# Patient Record
Sex: Female | Born: 1935 | Race: Black or African American | Hispanic: No | Smoking: Never smoker
Health system: Southern US, Community
[De-identification: ages and names within clinical notes are randomized; demographics above are authoritative.]

## PROBLEM LIST (undated history)

## (undated) DIAGNOSIS — J309 Allergic rhinitis, unspecified: Secondary | ICD-10-CM

## (undated) DIAGNOSIS — N390 Urinary tract infection, site not specified: Secondary | ICD-10-CM

## (undated) DIAGNOSIS — D649 Anemia, unspecified: Secondary | ICD-10-CM

## (undated) DIAGNOSIS — R4701 Aphasia: Secondary | ICD-10-CM

## (undated) DIAGNOSIS — I639 Cerebral infarction, unspecified: Secondary | ICD-10-CM

## (undated) DIAGNOSIS — R197 Diarrhea, unspecified: Secondary | ICD-10-CM

## (undated) DIAGNOSIS — M6281 Muscle weakness (generalized): Secondary | ICD-10-CM

## (undated) DIAGNOSIS — R5383 Other fatigue: Secondary | ICD-10-CM

## (undated) DIAGNOSIS — A414 Sepsis due to anaerobes: Secondary | ICD-10-CM

## (undated) DIAGNOSIS — F32A Depression, unspecified: Secondary | ICD-10-CM

## (undated) DIAGNOSIS — I10 Essential (primary) hypertension: Secondary | ICD-10-CM

## (undated) DIAGNOSIS — D493 Neoplasm of unspecified behavior of breast: Secondary | ICD-10-CM

## (undated) DIAGNOSIS — E559 Vitamin D deficiency, unspecified: Secondary | ICD-10-CM

## (undated) DIAGNOSIS — F0280 Dementia in other diseases classified elsewhere without behavioral disturbance: Secondary | ICD-10-CM

## (undated) DIAGNOSIS — R0602 Shortness of breath: Secondary | ICD-10-CM

## (undated) DIAGNOSIS — R7881 Bacteremia: Secondary | ICD-10-CM

## (undated) DIAGNOSIS — F0390 Unspecified dementia without behavioral disturbance: Secondary | ICD-10-CM

## (undated) DIAGNOSIS — Z8673 Personal history of transient ischemic attack (TIA), and cerebral infarction without residual deficits: Secondary | ICD-10-CM

## (undated) DIAGNOSIS — M4628 Osteomyelitis of vertebra, sacral and sacrococcygeal region: Secondary | ICD-10-CM

## (undated) DIAGNOSIS — L89154 Pressure ulcer of sacral region, stage 4: Secondary | ICD-10-CM

## (undated) DIAGNOSIS — Z9289 Personal history of other medical treatment: Secondary | ICD-10-CM

## (undated) DIAGNOSIS — E785 Hyperlipidemia, unspecified: Secondary | ICD-10-CM

## (undated) DIAGNOSIS — I82409 Acute embolism and thrombosis of unspecified deep veins of unspecified lower extremity: Secondary | ICD-10-CM

## (undated) DIAGNOSIS — H409 Unspecified glaucoma: Secondary | ICD-10-CM

## (undated) DIAGNOSIS — E119 Type 2 diabetes mellitus without complications: Secondary | ICD-10-CM

## (undated) DIAGNOSIS — K5900 Constipation, unspecified: Secondary | ICD-10-CM

## (undated) DIAGNOSIS — F329 Major depressive disorder, single episode, unspecified: Secondary | ICD-10-CM

## (undated) DIAGNOSIS — J45909 Unspecified asthma, uncomplicated: Secondary | ICD-10-CM

## (undated) DIAGNOSIS — R5381 Other malaise: Secondary | ICD-10-CM

## (undated) DIAGNOSIS — E669 Obesity, unspecified: Secondary | ICD-10-CM

## (undated) DIAGNOSIS — A0472 Enterocolitis due to Clostridium difficile, not specified as recurrent: Secondary | ICD-10-CM

## (undated) DIAGNOSIS — Z515 Encounter for palliative care: Secondary | ICD-10-CM

## (undated) DIAGNOSIS — D0510 Intraductal carcinoma in situ of unspecified breast: Secondary | ICD-10-CM

## (undated) DIAGNOSIS — C50919 Malignant neoplasm of unspecified site of unspecified female breast: Secondary | ICD-10-CM

## (undated) DIAGNOSIS — J189 Pneumonia, unspecified organism: Secondary | ICD-10-CM

## (undated) DIAGNOSIS — K449 Diaphragmatic hernia without obstruction or gangrene: Secondary | ICD-10-CM

## (undated) DIAGNOSIS — G309 Alzheimer's disease, unspecified: Secondary | ICD-10-CM

## (undated) HISTORY — DX: Personal history of transient ischemic attack (TIA), and cerebral infarction without residual deficits: Z86.73

## (undated) HISTORY — DX: Sepsis due to anaerobes: A41.4

## (undated) HISTORY — DX: Pressure ulcer of sacral region, stage 4: L89.154

## (undated) HISTORY — PX: JOINT REPLACEMENT: SHX530

## (undated) HISTORY — PX: BREAST LUMPECTOMY: SHX2

## (undated) HISTORY — DX: Intraductal carcinoma in situ of unspecified breast: D05.10

## (undated) HISTORY — DX: Enterocolitis due to Clostridium difficile, not specified as recurrent: A04.72

## (undated) HISTORY — DX: Bacteremia: R78.81

---

## 1988-11-14 HISTORY — PX: REPLACEMENT TOTAL KNEE BILATERAL: SUR1225

## 1999-03-17 DIAGNOSIS — D0510 Intraductal carcinoma in situ of unspecified breast: Secondary | ICD-10-CM

## 1999-03-17 HISTORY — DX: Intraductal carcinoma in situ of unspecified breast: D05.10

## 1999-03-25 ENCOUNTER — Encounter: Payer: Self-pay | Admitting: Emergency Medicine

## 1999-03-25 ENCOUNTER — Emergency Department (HOSPITAL_COMMUNITY): Admission: EM | Admit: 1999-03-25 | Discharge: 1999-03-25 | Payer: Self-pay | Admitting: Emergency Medicine

## 1999-03-27 ENCOUNTER — Encounter: Payer: Self-pay | Admitting: Internal Medicine

## 1999-03-27 ENCOUNTER — Encounter: Admission: RE | Admit: 1999-03-27 | Discharge: 1999-03-27 | Payer: Self-pay | Admitting: Internal Medicine

## 1999-03-28 ENCOUNTER — Ambulatory Visit (HOSPITAL_COMMUNITY): Admission: RE | Admit: 1999-03-28 | Discharge: 1999-03-28 | Payer: Self-pay | Admitting: Internal Medicine

## 1999-05-21 ENCOUNTER — Encounter: Payer: Self-pay | Admitting: Internal Medicine

## 1999-05-21 ENCOUNTER — Ambulatory Visit (HOSPITAL_COMMUNITY): Admission: RE | Admit: 1999-05-21 | Discharge: 1999-05-21 | Payer: Self-pay | Admitting: Internal Medicine

## 1999-05-26 ENCOUNTER — Encounter: Payer: Self-pay | Admitting: Internal Medicine

## 1999-05-26 ENCOUNTER — Ambulatory Visit (HOSPITAL_COMMUNITY): Admission: RE | Admit: 1999-05-26 | Discharge: 1999-05-26 | Payer: Self-pay | Admitting: Internal Medicine

## 1999-05-26 ENCOUNTER — Encounter (INDEPENDENT_AMBULATORY_CARE_PROVIDER_SITE_OTHER): Payer: Self-pay | Admitting: Specialist

## 1999-06-12 ENCOUNTER — Encounter: Payer: Self-pay | Admitting: Surgery

## 1999-06-13 ENCOUNTER — Ambulatory Visit (HOSPITAL_COMMUNITY): Admission: RE | Admit: 1999-06-13 | Discharge: 1999-06-14 | Payer: Self-pay | Admitting: Surgery

## 1999-06-13 ENCOUNTER — Encounter: Payer: Self-pay | Admitting: Surgery

## 1999-07-01 ENCOUNTER — Encounter: Admission: RE | Admit: 1999-07-01 | Discharge: 1999-09-29 | Payer: Self-pay | Admitting: Radiation Oncology

## 1999-12-31 ENCOUNTER — Encounter: Admission: RE | Admit: 1999-12-31 | Discharge: 1999-12-31 | Payer: Self-pay | Admitting: Plastic Surgery

## 1999-12-31 ENCOUNTER — Encounter: Payer: Self-pay | Admitting: Plastic Surgery

## 2000-02-19 ENCOUNTER — Encounter (INDEPENDENT_AMBULATORY_CARE_PROVIDER_SITE_OTHER): Payer: Self-pay | Admitting: *Deleted

## 2000-02-19 ENCOUNTER — Ambulatory Visit (HOSPITAL_BASED_OUTPATIENT_CLINIC_OR_DEPARTMENT_OTHER): Admission: RE | Admit: 2000-02-19 | Discharge: 2000-02-19 | Payer: Self-pay | Admitting: Plastic Surgery

## 2000-04-12 ENCOUNTER — Encounter: Payer: Self-pay | Admitting: Plastic Surgery

## 2000-04-12 ENCOUNTER — Encounter: Admission: RE | Admit: 2000-04-12 | Discharge: 2000-04-12 | Payer: Self-pay | Admitting: Plastic Surgery

## 2000-12-28 ENCOUNTER — Encounter: Payer: Self-pay | Admitting: Emergency Medicine

## 2000-12-28 ENCOUNTER — Emergency Department (HOSPITAL_COMMUNITY): Admission: EM | Admit: 2000-12-28 | Discharge: 2000-12-28 | Payer: Self-pay | Admitting: Emergency Medicine

## 2000-12-31 ENCOUNTER — Encounter: Admission: RE | Admit: 2000-12-31 | Discharge: 2000-12-31 | Payer: Self-pay | Admitting: Internal Medicine

## 2000-12-31 ENCOUNTER — Encounter: Payer: Self-pay | Admitting: Internal Medicine

## 2001-01-04 ENCOUNTER — Encounter: Admission: RE | Admit: 2001-01-04 | Discharge: 2001-01-04 | Payer: Self-pay | Admitting: Internal Medicine

## 2001-01-04 ENCOUNTER — Encounter: Payer: Self-pay | Admitting: Internal Medicine

## 2001-01-21 ENCOUNTER — Encounter: Admission: RE | Admit: 2001-01-21 | Discharge: 2001-04-21 | Payer: Self-pay | Admitting: Internal Medicine

## 2002-08-12 ENCOUNTER — Inpatient Hospital Stay (HOSPITAL_COMMUNITY): Admission: EM | Admit: 2002-08-12 | Discharge: 2002-08-21 | Payer: Self-pay | Admitting: Emergency Medicine

## 2002-08-12 ENCOUNTER — Encounter: Payer: Self-pay | Admitting: Emergency Medicine

## 2002-08-13 ENCOUNTER — Encounter: Payer: Self-pay | Admitting: Internal Medicine

## 2002-08-14 ENCOUNTER — Encounter (INDEPENDENT_AMBULATORY_CARE_PROVIDER_SITE_OTHER): Payer: Self-pay | Admitting: *Deleted

## 2003-06-05 ENCOUNTER — Encounter: Admission: RE | Admit: 2003-06-05 | Discharge: 2003-09-03 | Payer: Self-pay | Admitting: Internal Medicine

## 2003-07-24 ENCOUNTER — Encounter: Admission: RE | Admit: 2003-07-24 | Discharge: 2003-07-24 | Payer: Self-pay | Admitting: Internal Medicine

## 2003-08-21 ENCOUNTER — Encounter (INDEPENDENT_AMBULATORY_CARE_PROVIDER_SITE_OTHER): Payer: Self-pay | Admitting: Radiology

## 2003-08-21 ENCOUNTER — Encounter (INDEPENDENT_AMBULATORY_CARE_PROVIDER_SITE_OTHER): Payer: Self-pay | Admitting: *Deleted

## 2003-08-21 ENCOUNTER — Encounter: Admission: RE | Admit: 2003-08-21 | Discharge: 2003-08-21 | Payer: Self-pay | Admitting: Internal Medicine

## 2003-10-04 ENCOUNTER — Ambulatory Visit (HOSPITAL_COMMUNITY): Admission: RE | Admit: 2003-10-04 | Discharge: 2003-10-05 | Payer: Self-pay | Admitting: Surgery

## 2003-10-04 ENCOUNTER — Encounter (INDEPENDENT_AMBULATORY_CARE_PROVIDER_SITE_OTHER): Payer: Self-pay | Admitting: *Deleted

## 2003-10-04 ENCOUNTER — Encounter: Admission: RE | Admit: 2003-10-04 | Discharge: 2003-10-04 | Payer: Self-pay | Admitting: Surgery

## 2003-12-07 ENCOUNTER — Ambulatory Visit: Admission: RE | Admit: 2003-12-07 | Discharge: 2004-02-17 | Payer: Self-pay | Admitting: Radiation Oncology

## 2004-03-06 ENCOUNTER — Ambulatory Visit: Admission: RE | Admit: 2004-03-06 | Discharge: 2004-03-06 | Payer: Self-pay | Admitting: Radiation Oncology

## 2004-04-10 ENCOUNTER — Ambulatory Visit: Admission: RE | Admit: 2004-04-10 | Discharge: 2004-04-10 | Payer: Self-pay | Admitting: Radiation Oncology

## 2004-04-15 ENCOUNTER — Ambulatory Visit: Payer: Self-pay | Admitting: Oncology

## 2004-07-25 ENCOUNTER — Encounter: Admission: RE | Admit: 2004-07-25 | Discharge: 2004-07-25 | Payer: Self-pay | Admitting: Oncology

## 2004-08-06 ENCOUNTER — Encounter: Admission: RE | Admit: 2004-08-06 | Discharge: 2004-08-06 | Payer: Self-pay | Admitting: Internal Medicine

## 2004-11-03 ENCOUNTER — Ambulatory Visit: Payer: Self-pay | Admitting: Oncology

## 2004-11-26 ENCOUNTER — Emergency Department (HOSPITAL_COMMUNITY): Admission: EM | Admit: 2004-11-26 | Discharge: 2004-11-26 | Payer: Self-pay | Admitting: Emergency Medicine

## 2005-05-05 ENCOUNTER — Ambulatory Visit: Payer: Self-pay | Admitting: Oncology

## 2005-07-27 ENCOUNTER — Encounter: Admission: RE | Admit: 2005-07-27 | Discharge: 2005-07-27 | Payer: Self-pay | Admitting: Oncology

## 2005-10-29 ENCOUNTER — Ambulatory Visit: Payer: Self-pay | Admitting: Oncology

## 2005-11-04 LAB — CBC WITH DIFFERENTIAL/PLATELET
BASO%: 0.8 % (ref 0.0–2.0)
EOS%: 3.9 % (ref 0.0–7.0)
MCH: 27.2 pg (ref 26.0–34.0)
MCHC: 32.3 g/dL (ref 32.0–36.0)
MONO%: 8.8 % (ref 0.0–13.0)
RDW: 15.4 % — ABNORMAL HIGH (ref 11.3–14.5)
lymph#: 1.7 10*3/uL (ref 0.9–3.3)

## 2005-11-04 LAB — COMPREHENSIVE METABOLIC PANEL
ALT: 24 U/L (ref 0–40)
AST: 21 U/L (ref 0–37)
Alkaline Phosphatase: 79 U/L (ref 39–117)
Calcium: 9.1 mg/dL (ref 8.4–10.5)
Chloride: 107 mEq/L (ref 96–112)
Creatinine, Ser: 1.01 mg/dL (ref 0.40–1.20)
Potassium: 4.9 mEq/L (ref 3.5–5.3)

## 2005-11-13 ENCOUNTER — Encounter: Admission: RE | Admit: 2005-11-13 | Discharge: 2005-11-13 | Payer: Self-pay | Admitting: Internal Medicine

## 2006-02-08 ENCOUNTER — Encounter: Admission: RE | Admit: 2006-02-08 | Discharge: 2006-02-08 | Payer: Self-pay | Admitting: Orthopedic Surgery

## 2006-04-30 ENCOUNTER — Ambulatory Visit: Payer: Self-pay | Admitting: Oncology

## 2006-06-24 ENCOUNTER — Ambulatory Visit: Payer: Self-pay | Admitting: Oncology

## 2006-06-25 LAB — COMPREHENSIVE METABOLIC PANEL
ALT: 15 U/L (ref 0–35)
AST: 16 U/L (ref 0–37)
Alkaline Phosphatase: 79 U/L (ref 39–117)
BUN: 39 mg/dL — ABNORMAL HIGH (ref 6–23)
Chloride: 108 mEq/L (ref 96–112)
Creatinine, Ser: 1.23 mg/dL — ABNORMAL HIGH (ref 0.40–1.20)
Total Bilirubin: 0.4 mg/dL (ref 0.3–1.2)

## 2006-06-25 LAB — CBC WITH DIFFERENTIAL/PLATELET
BASO%: 0.7 % (ref 0.0–2.0)
Basophils Absolute: 0 10*3/uL (ref 0.0–0.1)
EOS%: 1.9 % (ref 0.0–7.0)
HCT: 35.9 % (ref 34.8–46.6)
LYMPH%: 28.8 % (ref 14.0–48.0)
MCH: 27.8 pg (ref 26.0–34.0)
MCHC: 33.1 g/dL (ref 32.0–36.0)
MCV: 83.8 fL (ref 81.0–101.0)
MONO%: 9.8 % (ref 0.0–13.0)
NEUT%: 58.8 % (ref 39.6–76.8)
lymph#: 1.3 10*3/uL (ref 0.9–3.3)

## 2006-07-02 ENCOUNTER — Inpatient Hospital Stay (HOSPITAL_COMMUNITY): Admission: EM | Admit: 2006-07-02 | Discharge: 2006-07-14 | Payer: Self-pay | Admitting: Emergency Medicine

## 2006-07-02 ENCOUNTER — Encounter (INDEPENDENT_AMBULATORY_CARE_PROVIDER_SITE_OTHER): Payer: Self-pay | Admitting: Cardiology

## 2006-07-05 ENCOUNTER — Ambulatory Visit: Payer: Self-pay | Admitting: Vascular Surgery

## 2006-07-05 ENCOUNTER — Encounter: Payer: Self-pay | Admitting: Vascular Surgery

## 2006-07-06 ENCOUNTER — Encounter (INDEPENDENT_AMBULATORY_CARE_PROVIDER_SITE_OTHER): Payer: Self-pay | Admitting: Interventional Cardiology

## 2006-07-08 ENCOUNTER — Ambulatory Visit: Payer: Self-pay | Admitting: Physical Medicine & Rehabilitation

## 2006-07-24 ENCOUNTER — Inpatient Hospital Stay (HOSPITAL_COMMUNITY): Admission: EM | Admit: 2006-07-24 | Discharge: 2006-07-27 | Payer: Self-pay | Admitting: Emergency Medicine

## 2006-10-20 ENCOUNTER — Encounter: Admission: RE | Admit: 2006-10-20 | Discharge: 2006-10-20 | Payer: Self-pay | Admitting: Internal Medicine

## 2006-12-08 ENCOUNTER — Encounter: Admission: RE | Admit: 2006-12-08 | Discharge: 2006-12-08 | Payer: Self-pay | Admitting: Internal Medicine

## 2006-12-11 ENCOUNTER — Emergency Department (HOSPITAL_COMMUNITY): Admission: EM | Admit: 2006-12-11 | Discharge: 2006-12-11 | Payer: Self-pay | Admitting: Emergency Medicine

## 2006-12-21 ENCOUNTER — Ambulatory Visit: Payer: Self-pay | Admitting: Oncology

## 2007-07-26 ENCOUNTER — Encounter: Admission: RE | Admit: 2007-07-26 | Discharge: 2007-07-26 | Payer: Self-pay | Admitting: Internal Medicine

## 2007-09-06 ENCOUNTER — Ambulatory Visit (HOSPITAL_COMMUNITY): Admission: RE | Admit: 2007-09-06 | Discharge: 2007-09-06 | Payer: Self-pay | Admitting: Internal Medicine

## 2007-12-13 ENCOUNTER — Encounter: Admission: RE | Admit: 2007-12-13 | Discharge: 2007-12-13 | Payer: Self-pay | Admitting: Internal Medicine

## 2009-03-02 ENCOUNTER — Emergency Department (HOSPITAL_COMMUNITY): Admission: EM | Admit: 2009-03-02 | Discharge: 2009-03-03 | Payer: Self-pay | Admitting: Emergency Medicine

## 2009-10-31 ENCOUNTER — Encounter: Admission: RE | Admit: 2009-10-31 | Discharge: 2009-10-31 | Payer: Self-pay | Admitting: Internal Medicine

## 2010-04-06 ENCOUNTER — Encounter: Payer: Self-pay | Admitting: Internal Medicine

## 2010-04-06 ENCOUNTER — Encounter: Payer: Self-pay | Admitting: Oncology

## 2010-04-06 ENCOUNTER — Encounter (HOSPITAL_BASED_OUTPATIENT_CLINIC_OR_DEPARTMENT_OTHER): Payer: Self-pay | Admitting: Internal Medicine

## 2010-04-07 ENCOUNTER — Encounter (HOSPITAL_BASED_OUTPATIENT_CLINIC_OR_DEPARTMENT_OTHER): Payer: Self-pay | Admitting: Internal Medicine

## 2010-07-29 NOTE — Discharge Summary (Signed)
[name], HODGKIN NO.:  [id number]   MEDICAL RECORD NO.:  [id number]            PATIENT TYPE:   LOCATION:                                 FACILITY:   PHYSICIAN:  Candyce Churn, M.D.  DATE OF BIRTH:   DATE OF ADMISSION:  07/02/2006  DATE OF DISCHARGE:  07/13/2006                               DISCHARGE SUMMARY   DISCHARGE DIAGNOSES:  1. Multifocal left hemispheric stroke.  2. Multi-infarct dementia secondary to #1.  3. History of a large left posterior temporal parietal stroke with      expressive aphasia and moderate weakness in the right extremities      which has worsened with the current stroke.  4. Hypertension.  5. Type 2 diabetes.  6. Obesity.  7. Remote history of breast cancer.  8. Asthma.  9. Gait disorder, even prior to strokes.  10.History of breast cancer.  11.Second degree arteriovenous heart block secondary to calcium      channel blockers which resolved with discontinuation of verapamil.  12.History of latent syphilis, treated in 2006 with 3 doses of 1.2      million units of benzathine penicillin with reduction in RPR from      1:8 to 1:2.  TPPA was reactive.  Felt to be adequately treated for      the possibility of neurosyphilis.   DISCHARGE MEDICATIONS:  1. Hydrochlorothiazide 25 mg daily.  2. Metformin 1 g b.i.d.  3. Simvastatin 40 mg daily.  4. Detrol LA 4 mg daily.  5. Lisinopril 40 mg daily.  6. Singulair 10 mg daily.  7. Catapres Patch 0.1 mg per 24 hours placed topically and changed      weekly.  8. Aricept 5 mg p.o. nightly.  9. Prozac 40 mg p.o. q.a.m.  10.Lantus insulin 15 units subcu nightly.  11.MiraLax 70 g powder dissolved in 8 ounces of water daily.  12.Senokot-S 1 p.o. b.i.d.  13.Aspirin 81 mg daily for 1 week, then discontinue.  14.Tylenol 650 mg p.o. q.4 hours p.r.n.  15.Aggrenox Capsule SA, do not crush, cut or chew, 1 capsule p.o.      daily through Jul 20, 2006, then increase to 1 capsule p.o.  b.i.d.,      and discontinue aspirin 81 mg a day on Jul 19, 2006.  16.Vitamin B12.  17.Foltx 1 tablet daily.  18.Vicodin 10/325 one p.o. q.4 hours p.r.n.   CONSULTATIONS WHILE ADMITTED:  1. Neurology, Dr. Delia Heady, recommended discontinuing Plavix and      changing to Aggrenox.  2. Cardiology, Dr. Armanda Magic, consulted for second degree AV block      which resolved with discontinuation of verapamil therapy.  3. Dr. Antonietta Breach, psychiatry, assessed Ms. Petkus for mental      competency, and felt that she likely had dementia due to      cerebrovascular disease, and that she demonstrated severe      impairment in judgment and memory, and did not have the capacity      for informed consent.   PROCEDURE:  1. MRI of the  brain without contrast July 02, 2006 revealed:      a.     Multifocal areas of acute left hemispheric infarction.      b.     Large remote left temporoparietal occipital cortical       infarct.      c.     Extensive atrophy and small vessel disease.  2. CT of the head without contrast on July 02, 2006:  remote left      parietal occipital and right frontal infarcts.  No acute      intracranial abnormality.  3. Carotid duplex studies performed on July 05, 2006 revealed no      significant right or left internal carotid artery stenosis.  Left      vertebral artery not visualized.  Right vertebral artery flow was      antegrade.  4. Transthoracic echocardiogram revealed mild to moderate aortic      stenosis with aortic valvular area 1.5 sq cm to 1.7 sq cm.      Ejection fraction was 60%.   HOSPITAL COURSE:  [name] is a pleasant 76 year old female whom  I have followed for many years.  She suffered a left temporoparietal  stroke in 2004 and was maintained on Plavix.  While on Plavix she  unfortunately suffered a multifocal left hemispheric infarct on July 01, 2006.  She was admitted to [facility] and seen in  consultation by the above  consultants, and has stabilized into the  current clinical condition of moderate dementia with significant  aphasia.  She has a rather severe right hemiparesis and is  nonambulatory.  She is requiring skilled nursing facility placement.   While hospitalized she experienced the following issues:  1. CVA:  Seen in consultation by Dr. Delia Heady who recommended a      combination of aspirin and Aggrenox therapy, transition over to      Aggrenox b.i.d. after a total of 2 weeks of combination therapy.      She had no further neurologic insults while hospitalized, but will      require total care in a skilled nursing facility.  2. Competency status:  The patient was deemed incompetent to make      decisions for herself by consultation from Dr. Antonietta Breach.      She has marked problems with memory and was only able to remember      my name at the last day of hospitalization, though I have know her      for years.  Speech is clear, but word-finding is difficult, and she      is unaware of the day, date and where she actually is.  She is      alert, and hopefully this will improve over time.  3. Hypertension, controlled.  4. Type 2 diabetes, controlled.  5. Asthma, controlled.  6. History of remote breast cancer aware.  7. History of latent syphilis, treated as above.  Should be treated      adequately for a possibility of neurosyphilis.  8. Second degree AV block:  The was noted to be in second degree AV      block on cardiac monitor, but noted also to be on calcium channel-      blockers and when these were stopped, her second degree AV block      resolved, and now she is in sinus rhythm with first degree AV      block.  She should not be placed on beta-blockers or calcium      channel-blockers if at all possible.   CONDITION ON DISCHARGE:  Improved, but only slightly neurologically, and  she will need extensive assistance with all ADLs.   Please bring a copy of this up to 6707 for  discharge later today.   DISCHARGE LABORATORY DATA:  Homocysteine level on July 07, 2006 of  17.8.  CBC on July 06, 2006 is white count 6400, hemoglobin 10.9,  platelet count 241,000.  Electrolytes from July 05, 2006:  sodium 136,  potassium 4.3, chloride 104, bicarb 23, glucose 160, BUN 32, creatinine  1.07.  Liver function tests from July 01, 2006 reveal total protein  6.4, albumin 3.1, AST 18, ALT 18, alk phos 68, total bili 0.7.  Hemoglobin A1c from July 02, 2006 was 5.9%, excellent.  Serial cardiac  enzymes on July 02, 2006 revealed a CK 194, 168, 146 with CK-MB of 4,  3.1 and 2, and troponin I of 0.14, 0.11 and 0.12.   Cholesterol profile on July 07, 2006 revealed cholesterol 111, HDL 33,  triglycerides 113, LDL 55.   TSH on July 02, 2006 was 0.939, and B12 level on July 02, 2006 was  446.  Urinalysis from July 05, 2006 revealed 7 to 10 white cells, 3 to  6 red cells, many bacteria, but she had no growth on culture.  RPR titer  on July 02, 2006 was 1:2, as mentioned above.   Again, please bring a copy up to 6707 for discharge.      Candyce Churn, M.D.  Electronically Signed     RNG/MEDQ  D:  07/13/2006  T:  07/13/2006  Job:  [id number]

## 2010-07-29 NOTE — Discharge Summary (Signed)
NAMETERRILL, WAUTERS NO.:  [id number]   MEDICAL RECORD NO.:  [id number]          PATIENT TYPE:  INP   LOCATION:  1522                         FACILITY:  [facility]   PHYSICIAN:  Candyce Churn, M.D.DATE OF BIRTH:  1934-08-21   DATE OF ADMISSION:  07/23/2006  DATE OF DISCHARGE:  07/27/2006                               DISCHARGE SUMMARY   DISCHARGE DIAGNOSES:  1. Klebsiella urosepsis, resolved.  2. History of multi-infarct dementia.  3. History of prior strokes.  4. Hypertension.  5. Type 2 diabetes mellitus.  6. Morbid obesity.  7. Remote history of breast cancer.  8. History of asthma.  9. Gait disorder; severe, secondary to strokes.  10.History of large left posterior temporoparietal stroke with      expressive aphasia with moderate weakness of right extremities      which worsened on stroke in April 2008.  11.History of second-degree AV heart block secondary to calcium      channel blockers, resolved with discontinuation of verapamil during      hospitalization in April 2008.  12.History of latent syphilis treated in 2006 with three doses of 1.2      million units of intravenous penicillin with a reduction in RPR      from 1.8 to 1.2.  TPPA was reactive.   DISCHARGE MEDICATIONS:  1. Aggrenox one p.o. b.i.d.  2. Vitamin C 500 mg p.o. b.i.d.  3. Zinc sulfate 220 mg daily.  4. Fluoxetine 40 mg daily.  5. Foltx one tab p.o. daily.  6. Ditropan XL 10 mg p.o. daily.  7. Singulair 10 mg daily.  8. Aricept 5 mg at night.  9. Protonix 40 mg daily or omeprazole 20 mg b.i.d.  10.Senokot S one p.o. nightly.  11.MiraLax 17 gm in 8 ounces of water daily - hold for diarrhea.  12.Metformin 1 gm b.i.d.  13.Simvastatin 40 mg daily.  14.Cipro b.i.d. for ten days.   HOSPITAL COURSE:  [name] is a very pleasant 76 year old female  who was recently admitted to [facility] on July 13, 2006 after  an eleven day hospitalization for large left  posterior temporoparietal  stroke.  She was admitted to [facility] on July 13, 2006 and  became lethargic with decreased responsiveness on Jul 19, 2006 and was  found to have pyuria and bacteruria on urinalysis in the [facility]  Emergency Room.  On Jul 19, 2006 was admitted for what was felt to be  worsening altered mental status secondary to urosepsis.   She was hypotensive on admission but responded to vigorous IV hydration.  She was started on IV Ciprofloxacin and had marked recovery.  She has  not re-developed elevated blood pressures though she is off her  lisinopril and Catapres patch currently.   She had been on lisinopril 40 mg and Catapres patch 1 mg per hour placed  topically weekly prior to admission.   She is now alert but still chronically confused.  Able to converse at  baseline though still cannot remember my name until reminded.   Her urine culture  grew out Klebsiella pneumoniae sensitive to Cipro at  less than 0.25 and she will continue ten more days of Ciprofloxacin 500  mg b.i.d.   LABORATORIES WHILE ADMITTED:  White count on admission was 24,000 and by  Jul 26, 2006 was 9100.  Hemoglobin on admission was 11.7, hemoglobin at  discharge was 9.7.  Discharge electrolytes were sodium 145, potassium 4,  chloride 116, bicarb 21, BUN 37, creatinine 0.96, glucose 119.  LFT's on  Jul 25, 2006 reveal AST of 27, ALT 76, alk phos 99, total bili 0.4,  albumin 2, calcium 9.7.  Blood cultures from Jul 23, 2006 revealed no  growth on Jul 27, 2006.  Wound culture from her small sacral decubitus  revealed no Staph aureus or group A Strep.  No organisms predominated.  Urine culture from Jul 23, 2006 revealed Klebsiella pneumoniae with  multiple antibiotic sensitivities including Ciprofloxacin.  BNP from Jul 23, 2006 was 186.  Chest x-ray from Jul 23, 2006 revealed stable enlarged  cardiac silhouette with mild peribronchial thickening; no acute  abnormality.   The patient  will be discharged back to [facility] in improved  condition.      Candyce Churn, M.D.  Electronically Signed     RNG/MEDQ  D:  07/27/2006  T:  07/27/2006  Job:  [id number]

## 2010-07-29 NOTE — H&P (Signed)
[name], HART NO.:  [id number]   MEDICAL RECORD NO.:  [id number]          PATIENT TYPE:  INP   LOCATION:  1522                         FACILITY:  [facility]   PHYSICIAN:  Della Goo, M.D. DATE OF BIRTH:  16-Nov-1934   DATE OF ADMISSION:  07/23/2006  DATE OF DISCHARGE:                              HISTORY & PHYSICAL   PRIMARY CARE PHYSICIAN:  Maxwell Caul, M.D., [facility]   CHIEF COMPLAINT:  Altered mental status.   HISTORY OF PRESENT ILLNESS:  This is a 76 year old female resident of  [name] who was brought to the emergency department after  having increased lethargy and decreased responsiveness over the past 24  hours.  She was found to have a fever when she was evaluated in the  emergency department. Further studies reveal that she also had a urinary  tract infection.  History is obtained at the bedside with the patient's  daughter. The patient is awake answering few questions, but the history  is per the patient's daughter..  Per the daughter, the patient has not  had any nausea or vomiting.  The patient herself denies having any  shortness of breath or any pain at this time.   PAST MEDICAL HISTORY:  1. The patient has had a total of three cerebrovascular accidents.  2. She is a history of hypertension.  3. Type 2 diabetes mellitus.   MEDICATIONS:  Her medication list will be appended   ALLERGIES:  No known drug allergies.   SOCIAL HISTORY:  Nursing home resident.  The patient recently became a  resident at [name].  She is a nonsmoker, nondrinker.   FAMILY HISTORY:  Noncontributory.   REVIEW OF SYSTEMS:  Pertinent for mentioned above.   PHYSICAL EXAMINATION:  GENERAL:  This is a 76 year old morbidly obese  female in no acute distress.  VITAL SIGNS:  Temperature of 102.9, blood pressure 113/53, heart rate  75, respiratory rate 20, O2 saturations to 95-96%.  HEENT:  Normocephalic, atraumatic.   Pupils equally round and react to  light.  Extraocular muscles are intact. Funduscopic benign. Oropharynx  is clear.  NECK:  Supple with full range of motion.  No thyromegaly, adenopathy,  jugular venous distension.  CARDIOVASCULAR:  Regular rate and rhythm.  LUNGS:  Clear to auscultation bilaterally.  ABDOMEN:  Positive bowel sounds, soft, nontender, nondistended.  EXTREMITIES: Without cyanosis, clubbing or edema.  NEUROLOGIC:  On examination, the patient is alert but pleasantly  confused.  The patient has mild left-sided hemiparesis.   ASSESSMENT:  A 76 year old female being admitted with:  1. Altered mental status.  2. Acute urinary tract infection, urosepsis.  3. Type 2 diabetes mellitus.   PLAN:  The patient has been placed on IV antibiotic therapy of  ciprofloxacin, and IV fluids have been ordered.  The patient will  continue on her regular medications, but hold parameters have been  written for her blood pressure medications.  GI and DVT prophylaxis have  also been ordered.      Della Goo, M.D.  Electronically Signed  HJ/MEDQ  D:  07/24/2006  T:  07/25/2006  Job:  [id number]   cc:   Maxwell Caul, M.D.

## 2010-08-01 NOTE — Op Note (Signed)
[name], [name]                            ACCOUNT NO.:  [id number]   MEDICAL RECORD NO.:  [id number]                   PATIENT TYPE:  INP   LOCATION:  3008                                 FACILITY:  [name]   PHYSICIAN:  Meade Maw, M.D.                 DATE OF BIRTH:  [birth year]   DATE OF PROCEDURE:  DATE OF DISCHARGE:                                 OPERATIVE REPORT   REFERRING PHYSICIAN:  Candyce Churn, M.D.   INDICATION FOR PROCEDURE:  Evaluation for cardiac source of embolus.   PROCEDURE:  After obtaining written informed consent from the patient's  power of attorney, she was brought to the endoscopy lab in a postabsorptive  state.  Preop sedation was achieved using Cetacaine spray and viscous  lidocaine.  IV sedation was achieved using 7 mg of Versed and 100 mcg IV  fentanyl.  Following appropriate sedation, the OmniPlane probe was  introduced using digital guidance without difficulty.  Multiple views were  obtained at the midesophageal, basal, and deep gastric view.  The deep  gastric view was somewhat suboptimal.  The procedure was somewhat hampered  by the patient's ongoing cough throughout the procedure.  Bubble study was  performed.  The left atrial appendage was visualized.  The ascending and  descending aortic arch was then visualized.   FINDINGS:  There was mild thickening of the anterior and posterior mitral  valve leaflet.  There was trivial mitral regurgitation noted.  There was  mild aortic sclerosis, which was most prominent over the left coronary cusp.  There was normal aortic valve velocity.  There was trivial aortic  insufficiency.  The tricuspid valve was normal.  There was trivial tricuspid  regurgitation.  The peak velocity recorded was 3 M/sec. with a calculated  right ventricular pressure of approximately 46 mmHg.  The left ventricle was  normal in dimension, size, and function.  The right ventricle was grossly  normal.  The pulmonic valve  was grossly normal.  The left atrial appendage  was normal.  There was 80 cm of velocity in the left atrial appendage.  There was negative bubble study.  The ascending and descending arch was  grossly within normal limits.  There was some difficulty with visualizing  the aorta.   FINAL IMPRESSION:  1. No cardiac source of embolus was identified.  2. Normal left ventricular dimension with preserved systolic function.                                               Meade Maw, M.D.    HP/MEDQ  D:  08/17/2002  T:  08/17/2002  Job:  [id number]   cc:   Candyce Churn, M.D.  [address] [place]  [place] [address]  Fax: [phone]

## 2010-08-01 NOTE — H&P (Signed)
[name], [name] NO.:  [id number]   MEDICAL RECORD NO.:  [id number]          PATIENT TYPE:  INP   LOCATION:  1826                         FACILITY:  [name]   PHYSICIAN:  Hollice Espy, M.D.DATE OF BIRTH:  [birth year]   DATE OF ADMISSION:  07/01/2006  DATE OF DISCHARGE:                              HISTORY & PHYSICAL   ATTENDING PHYSICIAN:  Candyce Churn, M.D.   CHIEF COMPLAINT:  Confusion and weakness.   HISTORY OF PRESENT ILLNESS:  Patient is a 76 year old African-American  female who currently resides at home and attends a senior citizen's  class.  Her history is extremely spotty, and she and her sister are both  poor historians, as far as the events of what has exactly happened.  Apparently, she was attending her senior citizen's class today when she  started having problems with severe weakness and unable to stand.  She  called her sister, who came to see her.  Apparently, the patient had  problems with urinary incontinence and confusion and was brought into  the emergency room.  The emergency room labs were checked.  The patient  herself was not, again, able to pass on much of a story.  She is noted  to be slightly bradycardic with first-degree AV block with a heart rate  in the 50s.  The rest of her labs noted no signs of infection with  normal white count and normal shift with a slightly elevated BUN and  creatinine, around a BUN of 30 and a creatinine of 1.3.  The rest of her  labs were essentially unremarkable except for an albumin of 3.1.   Given the patient's weakness, sudden confusion, there was a concern that  she may have had a possible CVA, although she has no other focal  neurological findings.  I have ordered a CT scan which is pending.  The  patient currently tells me she has been okay.  While she is not great at  giving me a review of systems, she tells me she has no headache, no  visual changes, no trouble swallowing, no  chest pain, no palpitations,  no shortness of breath, no abdominal pain, no focal extremity numbness,  weakness, or pain, no diarrhea, constipation, hematuria, or dysuria, but  she feels some generalized weakness.  Review of systems is otherwise  negative.   Patient's past medical history includes diabetes, hypertension, obesity,  chronic back pain, asthma, arthritis, history of stroke in the past.   MEDICATIONS:  We do not have the pill bottles or dosages, but we know  the patient is on hydrocodone, NSAIDs, Metformin, Advair, Celebrex,  Plavix, lisinopril, and HCTZ.   ALLERGIES:  She has no known drug allergies.   SOCIAL HISTORY:  She currently resides at home, although when asked for  her address, she tells me a completely different address, which her  sister says she has not lived there for years.   FAMILY HISTORY:  Noncontributory.   There is no tobacco, alcohol, or drug use.   PHYSICAL EXAMINATION:  VITALS ON ADMISSION:  Temperature 99, heart  rate  54, blood pressure 127/70, respirations 28, O2 sat 96% on 3 liters.  GENERAL:  Patient is alert.  She is oriented x2.  HEENT:  Normocephalic and atraumatic.  Her mucous membranes are slightly  dry.  She has no carotid bruits.  HEART:  An irregular rhythm but it is rate-controlled.  LUNGS:  Clear to auscultation bilaterally, limited somewhat to body  habitus.  ABDOMEN:  Soft, obese, nontender.  Positive bowel sounds.  EXTREMITIES:  No clubbing or cyanosis.  Trace pitting edema.  NEUROLOGIC:  She has no focal neurological findings.  Her grip strength,  flexion and extension are all about a 5 to a -5/5 but symmetric.  No  focal abnormalities.  Cranial nerves II-XII are all intact.  She has no  evidence of any pronator drift, and she has no initial cerebellar  dysfunction and no Babinski's signs.   LAB WORK:  White count 7, H&H 11.8 and 36.1, MCV 84, platelet count 217,  no shift.  Sodium 136, potassium 4.5, chloride 105, bicarb  22, BUN 30,  creatinine 1.2, glucose 118.  LFTs are noted for an albumin just at 3.1.  Her UA only shows 0-2 white and red cells with rare bacteria.   ASSESSMENT/PLAN:  1. Acute confusion, although appears to be less of a delirium state.      Obviously, with her previous history of cerebrovascular accident,      we certainly need to rule this out.  We will check a noncontrast      head CT.  2. Acute renal failure:  Likely, this is mild dehydration.  Will      gently hydrate and see if this decreases some of her symptoms.  3. Weakness:  We will get PT/OT consult.  4. Uterine prolapse:  I neglected to mention this.  She apparently has      had this before in the past.  This appears to have occurred once      again.  She has an OB/GYN doctor.  We will plan to discuss this      patient with the OB/GYN some time tomorrow for a consult for      assistance.  5. Diabetes mellitus:  We will put her on a sliding scale and get her      medications.  6. Hypertension:  Continue to follow.      Hollice Espy, M.D.  Electronically Signed     SKK/MEDQ  D:  07/02/2006  T:  07/02/2006  Job:  [id number]   cc:   Candyce Churn, M.D.

## 2010-08-01 NOTE — Consult Note (Signed)
NAME:  [name], [name]                             ACCOUNT NO.:  [id number]   MEDICAL RECORD NO.:  [id number]                  PATIENT TYPE:  INP   LOCATION:                                       FACILITY:  [name]   PHYSICIAN:  Meade Maw, M.D.                 DATE OF BIRTH:   DATE OF CONSULTATION:  DATE OF DISCHARGE:                                   CONSULTATION   REASON FOR CONSULTATION:  Evaluation for cardiac source of embolus.   HISTORY OF PRESENT ILLNESS:  [name] is a 76 year old [name] female with a past  medical history of hypertension, diabetes, and breast cancer who presented  to the emergency room on Aug 12, 2002, with confusion.  Her sister was  called at 8 p.m. and told that the patient was incoherent.  The patient was  unable to remember her name or respond appropriately.  She had been treated  previously for TIAs there was questionable noncompliance with medications.   PAST MEDICAL HISTORY:  This is also significant for right partial mastectomy  and right bilateral total knee replacement.   MEDICATIONS PRIOR TO ADMISSION:  1. Glucophage 1 g b.i.d.  2. Prinizide 25 mg q.i.d.  3. Glyburide 5 mg b.i.d.  4. Celebrex 200 mg b.i.d.  5. Calan 240 mg b.i.d.  6. Plavix 75 mg daily.  7. Aspirin one daily, unsure if the patient was taking prior to admission.   HISTORY OF PRESENT ILLNESS:  She was noted to be in a normal sinus rhythm  upon admission.  She has had no arrhythmias documented.  Neurology was  consulted.  She was noted to have an ischemic left brain infarction in the  posterior temporal parietal region.  She subsequently had a transthoracic  echo which was suboptimal.  She was felt to have normal LV size with  ejection fraction at lower limits of normal.  We were unable to evaluate for  regional wall motion abnormalities.  Aortic valve was probably trileaflet  with mild increase in aortic valve thickness.  There was moderate to marked  annular calcification.   There was no obvious cardiac source of embolus but  was unable to eliminate by transthoracic echo.   LABORATORY DATA:  The laboratory data obtained while in the hospital reveals  electrolytes on August 15, 2002, sodium 139, potassium 4.2, glucose elevated at  151 with creatinine of 0.9.  White count has been normal, hematocrit 40.2.  She has normal liver enzymes.  Her homocystine level was 11.  She currently  was on Lovenox, aspirin and Plavix.   Her carotid Dopplers were unremarkable.   She has remained hemodynamically stable with a blood pressure of 130/74,  heart rate in the 70s, O2 saturations are 97% on room air.  She has been  able to swallow without difficulty.  She was noted to have moderate to  severe aphagia.  FINAL IMPRESSION AND PLAN:  Left parietal cerebrovascular accident,  questionable cardiac source of embolus.  I agree with transesophageal  echocardiogram.  This will be scheduled for the a.m.  She will need to have  her consent obtained from her power of attorney.                                               Meade Maw, M.D.    HP/MEDQ  D:  08/16/2002  T:  08/16/2002  Job:  [id number]

## 2010-08-01 NOTE — Consult Note (Signed)
[name], [name]                            ACCOUNT NO.:  [id number]   MEDICAL RECORD NO.:  [id number]                   PATIENT TYPE:  INP   LOCATION:  1825                                 FACILITY:  [name]   PHYSICIAN:  Deanna Artis. Sharene Skeans, M.D.           DATE OF BIRTH:  03/06/1935   DATE OF CONSULTATION:  DATE OF DISCHARGE:                                   CONSULTATION   CHIEF COMPLAINT:  Confusion.   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   The patient complained of dizziness to her sister yesterday. She required  some assistance when she accompanied her to a medical appointment.  She went  to work last night, and apparently had no difficulty until 8 o'clock this  morning when it was noted, at change in shift, that she was incoherent.  Her  sister was called and she was brought to [name] arriving at  11:16.   The patient initially showed a confusional state with difficulty with  recall, but also some problems with her language.  It was thought that she  was suffering a TIA.  Dr. Kirby Funk was called and evaluated her.  He  asked me to see her because he was not certain as to the etiology for her  confusional state despite the fact that he noted that she had comprehending  things.   PAST MEDICAL HISTORY:  1. The patient had a prior confusional state about a year ago which cleared     quickly.  She was seen by her physician. She was thought to have had a     TIA, but no workout was carried out.  2. Obesity since teen years.  3. Hypertension for several decades.  4. Non-insulin-dependent diabetes mellitus of 8 years duration.  5. Intraductal carcinoma diagnosed in March 2001 treated with lumpectomy and     radiation, in complete remission.  6. Osteoarthritis.  7. Depression.   REVIEW OF SYSTEMS:  The patient  has not had any complaints of weakness,  numbness, change in vision or hearing disorder, loss of consciousness,  seizures, or loss of bowel and bladder control.  In general she has had good  appetite.  She is morbidly obese.  She has not had problems with head and  neck infection, chronic obstructive pulmonary disease, angina, or other  heart disease palpitations, nausea, vomiting, diarrhea, dysuria, or  hematuria.  She has had significant osteoarthritis, but no other  musculoskeletal condition.  She has not had thyroid disease.  No sickle  cell, no anemia or bruisability, lymphadenopathy.  No known environmental  allergies or immune or autoimmune disorders.  No sickle cell disease.  She  has had no known trauma.  The 12 systems of review is otherwise negative.  PAST SURGICAL HISTORY:  1. Bilateral total knee replacement.  2. Left breast biopsy; partial mastectomy in March 2001.   MEDICATIONS:  1. Glucophage 1000 mg twice daily.  2. Prinzide 20/25 1 per day.  3. Glyburide 5 mg twice a day.  4. Celebrex 200 mg twice a day.  5. Calan SR 240 twice a day.   ALLERGIES:  VICODIN and TETANUS.   FAMILY HISTORY:  Mother died in her 31s or 28s of heart disease.  Father  died in his 40s of cerebrovascular accident.  A half-sister died of  cerebrovascular disease.  One of her sisters has heart disease, another  sister has breast cancer.  Two sisters have diabetes.  Maternal grandmother  may have died of Alzheimer's disease . Maternal grandfather of heart  disease.   SOCIAL HISTORY:  No tobacco use.  Occasional alcohol use.  She is a high  school grad.  She is widowed.   PHYSICAL EXAMINATION:  VITAL SIGNS: On examination today, vital signs BP  156/76, resting pulse 88 and regular, respirations 22, pulse oximetry 98%.  EARS, NOSE, AND THROAT:  No bruits, no infections.  LUNGS:  Clear.  HEART:  No murmurs.  Pulses normal.  ABDOMEN:  Obese, protuberant.  Bowel sounds normal.  No  hepatosplenomegaly.  Nontender abdomen.  EXTREMITIES:  No edema, cyanosis.  SKIN:  No lesions.  NEUROLOGIC:  Mental status awake alert.  She speaks fluently, sometimes with  non sequiturs.  She has dysnomia.  She follows many 1-step commands, she can  repeat some phrases with paraphasias.  Cranial nerves round reactive pupils  2.5 mm to 2 mm.  Fundi normal.  Visual fields full to counting fingers.  Symmetrical facial strength, midline tongue and uvula, air conduction  greater than bone conduction.  Motor examination no drift.  Fine motor  movements normal.  Normal strength.  Sensation intact to primary modalities,  positive straight agnosia or ball which is the only object that she can  name.  Cerebellar examination no tremor, dystaxia, dysmetria.  Gait was not  tested.  Deep tendon reflexes are diminished to absent.  The patient had  bilateral flexor plantar responses.   IMPRESSION:  Ischemic left brain infarction probably in the posterotemporal  parietal region.  The mechanism is as yet unknown although with large vessel  problem it is likely that she either has intracranial or extracranial  disease with an artery-artery embolism.  She has normal sinus rhythm making  heart an unlikely source.   PLAN:  MRI brain, MRA intra-and-extracranial, 2-D echocardiogram, fasting  lipid and homocystine, and hemoglobin A1c.  Treat with aspirin and Plavix.  Treatment for blood pressure.  The patient will need a speech therapist.  She will receive heparin as she progresses.                                               Deanna Artis. Sharene Skeans, M.D.    [name]  D:  08/12/2002  T:  08/12/2002  Job:  [id number]   cc:   [name], M.D.  [address]  [place]  [place] [address]  Fax: [phone]

## 2010-08-01 NOTE — Consult Note (Signed)
[name], [name]                 ACCOUNT NO.:  [id number]   MEDICAL RECORD NO.:  [id number]          PATIENT TYPE:  INP   LOCATION:  6707                         FACILITY:  [name]   PHYSICIAN:  Pramod P. Pearlean Brownie, MD    DATE OF BIRTH:  Dec 21, 1934   DATE OF CONSULTATION:  07/06/2006  DATE OF DISCHARGE:                                 CONSULTATION   REFERRING PHYSICIAN:  Candyce Churn, M.D.   REASON FOR REFERRAL:  Stroke.   HISTORY OF PRESENT ILLNESS:  [name] is a 76 year old African American  lady who has had a previous left brain stroke in 2004 with residual mild  dementia.  She was admitted 5 days ago with sudden onset of expressive  language difficulties and right-sided weakness, which have persisted.  The patient lives at home and lives fairly independent, but does need  some help.  She has noticed increased right-sided weakness and  expressive language difficulties for the last 4 to 5 days.  She had an  MRI scan done, which showed a small area of infarction in the left  coronary artery as well as left occipital lobe.  Actually, she also had  interval silent infarct in the right frontal region, which was not  documented in the MI from 2004.  Stroke workup at that time had included  MRI angiogram of the brain which was unremarkable.  Transesophageal  echocardiogram  had not revealed any cardiac embolism.  She has multiple  vascular risk factors in the form of obesity, diabetes, hypertension,  hyperlipidemia.   PAST SURGICAL HISTORY:  1. Lumpectomy for breast cancer.  2. Uterine prolapse surgery.  3. Bilateral knee replacement.   HOME MEDICATIONS:  1. Aspirin.  2. Plavix.  3. Crestor.  4. Metformin.  5. Hydrochlorothiazide.  6. Lisinopril.  7. Catapres patch.  8. Advair.  9. Fluoxetine.  10.Verapamil.  11.Singulair.  12.Hydrocodone.  13.Aricept.  14.Detrol LA.  15.Zocor SSI.  16.Lovenox.   SOCIAL HISTORY:  She lives at home with family.  She drinks  alcohol  occasionally.  Does not smoke.   FAMILY HISTORY:  Noncontributory.   REVIEW OF SYSTEMS:  Not significant for any chest pain, fever, cough,  shortness of breath, diarrhea.   PHYSICAL EXAMINATION:  GENERAL:  Reveals an obese, elderly, African  American lady who is not in distress.  VITAL SIGNS:  Temperature 98.7, pulse 92 regular sinus, respiratory rate  20, blood pressure 148/89, sats 95% on room air.  HEENT:  Head is nontraumatic.  NECK:  Supple without bruits.  CARDIAC EXAM:  Regular heart sounds.  LUNGS:  Clear to auscultation.  NEUROLOGICAL EXAM:  The patient is awake, alert, and oriented to place  and person, but not to time.  She does follow one and occasional two-  step commands.  She has mild expressive aphagia with some word finding  difficulties and nonfluent speech.  Her comprehension is much better.  She is able to repeat quite well.  She has some difficulty with naming.  Her EOMs are full range without nystagmus.  She blinks to touch  bilaterally.  She is not cooperative for detailed visual field testing.  Has no facial weakness.  Tongue is midline.  MOTOR SYSTEMS EXAM:  Reveals mild right upper extremity drift.  She has  a grade 2-3/5 strength proximally in the shoulder.  She does have better  strength distally in the hand, although fine finger movements are  diminished on the right.  The right grip is weak.  She orbits the left  over right upper extremity.  She has very minimal weakness in the right  hip flexors and ankle dorsal flexors.  Deep tendon reflexes are 1+.  Ankle jerks are absent.  Left plantar is upgoing, right is downgoing.  She has no object or sensory loss, though her sensory system exam is not  reliable.  Her gait was not tested.   DATA REVIEWED:  An MRI scan of the brain shows a small area of punctate  infarct in the left coronary artery as well as left medial occipital  lobe, which is new.  There is an old infarct in the right frontal   periventricular white matter as well as large old left posterior  temporal infarct.  There is good flow voids of both middle cerebral  arteries.   Carotid ultrasound was a technically difficult study, but no  hemodynamically significant stenosis.  A 2D echo shows good left  ventricular systolic function of ejection fraction 60%.  There is mild  dilatation on left atrium.  Transesophageal echocardiogram was done in  2004 and did not reveal any cardiac embolism.  EKG shows variable heart  blocks, first degree, as well as 1 EKG shows AVD association.  The  patient was on Cardizem, which has since been discontinued and she is  now in sinus rhythm.   LABORATORY DATA:  Also reveal evidence of mild urinary tract infection  as well as positive RPR __________  troponin __________ vision test,  which is also reactive.  ESR is 77 mm.  Hemoglobin A1c is normal at 5.9,  B12 is normal at 446, TSH is normal at 0.84.   IMPRESSION:  A 76 year old lady with recent small left hemispheric  subcortical as well as left occipital infarct as well as previous right  frontal and left temporal infarcts likely of cardioembolic etiology  given the fact that she has had strokes of different ages and different  vascular distributions.  She does have underlying mild dementia.  Etiology could be vascular dementia, though the positive RPR could also  endure syphilis.   PLAN:  I would recommend obtaining continuing telemetry monitoring as  well as after the patient is discharged, obtaining CardioNet monitors to  further document either transient AFib or intermittent complete heart  block.  If this is found, she may need anticoagulation or pacemaker.  Also check EEG for seizures.  Discontinue the Plavix and change to  Aggrenox 1 capsule daily for 2 weeks and increase if tolerated to twice  a day for second stroke prevention.  Check fasting lipid profile, homocysteine, transcranial Doppler studies.  The patient has not  been  adequately treated for syphilis in the past.  Consider doing a spinal  tap and if evidence of increase cell count is found or CSF VDRL is  positive she may need 4-week treatment with penicillin for  neurosyphilis.   Thank you for the referral.  I had a long discussion with the patient's  sister regarding her condition.  I discussed plan, evaluation,  treatment, and answered questions.  ______________________________  Sunny Schlein. Pearlean Brownie, MD     PPS/MEDQ  D:  07/06/2006  T:  07/07/2006  Job:  [id number]

## 2010-08-01 NOTE — Op Note (Signed)
. [facility]  Patient:    [name], [name] [facility]                         MRN: [id number] Proc. Date: 06/13/99 Adm. Date:  [id number] Attending:  Bonnetta Barry CC:         Pearla Dubonnet, M.D.                           Operative Report  PREOPERATIVE DIAGNOSIS:  Right breast carcinoma.  POSTOPERATIVE DIAGNOSIS:  Right breast carcinoma.  PROCEDURE:  Right partial mastectomy with right axillary sentinel lymph node biopsy, blue dye injection with blue dye lymphatic mapping, and nuclear medicine lymphatic mapping.  SURGEON:  Velora Heckler, M.D.  FIRST ASSISTANT:  Angelia Mould. Derrell Lolling, M.D.  ANESTHESIA:  General.  ANESTHESIOLOGIST:  Maren Beach, M.D.  ESTIMATED BLOOD LOSS:  Minimal.  PREPARATION:  Betadine.  COMPLICATIONS:  None.  INDICATIONS:  The patient is a 76 year old, [name] who presents with newly diagnosed right breast carcinoma.  The patient had annual mammogram on May 21, 1999 at [facility].  This showed a nodular density in the right breast.  Stereotactic core needle biopsy was performed by Dr. ______ , and it demonstrated invasive ductal carcinoma measuring 7 mm at greatest dimension. The patient now comes to surgery for partial mastectomy with needle localization, followed by sentinel lymph node biopsy.  BODY OF REPORT:  The procedure was done in OR #15 at the [facility].  The patient was brought to the operating room and placed in the supine position on the operating room table.  Following administration of general anesthesia, the patient was prepped and draped in the usual strict aseptic fashion. Lymphazurin blue dye was injected at the site of guide wire placement, both intradermally and into the surrounding breast parenchyma.  This was massaged for five minutes.  Next, the Neo probe was used to map the axilla.  An area of increased radioactivity was identified.  This was marked  with a marking pen. An incision was made in the low axilla with a #10 blade, and dissection was carried down through the subcutaneous tissues with the electrocautery used for hemostasis. The left nodes were identified by following blue lymphatic vessels.  Two lymph nodes, which were both blue and radioactive were identified and excised.  These  were submitted labeled sentinel lymph node #1 and sentinel lymph node #2.  On further scanning of the axilla, further radioactive lymph nodes were identified, and a third and fourth lymph node, which were radioactive but not stained blue ith the lymphazurin blue dye, were excised.  These were also submitted to pathology for permanent sectioning only.  A small remnant of axillary content, which had been  dissected out, was also submitted for permanent sectioning.  Hemostasis was obtained throughout the wound with the electrocautery.  On touch prep of the lymph nodes by Dr. Nicky Pugh, the two sentinel lymph nodes were read as negative for  tumor.  Therefore, the wound was irrigated, and hemostasis was obtained with the electrocautery.  The skin was closed with stainless steel staples.  Next, we turned our attention to the breast.  A curvilinear incision was made along the superior edge of the areola.  Dissection was carried down into the subcutaneous tissue surrounding the guide wire.  A core of breast tissue was excised around the guide wire,  again, using the electrocautery for hemostasis.  The specimen was submitted to mammography, and a specimen mammogram was obtained.  This showed that the lesion in question was present within the specimen.  It was approximately 4 mm from the inferior margin.  The deep margin was approximately 1 cm.  This was also reviewed by Dr. Nicky Pugh, who concurred with approximately a 4 mm gross margin, but on  touch prep, sees atypical cells at the inferior margin.  Therefore, an approximately 1-1.5 cm  rim of tissue was excised from the inferior aspect of the cavity of the right breast.  This was labeled inferior margin with a single suture marking the medial aspect and a double suture marking the superficial aspect. Also, approximately a 1 cm rim of tissue was excised from the superior margin of the dissection cavity, and again, it was marked with a single suture medially and a double suture superficially, and submitted to pathology for review.  Hemoglobin was obtained throughout the wound with the electrocautery.  Margins of dissection were marked on the chest wall with Ligaclips.  The wound was copiously irrigated, and hemostasis was obtained with the electrocautery.  The skin was closed with staples. The wounds were washed and dried, and sterile, occlusive dressings were applied. The patient was awakened from anesthesia and brought to the recovery room in stable condition.  The patient tolerated the procedure well. DD:  06/13/99 TD:  06/14/99 Job: [id number]

## 2010-08-01 NOTE — Consult Note (Signed)
[name], [name] NO.:  [id number]   MEDICAL RECORD NO.:  [id number]          PATIENT TYPE:  INP   LOCATION:  6707                         FACILITY:  [name]   PHYSICIAN:  Antonietta Breach, M.D.  DATE OF BIRTH:  17-May-1934   DATE OF CONSULTATION:  07/05/2006  DATE OF DISCHARGE:                                 CONSULTATION   REQUESTING PHYSICIAN:  Candyce Churn, M.D.   REASON FOR CONSULTATION:  Mental status impairment assess capacity.   HISTORY OF PRESENT ILLNESS:  [name] is a 76 year old female  admitted to the [facility] on July 01, 2006, due to confusion  and weakness.  [name] has demonstrated difficulty with confusion, memory  impairment, and orientation.  She also has demonstrated impaired  judgment.  She has been residing at home.  Prior to admission, she was  having difficulty with severe weakness and not being able to stand.  She  also has had difficulty with urinary incontinence.  On the day of  examination, the patient continues with the same mental status  abnormalities.  Admission to the hospital and her general medical  support have not resolved them.   PAST PSYCHIATRIC HISTORY:  1. [name] does have a history of depression which is mentioned at      least as far back as May 2004, in the medical record, however,      there are no psychotropics mentioned in the past medical record or      in the patient's recent history.  2. The patient does have a history of prior episodic confusion      including a period where she could follow simple commands but would      answer questions inappropriately in May 2004.   FAMILY PSYCHIATRIC HISTORY:  None known.   SOCIAL HISTORY:  The patient has been residing at home.  She is widowed.  Her religion is [employer].  She does not use alcohol or illegal  drugs.  She is occupationally retired.   GENERAL MEDICAL PROBLEMS:  1. Diabetes.  2. Cerebrovascular disease with  history of CVA in the past.  3. Asthma.  4. Arthritis.  5. Chronic back pain.  6. Obesity.  7. Hypertension.   MEDICATIONS:  The MAR is reviewed.  The patient has no psychotropics.   SHE HAS NO KNOWN DRUG ALLERGIES.   LABORATORY DATA:  WBC 6.8, hemoglobin 11.1, platelet count 235.  BUN 32,  creatinine 1.07, calcium 8.6.   A head CT without contrast on July 02, 2006, showed remote left parieto-  occipital and right frontal infarcts, no intracranial abnormality  acutely.  An MR of the brain without contrast on July 02, 2006,  involved considerable motion degradation.  The impression included  multifocal areas of acute left hemisphere infarction, remote left  temporoparietal occipital cortical infarct, extensive atrophy and small  vessel disease.  No acute hemorrhage or shift.   REVIEW OF SYSTEMS:  CONSTITUTIONAL:  Afebrile.  HEAD:  No trauma.  EYES:  No visual changes.  EARS:  No hearing impairment.  NOSE:  No rhinorrhea.  MOUTH/THROAT:  No sore throat.  NEUROLOGIC:  As above.  PSYCHIATRIC:  As  above.  CARDIOVASCULAR:  No chest pain.  RESPIRATORY:  No cough.  GASTROINTESTINAL:  No vomiting or diarrhea.  GENITOURINARY:  No dysuria.  SKIN:  Unremarkable.  MUSCULOSKELETAL:  No deformities.  ENDOCRINE/METABOLIC:  As above.  HEMATOLOGY/LYMPHATIC:  Mild anemia.   PHYSICAL EXAMINATION:  VITAL SIGNS:  Temperature 98.6, pulse 70,  respiration 20, blood pressure 125/81, O2 saturation on room air 97%.   MENTAL STATUS EXAM:  [name] is an elderly female who is alert  with good eye contact, appearing her chronologic age, lying in a  partially reclined supine position in her hospital bed.  On orientation  testing she is oriented only to the self.  Thought process involves  confabulation.  Thought content:  No evidence of thoughts of harming  herself or others.  No hallucinations or delusions.  Her fund of  knowledge and intelligence are below that of her estimated premorbid   baseline.  Her concentration is mildly decreased due to impaired memory.  On memory testing, she names three out of three immediate, but zero out  of three on recall.  Her insight is poor.  Her judgment is impaired.  Her affect is mildly anxious.  Her mood is grossly within normal limits.  She is well-groomed.   ASSESSMENT:   AXIS I:  1. 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 Rule out dementia due to cerebrovascular disease.  3. History of depressive disorder not otherwise specified.   AXIS II:  None.   AXIS III:  See general medical problems.   AXIS IV:  General medical primary support group.   AXIS V:  Thirty.   [name] demonstrates severe impairment in judgment and memory.  She  does not have the capacity for informed consent.   RECOMMENDATIONS:  Would monitor the patient's mood for a possible re-  emergence of depression in the post-stroke setting and treat  appropriately.      Antonietta Breach, M.D.  Electronically Signed    JW/MEDQ  D:  07/10/2006  T:  07/10/2006  Job:  [id number]

## 2010-08-01 NOTE — H&P (Signed)
[name], [name] [facility]                            ACCOUNT NO.:  [id number]   MEDICAL RECORD NO.:  [id number]                   PATIENT TYPE:  INP   LOCATION:  1825                                 FACILITY:  [name]   PHYSICIAN:  [name], M.D.               DATE OF BIRTH:  January 04, 1935   DATE OF ADMISSION:  08/12/2002  DATE OF DISCHARGE:                                HISTORY & PHYSICAL   CHIEF COMPLAINT:  Confusion.   HISTORY OF PRESENT ILLNESS:  This is a 76 year old [name] female with a  history of hypertension, diabetes, breast cancer, and TIAs who presents  with confusion.  She went to work last p.m. feeling okay.  Her sister was  called at 8 p.m. and was told that the patient was incoherent.  The patient  was unable to remember her own name, her sister's name, or where she was.  She can follow simple commands but answers questions inappropriately.  For  example, if asked what her name was she responds with I take glyburide for  diabetes.  She has had similar episodes in the past but not in the last 2-3  years.  Those episodes were not as severe or long lasting.  She was told she  had TIAs and was supposed to be taking aspirin and Plavix, but her sister is  not sure whether she is.  She denies any alcohol or drug use.   PAST MEDICAL HISTORY:  1. Hypertension.  2. Diabetes mellitus type 2.  3. Depression.  4. Breast cancer, March 2001, T1, N0, M0 ductal/DCIS right breast.  Status     post right partial lumpectomy with negative sentinel lymph node biopsy     and then XRT.   PAST SURGICAL HISTORY:  1. Right partial mastectomy in March 2001, Dr. Lovell Sheehan.  2. Right bilateral total knee replacement.   ALLERGIES:  1. VICODIN.  2. TETANUS.   CURRENT MEDICATIONS:  1. Glucophage 1000 mg b.i.d.  2. Prinzide 25 mg q.i.d.  3. Glyburide 5 mg b.i.d.  4. Celebrex 200 mg b.i.d.  5. Calan 240 mg b.i.d.  6. Plavix 75 mg p.o. daily prescribed, unsure if taking.  7. Aspirin once  daily prescribed, unsure if taking.   FAMILY HISTORY:  Remarkable for breast cancer in three women and diabetes.   SOCIAL HISTORY:  Husband deceased.  No children.  She lives alone.  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  She does not smoke or drink alcohol.  She denies drug use.   REVIEW OF SYSTEMS:  Otherwise, negative.   PHYSICAL EXAMINATION:  GENERAL:  This is an obese Ackert female.  She is  alert but oriented x0.  She is able to follow simple commands.  VITAL SIGNS:  Blood pressure 156/76, heart rate 88, respirations 22,  temperature 99.1, oxygen saturation 98% on room air.  HEENT:  Pupils equal,  round, and respond to light.  Extraocular movements  are normal.  Ears and tympanic membranes are clear.  Oropharynx is clear.  NECK:  Supple.  No carotid bruits.  LUNGS:  Clear.  HEART:  Regular rate and rhythm without murmurs, rubs, or gallops.  ABDOMEN:  Morbidly obese.  Soft, nontender, and no obvious mass.  RECTAL:  Deferred.  EXTREMITIES:  Showed no edema.  Normal pedal pulses.  There are no foot  ulcers.  NEUROLOGICAL:  She is alert and oriented x0.  She answers questions  inappropriately, when asked what her name was she responded that she took  glyburide for diabetes.  Cranial nerves II-XII are intact.  Strength is  5/5  in all extremities.  Reflexes are 2/4 throughout.  Gait is not tested.   LABORATORY DATA:  Sodium 138, potassium 4.8, chloride 105, bicarbonate 25,  BUN 20, creatinine 0.8, glucose 150.  CBC: hemoglobin 13.3, WBC 5.2,  platelet count 248.  Calcium 5.3.  Alcohol less than 5.  An urine drug  screen and urinalysis are pending.   A chest x-ray is pending.  A CT scan shows a question of subtle early  ischemia in the right parietal occipital cortex with chronic ischemic  changes and no hemorrhage.   EKG normal sinus rhythm with a first degree AV block.   ASSESSMENT:  1. Acute confusion, she is not delirious.  The main differential is early     acute  cerebrovascular accident versus transient global amnesia.  2. Diabetes mellitus type 2.  3. Hypertension.  4. History of breast cancer early stage.   PLAN:  Admit.  Restart aspirin and Plavix.  Neurology consultation.  MRI and  MRA of the brain.  Outpatient medications, hold glyburide and Celebrex.  Hemoglobin A1c.                                               [name], M.D.    Delorse Limber  D:  08/12/2002  T:  08/12/2002  Job:  [id number]   cc:   Candyce Churn, M.D.  [address] [place]  [place] [address]  Fax: [phone]

## 2010-08-01 NOTE — Op Note (Signed)
[name], [name] [facility]                            ACCOUNT NO.:  [id number]   MEDICAL RECORD NO.:  [id number]                   PATIENT TYPE:  OIB   LOCATION:  2899                                 FACILITY:  [name]   PHYSICIAN:  Velora Heckler, M.D.                DATE OF BIRTH:  Jul 30, 1934   DATE OF PROCEDURE:  10/04/2003  DATE OF DISCHARGE:                                 OPERATIVE REPORT   PREOPERATIVE DIAGNOSIS:  Left breast cancer.   POSTOPERATIVE DIAGNOSIS:  Left breast cancer.   PROCEDURES:  1. Left breast partial mastectomy with needle localization.  2. Left axillary sentinel lymph node biopsy.  3. Blue dye injection.   SURGEON:  Velora Heckler, M.D.   ASSISTANT:  Ollen Gross. Carolynne Edouard, M.D.   ANESTHESIA:  General anesthesia per Bedelia Person, M.D.   ESTIMATED BLOOD LOSS:  Minimal.   PREPARATION:  Betadine.   COMPLICATIONS:  None.   INDICATIONS FOR PROCEDURE:  The patient is a 76 year old Vessels female who  presents to my practice after abnormal mammogram and core needle biopsy  demonstrating mammary carcinoma in the left breast.  The patient had a  previous history of right breast carcinoma treated in March of 2001.  The  patient now comes to surgery for definitive excision and sentinel lymph node  biopsy.   DESCRIPTION OF PROCEDURE:  Procedure is done in OR #16 at the [facility].  The patient is brought to the operating room and placed  in supine position on the operating room table.  Following administration of  general anesthesia, the patient is prepped and draped in the usual strict  aseptic fashion.  Lymphazurin blue dye is injected in a retroareolar and  subdermal fashion.  This was massaged for five minutes.  Breast is scanned  with the Neoprobe.  An area of increased radioactivity is noted in the low  left axilla.  This is marked accordingly on the skin.   After ascertaining that an adequate level of anesthesia had been maintained  and the skin was  prepped and draped in the usual strict aseptic fashion, an  incision was made in the left axilla.  Dissection is carried down through  subcutaneous tissues.  Using the Neoprobe for guidance, three separate lymph  nodes containing increased radioactivity levels were excised using the  electrocautery for hemostasis.  These were submitted as sentinel lymph nodes  #1, #2 and #3.  All three were reviewed by Dr. Berneta Levins who sees no sign  of metastatic carcinoma.  Additional axillary content was also submitted to  pathology for review.  Good hemostasis was obtained with the electrocautery.  A dry pack was placed in the axillary wound.   Next, we turned our attention to the left breast.  Guidewire has previously  been placed in the left upper breast.  An incision is made with a #  10 blade  along beside the guidewire. Dissection is carried through skin and  subcutaneous tissues with the electrocautery and hemostasis obtained.  A  core of breast tissue around the guidewire is excised using the  electrocautery.  Dissection is carried around the guidewire widely down to  the chest wall.  The lesion is palpable in the breast.  A relatively wide  margin of tissue is taken with the electrocautery and good hemostasis is  achieved.  Entire core is excised with the guidewire in position.  This is  marked with margin map labels.  It is then submitted to radiology for  specimen mammogram.  Radiologist confirms that the lesion in question is  present within the specimen.  It is then sent for touch preps for margins by  Dr. Berneta Levins, M.D., and pathology.  Margins of dissection in the breast  cavity are marked with medium Ligaclips.  Good hemostasis is obtained with  the electrocautery.  Subcutaneous tissues are closed with interrupted 3-0  Vicryl sutures.  Skin is closed with running 4-0 Vicryl subcuticular suture.  Skin is anesthetized with local anesthetic.  Axillary packs are removed and  the  subcutaneous tissues closed with interrupted 3-0 Vicryl sutures.  Skin  is closed with a running 4-0 Vicryl subcuticular suture.  Both wounds are  washed and dried and Steri-Strips are applied.  Sterile dressings are  applied.  The patient is awakened from anesthesia and taken to the recovery  room in stable condition.  The patient tolerated the procedure well.                                               Velora Heckler, M.D.    TMG/MEDQ  D:  10/04/2003  T:  10/04/2003  Job:  [id number]   cc:   Candyce Churn, M.D.  [address] [place]  [place] [address]  Fax: [phone]   [facility], M.D.

## 2010-08-01 NOTE — Consult Note (Signed)
NAMEAIDAN, CALOCA NO.:  [id number]   MEDICAL RECORD NO.:  [id number]          PATIENT TYPE:  INP   LOCATION:  6707                         FACILITY:  [name]   PHYSICIAN:  Armanda Magic, M.D.     DATE OF BIRTH:  1934-04-25   DATE OF CONSULTATION:  07/02/2006  DATE OF DISCHARGE:                                 CONSULTATION   REFERRING PHYSICIAN:  Dr. Johnella Moloney.   CHIEF COMPLAINT:  Confusion and weakness.   HISTORY OF PRESENT ILLNESS:  This 76 year old, African American, obese  female with a history of CVA, diabetes mellitus - type 2, and  hypertension who was admitted with acute confusion and weakness.  She  was in a senior citizens class and developed weakness and could not  stand.  She was confused and had urinary incontinence.  She was noted to  have sinus bradycardia with heart rates in the 50s with first-degree AV  block in the ER.  She then was noted to have a second degree AV block on  telemetry and we were asked to consult.   PAST MEDICAL HISTORY:  1. Hypertension.  2. Type 2 diabetes mellitus.  3. Obesity.  4. Asthma.  5. Arthritis.  6. Remote CVA.  7. Breast CA.   PAST SURGICAL HISTORY:  1. Status post bilateral total knee replacements.  2. Status post breast lumpectomy.   ALLERGIES:  QUESTION OF:  1. VICODIN.  2. TETANUS.   SOCIAL HISTORY:  She resides at home.  She denies any tobacco use.  Occasionally drinks alcohol.   MEDICATIONS ON ADMISSION:  Include hydrocodone, NSAIDs, metformin,  Advair, Celebrex, Plavix, lisinopril, HCTZ, and questionably Catapres.   PHYSICAL EXAMINATION:  VITAL SIGNS:  Blood pressure is 116/74, heart  rate 56.  GENERAL:  This a well-developed, obese, Hirschhorn female in no acute  distress.  HEENT:  Benign.  NECK:  Supple without lymphadenopathy.  LUNGS:  Clear to auscultation throughout.  HEART:  Regular rate and rhythm but slow with a 2/6 systolic murmur at  the right upper sternal border radiating to  the left lower sternal  border.  ABDOMEN:  Obese, nontender, nondistended with active bowel sounds.  EXTREMITIES:  No cyanosis, erythema, or edema.   LABORATORY:  Sodium 136, potassium 4.5, chloride 105, bicarb 22, BUN 30,  creatinine 1.3, glucose I do not have.  Platelet count 217, white cell  count 7, hematocrit 36.1, hemoglobin 11.8.  CPK 194, MB 4, troponin  0.14.  Head CT showed an old left parieto-occipital and right frontal  infarct.  EKG on admission showed sinus bradycardia versus normal sinus  rhythm with a second degree block.  Telemetry shows sinus rhythm with  long first-degree block with intermittent second-degree AV block.   ASSESSMENT:  1. Acute confusional state of questionable etiology.  2. Questionable intermittent second-degree AV block.  I am not sure      that this is the cause of the patient's confusion since her heart      rates only drop in the low 50s with AV block.  3. Increased troponin with normal  CPK-MB of questionable etiology.  4. History of cerebrovascular accident, remotely.  5. Hypertension.  6. Diabetes mellitus.  7. Systolic heart murmur.   PLAN:  1. Check cardiac enzymes q.8 h x3.  2. Check a 2-D echocardiogram to evaluate left ventricular function      and heart murmur.  3. Will follow with you.      Armanda Magic, M.D.  Electronically Signed     TT/MEDQ  D:  07/02/2006  T:  07/02/2006  Job:  [id number]   cc:   Candyce Churn, M.D.

## 2010-08-01 NOTE — Op Note (Signed)
. [facility]  Patient:    [name], [name] [facility]                         MRN: [id number] Proc. Date: 02/19/00 Adm. Date:  [id number] Attending:  Chapman Moss                           Operative Report  PREOPERATIVE DIAGNOSES: 1. A very large retained seroma in the right breast with a seroma capsule. 2. Previously-treated breast cancer.  POSTOPERATIVE DIAGNOSES: 1. A very large retained seroma in the right breast with a seroma capsule. 2. Previously-treated breast cancer.  PROCEDURE: 1. Drainage of a very large, complicated seroma, right breast. 2. Excision of an extensive seroma capsule.  Total area treated approximately    400 sq cm in area for seroma excision.  SURGEON:  Teena Irani. Odis Luster, M.D.  ANESTHESIA:  General.  ESTIMATED BLOOD LOSS:  Minimal.  DRAINS:  One Blake drain left.  CLINICAL NOTE:  This 76 year old woman has had a right breast cancer and had been treated by a general surgeon with lumpectomy, sentinel node, and had also had radiation.  She developed a very large seroma in the right breast.  This has been aspirated and treated multiple times by her general surgeon, to no avail.  An attempt was made to try to treat this through the radiology services with a pigtail catheter, but this was declined by the radiology services due to the extreme difficulty of the case.  She was then referred to plastic surgery for final definitive management.  The nature of the procedure and the risks were well-understood by her, including the possibility that the seroma would recur.  She understood that there would be certainly some change in the breast from the drainage of this very large seroma cavity, and she wished to proceed.  DESCRIPTION OF PROCEDURE:  The patient was taken to the operating room and placed supine.  After successful induction of general anesthesia, she was prepped with Betadine and draped with sterile drapes.  The  circumareolar incision along the superior border of the areola was utilized, which was the same approach as the general surgeon.  Dissection was carried down through the subcutaneous tissue.  The underlying seroma cavity was identified, entered, and the fluid cultured.  The fluid was drained out.  It was then that an extensive thick-walled capsule was identified, and this was carefully excised in its entirety, using the electrocautery for hemostasis and bleeding control. This had been excised.  It was removed as a specimen.  There were no areas suspicious for carcinoma, but nevertheless it was felt that this should be certainly examined by the pathologist.  Thorough irrigation with saline, and again meticulous hemostasis with electrocautery.  A Blake drain was positioned, brought out through a separate stab wound laterally, and secured with a 3-0 Prolene suture.  The closure of 2-0 Vicryl interrupted inverted deep sutures and 4-0 Monocryl running subcuticular sutures.  Steri-Strips, dry sterile dressing, superficial Ace wrap were applied, and she tolerated the procedure well.  DISPOSITION:  She will be on Keflex 500 mg p.o. q.i.d., Percocet one to two p.o. q.4-6h. for pain, and other medicines as before.  No exercising.  No lifting.  No shower.  Keep the dressing dry and in place.  Empty the drain three times a day and record the amount.  We will see her back early next  week for recheck. DD:  02/19/00 TD:  02/19/00 Job: [id number]

## 2010-08-01 NOTE — Procedures (Signed)
EEG NUMBER:  10/[id number].   HISTORY:  This is a 76 year old with a history of severe weakness and  confusion, is evaluated for possible stroke.  This is, again, a routine  EEG.  Medications include Lovenox, insulin, Advair, Zocor, Detrol,  Zestril, Singulair, Catapres, Aricept, Prozac, Lantus insulin, aspirin,  Tylenol, and Aggrenox.   EEG CLASSIFICATION:  Dysrhythmia grade 2, left temporal.   DESCRIPTION OF RECORDING:  The background rhythm of this recording  consists of a moderately well-modulated medium-amplitude background  activity of 6-7 Hz that is minimally reactive.  As the record  progresses, the patient at times appears to be in the drowsy state with  more prominent generalized slowing seen.  No evidence of stage II sleep  is seen.  Photic stimulation is performed resulting in a minimal photic  driving response.  At times during the recording, there is mild  asymmetric slowing involving the left temporal area.  At no time does  there appear to be evidence of spike or spike wave discharges.  EKG  monitor is of poor quality on the study, appears to show a heart rate of  102.   IMPRESSION:  This is an abnormal EEG recording due to generalized  background slowing.  This is consistent with a toxic metabolic  encephalopathy or any process results in underlying dementia.  There is  at times some mild slowing emanating from the left hemisphere that could  be secondary to focal brain abnormality in this area such as may be seen  with a stroke event.  Clinical correlation is required.  No epileptiform  discharges were seen.      Marlan Palau, M.D.  Electronically Signed     BJY:NWGN  D:  07/07/2006 18:37:18  T:  07/08/2006 06:18:37  Job #:  [id number]

## 2010-08-01 NOTE — Discharge Summary (Signed)
NAMEGLENA, [name] [facility]                            ACCOUNT NO.:  [id number]   MEDICAL RECORD NO.:  [id number]                   PATIENT TYPE:  INP   LOCATION:  3008                                 FACILITY:  [name]   PHYSICIAN:  Georgann Housekeeper, M.D.                 DATE OF BIRTH:  [birth year]   DATE OF ADMISSION:  08/12/2002  DATE OF DISCHARGE:  08/21/2002                                 DISCHARGE SUMMARY   DISPOSITION:  The patient will be going to [facility] for  rehabilitation.   DISCHARGE MEDICATIONS:  1. Plavix 75 mg q.d.  2. Aspirin 325 mg q.d.  3. Verapamil 240 mg b.i.d.  4. Lisinopril 20 mg q.d.  5. Hydrochlorothiazide 25 mg q.d.  6. Zocor 20 mg q.d.  7. Glucophage 1000 mg b.i.d.  8. Senokot one at night.  9. Glyburide 2.5 mg q.d.  10.      Tylenol p.r.n.   CONDITION ON DISCHARGE:  Stable.   DISCHARGE DIAGNOSES:  1. Large, left posterior temporoparietal stroke with expressive aphasia and     some weakness in the right extremities.  2. Hypertension.  3. Type 2 diabetes.   CONSULTATIONS:  1. Neurology, Dr. Sharene Skeans.  2. Cardiology for TEE, Dr. Fraser Din.  3. Physical therapy.  4. Speech therapy.   PROCEDURES:  1. Magnetic resonance angiogram.  2. Two-dimensional echocardiogram.  3. Transesophageal echocardiogram.  This was negative for an embolic source.  4. Magnetic resonance imaging was negative for any occlusion intracranial.     This showed an acute left posterior temporoparietal lobe stroke, no     evidence of any bleed.   LABORATORY DATA AND X-RAY FINDINGS:  Significant finding for homocystine  level of 11 which was within normal limits.  A1C was 9.0.  Lipid profile  with cholesterol 197, LDL 122, triglycerides 167.  Other lab work remained  normal including blood chemistries, blood counts and urine.   HOSPITAL COURSE:  The patient is a 76 year old, African-American female with  history of type 2 diabetes, hypertension, obesity, TIA,  arthritis,  intraductal carcinoma of the breast diagnosed in 2001, with lumpectomy and  radiation and depression who was admitted with confusion and weakness.   Problem 1.  NEUROLOGY:  The patient had CT scan initially and subsequently  showed questionable ischemic infarct in the posterior parietal lobe.  Admitted with blood thinners and started Lovenox and neurology was  consulted.  The patient had an MRI/MRA.  She also had a TEE to get the  source of embolic event.  The TEE was negative.  The patient was continued  on blood thinners and then was started on Plavix and aspirin.  She was  continued with rehabilitation.  She had expressive aphasia.  Swallowing  study was done which was okay.  The patient continues to do well with  eating.  The patient had no arrhythmias on telemetry.  Speech, physical and  occupational therapy were consulted.  The carotid ultrasound was done which  showed no evidence of any stenosis.  The patient continues to improve with  the therapy.   Problem 2.  HYPERTENSION:  This remained stable during the hospital course.   Problem 3.  DIABETES:  She had an episode of low sugar in the 60s.  Her  Glyburide was decreased to 2.5 mg from 5 mg and she did well.  Her ANC was  9.0 that reflects she may have been in poor control prior.   FOLLOW UP:  Follow up with Dr. Johnella Moloney upon release from  rehabilitation.                                               Georgann Housekeeper, M.D.    Arliss Journey  D:  08/21/2002  T:  08/21/2002  Job:  [id number]   cc:   Deanna Artis. Sharene Skeans, M.D.  [address]  [address]  [place]  [place] [address]  Fax: [phone]

## 2010-09-08 ENCOUNTER — Other Ambulatory Visit: Payer: Self-pay | Admitting: Internal Medicine

## 2010-09-08 DIAGNOSIS — N63 Unspecified lump in unspecified breast: Secondary | ICD-10-CM

## 2010-09-16 ENCOUNTER — Other Ambulatory Visit: Payer: Self-pay

## 2010-12-09 ENCOUNTER — Other Ambulatory Visit: Payer: Self-pay | Admitting: Internal Medicine

## 2010-12-09 DIAGNOSIS — Z853 Personal history of malignant neoplasm of breast: Secondary | ICD-10-CM

## 2010-12-17 ENCOUNTER — Ambulatory Visit
Admission: RE | Admit: 2010-12-17 | Discharge: 2010-12-17 | Disposition: A | Discharge status: Home / Self Care | Payer: Medicare Other | Source: Ambulatory Visit | Attending: Internal Medicine | Admitting: Internal Medicine

## 2010-12-17 DIAGNOSIS — Z853 Personal history of malignant neoplasm of breast: Secondary | ICD-10-CM

## 2011-12-05 ENCOUNTER — Inpatient Hospital Stay (HOSPITAL_COMMUNITY)
Admission: EM | Admit: 2011-12-05 | Discharge: 2011-12-08 | DRG: 689 | Disposition: A | Discharge status: Skilled Nursing Facility | Payer: Medicare Other | Attending: Internal Medicine | Admitting: Internal Medicine

## 2011-12-05 ENCOUNTER — Emergency Department (HOSPITAL_COMMUNITY): Payer: Medicare Other

## 2011-12-05 ENCOUNTER — Encounter (HOSPITAL_COMMUNITY): Payer: Self-pay | Admitting: Emergency Medicine

## 2011-12-05 DIAGNOSIS — F329 Major depressive disorder, single episode, unspecified: Secondary | ICD-10-CM

## 2011-12-05 DIAGNOSIS — R4701 Aphasia: Secondary | ICD-10-CM | POA: Diagnosis present

## 2011-12-05 DIAGNOSIS — Z7902 Long term (current) use of antithrombotics/antiplatelets: Secondary | ICD-10-CM

## 2011-12-05 DIAGNOSIS — J45909 Unspecified asthma, uncomplicated: Secondary | ICD-10-CM | POA: Diagnosis present

## 2011-12-05 DIAGNOSIS — E669 Obesity, unspecified: Secondary | ICD-10-CM | POA: Diagnosis present

## 2011-12-05 DIAGNOSIS — I6930 Unspecified sequelae of cerebral infarction: Secondary | ICD-10-CM | POA: Clinically undetermined

## 2011-12-05 DIAGNOSIS — F0150 Vascular dementia without behavioral disturbance: Secondary | ICD-10-CM | POA: Insufficient documentation

## 2011-12-05 DIAGNOSIS — I6990 Unspecified sequelae of unspecified cerebrovascular disease: Secondary | ICD-10-CM | POA: Clinically undetermined

## 2011-12-05 DIAGNOSIS — F0390 Unspecified dementia without behavioral disturbance: Secondary | ICD-10-CM | POA: Insufficient documentation

## 2011-12-05 DIAGNOSIS — N19 Unspecified kidney failure: Secondary | ICD-10-CM

## 2011-12-05 DIAGNOSIS — I10 Essential (primary) hypertension: Secondary | ICD-10-CM

## 2011-12-05 DIAGNOSIS — K449 Diaphragmatic hernia without obstruction or gangrene: Secondary | ICD-10-CM | POA: Diagnosis present

## 2011-12-05 DIAGNOSIS — A498 Other bacterial infections of unspecified site: Secondary | ICD-10-CM | POA: Diagnosis present

## 2011-12-05 DIAGNOSIS — G8194 Hemiplegia, unspecified affecting left nondominant side: Secondary | ICD-10-CM | POA: Diagnosis present

## 2011-12-05 DIAGNOSIS — G309 Alzheimer's disease, unspecified: Secondary | ICD-10-CM | POA: Diagnosis present

## 2011-12-05 DIAGNOSIS — J309 Allergic rhinitis, unspecified: Secondary | ICD-10-CM | POA: Insufficient documentation

## 2011-12-05 DIAGNOSIS — G8190 Hemiplegia, unspecified affecting unspecified side: Secondary | ICD-10-CM | POA: Diagnosis present

## 2011-12-05 DIAGNOSIS — E559 Vitamin D deficiency, unspecified: Secondary | ICD-10-CM | POA: Insufficient documentation

## 2011-12-05 DIAGNOSIS — E119 Type 2 diabetes mellitus without complications: Secondary | ICD-10-CM | POA: Diagnosis present

## 2011-12-05 DIAGNOSIS — E860 Dehydration: Secondary | ICD-10-CM | POA: Diagnosis present

## 2011-12-05 DIAGNOSIS — H409 Unspecified glaucoma: Secondary | ICD-10-CM | POA: Diagnosis present

## 2011-12-05 DIAGNOSIS — R4182 Altered mental status, unspecified: Secondary | ICD-10-CM

## 2011-12-05 DIAGNOSIS — M6281 Muscle weakness (generalized): Secondary | ICD-10-CM | POA: Diagnosis present

## 2011-12-05 DIAGNOSIS — K219 Gastro-esophageal reflux disease without esophagitis: Secondary | ICD-10-CM | POA: Diagnosis present

## 2011-12-05 DIAGNOSIS — E785 Hyperlipidemia, unspecified: Secondary | ICD-10-CM | POA: Diagnosis present

## 2011-12-05 DIAGNOSIS — Z79899 Other long term (current) drug therapy: Secondary | ICD-10-CM

## 2011-12-05 DIAGNOSIS — R5381 Other malaise: Secondary | ICD-10-CM | POA: Insufficient documentation

## 2011-12-05 DIAGNOSIS — I639 Cerebral infarction, unspecified: Secondary | ICD-10-CM

## 2011-12-05 DIAGNOSIS — F32A Depression, unspecified: Secondary | ICD-10-CM | POA: Insufficient documentation

## 2011-12-05 DIAGNOSIS — F0280 Dementia in other diseases classified elsewhere without behavioral disturbance: Secondary | ICD-10-CM | POA: Diagnosis present

## 2011-12-05 DIAGNOSIS — F3289 Other specified depressive episodes: Secondary | ICD-10-CM | POA: Diagnosis present

## 2011-12-05 DIAGNOSIS — N390 Urinary tract infection, site not specified: Principal | ICD-10-CM | POA: Diagnosis present

## 2011-12-05 DIAGNOSIS — N179 Acute kidney failure, unspecified: Secondary | ICD-10-CM | POA: Diagnosis present

## 2011-12-05 DIAGNOSIS — G9341 Metabolic encephalopathy: Secondary | ICD-10-CM | POA: Diagnosis present

## 2011-12-05 DIAGNOSIS — N289 Disorder of kidney and ureter, unspecified: Secondary | ICD-10-CM | POA: Diagnosis present

## 2011-12-05 DIAGNOSIS — K5900 Constipation, unspecified: Secondary | ICD-10-CM | POA: Diagnosis present

## 2011-12-05 DIAGNOSIS — R5383 Other fatigue: Secondary | ICD-10-CM | POA: Insufficient documentation

## 2011-12-05 DIAGNOSIS — R197 Diarrhea, unspecified: Secondary | ICD-10-CM | POA: Insufficient documentation

## 2011-12-05 HISTORY — DX: Neoplasm of unspecified behavior of breast: D49.3

## 2011-12-05 HISTORY — DX: Hyperlipidemia, unspecified: E78.5

## 2011-12-05 HISTORY — DX: Obesity, unspecified: E66.9

## 2011-12-05 HISTORY — DX: Cerebral infarction, unspecified: I63.9

## 2011-12-05 HISTORY — DX: Unspecified asthma, uncomplicated: J45.909

## 2011-12-05 HISTORY — DX: Depression, unspecified: F32.A

## 2011-12-05 HISTORY — DX: Other malaise: R53.81

## 2011-12-05 HISTORY — DX: Unspecified glaucoma: H40.9

## 2011-12-05 HISTORY — DX: Vitamin D deficiency, unspecified: E55.9

## 2011-12-05 HISTORY — DX: Diarrhea, unspecified: R19.7

## 2011-12-05 HISTORY — DX: Other fatigue: R53.83

## 2011-12-05 HISTORY — DX: Allergic rhinitis, unspecified: J30.9

## 2011-12-05 HISTORY — DX: Muscle weakness (generalized): M62.81

## 2011-12-05 HISTORY — DX: Constipation, unspecified: K59.00

## 2011-12-05 HISTORY — DX: Dementia in other diseases classified elsewhere, unspecified severity, without behavioral disturbance, psychotic disturbance, mood disturbance, and anxiety: F02.80

## 2011-12-05 HISTORY — DX: Aphasia: R47.01

## 2011-12-05 HISTORY — DX: Alzheimer's disease, unspecified: G30.9

## 2011-12-05 HISTORY — DX: Essential (primary) hypertension: I10

## 2011-12-05 HISTORY — DX: Unspecified dementia, unspecified severity, without behavioral disturbance, psychotic disturbance, mood disturbance, and anxiety: F03.90

## 2011-12-05 HISTORY — DX: Urinary tract infection, site not specified: N39.0

## 2011-12-05 HISTORY — DX: Diaphragmatic hernia without obstruction or gangrene: K44.9

## 2011-12-05 HISTORY — DX: Major depressive disorder, single episode, unspecified: F32.9

## 2011-12-05 LAB — COMPREHENSIVE METABOLIC PANEL
ALT: 25 U/L (ref 0–35)
AST: 19 U/L (ref 0–37)
Alkaline Phosphatase: 77 U/L (ref 39–117)
CO2: 21 mEq/L (ref 19–32)
Chloride: 108 mEq/L (ref 96–112)
GFR calc Af Amer: 45 mL/min — ABNORMAL LOW (ref >90)
GFR calc non Af Amer: 39 mL/min — ABNORMAL LOW (ref >90)
Glucose, Bld: 225 mg/dL — ABNORMAL HIGH (ref 70–99)
Potassium: 4 mEq/L (ref 3.5–5.1)
Sodium: 144 mEq/L (ref 135–145)

## 2011-12-05 LAB — URINALYSIS, ROUTINE W REFLEX MICROSCOPIC
Bilirubin Urine: NEGATIVE
Hgb urine dipstick: NEGATIVE
Ketones, ur: NEGATIVE mg/dL
Nitrite: NEGATIVE
Urobilinogen, UA: 1 mg/dL (ref 0.0–1.0)

## 2011-12-05 LAB — URINE MICROSCOPIC-ADD ON

## 2011-12-05 LAB — CBC
Hemoglobin: 13.1 g/dL (ref 12.0–15.0)
MCH: 27.9 pg (ref 26.0–34.0)
Platelets: 173 10*3/uL (ref 150–400)
RBC: 4.7 MIL/uL (ref 3.87–5.11)
WBC: 6.8 10*3/uL (ref 4.0–10.5)

## 2011-12-05 NOTE — ED Notes (Signed)
X-ray at bedside to do port - chest x-ray.

## 2011-12-05 NOTE — ED Notes (Signed)
Pt asked multiple questions wouldn't respond. No family at bedside to answer questions. Pt will follow some commands, sometimes shakes head to answer some questions.

## 2011-12-05 NOTE — ED Notes (Signed)
Pt's sister at bedside. Per pt's sister - pt has been acting differently than normal by holding her right arm closer against her body and not talking. Pt will repeat words that her sister asks her to repeat but the pt will not engage in conversation.

## 2011-12-05 NOTE — ED Notes (Signed)
Patient from [facility].  Per EMS, patient was last seen normal yesterday morning by family -- family notified staff.  Per family, patient has "not been acting herself" (altered mental status).  Patient is not speaking; mute.  Family feels that the right side of her face in drawn; not noted by EMS.  Patient will not straighten out either arm; does not follow basic commands.

## 2011-12-05 NOTE — ED Provider Notes (Signed)
History     CSN: [id number]  Arrival date & time 12/05/11  2133   First MD Initiated Contact with Patient 12/05/11 2259      Chief Complaint  Patient presents with  . Altered Mental Status    (Consider location/radiation/quality/duration/timing/severity/associated sxs/prior treatment) Patient is a 77 y.o. female presenting with altered mental status. The history is provided by the nursing home and a relative. The history is limited by the condition of the patient (Altered mental status).  Altered Mental Status  Family states that her she is not her normal self. Since some time last week, she has not been talking the way she normally talks, she is in the right arm the way she normally does, and she is not following commands. This is a fairly dramatic change. They state that symptoms have been well since they changed. She does have a history of prior strokes. They're not aware of any fever or cough or vomiting or diarrhea. They're not aware of any medication changes.  Past Medical History  Diagnosis Date  . Left hemiplegia   . Aphasia   . Muscle weakness   . Diabetes mellitus   . Malaise   . Fatigue   . Breast neoplasm   . Hypertension   . Glaucoma   . UTI (lower urinary tract infection)   . Vitamin d deficiency   . Hiatal hernia   . Constipation   . Diarrhea   . Asthma   . Obesity   . Hyperlipidemia   . Alzheimer's dementia   . Depression   . Allergic rhinitis   . Dementia     History reviewed. No pertinent past surgical history.  History reviewed. No pertinent family history.  History  Substance Use Topics  . Smoking status: Never Smoker   . Smokeless tobacco: Not on file  . Alcohol Use: No    OB History    Grav Para Term Preterm Abortions TAB SAB Ect Mult Living                  Review of Systems  Unable to perform ROS: Mental status change  Psychiatric/Behavioral: Positive for altered mental status.    Allergies  Penicillins; Tetanus toxoids; and  Vicodin  Home Medications   Current Outpatient Rx  Name Route Sig Dispense Refill  . AMLODIPINE BESYLATE 5 MG PO TABS Oral Take 5 mg by mouth daily.    . ATORVASTATIN CALCIUM 40 MG PO TABS Oral Take 40 mg by mouth daily.    Marland Kitchen CLONIDINE HCL 0.1 MG/24HR TD PTWK Transdermal Place 1 patch onto the skin once a week. APPLY NEW PATCH ON SATURDAY    . UTI-STAT PO LIQD Oral Take 30 mLs by mouth daily.    . ASPIRIN-DIPYRIDAMOLE ER 25-200 MG PO CP12 Oral Take 1 capsule by mouth 2 (two) times daily.    . DONEPEZIL HCL 10 MG PO TABS Oral Take 10 mg by mouth every evening.    Marland Kitchen FLUTICASONE PROPIONATE 50 MCG/ACT NA SUSP Nasal Place 2 sprays into the nose daily.    Marland Kitchen FOLIC ACID-VIT B6-VIT B12 2.5-25-1 MG PO TABS Oral Take 1 tablet by mouth daily.    Marland Kitchen LISINOPRIL 2.5 MG PO TABS Oral Take 2.5 mg by mouth daily.    Marland Kitchen LOPERAMIDE HCL 2 MG PO CAPS Oral Take 2 mg by mouth See admin instructions. TAKE 2 CAPS BY MOUTH AFTER FIRST LOOSE STOOL THEN TAKE 1 CAPSULE AFTER EACH LOOSE STOOL. DO NOT EXCEED 10MG /24HRS.    Marland Kitchen  MEMANTINE HCL 10 MG PO TABS Oral Take 10 mg by mouth 2 (two) times daily.    Marland Kitchen METOPROLOL SUCCINATE ER 25 MG PO TB24 Oral Take 25 mg by mouth daily.    . ADULT MULTIVITAMIN W/MINERALS CH Oral Take 1 tablet by mouth daily.    Marland Kitchen POLYETHYLENE GLYCOL 3350 PO POWD Oral Take 17 g by mouth daily as needed. FOR CONSTIPATION    . RANITIDINE HCL 150 MG PO TABS Oral Take 150 mg by mouth every evening.    . TRAVOPROST (BAK FREE) 0.004 % OP SOLN Both Eyes Place 1 drop into both eyes at bedtime.    . VENLAFAXINE HCL ER 75 MG PO CP24 Oral Take 75 mg by mouth daily.    Marland Kitchen VITAMIN D (ERGOCALCIFEROL) 50000 UNITS PO CAPS Oral Take 50,000 Units by mouth every 7 (seven) days. TAKES ON THURSDAY      BP 94/62  Pulse 94  Temp 97.8 F (36.6 C) (Oral)  Resp 25  SpO2 97%  Physical Exam  Nursing note and vitals reviewed. 77year old female, resting comfortably and in no acute distress. Vital signs are significant for  tachypnea with respiratory rate of 25, and borderline low blood pressure of 94/62. Oxygen saturation is 97%, which is normal. Head is normocephalic and atraumatic. Pupils are 3 mm, EOMI-she does not cooperate for testing pupils beyond this-she closes her eyes tightly with any attempt to evaluate pupillary response. I am not able to funduscopic exam for the same reason.. Oropharynx is clear. Neck is nontender and supple without adenopathy or JVD. There is no bruit present. Back is nontender and there is no CVA tenderness. Lungs are clear without rales, wheezes, or rhonchi. Chest is nontender. Heart has regular rate and rhythm without murmur. Abdomen is soft, flat, nontender without masses or hepatosplenomegaly and peristalsis is normoactive. Extremities have no cyanosis or edema, full range of motion is present. Skin is warm and dry without rash. Neurologic: She is awake and alert but that is needed and only follows very simple commands and does not do this consistently. Cranial nerves are grossly intact. Right hand grip seems a little bit awake rhythm left hand grip, and she does not seem to have as much strength in her right arm as she has in her left arm, but this is very difficult to assess because of poor to absent patient effort.   ED Course  Procedures (including critical care time)  Results for orders placed during the hospital encounter of 12/05/11  CBC      Component Value Range   WBC 6.8  4.0 - 10.5 K/uL   RBC 4.70  3.87 - 5.11 MIL/uL   Hemoglobin 13.1  12.0 - 15.0 g/dL   HCT 62.1  30.8 - 65.7 %   MCV 86.0  78.0 - 100.0 fL   MCH 27.9  26.0 - 34.0 pg   MCHC 32.4  30.0 - 36.0 g/dL   RDW 84.6  96.2 - 95.2 %   Platelets 173  150 - 400 K/uL  COMPREHENSIVE METABOLIC PANEL      Component Value Range   Sodium 144  135 - 145 mEq/L   Potassium 4.0  3.5 - 5.1 mEq/L   Chloride 108  96 - 112 mEq/L   CO2 21  19 - 32 mEq/L   Glucose, Bld 225 (*) 70 - 99 mg/dL   BUN 65 (*) 6 - 23 mg/dL     Creatinine, Ser 8.41 (*) 0.50 - 1.10 mg/dL  Calcium 9.4  8.4 - 10.5 mg/dL   Total Protein 7.0  6.0 - 8.3 g/dL   Albumin 3.1 (*) 3.5 - 5.2 g/dL   AST 19  0 - 37 U/L   ALT 25  0 - 35 U/L   Alkaline Phosphatase 77  39 - 117 U/L   Total Bilirubin 0.2 (*) 0.3 - 1.2 mg/dL   GFR calc non Af Amer 39 (*) >90 mL/min   GFR calc Af Amer 45 (*) >90 mL/min  URINALYSIS, ROUTINE W REFLEX MICROSCOPIC      Component Value Range   Color, Urine YELLOW  YELLOW   APPearance CLOUDY (*) CLEAR   Specific Gravity, Urine 1.029  1.005 - 1.030   pH 5.0  5.0 - 8.0   Glucose, UA NEGATIVE  NEGATIVE mg/dL   Hgb urine dipstick NEGATIVE  NEGATIVE   Bilirubin Urine NEGATIVE  NEGATIVE   Ketones, ur NEGATIVE  NEGATIVE mg/dL   Protein, ur 147 (*) NEGATIVE mg/dL   Urobilinogen, UA 1.0  0.0 - 1.0 mg/dL   Nitrite NEGATIVE  NEGATIVE   Leukocytes, UA LARGE (*) NEGATIVE  GLUCOSE, CAPILLARY      Component Value Range   Glucose-Capillary 204 (*) 70 - 99 mg/dL  URINE MICROSCOPIC-ADD ON      Component Value Range   Squamous Epithelial / LPF RARE  RARE   WBC, UA 11-20  <3 WBC/hpf   RBC / HPF 0-2  <3 RBC/hpf   Bacteria, UA MANY (*) RARE   Ct Head Wo Contrast  12/06/2011  *RADIOLOGY REPORT*  Clinical Data: Altered mental status  CT HEAD WITHOUT CONTRAST  Technique:  Contiguous axial images were obtained from the base of the skull through the vertex without contrast.  Comparison: 03/02/2009  Findings: No evidence of parenchymal hemorrhage or extra-axial fluid collection. No mass lesion, mass effect, or midline shift.  No CT evidence of acute infarction.  Old right frontal and left parietal infarcts with associated encephalomalacic changes.  Subcortical white matter and periventricular small vessel ischemic changes.  Global cortical atrophy.  Stable ventriculomegaly.  Ex vacuo dilatation of the frontal horn of the right lateral ventricle and occipital horn of the left lateral ventricle.  The visualized paranasal sinuses are  essentially clear. The mastoid air cells are unopacified.  No evidence of calvarial fracture.  IMPRESSION: No evidence of acute intracranial abnormality.  Old right frontal and left parietal infarcts with associated encephalomalacic changes.  Atrophy with stable ventriculomegaly and small vessel ischemic changes.   Original Report Authenticated By: Charline Bills, M.D.    Dg Chest Portable 1 View  12/05/2011  *RADIOLOGY REPORT*  Clinical Data: Altered mental status.  Hypertension and asthma  PORTABLE CHEST - 1 VIEW  Comparison: 07/23/2006  Findings: Heart size is mildly enlarged.  No pleural effusion or edema.  No airspace consolidation.  There is a moderate-sized hiatal hernia identified.  IMPRESSION:  1. Cardiac enlargement. 2.  Hiatal hernia.   Original Report Authenticated By: Rosealee Albee, M.D.       Date: 12/05/2011  Rate: 92  Rhythm: normal sinus rhythm  QRS Axis: normal  Intervals: PR prolonged  ST/T Wave abnormalities: normal  Conduction Disutrbances:first-degree A-V block   Narrative Interpretation: Degree AV block, left ventricular hypertrophy, poor R-wave progression across the precordium. No prior ECG available for comparison.  Old EKG Reviewed: none available    1. Altered mental status   2. Urinary tract infection   3. Prerenal renal failure  MDM  Altered mental status in patient with prior CVAs. This most likely represents a new CVA. CT scan has been ordered as well as laboratory workup. Urinalysis will be obtained to rule out occult urinary tract infection.  Workup is significant for evidence of urinary tract infection, and also evidence of dehydration with elevated BUN compared with baseline she will be given IV hydration initially started on Rocephin for her urinary tract infection. Case is discussed with Dr. Julian Reil of [facility] who agrees to admit the patient.      Dione Booze, MD 12/06/11 [phone]

## 2011-12-06 ENCOUNTER — Inpatient Hospital Stay (HOSPITAL_COMMUNITY): Payer: Medicare Other

## 2011-12-06 ENCOUNTER — Encounter (HOSPITAL_COMMUNITY): Payer: Self-pay | Admitting: Emergency Medicine

## 2011-12-06 DIAGNOSIS — R4182 Altered mental status, unspecified: Secondary | ICD-10-CM

## 2011-12-06 DIAGNOSIS — N390 Urinary tract infection, site not specified: Principal | ICD-10-CM

## 2011-12-06 DIAGNOSIS — I6990 Unspecified sequelae of unspecified cerebrovascular disease: Secondary | ICD-10-CM | POA: Clinically undetermined

## 2011-12-06 DIAGNOSIS — F068 Other specified mental disorders due to known physiological condition: Secondary | ICD-10-CM

## 2011-12-06 DIAGNOSIS — I6350 Cerebral infarction due to unspecified occlusion or stenosis of unspecified cerebral artery: Secondary | ICD-10-CM

## 2011-12-06 DIAGNOSIS — F0280 Dementia in other diseases classified elsewhere without behavioral disturbance: Secondary | ICD-10-CM

## 2011-12-06 DIAGNOSIS — N289 Disorder of kidney and ureter, unspecified: Secondary | ICD-10-CM | POA: Diagnosis present

## 2011-12-06 DIAGNOSIS — R4701 Aphasia: Secondary | ICD-10-CM

## 2011-12-06 LAB — GLUCOSE, CAPILLARY
Glucose-Capillary: 117 mg/dL — ABNORMAL HIGH (ref 70–99)
Glucose-Capillary: 92 mg/dL (ref 70–99)

## 2011-12-06 LAB — LIPID PANEL
LDL Cholesterol: 96 mg/dL (ref 0–99)
Total CHOL/HDL Ratio: 4.6 RATIO
VLDL: 30 mg/dL (ref 0–40)

## 2011-12-06 LAB — CREATININE, SERUM
Creatinine, Ser: 1.33 mg/dL — ABNORMAL HIGH (ref 0.50–1.10)
GFR calc Af Amer: 44 mL/min — ABNORMAL LOW (ref >90)
GFR calc non Af Amer: 38 mL/min — ABNORMAL LOW (ref >90)

## 2011-12-06 LAB — HEMOGLOBIN A1C: Mean Plasma Glucose: 134 mg/dL — ABNORMAL HIGH (ref <117)

## 2011-12-06 LAB — MRSA PCR SCREENING: MRSA by PCR: NEGATIVE

## 2011-12-06 LAB — CBC
MCV: 86.6 fL (ref 78.0–100.0)
Platelets: 160 10*3/uL (ref 150–400)
RBC: 4.85 MIL/uL (ref 3.87–5.11)
RDW: 15.3 % (ref 11.5–15.5)
WBC: 7.2 10*3/uL (ref 4.0–10.5)

## 2011-12-06 MED ORDER — DEXTROSE 5 % IV SOLN
1.0000 g | Freq: Once | INTRAVENOUS | Status: AC
  Administered 2011-12-06: 1 g via INTRAVENOUS
  Filled 2011-12-06: qty 10

## 2011-12-06 MED ORDER — HEPARIN SODIUM (PORCINE) 5000 UNIT/ML IJ SOLN
5000.0000 [IU] | Freq: Three times a day (TID) | INTRAMUSCULAR | Status: DC
  Administered 2011-12-06 – 2011-12-08 (×8): 5000 [IU] via SUBCUTANEOUS
  Filled 2011-12-06 (×10): qty 1

## 2011-12-06 MED ORDER — DONEPEZIL HCL 10 MG PO TABS
10.0000 mg | ORAL_TABLET | Freq: Every evening | ORAL | Status: DC
  Administered 2011-12-06 – 2011-12-07 (×2): 10 mg via ORAL
  Filled 2011-12-06 (×3): qty 1

## 2011-12-06 MED ORDER — SODIUM CHLORIDE 0.9 % IV SOLN
INTRAVENOUS | Status: DC
  Administered 2011-12-06 – 2011-12-08 (×4): via INTRAVENOUS

## 2011-12-06 MED ORDER — FOLIC ACID-VIT B6-VIT B12 2.5-25-1 MG PO TABS
1.0000 | ORAL_TABLET | Freq: Every day | ORAL | Status: DC
  Administered 2011-12-06: 1 via ORAL
  Filled 2011-12-06 (×2): qty 1

## 2011-12-06 MED ORDER — LORAZEPAM 2 MG/ML IJ SOLN
1.0000 mg | Freq: Once | INTRAMUSCULAR | Status: DC
  Filled 2011-12-06: qty 1

## 2011-12-06 MED ORDER — INSULIN ASPART 100 UNIT/ML ~~LOC~~ SOLN
0.0000 [IU] | Freq: Three times a day (TID) | SUBCUTANEOUS | Status: DC
  Administered 2011-12-06: 5 [IU] via SUBCUTANEOUS
  Administered 2011-12-06 – 2011-12-07 (×3): 3 [IU] via SUBCUTANEOUS
  Administered 2011-12-07 – 2011-12-08 (×3): 2 [IU] via SUBCUTANEOUS

## 2011-12-06 MED ORDER — ASPIRIN-DIPYRIDAMOLE ER 25-200 MG PO CP12
1.0000 | ORAL_CAPSULE | Freq: Two times a day (BID) | ORAL | Status: DC
  Administered 2011-12-06 – 2011-12-08 (×5): 1 via ORAL
  Filled 2011-12-06 (×7): qty 1

## 2011-12-06 MED ORDER — SENNOSIDES-DOCUSATE SODIUM 8.6-50 MG PO TABS: 1.0000 | ORAL_TABLET | Freq: Every evening | ORAL | Status: DC | PRN

## 2011-12-06 MED ORDER — MEMANTINE HCL 10 MG PO TABS
10.0000 mg | ORAL_TABLET | Freq: Two times a day (BID) | ORAL | Status: DC
  Administered 2011-12-06 – 2011-12-08 (×5): 10 mg via ORAL
  Filled 2011-12-06 (×6): qty 1

## 2011-12-06 MED ORDER — TRAVOPROST (BAK FREE) 0.004 % OP SOLN
1.0000 [drp] | Freq: Every day | OPHTHALMIC | Status: DC
  Administered 2011-12-07 (×2): 1 [drp] via OPHTHALMIC
  Filled 2011-12-06: qty 2.5

## 2011-12-06 MED ORDER — ADULT MULTIVITAMIN W/MINERALS CH
1.0000 | ORAL_TABLET | Freq: Every day | ORAL | Status: DC
  Administered 2011-12-06 – 2011-12-08 (×3): 1 via ORAL
  Filled 2011-12-06 (×3): qty 1

## 2011-12-06 MED ORDER — VENLAFAXINE HCL ER 75 MG PO CP24
75.0000 mg | ORAL_CAPSULE | Freq: Every day | ORAL | Status: DC
  Administered 2011-12-06 – 2011-12-08 (×3): 75 mg via ORAL
  Filled 2011-12-06 (×3): qty 1

## 2011-12-06 MED ORDER — VITAMIN D (ERGOCALCIFEROL) 1.25 MG (50000 UNIT) PO CAPS: 50000.0000 [IU] | ORAL_CAPSULE | ORAL | Status: DC

## 2011-12-06 MED ORDER — DEXTROSE 5 % IV SOLN
1.0000 g | INTRAVENOUS | Status: DC
  Administered 2011-12-06 – 2011-12-08 (×2): 1 g via INTRAVENOUS
  Filled 2011-12-06 (×3): qty 10

## 2011-12-06 MED ORDER — ATORVASTATIN CALCIUM 40 MG PO TABS
40.0000 mg | ORAL_TABLET | Freq: Every day | ORAL | Status: DC
  Administered 2011-12-06 – 2011-12-07 (×2): 40 mg via ORAL
  Filled 2011-12-06 (×3): qty 1

## 2011-12-06 MED ORDER — FLUTICASONE PROPIONATE 50 MCG/ACT NA SUSP
2.0000 | Freq: Every day | NASAL | Status: DC
  Administered 2011-12-06 – 2011-12-08 (×3): 2 via NASAL
  Filled 2011-12-06: qty 16

## 2011-12-06 NOTE — Progress Notes (Signed)
VASCULAR LAB PRELIMINARY  PRELIMINARY  PRELIMINARY  PRELIMINARY  Carotid Dopplers completed.    Preliminary report:  There is no left ICA stenosis.  Vertebral artery flow is antegrade.  Unable to scan right side due to patient's inability to keep head turned secondary to somnolence and confusion.  Sharia Averitt, 12/06/2011, 12:36 PM

## 2011-12-06 NOTE — ED Notes (Signed)
Report received from [facility] pt current nurse who states patient condition as stable and waiting orders for admission and family members at the patient bedside.

## 2011-12-06 NOTE — ED Notes (Signed)
Admitting physician at beside to write orders

## 2011-12-06 NOTE — Progress Notes (Signed)
Clinical Social Work Department BRIEF PSYCHOSOCIAL ASSESSMENT 12/06/2011  Patient:  [name], [name]     Account Number:  [id number]     Admit date:  12/05/2011  Clinical Social Worker: Johnsie Cancel,  Date/Time:  12/06/2011 10:45 AM  Referred by:  RN  Date Referred:  12/06/2011 Referred for  SNF Placement   Other Referral:   Interview type:  Family Other interview type:    PSYCHOSOCIAL DATA Living Status:  FACILITY Admitted from facility:  [name] Level of care:  Skilled Nursing Facility Primary support name:  Colvin Caroli Primary support relationship to patient:  [place] Degree of support available:   Adequate    CURRENT CONCERNS Current Concerns  Post-Acute Placement   Other Concerns:    SOCIAL WORK ASSESSMENT / PLAN CSW consulted by RN re: patient being from a SNF. CSW contacted the patient's sister Colvin Caroli [phone]) due to an altered mental status. CSW provided the sister with supportive counseling around the patient's current admission. CSW inquired if the patient will be returning to [facility] and the patient's sister stated she would like to complete a new bed search. The sister stated she would prefer the patient to go somewhere different if possible. CSW started a new bed search, and weekday CSW will follow up with bed offers.   Assessment/plan status:  Information/Referral to [facility] Other assessment/ plan:   Information/referral to community resources:    PATIENT'S/FAMILY'S RESPONSE TO PLAN OF CARE: Patient's sister thanked CSW for the support provided and facilitating a new bed search.    Lia Foyer, LCSWA [facility] Clinical Social Worker Contact #: [phone] (weekend)

## 2011-12-06 NOTE — H&P (Addendum)
[facility] History and Physical  [name] WUJ:[id number] DOB: 19-May-1934 DOA: 12/05/2011  Referring physician: ED PCP: Ron Parker, MD   Chief Complaint: AMS  HPI: [name] is a 77 y.o. female who presents from the nursing home at request of her family with 1 to 2 day history of worsening AMS.  Specifically the patient who is in a nursing home at baseline is not using her R arm the way she normally does, not following commands (per family although she is during my exam), not talking at all.  This is a dramatic change for the patient.  The patient normally does have significant deficits at baseline per family stemming from her 2 prior strokes which is why she is in a nursing home.  The patient is able to nod yes or no to a few simple questions today: yes she feels that 1 side of her body is weaker than normal and yes this is new, she indicates that it is the L side of her body that is weaker than it normally has been at baseline.  Review of Systems: A full review of systems cannot really be completed due to patient's condition.  Past Medical History  Diagnosis Date  . Left hemiplegia   . Aphasia   . Muscle weakness   . Diabetes mellitus   . Malaise   . Fatigue   . Breast neoplasm   . Hypertension   . Glaucoma   . UTI (lower urinary tract infection)   . Vitamin d deficiency   . Hiatal hernia   . Constipation   . Diarrhea   . Asthma   . Obesity   . Hyperlipidemia   . Alzheimer's dementia   . Depression   . Allergic rhinitis   . Dementia   . Stroke     infarct    History reviewed. No pertinent past surgical history. Social History:  reports that she has never smoked. She does not have any smokeless tobacco history on file. She reports that she does not drink alcohol or use illicit drugs. Patient lives in nursing home, has significant amount of assistance required with most ADLs including feeding herself.  Allergies  Allergen Reactions  . Penicillins  Other (See Comments)    Per MAR  . Tetanus Toxoids Other (See Comments)    Per MAR  . Vicodin (Hydrocodone-Acetaminophen) Other (See Comments)    Per MAR    History reviewed. No pertinent family history.   Prior to Admission medications   Medication Sig Start Date End Date Taking? Authorizing Provider  amLODipine (NORVASC) 5 MG tablet Take 5 mg by mouth daily.   Yes Historical Provider, MD  atorvastatin (LIPITOR) 40 MG tablet Take 40 mg by mouth daily.   Yes Historical Provider, MD  cloNIDine (CATAPRES - DOSED IN MG/24 HR) 0.1 mg/24hr patch Place 1 patch onto the skin once a week. APPLY NEW PATCH ON SATURDAY   Yes Historical Provider, MD  Cranberry-Vitamin C-Inulin (UTI-STAT) LIQD Take 30 mLs by mouth daily.   Yes Historical Provider, MD  dipyridamole-aspirin (AGGRENOX) 200-25 MG per 12 hr capsule Take 1 capsule by mouth 2 (two) times daily.   Yes Historical Provider, MD  donepezil (ARICEPT) 10 MG tablet Take 10 mg by mouth every evening.   Yes Historical Provider, MD  fluticasone (FLONASE) 50 MCG/ACT nasal spray Place 2 sprays into the nose daily.   Yes Historical Provider, MD  Folic Acid-Vit B6-Vit B12 (FOLBEE) 2.5-25-1 MG TABS Take 1 tablet by  mouth daily.   Yes Historical Provider, MD  lisinopril (PRINIVIL,ZESTRIL) 2.5 MG tablet Take 2.5 mg by mouth daily.   Yes Historical Provider, MD  loperamide (IMODIUM) 2 MG capsule Take 2 mg by mouth See admin instructions. TAKE 2 CAPS BY MOUTH AFTER FIRST LOOSE STOOL THEN TAKE 1 CAPSULE AFTER EACH LOOSE STOOL. DO NOT EXCEED 10MG /24HRS.   Yes Historical Provider, MD  memantine (NAMENDA) 10 MG tablet Take 10 mg by mouth 2 (two) times daily.   Yes Historical Provider, MD  metoprolol succinate (TOPROL-XL) 25 MG 24 hr tablet Take 25 mg by mouth daily.   Yes Historical Provider, MD  Multiple Vitamin (MULTIVITAMIN WITH MINERALS) TABS Take 1 tablet by mouth daily.   Yes Historical Provider, MD  polyethylene glycol powder (GLYCOLAX/MIRALAX) powder Take 17 g  by mouth daily as needed. FOR CONSTIPATION   Yes Historical Provider, MD  ranitidine (ZANTAC) 150 MG tablet Take 150 mg by mouth every evening.   Yes Historical Provider, MD  Travoprost, BAK Free, (TRAVATAN) 0.004 % SOLN ophthalmic solution Place 1 drop into both eyes at bedtime.   Yes Historical Provider, MD  venlafaxine XR (EFFEXOR-XR) 75 MG 24 hr capsule Take 75 mg by mouth daily.   Yes Historical Provider, MD  Vitamin D, Ergocalciferol, (DRISDOL) 50000 UNITS CAPS Take 50,000 Units by mouth every 7 (seven) days. TAKES ON THURSDAY   Yes Historical Provider, MD   Physical Exam: Filed Vitals:   12/06/11 0030 12/06/11 0106 12/06/11 0130 12/06/11 0135  BP: 127/82 119/74 124/79   Pulse: 83  92   Temp:  98 F (36.7 C)    TempSrc:      Resp: 25 22 23    SpO2: 100% 100% 99% 97%     General:  NAD, resting in bed  Eyes: PEERLA EOMI  ENT: moist mucous membranes  Neck: supple w/o JVD  Cardiovascular: RRR patient does have a SEM best heard at the 2nd left intercostal, the patient indicates she has a history of a murmur.  Respiratory: CTA B  Abdomen: soft, nt, nd, bs+  Skin: without obvious rashes or lesions  Musculoskeletal: the patient demonstrates 4+/5 strength in her LUE, 4-/5 RUE, dosent really show me much movement of BLE to command.  Neurologic: motor deficits as noted above, patient toung protrusion appears to be midline, she is using all facial muscles as near as i can tell.  Sensation appears grossly intact but again patient is only participating at times in the neuro exam but not at others.  Labs on Admission:  Basic Metabolic Panel:  Lab 12/05/11 4098  NA 144  K 4.0  CL 108  CO2 21  GLUCOSE 225*  BUN 65*  CREATININE 1.30*  CALCIUM 9.4  MG --  PHOS --   Liver Function Tests:  Lab 12/05/11 2245  AST 19  ALT 25  ALKPHOS 77  BILITOT 0.2*  PROT 7.0  ALBUMIN 3.1*   No results found for this basename: LIPASE:5,AMYLASE:5 in the last 168 hours No results found  for this basename: AMMONIA:5 in the last 168 hours CBC:  Lab 12/05/11 2245  WBC 6.8  NEUTROABS --  HGB 13.1  HCT 40.4  MCV 86.0  PLT 173   Cardiac Enzymes: No results found for this basename: CKTOTAL:5,CKMB:5,CKMBINDEX:5,TROPONINI:5 in the last 168 hours  BNP (last 3 results) No results found for this basename: PROBNP:3 in the last 8760 hours CBG:  Lab 12/05/11 2301  GLUCAP 204*    Radiological Exams on Admission: Ct Head  Wo Contrast  12/06/2011  *RADIOLOGY REPORT*  Clinical Data: Altered mental status  CT HEAD WITHOUT CONTRAST  Technique:  Contiguous axial images were obtained from the base of the skull through the vertex without contrast.  Comparison: 03/02/2009  Findings: No evidence of parenchymal hemorrhage or extra-axial fluid collection. No mass lesion, mass effect, or midline shift.  No CT evidence of acute infarction.  Old right frontal and left parietal infarcts with associated encephalomalacic changes.  Subcortical white matter and periventricular small vessel ischemic changes.  Global cortical atrophy.  Stable ventriculomegaly.  Ex vacuo dilatation of the frontal horn of the right lateral ventricle and occipital horn of the left lateral ventricle.  The visualized paranasal sinuses are essentially clear. The mastoid air cells are unopacified.  No evidence of calvarial fracture.  IMPRESSION: No evidence of acute intracranial abnormality.  Old right frontal and left parietal infarcts with associated encephalomalacic changes.  Atrophy with stable ventriculomegaly and small vessel ischemic changes.   Original Report Authenticated By: Charline Bills, M.D.    Dg Chest Portable 1 View  12/05/2011  *RADIOLOGY REPORT*  Clinical Data: Altered mental status.  Hypertension and asthma  PORTABLE CHEST - 1 VIEW  Comparison: 07/23/2006  Findings: Heart size is mildly enlarged.  No pleural effusion or edema.  No airspace consolidation.  There is a moderate-sized hiatal hernia identified.   IMPRESSION:  1. Cardiac enlargement. 2.  Hiatal hernia.   Original Report Authenticated By: Rosealee Albee, M.D.     EKG: Independently reviewed.  Assessment/Plan Principal Problem:  *UTI (lower urinary tract infection) Active Problems:  Left hemiplegia  Aphasia  Muscle weakness  Stroke  Acute kidney insufficiency   1. UTI - certainly this patient has a UTI which could be contributing to or even causing all of her AMS symptoms, we will treat this UTI with rocephin as an inpatient. 2. Acute kidney insufficiency - with BUN elevated significantly from baseline - also may be contributing to her symptoms and likely secondary to problem #1 will order fluids, hold nephrotoxic medications. 3. H/O HTN - allowing permissive HTN at this time due to concern for possible stroke given the stroke like symptoms with new onset worse aphasia, R side weaker than normal per patient. 4. Possible Stroke - unclear wether this is present on admission, permissive HTN, also ordering MRI of brain to work this up. 5. AMS - most likely due to UTI but cannot rule out possible stroke in this patient with known history of 2 previous strokes, will allow permissive HTN and order MRI brain as above. 6. H/o DM2 - will go ahead and start SSI while here inpatient for glycemic control.  Code Status: Full Code for now Family Communication: Spoke with daughter and grandson who are at bedside, communicated that this is most likely a UTI but we had to have a high index of suspicion and concern for stroke given the fact that patient has had 2 strokes previously. Disposition Plan: Admit to inpatient  Time spent: 70 min  Denim Kalmbach M. [facility] Pager [phone]  If 7PM-7AM, please contact night-coverage [email or web address] Password [facility] 12/06/2011, 2:42 AM

## 2011-12-06 NOTE — Progress Notes (Signed)
Patient admitted earlier today from SNF with AMS changes beyond her baseline. She has had 2 CVAs in the past and the family was concerned for a repeat event. In the ED she was found to have a UTI (which could certainly be contributing to her acute encephalopathy). Has been started on rocephin pending urine culture data. MRI and rest of stroke workup is currently in process. Per SW notes, it appears patient's family is requesting a change in facilities. Will continue to follow.  Peggye Pitt, MD [facility] Pager: [phone]

## 2011-12-06 NOTE — Progress Notes (Addendum)
Clinical Social Work Department CLINICAL SOCIAL WORK PLACEMENT NOTE 12/06/2011  Patient:  [name], [name]  Account Number:  [id number] Admit date:  12/05/2011  Clinical Social Worker: Johnsie Cancel  ,  Date/time:  12/06/2011 10:43 AM  Clinical Social Work is seeking post-discharge placement for this patient at the following level of care:   SKILLED NURSING   (*CSW will update this form in Epic as items are completed)   12/06/2011  Patient/family provided with [name] Department of Clinical Social Work's list of facilities offering this level of care within the geographic area requested by the patient (or if unable, by the patient's family).  12/06/2011  Patient/family informed of their freedom to choose among providers that offer the needed level of care, that participate in Medicare, Medicaid or managed care program needed by the patient, have an available bed and are willing to accept the patient.  12/06/2011  Patient/family informed of [facility]' ownership interest in [facility], as well as of the fact that they are under no obligation to receive care at this facility.  PASARR submitted to EDS on 07/15/2006 PASARR number received from EDS on 07/15/2006  FL2 transmitted to all facilities in geographic area requested by pt/family on  12/06/2011 FL2 transmitted to all facilities within larger geographic area on   Patient informed that his/her managed care company has contracts with or will negotiate with  certain facilities, including the following:     Patient/family informed of bed offers received: 12/08/11  Patient chooses bed at [facility] Physician recommends and patient chooses bed at    Patient to be transferred to [facility] on 12/08/11  Patient to be transferred to facility by ambulance  The following physician request were entered in Epic:   Additional Comments:  Lia Foyer, LCSWA [facility] Clinical Social Worker Contact  #: [phone] (weekend)

## 2011-12-07 LAB — GLUCOSE, CAPILLARY
Glucose-Capillary: 160 mg/dL — ABNORMAL HIGH (ref 70–99)
Glucose-Capillary: 146 mg/dL — ABNORMAL HIGH (ref 70–99)

## 2011-12-07 MED ORDER — FA-PYRIDOXINE-CYANOCOBALAMIN 2.5-25-2 MG PO TABS
1.0000 | ORAL_TABLET | Freq: Every day | ORAL | Status: DC
  Administered 2011-12-07 – 2011-12-08 (×2): 1 via ORAL
  Filled 2011-12-07 (×2): qty 1

## 2011-12-07 MED ORDER — CALCIUM CARBONATE ANTACID 500 MG PO CHEW
1.0000 | CHEWABLE_TABLET | Freq: Once | ORAL | Status: AC
  Administered 2011-12-07: 200 mg via ORAL
  Filled 2011-12-07: qty 1

## 2011-12-07 MED ORDER — AMLODIPINE BESYLATE 5 MG PO TABS
5.0000 mg | ORAL_TABLET | Freq: Every day | ORAL | Status: DC
  Administered 2011-12-07 – 2011-12-08 (×2): 5 mg via ORAL
  Filled 2011-12-07 (×2): qty 1

## 2011-12-07 NOTE — Progress Notes (Signed)
Nutrition Brief Note  RD drawn to chart 2/2 Low Braden score of 12. Noted on nutrition section of Braden Scale, Nutrition is rated as "Adequate." Reviewed nursing home records, pt wasn't on any oral nutritional supplements at facility. Pt states that she is eating well presently and was eating well PTA.  Body mass index is 35.50 kg/(m^2). Pt meets criteria for Obesity Class II based on current BMI.   Current diet order is Carbohydrate Modified Medium, patient is consuming approximately 100% of meals at this time. Labs and medications reviewed. No skin breakdown.  No nutrition interventions warranted at this time. If nutrition issues arise, please consult RD.   Jarold Motto MS, RD, LDN Pager: [phone] After-hours pager: [phone]

## 2011-12-07 NOTE — Evaluation (Signed)
Speech Language Pathology Evaluation Patient Details Name: [name] MRN: [id number] DOB: Aug 01, 1934 Today's Date: 12/07/2011 Time: 7829-5621 SLP Time Calculation (min): 13 min  Problem List:  Patient Active Problem List  Diagnosis  . Left hemiplegia  . Aphasia  . Muscle weakness  . Malaise  . Fatigue  . Breast neoplasm  . Glaucoma  . UTI (lower urinary tract infection)  . Vitamin d deficiency  . Hiatal hernia  . Constipation  . Diarrhea  . Obesity  . Hyperlipidemia  . Alzheimer's dementia  . Depression  . Allergic rhinitis  . Dementia  . Stroke  . Acute kidney insufficiency   Past Medical History:  Past Medical History  Diagnosis Date  . Left hemiplegia   . Aphasia   . Muscle weakness   . Diabetes mellitus   . Malaise   . Fatigue   . Breast neoplasm   . Hypertension   . Glaucoma   . UTI (lower urinary tract infection)   . Vitamin d deficiency   . Hiatal hernia   . Constipation   . Diarrhea   . Asthma   . Obesity   . Hyperlipidemia   . Alzheimer's dementia   . Depression   . Allergic rhinitis   . Dementia   . Stroke     infarct    Past Surgical History: History reviewed. No pertinent past surgical history. HPI:  : [name] is a 77 y.o. female who presents from the nursing home at request of her family with 1 to 2 day history of worsening AMS.  Specifically the patient who is in a nursing home at baseline is not using her R arm the way she normally does, not following commands (per family although she is during my exam), not talking at all.  This is a dramatic change for the patient.   Assessment / Plan / Recommendation Clinical Impression  Patient is essentially non-verbal at this time, and does not f/c's, or consistently answer yes/no questions appropriately.  The MRI was negative for acute infarct.  This change in verbal output/AMS is likely due to encelpalopathy caused by her UTI.  It is suspected that this will resolve as her UTI clears.   Defer further SLP to SNF if symptoms persist.    SLP Assessment  All further Speech Lanaguage Pathology  needs can be addressed in the next venue of care    Follow Up Recommendations       Frequency and Duration        Pertinent Vitals/Pain n/a   SLP Goals     SLP Evaluation Prior Functioning  Cognitive/Linguistic Baseline: Baseline deficits Baseline deficit details: Patient unable to state; no family available.   Type of Home: Skilled Nursing Facility Lives With: Other (Comment) (SNF) Available Help at Discharge: Skilled Nursing Facility   Cognition  Overall Cognitive Status: Impaired at baseline (H/O of Alzheimer's dementia and 2 CVA's) Arousal/Alertness: Awake/alert Orientation Level:  (Patient unable to answer orientation questions) Attention: Focused Memory:  (Unable to assess) Awareness:  (Unable to assess) Problem Solving:  (Unable) Behaviors:  (Stares ahead; limited verbal attempts) Safety/Judgment: Impaired    Comprehension  Auditory Comprehension Overall Auditory Comprehension: Impaired Yes/No Questions: Impaired Basic Biographical Questions: 0-25% accurate Basic Immediate Environment Questions: 0-24% accurate Commands: Impaired One Step Basic Commands: 0-24% accurate Conversation:  (Unable ) Interfering Components: Attention;Processing speed;Motor planning EffectiveTechniques: Extra processing time;Slowed speech;Visual/Gestural cues Visual Recognition/Discrimination Discrimination: Not tested Reading Comprehension Reading Status: Unable to assess (comment)  Expression Expression Primary Mode of Expression:  (Does not express her basic needs) Verbal Expression Overall Verbal Expression: Impaired Initiation: Impaired Automatic Speech:  (Unable) Level of Generative/Spontaneous Verbalization:  (None) Repetition: Impaired Level of Impairment: Word level Naming: Impairment Responsive: 0-25% accurate Confrontation: Impaired Convergent: 0-24%  accurate Divergent: 0-24% accurate Written Expression Written Expression: Not tested   Oral / Motor Oral Motor/Sensory Function Labial ROM:  (Unable to f/c's) Motor Speech Overall Motor Speech: Impaired Phonation: Aphonic (One vocalization "ah"  imitated.) Articulation:  (Unable) Intelligibility: Unable to assess (comment) Motor Planning: Impaired Level of Impairment: Word Interfering Components: Premorbid status   GO     [name] T 12/07/2011, 1:14 PM

## 2011-12-07 NOTE — Progress Notes (Signed)
SLP Cancellation Note  SLP evaluation cancelled this am due to patient receiving procedure or test.  Chart reviewed, MRI negative for acute CVA, positive UTI may be reason for mental status changes.  Nursing reports patient still non-verbal, and only nods head for yes and no.  Will return for evaluation.  [name] T 12/07/2011, 9:58 AM

## 2011-12-07 NOTE — Progress Notes (Signed)
  Echocardiogram 2D Echocardiogram has been performed.  [name] 12/07/2011, 10:41 AM

## 2011-12-07 NOTE — Progress Notes (Signed)
[facility] PROGRESS NOTE  [name] ZOX:[id number] DOB: Nov 26, 1934 DOA: 12/05/2011 PCP: Ron Parker, MD  Assessment/Plan: 1-encephalopathy: slightly better; most likely secondary to UTI on top of underlying dementia. Will continue abx's, continue aricept and follow clinical response. Will also check B12 and TSH.  2-E. Coli UTI (lower urinary tract infection): sensitivity pending. Continue rocephin meanwhile. Patient afebrile.  3- right hemiplegia: new and appears to be related with encephalopathy; MRI and CT negative for new stroke. Will continue aggrenox.  4-Aphasia: no new stroke; appears to be related to dementia and encephalopathy 2/2 UTI. Will follow clinical response as infection clears up.  5-Hx of Stroke, with new stroke like symptoms: most likely 2/2 to UTI. Stroke work up so far essentially negative. Will continue statins, continue controlling BP and DM; will continue aggrenox.  6-Acute kidney failure: secondary to UTI and also mild dehydration with ongoing use of ACE inhibitors. Will continue antibiotics and also IVF's. BMET in AM; Cr 1.33 today.  7-DM: continue SSI   Code Status: Full Family Communication: no family at bedside Disposition Plan: SNF at discharge for physical rehab and care   Brief narrative: 77 y.o. female who presents from the nursing home at request of her family with 1 to 2 day history of worsening AMS. Specifically the patient who is in a nursing home at baseline is not using her R arm the way she normally does, not following commands (per family although she is during my exam), not talking at all. This is a dramatic change for the patient.    Consultants:  PT/OT  Procedures:  2-D echo (ordered but pending)  CT head and MRI brain  Antibiotics:  Rocephin (day #2)  HPI/Subjective: Patient afebrile; oriented only to person and able just to follow simple commands and answer yes or no at this point. Denies CP, SOB and/or  abdominal pain.    Objective: Filed Vitals:   12/06/11 2206 12/07/11 0436 12/07/11 0908 12/07/11 1307  BP: 148/86 148/93 127/68 145/91  Pulse: 86 96 105 103  Temp: 98.6 F (37 C) 98.5 F (36.9 C) 98.9 F (37.2 C) 97.7 F (36.5 C)  TempSrc: Oral Oral    Resp: 20 20 18 19   Height:      Weight: 102.8 kg (226 lb 10.1 oz)     SpO2: 90% 93% 98% 100%    Intake/Output Summary (Last 24 hours) at 12/07/11 1508 Last data filed at 12/07/11 1308  Gross per 24 hour  Intake    300 ml  Output      0 ml  Net    300 ml   Filed Weights   12/06/11 0412 12/06/11 2206  Weight: 99.7 kg (219 lb 12.8 oz) 102.8 kg (226 lb 10.1 oz)    Exam:   General:  Afebrile; able to answer yes or no and follow simple commands; oriented just to person  Cardiovascular: no rubs or gallops, S1 and S2  Respiratory: CTA bilaterally  Abdomen: soft, NT, ND; positive BS  Extremities: no edema, no cyanosis or clubbing  Neuro: slumped to right side; MS 3/5RUE and 4/5 LUE; 3/5 on lower extremities bilaterally; CN intact  Data Reviewed: Basic Metabolic Panel:  Lab 12/06/11 8119 12/05/11 2245  NA -- 144  K -- 4.0  CL -- 108  CO2 -- 21  GLUCOSE -- 225*  BUN -- 65*  CREATININE 1.33* 1.30*  CALCIUM -- 9.4  MG -- --  PHOS -- --   Liver Function Tests:  Lab 12/05/11  2245  AST 19  ALT 25  ALKPHOS 77  BILITOT 0.2*  PROT 7.0  ALBUMIN 3.1*   CBC:  Lab 12/06/11 0312 12/05/11 2245  WBC 7.2 6.8  NEUTROABS -- --  HGB 13.7 13.1  HCT 42.0 40.4  MCV 86.6 86.0  PLT 160 173   CBG:  Lab 12/07/11 1158 12/07/11 0735 12/06/11 2210 12/06/11 1900 12/06/11 1619  GLUCAP 146* 160* 173* 92 117*    Recent Results (from the past 240 hour(s))  URINE CULTURE     Status: Normal (Preliminary result)   Collection Time   12/05/11 11:17 PM      Component Value Range Status Comment   Specimen Description URINE, RANDOM   Final    Special Requests JY:NWGNF ON [id number] @0113    Final    Culture  Setup Time 12/06/2011  11:38   Final    Colony Count 80,000 COLONIES/ML   Final    Culture ESCHERICHIA COLI   Final    Report Status PENDING   Incomplete   MRSA PCR SCREENING     Status: Normal   Collection Time   12/06/11  6:30 AM      Component Value Range Status Comment   MRSA by PCR NEGATIVE  NEGATIVE Final      Studies: Ct Head Wo Contrast  12/06/2011  *RADIOLOGY REPORT*  Clinical Data: Altered mental status  CT HEAD WITHOUT CONTRAST  Technique:  Contiguous axial images were obtained from the base of the skull through the vertex without contrast.  Comparison: 03/02/2009  Findings: No evidence of parenchymal hemorrhage or extra-axial fluid collection. No mass lesion, mass effect, or midline shift.  No CT evidence of acute infarction.  Old right frontal and left parietal infarcts with associated encephalomalacic changes.  Subcortical white matter and periventricular small vessel ischemic changes.  Global cortical atrophy.  Stable ventriculomegaly.  Ex vacuo dilatation of the frontal horn of the right lateral ventricle and occipital horn of the left lateral ventricle.  The visualized paranasal sinuses are essentially clear. The mastoid air cells are unopacified.  No evidence of calvarial fracture.  IMPRESSION: No evidence of acute intracranial abnormality.  Old right frontal and left parietal infarcts with associated encephalomalacic changes.  Atrophy with stable ventriculomegaly and small vessel ischemic changes.   Original Report Authenticated By: Charline Bills, M.D.    Mr Brain Wo Contrast  12/06/2011  *RADIOLOGY REPORT*  Clinical Data:  Increasing in aphasia and generalized weakness.  MRI BRAIN WITHOUT CONTRAST MRA HEAD WITHOUT CONTRAST  Technique: Multiplanar, multiecho pulse sequences of the brain and surrounding structures were obtained according to standard protocol without intravenous contrast.  Angiographic images of the head were obtained using MRA technique without contrast.  Comparison: 12/05/2011 head CT.   08/13/2002 MR brain and MRA angiogram.  MRI HEAD  Findings:  No acute infarct.  Remote moderate to large left occipital - parietal lobe infarct. Remote moderate sized mid right frontal lobe infarct.  Remote small cerebellar infarcts more notable and larger on the right.  Prominent small vessel disease type changes.  Prominent global atrophy.  Ventricular prominence felt to to be related to atrophy rather than hydrocephalus.  No intracranial mass lesion detected on this unenhanced exam.  Mild cervical spondylotic changes C4-5.  IMPRESSION: No acute infarct.  Remote infarcts, small vessel disease type changes and atrophy as discussed above.  MRA HEAD  Findings: Motion degraded exam.  Ectatic vertical cervical segment of the left internal carotid artery.  1.2 mm aneurysm lateral aspect  of the right internal carotid artery distal vertical cervical/petrous segment genu. This appears stable from 2004 exam.  Mild to slightly moderate narrowing right internal carotid artery cavernous segment.  Mild narrowing A1 segment anterior cerebral artery bilaterally.  Middle cerebral artery branch vessel moderate to marked narrowing and irregularity.  Distal A2 segment anterior cerebral artery moderate narrowing and irregularity.  Left vertebral artery poorly delineated beyond the foramen magnum.  Nonvisualization PICAs and AICAs.  Mild narrowing proximal mid aspect basilar artery.  Moderate to marked narrowing involving portions of the posterior cerebral artery and superior cerebellar arteries bilaterally.  IMPRESSION: Prominent intracranial atherosclerotic type changes as noted above.  1.2 mm aneurysm lateral aspect of the right internal carotid artery distal vertical cervical/petrous segment genu.   Stable since 2004.   Original Report Authenticated By: Fuller Canada, M.D.    Dg Chest Portable 1 View  12/05/2011  *RADIOLOGY REPORT*  Clinical Data: Altered mental status.  Hypertension and asthma  PORTABLE CHEST - 1 VIEW   Comparison: 07/23/2006  Findings: Heart size is mildly enlarged.  No pleural effusion or edema.  No airspace consolidation.  There is a moderate-sized hiatal hernia identified.  IMPRESSION:  1. Cardiac enlargement. 2.  Hiatal hernia.   Original Report Authenticated By: Rosealee Albee, M.D.    Mr Mra Head/brain Wo Cm  12/06/2011  *RADIOLOGY REPORT*  Clinical Data:  Increasing in aphasia and generalized weakness.  MRI BRAIN WITHOUT CONTRAST MRA HEAD WITHOUT CONTRAST  Technique: Multiplanar, multiecho pulse sequences of the brain and surrounding structures were obtained according to standard protocol without intravenous contrast.  Angiographic images of the head were obtained using MRA technique without contrast.  Comparison: 12/05/2011 head CT.  08/13/2002 MR brain and MRA angiogram.  MRI HEAD  Findings:  No acute infarct.  Remote moderate to large left occipital - parietal lobe infarct. Remote moderate sized mid right frontal lobe infarct.  Remote small cerebellar infarcts more notable and larger on the right.  Prominent small vessel disease type changes.  Prominent global atrophy.  Ventricular prominence felt to to be related to atrophy rather than hydrocephalus.  No intracranial mass lesion detected on this unenhanced exam.  Mild cervical spondylotic changes C4-5.  IMPRESSION: No acute infarct.  Remote infarcts, small vessel disease type changes and atrophy as discussed above.  MRA HEAD  Findings: Motion degraded exam.  Ectatic vertical cervical segment of the left internal carotid artery.  1.2 mm aneurysm lateral aspect of the right internal carotid artery distal vertical cervical/petrous segment genu. This appears stable from 2004 exam.  Mild to slightly moderate narrowing right internal carotid artery cavernous segment.  Mild narrowing A1 segment anterior cerebral artery bilaterally.  Middle cerebral artery branch vessel moderate to marked narrowing and irregularity.  Distal A2 segment anterior cerebral  artery moderate narrowing and irregularity.  Left vertebral artery poorly delineated beyond the foramen magnum.  Nonvisualization PICAs and AICAs.  Mild narrowing proximal mid aspect basilar artery.  Moderate to marked narrowing involving portions of the posterior cerebral artery and superior cerebellar arteries bilaterally.  IMPRESSION: Prominent intracranial atherosclerotic type changes as noted above.  1.2 mm aneurysm lateral aspect of the right internal carotid artery distal vertical cervical/petrous segment genu.   Stable since 2004.   Original Report Authenticated By: Fuller Canada, M.D.     Scheduled Meds:   . amLODipine  5 mg Oral Daily  . atorvastatin  40 mg Oral q1800  . calcium carbonate  1 tablet Oral Once  .  cefTRIAXone (ROCEPHIN)  IV  1 g Intravenous Q24H  . dipyridamole-aspirin  1 capsule Oral BID  . donepezil  10 mg Oral QPM  . fluticasone  2 spray Each Nare Daily  . folic acid-pyridoxine-cyancobalamin  1 tablet Oral Daily  . heparin  5,000 Units Subcutaneous Q8H  . insulin aspart  0-15 Units Subcutaneous TID WC  . LORazepam  1 mg Intravenous Once  . memantine  10 mg Oral BID  . multivitamin with minerals  1 tablet Oral Daily  . Travoprost (BAK Free)  1 drop Both Eyes QHS  . venlafaxine XR  75 mg Oral Daily  . Vitamin D (Ergocalciferol)  50,000 Units Oral Q7 days  . DISCONTD: Folic Acid-Vit B6-Vit B12  1 tablet Oral Daily   Continuous Infusions:   . sodium chloride 75 mL/hr at 12/07/11 1310    Time spent: > 30 minutes    Rosario Kushner  [facility] Pager [phone]. If 8PM-8AM, please contact night-coverage at [email or web address], password [facility] 12/07/2011, 3:08 PM  LOS: 2 days

## 2011-12-07 NOTE — Progress Notes (Signed)
Received referral for LTAC for this pt, however pt is currently SNF resident and plan is to return to SNF. LTAC is not an appropriate level of care for this pt . Will continue to follow for progression and d/c needs.  Johny Shock RN MPH Case Manager [phone]

## 2011-12-07 NOTE — Progress Notes (Signed)
Physical Therapy Evaluation Patient Details Name: [name] MRN: [id number] DOB: January 23, 1935 Today's Date: 12/07/2011 Time: 4098-1191 PT Time Calculation (min): 25 min  PT Assessment / Plan / Recommendation Clinical Impression  77 yo female with lengthy PMH including 2 CVAs and dementia, admitted from SNF with AMS; imaging neg for CVA; findings are significant for UTI; Recommending return to SNF at dc;   While noted pt has declined from her baseline status and ability to communicate/participate in therapies, still could use more info as to pt's typical baseline functional status    PT Assessment  Patient needs continued PT services    Follow Up Recommendations  Skilled nursing facility    Barriers to Discharge None      Equipment Recommendations  Defer to next venue    Recommendations for Other Services     Frequency Min 2X/week    Precautions / Restrictions Precautions Precautions: Fall   Pertinent Vitals/Pain no apparent distress       Mobility  Bed Mobility Bed Mobility: Supine to Sit;Sitting - Scoot to Edge of Bed Supine to Sit: 1: +2 Total assist;HOB elevated Supine to Sit: Patient Percentage: 20% Sitting - Scoot to Edge of Bed: 1: +2 Total assist Sitting - Scoot to Edge of Bed: Patient Percentage: 30% Details for Bed Mobility Assistance: Requiring +2 assist for all movement in bed Transfers Transfers: Lateral/Scoot Transfers Lateral/Scoot Transfers: 1: +2 Total assist Lateral Transfers: Patient Percentage: 20% Details for Transfer Assistance: +2 assist giving support of R and L UEs and supporting and moving hips with bed pad; Blocked bil knees for stability during transfer; Armrest of recliner dropped Ambulation/Gait Ambulation/Gait Assistance: Not tested (comment)    Exercises     PT Diagnosis: Generalized weakness  PT Problem List: Decreased strength;Decreased range of motion;Decreased balance;Decreased mobility;Decreased coordination;Decreased  cognition PT Treatment Interventions: Functional mobility training;Therapeutic activities;Therapeutic exercise;Balance training;Neuromuscular re-education;Patient/family education   PT Goals Acute Rehab PT Goals PT Goal Formulation: Patient unable to participate in goal setting Time For Goal Achievement: 12/21/11 Potential to Achieve Goals: Fair Pt will go Supine/Side to Sit: with mod assist PT Goal: Supine/Side to Sit - Progress: Goal set today Pt will Sit at Edge of Bed: with mod assist PT Goal: Sit at Edge Of Bed - Progress: Goal set today Pt will go Sit to Supine/Side: with mod assist PT Goal: Sit to Supine/Side - Progress: Goal set today Pt will Transfer Bed to Chair/Chair to Bed: with mod assist PT Transfer Goal: Bed to Chair/Chair to Bed - Progress: Goal set today  Visit Information  Last PT Received On: 12/07/11 Assistance Needed: +2    Subjective Data  Subjective: Non verbal this session; possibly gave a small nod "yes" when asked if she wanted a blanket in recliner end of session Patient Stated Goal: unable to state   Prior Functioning  Home Living Lives With: Other (Comment) (SNF) Available Help at Discharge: Skilled Nursing Facility Type of Home: Skilled Nursing Facility Prior Function Level of Independence: Needs assistance Needs Assistance: Bathing;Dressing;Transfers;Grooming;Toileting Comments: Unable to obtain PLOF info from pt; Will need more detailed info from family or staff at SNF; per admitting note, family is stating that pt was more verbal and able to participate in her care prior to this episode Communication Communication: Expressive difficulties (non-verbal)    Cognition  Overall Cognitive Status: History of cognitive impairments - further impaired Area of Impairment: Attention Arousal/Alertness: Awake/alert Orientation Level: Other (comment) (unable to assess) Behavior During Session: Flat affect Current Attention Level: Focused  Attention - Other  Comments: Noted eyes move to speaker when you say her name Cognition - Other Comments: Not exactly sure of Ms. Comrie's baseline cognition    Extremity/Trunk Assessment Right Upper Extremity Assessment RUE ROM/Strength/Tone: Unable to fully assess;Due to impaired cognition Left Upper Extremity Assessment LUE ROM/Strength/Tone: Unable to fully assess;Due to impaired cognition Right Lower Extremity Assessment RLE ROM/Strength/Tone: Unable to fully assess;Due to impaired cognition Left Lower Extremity Assessment LLE ROM/Strength/Tone: Unable to fully assess;Due to impaired cognition   Balance Balance Balance Assessed: Yes Static Sitting Balance Static Sitting - Balance Support: Right upper extremity supported;Left upper extremity supported Static Sitting - Level of Assistance: 2: Max assist;4: Min assist;5: Stand by assistance Static Sitting - Comment/# of Minutes: EOB at least 5 minutes with Max assist at first, progressing to min and standby assist after first manually assisting pt to fully upright sitting, and then holding pt's right hand which was placed on her knee  End of Session PT - End of Session Equipment Utilized During Treatment:  (bed pad) Activity Tolerance: Patient tolerated treatment well Patient left: in chair;with call bell/phone within reach Nurse Communication: Mobility status;Need for lift equipment  GP     Van Clines [facility] [place], [place] [phone]  12/07/2011, 1:23 PM

## 2011-12-08 DIAGNOSIS — E559 Vitamin D deficiency, unspecified: Secondary | ICD-10-CM

## 2011-12-08 DIAGNOSIS — I10 Essential (primary) hypertension: Secondary | ICD-10-CM

## 2011-12-08 DIAGNOSIS — E785 Hyperlipidemia, unspecified: Secondary | ICD-10-CM

## 2011-12-08 DIAGNOSIS — M6281 Muscle weakness (generalized): Secondary | ICD-10-CM

## 2011-12-08 LAB — URINE CULTURE: Colony Count: 80000

## 2011-12-08 LAB — GLUCOSE, CAPILLARY: Glucose-Capillary: 128 mg/dL — ABNORMAL HIGH (ref 70–99)

## 2011-12-08 LAB — BASIC METABOLIC PANEL
BUN: 33 mg/dL — ABNORMAL HIGH (ref 6–23)
Calcium: 9.1 mg/dL (ref 8.4–10.5)
Creatinine, Ser: 0.94 mg/dL (ref 0.50–1.10)
GFR calc non Af Amer: 58 mL/min — ABNORMAL LOW (ref >90)
Glucose, Bld: 145 mg/dL — ABNORMAL HIGH (ref 70–99)
Sodium: 144 mEq/L (ref 135–145)

## 2011-12-08 MED ORDER — METOPROLOL SUCCINATE 12.5 MG HALF TABLET: 25.0000 mg | ORAL_TABLET | Freq: Every day | ORAL | Status: DC

## 2011-12-08 MED ORDER — PANTOPRAZOLE SODIUM 40 MG PO TBEC
40.0000 mg | DELAYED_RELEASE_TABLET | Freq: Every day | ORAL | Status: DC
  Administered 2011-12-08: 40 mg via ORAL
  Filled 2011-12-08: qty 1

## 2011-12-08 MED ORDER — LEVOFLOXACIN 500 MG PO TABS: 500.0000 mg | ORAL_TABLET | Freq: Every day | ORAL | Status: AC

## 2011-12-08 MED ORDER — PANTOPRAZOLE SODIUM 40 MG PO TBEC: 40.0000 mg | DELAYED_RELEASE_TABLET | Freq: Every day | ORAL | Status: DC

## 2011-12-08 NOTE — Progress Notes (Signed)
Pt discharged to SNF. Pt remains stable. No signs and symptoms of distress. Educated to return to ER in the event of SOB, dizziness, chest pain, or fainting. Simara Rhyner, RN  

## 2011-12-08 NOTE — Progress Notes (Signed)
Pt had 9 beat run of V. Tach. Reported at 90. VS: Bp; 142/34 P: 104 R: 20 O2: 96% on room air. Pt was asymptomatic. Craige Cotta, NP on call for [facility] was paged and notified, and received order to complete EKG. Craige Cotta paged page in regards to EKG being completed. Will continue to monitor patient.

## 2011-12-08 NOTE — Clinical Social Work Note (Signed)
CSW intern contacted patients sister [name] regarding patient's bed offers. CSW intern noted that the patient came from [facility] however, family expressed that they did not want the patient to return there. Patients family also requested information about other SNF's. Once bed offers were given, patients sister chose [facility]. Contact made with facility and confirmed that they could take patient today.   Discharge summary faxed to the facility. CSW intern facilitated transport via ambulance. Sister contacted and advised when patient in route to facility.    Genelle Bal, MSW, LCSW [phone]

## 2011-12-08 NOTE — Discharge Summary (Signed)
Physician Discharge Summary  [name] ZOX:[id number] DOB: [birth year] DOA: 12/05/2011  PCP: Ron Parker, MD  Admit date: 12/05/2011 Discharge date: 12/08/2011  Recommendations for Outpatient Follow-up:  1. PCP follow up in 2 weeks (Follow renal function; follow BP and determine needs of ACE inhibitors; reevaluate mentation and decision to continue namenda and aricept, given severe dementia)  Discharge Diagnoses:  Principal Problem:  *UTI (lower urinary tract infection) Active Problems:  Left hemiplegia  Aphasia  Muscle weakness  Stroke  Acute kidney insufficiency   Discharge Condition: stable and improved. Will be discharge to [facility] for physical rehab and also continuation of care.  Diet recommendation: low carbohydrates; heart healthy diet  Filed Weights   12/06/11 0412 12/06/11 2206 12/07/11 2109  Weight: 99.7 kg (219 lb 12.8 oz) 102.8 kg (226 lb 10.1 oz) 101.288 kg (223 lb 4.8 oz)    History of present illness:  [name] is a 77 y.o. female who presents from the nursing home at request of her family with 1 to 2 day history of worsening AMS. Specifically the patient who is in a nursing home at baseline is not using her R arm the way she normally does, not following commands (per family although she is during my exam), not talking at all.   Hospital Course:  1-Metabolic encephalopathy: secondary to UTI on top of underlying dementia. Patient slowly improving. Will finish antibiotic therapy for UTI and continue PT/OT at SNF. Normal B12 and TSH.   2-E. Coli UTI (lower urinary tract infection): Pan-sensitive. Received 3 days of IV rocephin and will be discharge on levaquin for 7 more days to finish antibiotic therapy. Patient needs to keep herself well hydrated.   3- right hemiplegia: new and appears to be related with encephalopathy; MRI and CT negative for new stroke. Will continue aggrenox.   4-Aphasia: no new stroke; appears to be related to dementia and  encephalopathy 2/2 UTI. Will follow clinical response as infection clears up. Continue secondary prevention for CVA using aggrenox.  5-Hx of Stroke, with new stroke like symptoms: most likely 2/2 to UTI. Stroke work up essentially negative for new abnormalities. Will continue statins, continue controlling BP and DM; will continue aggrenox.   6-Acute kidney failure: secondary to UTI and also mild dehydration with ongoing use of ACE inhibitors. Will continue antibiotics and follow up of renal function in 10 days again. Patient to be kept well hydrated. ACE inhibitors on hold for now until follow up of renal function.  7-DM: continue diet control. A1C 6.3  8-HTN: well controlled. Only ACE inhibitors on hold due to ARF.  9-GERD: 2/2 to hiatal hernia; continue zantac and protonix  10-Vit D deficiency: continue vit D  11-Depression and dementia: continue effexor, continue aricept and namenda. Follow up by PCP as an outpatient for further adjustments.   Procedures:  CT head  MRI brain  Consultations:  PT/OT  Discharge Exam: Filed Vitals:   12/07/11 1915 12/07/11 2109 12/08/11 0621 12/08/11 0838  BP: 142/34 124/63 150/59 136/93  Pulse: 104 96 89 94  Temp:  98.8 F (37.1 C) 98.4 F (36.9 C) 98.1 F (36.7 C)  TempSrc:  Oral Oral   Resp: 20 20 20 18   Height:      Weight:  101.288 kg (223 lb 4.8 oz)    SpO2: 96% 98% 99% 95%    General: AAOX1; no fever; NAD; able to follow commands; more conversant and appropriate Cardiovascular: regular rate, positive SEM, no rubs or gallops Respiratory:  CTA Abdomen: soft, NT, ND, positive BS   Discharge Instructions  Discharge Orders    Future Orders Please Complete By Expires   Diet - low sodium heart healthy      Discharge instructions      Comments:   -Take medication as prescribed -Keep patient well hydrated -Follow up BMET in 10 days to make sure renal function remains stable. -Follow up with PCP in 2 weeks -Physical therapy and  Occupational therapy at the facility       Medication List     As of 12/08/2011 11:39 AM    STOP taking these medications         lisinopril 2.5 MG tablet   Commonly known as: PRINIVIL,ZESTRIL      TAKE these medications         amLODipine 5 MG tablet   Commonly known as: NORVASC   Take 5 mg by mouth daily.      atorvastatin 40 MG tablet   Commonly known as: LIPITOR   Take 40 mg by mouth daily.      cloNIDine 0.1 mg/24hr patch   Commonly known as: CATAPRES - Dosed in mg/24 hr   Place 1 patch onto the skin once a week. APPLY NEW PATCH ON SATURDAY      dipyridamole-aspirin 200-25 MG per 12 hr capsule   Commonly known as: AGGRENOX   Take 1 capsule by mouth 2 (two) times daily.      donepezil 10 MG tablet   Commonly known as: ARICEPT   Take 10 mg by mouth every evening.      fluticasone 50 MCG/ACT nasal spray   Commonly known as: FLONASE   Place 2 sprays into the nose daily.      FOLBEE 2.5-25-1 MG Tabs   Generic drug: Folic Acid-Vit B6-Vit B12   Take 1 tablet by mouth daily.      levofloxacin 500 MG tablet   Commonly known as: LEVAQUIN   Take 1 tablet (500 mg total) by mouth daily.      loperamide 2 MG capsule   Commonly known as: IMODIUM   Take 2 mg by mouth See admin instructions. TAKE 2 CAPS BY MOUTH AFTER FIRST LOOSE STOOL THEN TAKE 1 CAPSULE AFTER EACH LOOSE STOOL. DO NOT EXCEED 10MG /24HRS.      memantine 10 MG tablet   Commonly known as: NAMENDA   Take 10 mg by mouth 2 (two) times daily.      metoprolol succinate 12.5 mg Tb24   Commonly known as: TOPROL-XL   Take 1 tablet (25 mg total) by mouth daily.      multivitamin with minerals Tabs   Take 1 tablet by mouth daily.      pantoprazole 40 MG tablet   Commonly known as: PROTONIX   Take 1 tablet (40 mg total) by mouth daily at 12 noon.      polyethylene glycol powder powder   Commonly known as: GLYCOLAX/MIRALAX   Take 17 g by mouth daily as needed. FOR CONSTIPATION      ranitidine 150 MG tablet     Commonly known as: ZANTAC   Take 150 mg by mouth every evening.      Travoprost (BAK Free) 0.004 % Soln ophthalmic solution   Commonly known as: TRAVATAN   Place 1 drop into both eyes at bedtime.      UTI-Stat Liqd   Take 30 mLs by mouth daily.      venlafaxine XR 75 MG 24 hr  capsule   Commonly known as: EFFEXOR-XR   Take 75 mg by mouth daily.      Vitamin D (Ergocalciferol) 50000 UNITS Caps   Commonly known as: DRISDOL   Take 50,000 Units by mouth every 7 (seven) days. TAKES ON THURSDAY          The results of significant diagnostics from this hospitalization (including imaging, microbiology, ancillary and laboratory) are listed below for reference.    Significant Diagnostic Studies: Ct Head Wo Contrast  12/06/2011  *RADIOLOGY REPORT*  Clinical Data: Altered mental status  CT HEAD WITHOUT CONTRAST  Technique:  Contiguous axial images were obtained from the base of the skull through the vertex without contrast.  Comparison: 03/02/2009  Findings: No evidence of parenchymal hemorrhage or extra-axial fluid collection. No mass lesion, mass effect, or midline shift.  No CT evidence of acute infarction.  Old right frontal and left parietal infarcts with associated encephalomalacic changes.  Subcortical white matter and periventricular small vessel ischemic changes.  Global cortical atrophy.  Stable ventriculomegaly.  Ex vacuo dilatation of the frontal horn of the right lateral ventricle and occipital horn of the left lateral ventricle.  The visualized paranasal sinuses are essentially clear. The mastoid air cells are unopacified.  No evidence of calvarial fracture.  IMPRESSION: No evidence of acute intracranial abnormality.  Old right frontal and left parietal infarcts with associated encephalomalacic changes.  Atrophy with stable ventriculomegaly and small vessel ischemic changes.   Original Report Authenticated By: Charline Bills, M.D.    Mr Brain Wo Contrast  12/06/2011  *RADIOLOGY  REPORT*  Clinical Data:  Increasing in aphasia and generalized weakness.  MRI BRAIN WITHOUT CONTRAST MRA HEAD WITHOUT CONTRAST  Technique: Multiplanar, multiecho pulse sequences of the brain and surrounding structures were obtained according to standard protocol without intravenous contrast.  Angiographic images of the head were obtained using MRA technique without contrast.  Comparison: 12/05/2011 head CT.  08/13/2002 MR brain and MRA angiogram.  MRI HEAD  Findings:  No acute infarct.  Remote moderate to large left occipital - parietal lobe infarct. Remote moderate sized mid right frontal lobe infarct.  Remote small cerebellar infarcts more notable and larger on the right.  Prominent small vessel disease type changes.  Prominent global atrophy.  Ventricular prominence felt to to be related to atrophy rather than hydrocephalus.  No intracranial mass lesion detected on this unenhanced exam.  Mild cervical spondylotic changes C4-5.  IMPRESSION: No acute infarct.  Remote infarcts, small vessel disease type changes and atrophy as discussed above.  MRA HEAD  Findings: Motion degraded exam.  Ectatic vertical cervical segment of the left internal carotid artery.  1.2 mm aneurysm lateral aspect of the right internal carotid artery distal vertical cervical/petrous segment genu. This appears stable from 2004 exam.  Mild to slightly moderate narrowing right internal carotid artery cavernous segment.  Mild narrowing A1 segment anterior cerebral artery bilaterally.  Middle cerebral artery branch vessel moderate to marked narrowing and irregularity.  Distal A2 segment anterior cerebral artery moderate narrowing and irregularity.  Left vertebral artery poorly delineated beyond the foramen magnum.  Nonvisualization PICAs and AICAs.  Mild narrowing proximal mid aspect basilar artery.  Moderate to marked narrowing involving portions of the posterior cerebral artery and superior cerebellar arteries bilaterally.  IMPRESSION: Prominent  intracranial atherosclerotic type changes as noted above.  1.2 mm aneurysm lateral aspect of the right internal carotid artery distal vertical cervical/petrous segment genu.   Stable since 2004.   Original Report Authenticated By: Almedia Balls  Constance Goltz, M.D.    Dg Chest Portable 1 View  12/05/2011  *RADIOLOGY REPORT*  Clinical Data: Altered mental status.  Hypertension and asthma  PORTABLE CHEST - 1 VIEW  Comparison: 07/23/2006  Findings: Heart size is mildly enlarged.  No pleural effusion or edema.  No airspace consolidation.  There is a moderate-sized hiatal hernia identified.  IMPRESSION:  1. Cardiac enlargement. 2.  Hiatal hernia.   Original Report Authenticated By: Rosealee Albee, M.D.    Mr Mra Head/brain Wo Cm  12/06/2011  *RADIOLOGY REPORT*  Clinical Data:  Increasing in aphasia and generalized weakness.  MRI BRAIN WITHOUT CONTRAST MRA HEAD WITHOUT CONTRAST  Technique: Multiplanar, multiecho pulse sequences of the brain and surrounding structures were obtained according to standard protocol without intravenous contrast.  Angiographic images of the head were obtained using MRA technique without contrast.  Comparison: 12/05/2011 head CT.  08/13/2002 MR brain and MRA angiogram.  MRI HEAD  Findings:  No acute infarct.  Remote moderate to large left occipital - parietal lobe infarct. Remote moderate sized mid right frontal lobe infarct.  Remote small cerebellar infarcts more notable and larger on the right.  Prominent small vessel disease type changes.  Prominent global atrophy.  Ventricular prominence felt to to be related to atrophy rather than hydrocephalus.  No intracranial mass lesion detected on this unenhanced exam.  Mild cervical spondylotic changes C4-5.  IMPRESSION: No acute infarct.  Remote infarcts, small vessel disease type changes and atrophy as discussed above.  MRA HEAD  Findings: Motion degraded exam.  Ectatic vertical cervical segment of the left internal carotid artery.  1.2 mm aneurysm  lateral aspect of the right internal carotid artery distal vertical cervical/petrous segment genu. This appears stable from 2004 exam.  Mild to slightly moderate narrowing right internal carotid artery cavernous segment.  Mild narrowing A1 segment anterior cerebral artery bilaterally.  Middle cerebral artery branch vessel moderate to marked narrowing and irregularity.  Distal A2 segment anterior cerebral artery moderate narrowing and irregularity.  Left vertebral artery poorly delineated beyond the foramen magnum.  Nonvisualization PICAs and AICAs.  Mild narrowing proximal mid aspect basilar artery.  Moderate to marked narrowing involving portions of the posterior cerebral artery and superior cerebellar arteries bilaterally.  IMPRESSION: Prominent intracranial atherosclerotic type changes as noted above.  1.2 mm aneurysm lateral aspect of the right internal carotid artery distal vertical cervical/petrous segment genu.   Stable since 2004.   Original Report Authenticated By: Fuller Canada, M.D.     Microbiology: Recent Results (from the past 240 hour(s))  URINE CULTURE     Status: Normal   Collection Time   12/05/11 11:17 PM      Component Value Range Status Comment   Specimen Description URINE, RANDOM   Final    Special Requests OZ:HYQMV ON [id number] @0113    Final    Culture  Setup Time 12/06/2011 11:38   Final    Colony Count 80,000 COLONIES/ML   Final    Culture ESCHERICHIA COLI   Final    Report Status 12/08/2011 FINAL   Final    Organism ID, Bacteria ESCHERICHIA COLI   Final   MRSA PCR SCREENING     Status: Normal   Collection Time   12/06/11  6:30 AM      Component Value Range Status Comment   MRSA by PCR NEGATIVE  NEGATIVE Final      Labs: Basic Metabolic Panel:  Lab 12/08/11 2952 12/06/11 0312 12/05/11 2245  NA 144 -- 144  K 4.3 -- 4.0  CL 109 -- 108  CO2 24 -- 21  GLUCOSE 145* -- 225*  BUN 33* -- 65*  CREATININE 0.94 1.33* 1.30*  CALCIUM 9.1 -- 9.4  MG -- -- --  PHOS -- -- --    Liver Function Tests:  Lab 12/05/11 2245  AST 19  ALT 25  ALKPHOS 77  BILITOT 0.2*  PROT 7.0  ALBUMIN 3.1*   CBC:  Lab 12/06/11 0312 12/05/11 2245  WBC 7.2 6.8  NEUTROABS -- --  HGB 13.7 13.1  HCT 42.0 40.4  MCV 86.6 86.0  PLT 160 173   CBG:  Lab 12/08/11 0625 12/08/11 0057 12/07/11 1630 12/07/11 1158 12/07/11 0735  GLUCAP 149* 128* 172* 146* 160*    Time coordinating discharge: > 30 minutes  Signed:  Chynah Orihuela  [facility] 12/08/2011, 11:39 AM

## 2011-12-09 ENCOUNTER — Non-Acute Institutional Stay: Payer: Self-pay | Admitting: Family Medicine

## 2011-12-09 DIAGNOSIS — N289 Disorder of kidney and ureter, unspecified: Secondary | ICD-10-CM

## 2011-12-09 DIAGNOSIS — N390 Urinary tract infection, site not specified: Secondary | ICD-10-CM

## 2011-12-09 NOTE — Progress Notes (Signed)
  Subjective:    Patient ID: [name], female    DOB: 09/30/1934, 77 y.o.   MRN: [id number]  HPI We were asked to take [name] as a new patient. She had previously been a patient of [name].   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 Review of Systems     Objective:   Physical Exam  Constitutional: She appears well-developed and well-nourished.  Psychiatric:       Blank expression. Responses to questions seem to be only semi-coherent. She seems to block on responses to some questions.   Registers 3 words with reinforcement, recalls 1          Assessment & Plan:

## 2011-12-09 NOTE — Progress Notes (Unsigned)
Patient ID: [name], female   DOB: [birth year], 77 y.o.   MRN: [id number] Family Medicine Teaching Service [facility] Admission History and Physical  Patient name: [name] Medical record number: [id number] Date of birth: May 12, 1934 Age: 77 y.o. Gender: female  Primary Care Provider: Ron Parker, MD  History of Present Illness: [name] is a 77 y.o. year old female with history of alzheimer's dementia, Hx CVA, HTN who presented from previous SNF ([facility]) to [facility] on 9/21 for altered mental status and decreased responsiveness. Found to have a pan-sensitive E coli UTI and mild acute kidney injury with creatinine elevated to 1.3. There was also question of decreased right arm use, however brain MRI and head CT were negative for any acute stroke. Patient was treated with Levaquin to be continued for 7 days, renal clearance had improved with a creatinine of 0.9 for a discharge. She is again transferred to skilled nursing facility due to severe dementia and mobility/gait deficits.    Patient Active Problem List  Diagnosis  . Left hemiplegia  . Aphasia  . Muscle weakness  . Malaise  . Fatigue  . Breast neoplasm  . Glaucoma  . UTI (lower urinary tract infection)  . Vitamin d deficiency  . Hiatal hernia  . Constipation  . Diarrhea  . Obesity  . Hyperlipidemia  . Alzheimer's dementia  . Depression  . Allergic rhinitis  . Dementia  . Stroke  . Acute kidney insufficiency   Past Medical History: Past Medical History  Diagnosis Date  . Left hemiplegia   . Aphasia   . Muscle weakness   . Diabetes mellitus   . Malaise   . Fatigue   . Breast neoplasm   . Hypertension   . Glaucoma   . UTI (lower urinary tract infection)   . Vitamin d deficiency   . Hiatal hernia   . Constipation   . Diarrhea   . Asthma   . Obesity   . Hyperlipidemia   . Alzheimer's dementia   . Depression   . Allergic rhinitis   . Dementia   . Stroke     infarct      Past Surgical History: No past surgical history on file.  Social History: History   Social History  . Marital Status: Widowed    Spouse Name: N/A    Number of Children: N/A  . Years of Education: N/A   Social History Main Topics  . Smoking status: Never Smoker   . Smokeless tobacco: Not on file  . Alcohol Use: No  . Drug Use: No  . Sexually Active:    Other Topics Concern  . Not on file   Social History Narrative  . No narrative on file    Family History: No family history on file.  Allergies: Allergies  Allergen Reactions  . Penicillins Other (See Comments)    Per MAR  . Tetanus Toxoids Other (See Comments)    Per MAR  . Vicodin (Hydrocodone-Acetaminophen) Other (See Comments)    Per [facility]    Current Outpatient Prescriptions  Medication Sig Dispense Refill  . amLODipine (NORVASC) 5 MG tablet Take 5 mg by mouth daily.      Marland Kitchen atorvastatin (LIPITOR) 40 MG tablet Take 40 mg by mouth daily.      . cloNIDine (CATAPRES - DOSED IN MG/24 HR) 0.1 mg/24hr patch Place 1 patch onto the skin once a week. APPLY NEW PATCH ON SATURDAY      . Cranberry-Vitamin  C-Inulin (UTI-STAT) LIQD Take 30 mLs by mouth daily.      Marland Kitchen dipyridamole-aspirin (AGGRENOX) 200-25 MG per 12 hr capsule Take 1 capsule by mouth 2 (two) times daily.      Marland Kitchen donepezil (ARICEPT) 10 MG tablet Take 10 mg by mouth every evening.      . fluticasone (FLONASE) 50 MCG/ACT nasal spray Place 2 sprays into the nose daily.      . Folic Acid-Vit B6-Vit B12 (FOLBEE) 2.5-25-1 MG TABS Take 1 tablet by mouth daily.      Marland Kitchen levofloxacin (LEVAQUIN) 500 MG tablet Take 1 tablet (500 mg total) by mouth daily.      Marland Kitchen loperamide (IMODIUM) 2 MG capsule Take 2 mg by mouth See admin instructions. TAKE 2 CAPS BY MOUTH AFTER FIRST LOOSE STOOL THEN TAKE 1 CAPSULE AFTER EACH LOOSE STOOL. DO NOT EXCEED 10MG /24HRS.      Marland Kitchen memantine (NAMENDA) 10 MG tablet Take 10 mg by mouth 2 (two) times daily.      . metoprolol succinate (TOPROL-XL) 12.5  mg TB24 Take 1 tablet (25 mg total) by mouth daily.      . Multiple Vitamin (MULTIVITAMIN WITH MINERALS) TABS Take 1 tablet by mouth daily.      . pantoprazole (PROTONIX) 40 MG tablet Take 1 tablet (40 mg total) by mouth daily at 12 noon.      . polyethylene glycol powder (GLYCOLAX/MIRALAX) powder Take 17 g by mouth daily as needed. FOR CONSTIPATION      . ranitidine (ZANTAC) 150 MG tablet Take 150 mg by mouth every evening.      . Travoprost, BAK Free, (TRAVATAN) 0.004 % SOLN ophthalmic solution Place 1 drop into both eyes at bedtime.      Marland Kitchen venlafaxine XR (EFFEXOR-XR) 75 MG 24 hr capsule Take 75 mg by mouth daily.      . Vitamin D, Ergocalciferol, (DRISDOL) 50000 UNITS CAPS Take 50,000 Units by mouth every 7 (seven) days. TAKES ON THURSDAY        Review Of Systems: Per HPI with the following additions: Patient denies any pain, difficulty swallowing.  Otherwise 12 point review of systems was performed and was unremarkable.  Physical Exam: Pulse: 72  Blood Pressure: 130/72 RR: 12   Temp: 98.2  General: alert, cooperative, appears stated age, no distress, slowed mentation and Appears confused HEENT: extra ocular movement intact and oropharynx clear, no lesions Heart: 3/6 SEM is heard at upper left sternal border, regular rate and rhythm Lungs: clear to auscultation, no wheezes or rales and unlabored breathing Abdomen: abdomen is soft without significant tenderness, masses, organomegaly or guarding Extremities: extremities normal, atraumatic, no cyanosis or edema. Right shoulder ROM diminished, cannot actively move above 90 degrees. Skin:no rashes Neurology: sensation grossly normal and Some rigidity, 4/5 right grip strength compared with 5 out of 5 left. Resist standing. Speech is slowed/quiet and she answers yes or no questions.  Labs and Imaging: Lab Results  Component Value Date/Time   NA 144 12/08/2011  6:50 AM   K 4.3 12/08/2011  6:50 AM   CL 109 12/08/2011  6:50 AM   CO2 24 12/08/2011   6:50 AM   BUN 33* 12/08/2011  6:50 AM   CREATININE 0.94 12/08/2011  6:50 AM   GLUCOSE 145* 12/08/2011  6:50 AM   Lab Results  Component Value Date   WBC 7.2 12/06/2011   HGB 13.7 12/06/2011   HCT 42.0 12/06/2011   MCV 86.6 12/06/2011   PLT 160 12/06/2011  Vitamin B12 1328 TSH 1.3 Albumin 3.1 Protein 7.0  Lab Results  Component Value Date   HGBA1C 6.3* 12/06/2011   Lab Results  Component Value Date   CHOL 161 12/06/2011   HDL 35* 12/06/2011   LDLCALC 96 12/06/2011   TRIG 151* 12/06/2011   CHOLHDL 4.6 12/06/2011     Ct Head Wo Contrast  12/06/2011 *RADIOLOGY REPORT* Clinical Data: Altered mental status CT HEAD WITHOUT CONTRAST Technique: IMPRESSION: No evidence of acute intracranial abnormality. Old right frontal and left parietal infarcts with associated encephalomalacic changes. Atrophy with stable ventriculomegaly and small vessel ischemic changes. Original Report Authenticated By: Charline Bills, M.D.   Mr Brain Wo Contrast  12/06/2011 *RADIOLOGY REPORT* Clinical Data: Increasing in aphasia and generalized weakness. MRI BRAIN WITHOUT CONTRAST MRA HEAD WITHOUT CONTRAST Technique: Multiplanar, multiecho pulse sequences of the brain and surrounding structures were obtained according to standard protocol without intravenous contrast. Angiographic images of the head were obtained using MRA technique without contrast. Comparison: 12/05/2011 head CT. 08/13/2002 MR brain and MRA angiogram. MRI HEAD Findings: No acute infarct. Remote moderate to large left occipital - parietal lobe infarct. Remote moderate sized mid right frontal lobe infarct. Remote small cerebellar infarcts more notable and larger on the right. Prominent small vessel disease type changes. Prominent global atrophy. Ventricular prominence felt to to be related to atrophy rather than hydrocephalus. No intracranial mass lesion detected on this unenhanced exam. Mild cervical spondylotic changes C4-5. IMPRESSION: No acute infarct. Remote  infarcts, small vessel disease type changes and atrophy as discussed above. MRA HEAD Findings: Motion degraded exam. Ectatic vertical cervical segment of the left internal carotid artery. 1.2 mm aneurysm lateral aspect of the right internal carotid artery distal vertical cervical/petrous segment genu. This appears stable from 2004 exam. Mild to slightly moderate narrowing right internal carotid artery cavernous segment. Mild narrowing A1 segment anterior cerebral artery bilaterally. Middle cerebral artery branch vessel moderate to marked narrowing and irregularity. Distal A2 segment anterior cerebral artery moderate narrowing and irregularity. Left vertebral artery poorly delineated beyond the foramen magnum. Nonvisualization PICAs and AICAs. Mild narrowing proximal mid aspect basilar artery. Moderate to marked narrowing involving portions of the posterior cerebral artery and superior cerebellar arteries bilaterally. IMPRESSION: Prominent intracranial atherosclerotic type changes as noted above. 1.2 mm aneurysm lateral aspect of the right internal carotid artery distal vertical cervical/petrous segment genu. Stable since 2004. Original Report Authenticated By: Fuller Canada, M.D.   Dg Chest Portable 1 View  12/05/2011 *RADIOLOGY REPORT* Clinical Data: Altered mental status. Hypertension and asthma PORTABLE CHEST - 1 VIEW Comparison: 07/23/2006 Findings: Heart size is mildly enlarged. No pleural effusion or edema. No airspace consolidation. There is a moderate-sized hiatal hernia identified. IMPRESSION: 1. Cardiac enlargement. 2. Hiatal hernia. Original Report Authenticated By: Rosealee Albee, M.D.   Assessment and Plan: [name] is a 77 y.o. year old female with history of dementia, CVA with aphasia, hypertension presenting with metabolic encephalopathy and acute kidney injury now resolved after a UTI.  1. UTI. To complete Levaquin therapy for total of 7 days. Afebrile and asymptomatic.  2. altered  mental status/delirium. Per discharge summary this is improved to baseline. Patient may be having some delirium with new place currently given her waxing and waning responsiveness to questions, but may also be more aphasia related. Will follow clinically.  3. dementia. Her history deemed Alzheimer's. Continue Aricept and Namenda. F/u MMSE in 3-4 weeks.  4. hypertension. At goal currently on clonidine, metoprolol, Norvasc. Follow creatinine, may restart  low-dose ACE inhibitor in the future.  5. acute kidney injury. Creatinine seems to have resolved after UTI treatment. Holding ACEi for now. Followup creatinine in one to 2 weeks.  6. hyperlipidemia. Lipid panel as inpatient is acceptable with LDL 96. Continue statin.  7. history of stroke. Continue Aggrenox secondary therapy and risk factor management.  8. right shoulder range of motion. Will start physical therapy evaluation. Work on Set designer, no sign of acute stroke. Consider cervical plain film to evaluate sign of peripheral nerve impingement.  9. Vitamin D deficiency. Continue replacement and recheck in 2 months.  10. Depression. Continue effexor at previous dose.   Lloyd Huger, MD [name] Resident - PGY-3 12/09/2011 1:39 PM

## 2011-12-11 ENCOUNTER — Encounter: Payer: Self-pay | Admitting: Family Medicine

## 2011-12-11 DIAGNOSIS — N182 Chronic kidney disease, stage 2 (mild): Secondary | ICD-10-CM | POA: Insufficient documentation

## 2011-12-11 DIAGNOSIS — I350 Nonrheumatic aortic (valve) stenosis: Secondary | ICD-10-CM | POA: Insufficient documentation

## 2011-12-11 NOTE — Assessment & Plan Note (Signed)
Resolved during hospitalization with final creatinine of 0.9

## 2011-12-11 NOTE — Assessment & Plan Note (Signed)
Being treated with Levaquin

## 2011-12-26 ENCOUNTER — Encounter: Payer: Self-pay | Admitting: Family Medicine

## 2011-12-26 ENCOUNTER — Non-Acute Institutional Stay: Payer: Self-pay | Admitting: Family Medicine

## 2011-12-26 DIAGNOSIS — Z9189 Other specified personal risk factors, not elsewhere classified: Secondary | ICD-10-CM

## 2011-12-26 DIAGNOSIS — I10 Essential (primary) hypertension: Secondary | ICD-10-CM

## 2011-12-26 DIAGNOSIS — R4701 Aphasia: Secondary | ICD-10-CM

## 2011-12-26 DIAGNOSIS — E559 Vitamin D deficiency, unspecified: Secondary | ICD-10-CM

## 2011-12-26 DIAGNOSIS — F0280 Dementia in other diseases classified elsewhere without behavioral disturbance: Secondary | ICD-10-CM

## 2011-12-26 DIAGNOSIS — D493 Neoplasm of unspecified behavior of breast: Secondary | ICD-10-CM

## 2011-12-26 DIAGNOSIS — I350 Nonrheumatic aortic (valve) stenosis: Secondary | ICD-10-CM

## 2011-12-26 DIAGNOSIS — N182 Chronic kidney disease, stage 2 (mild): Secondary | ICD-10-CM

## 2011-12-26 NOTE — Assessment & Plan Note (Signed)
Persistent 3/6 SEM. Trace edema but no current signs of fluid overload, will follow.

## 2011-12-26 NOTE — Assessment & Plan Note (Signed)
Unable to locate history in chart. Mammogram has been ordered already. Will follow up.

## 2011-12-26 NOTE — Assessment & Plan Note (Signed)
Likely contributing to patient not participating in MMSE

## 2011-12-26 NOTE — Progress Notes (Signed)
Subjective:  Nursing home visit on 12/25/2011  At beginning of visit, patient blankly stared at me for approximately 2 minutes while I talked to her then started to have a conversation with me though mostly with short sentences or yes or no. Patient states she is doing fine and nothing is bothering her at beginning of visit.   1. Dementia-attempted to perform MMSE. Patient stated she did not want to participate and that I was bothering her. Went though entire MMSE and patient did not participate. After I finished MMSE, I asked patient if anything was bothering her and she stated yes, patient stated the questions were bothering her. I discussed with nursing staff who stated that blank stares and short sentences/responses are the patient's baseline since she arrived in the nursing home.   2. Hypertension-well controlled. Patient without complaints such as chest pain or SOB.   3. Recent hospitalization for UTI/AMS-patient without complaints of dysuria or polyuria. Nursing staff states patient at her baseline mental status. Patient needs to often be reprompted to follow questions.   # Stage 2 sacral wound-patient seen by wound care recently. Bunny boots for heels. Wedge to help with positioning. RN to provide protein supplements.   # Patient participating in PT/OT per records for right shoulder limited ROM  #Health maintenance--flu shot given 12/08/11 Mammogram ordered previously. Do not see results.  Anemia panel ordered previously-results not available.  Consider Dtap. Given age will likely defer colonoscopy unless iron deficiency anemia, uncertain if can give zostavax in nursing home.   ROS--See HPI  Past Medical History-smoking status noted: never smoker.  Reviewed problem list.  Medications- reviewed and updated Chief complaint-noted  Objective: BP 125/72  Pulse 97  Temp 98.6 F (37 C)  Resp 20  Wt 214 lb 12.8 oz (97.433 kg) Gen: NAD, alert and cooperative, often blankly stares  during conversation, requires frequent reprompting. Short sentence use only.  HEENT: EOMI, MMM, oropharynx clear without lesions CV: RRR, unchanged 4/6 SEM at LUSB Abdomen: soft/nontender/nondistended Lungs: CTAB, no wheezes rales or rhonchi Neuro: patient did not participate in standing. States can feel fine touch throughotu.  4/5 grip strength in left hand compared to 5/5 on right. Patient appears slightly weaker throughout left side but unable to break her movements on left.  Skin:  Stage II ulcer on sacrum without surrounding erythema/warmth Ext: moves all extremities, trace edema bilateral legs.   Assessment/Plan: See problem oriented charted  Stage II ulcer-continue frequent turns and wound care per nursing staff.  COntinue ROM exercises with PT, did not reevaluate shoulder today.  Has prns ordered for constipation or diarrhea, will resolve constipation and depression from problem list unless more persistent problem in either direction.    Additionally, will plan to contact family within coming week.

## 2011-12-26 NOTE — Assessment & Plan Note (Signed)
patietn did not participate in MMSE. Could be related to aphasia partially. Will continue aricept and namenda.

## 2011-12-26 NOTE — Assessment & Plan Note (Signed)
patient with AKI with recent hospitalization. Will check BMET at this time, may be able to restart ace inhibitor for DM for renal protection if Cr stable less than 1 as was noted on September 24th.

## 2011-12-27 DIAGNOSIS — Z9189 Other specified personal risk factors, not elsewhere classified: Secondary | ICD-10-CM | POA: Insufficient documentation

## 2011-12-27 DIAGNOSIS — I10 Essential (primary) hypertension: Secondary | ICD-10-CM | POA: Insufficient documentation

## 2011-12-27 NOTE — Assessment & Plan Note (Signed)
a1c 6.3 during hospitalization. Will follow q6 months and treat with oral medications if develops DM.

## 2011-12-27 NOTE — Assessment & Plan Note (Signed)
Check BMET, if Cr not elevated, could add on ace inhibitor. May need to stop one of other medication such as norvasc as lisinopril more important for renal protection given CKD II and patient at risk for DM.

## 2011-12-27 NOTE — Assessment & Plan Note (Signed)
Will plan on rechecking followign completeion of supplementation. Will check at my next visit to nursing home.

## 2011-12-28 ENCOUNTER — Telehealth: Payer: Self-pay | Admitting: Family Medicine

## 2011-12-28 NOTE — Telephone Encounter (Signed)
Called regarding lab value potassium 6.7 this evening. This is odd because 2 other residents also have this same critical value tonight on routine lab draws. Lab is verifying the results. Patient is asymptomatic. Most recent renal function normal, no recent med changes noted. Not taking potassium. i advised to check the BMET again tomorrow.

## 2011-12-29 LAB — BASIC METABOLIC PANEL: Glucose: 156 mg/dL

## 2012-01-10 ENCOUNTER — Telehealth: Payer: Self-pay | Admitting: Family Medicine

## 2012-01-10 ENCOUNTER — Encounter: Payer: Self-pay | Admitting: Family Medicine

## 2012-01-10 LAB — IRON AND TIBC
%SAT: 17 % (ref 20–55)
Ferritin: 60 ng/mL (ref 9.0–150.0)
Vitamin B12 Deficiency Panel: 1353 pg/mL

## 2012-01-10 NOTE — Telephone Encounter (Signed)
Sister, Colvin Caroli returned call.   She had been visiting Ms. Budhu at the hospital. Noted she was eating well and seemed to be in good spirits. Ms. Christman stated in short sentences that she was feeling well.   Sister has noted that since mother had her most recent hospitalization associated with UTI that her baseline interaction has changed to staring the majority of the time with head shaking yes, no, or only saying a few words and mainly 2 word sentences. She is concerned that her dementia is worsening rather quickly since that last hospitalization.   Sister is working on obtaining POA. She wishes for her sister to still be full code and she does not recall every having conversations with [name] about her wishes. I told the sister I would contact the nursing home team to see if they had resources such as social work to assist with POA paperwork. Otherwise, we may be able to use [facility]. She is looking into finding an attorney to help her.

## 2012-01-10 NOTE — Telephone Encounter (Signed)
Called and left message with Emergency Contact Colvin Caroli basically to introduce myself by phone and see how she thinks her sister is doing. Encouraged patient contact to call our clinic if she would like to speak to me.

## 2012-01-13 ENCOUNTER — Encounter: Payer: Self-pay | Admitting: Family Medicine

## 2012-01-13 LAB — BASIC METABOLIC PANEL
BUN, Bld: 30
BUN: 31 mg/dL — AB (ref 4–21)
CO2: 19 mmol/L
Chloride: 113 mmol/L — ABNORMAL HIGH
Creat: 0.88
Creat: 0.86
Glucose: 150

## 2012-01-13 NOTE — Progress Notes (Signed)
Entered labs today from nursing home under abstract. Unfortunately, dates did not record and appears labwork was done today. These labs were done on 10/14 and 10/15 with normal potassium level being recorded on 10/15.

## 2012-01-14 ENCOUNTER — Other Ambulatory Visit: Payer: Self-pay | Admitting: Family Medicine

## 2012-01-14 DIAGNOSIS — Z853 Personal history of malignant neoplasm of breast: Secondary | ICD-10-CM

## 2012-01-14 DIAGNOSIS — Z9889 Other specified postprocedural states: Secondary | ICD-10-CM

## 2012-01-18 ENCOUNTER — Encounter: Payer: Self-pay | Admitting: Family Medicine

## 2012-01-18 LAB — CBC AND DIFFERENTIAL
HCT: 41 % (ref 36–46)
Neutrophils Absolute: 3 /uL
WBC: 6.1 10^3/mL

## 2012-01-18 LAB — HEPATIC FUNCTION PANEL
ALT: 22 U/L (ref 7–35)
Bilirubin, Total: 0.2 mg/dL

## 2012-01-18 LAB — BASIC METABOLIC PANEL
BUN: 31 mg/dL — AB (ref 4–21)
Creatinine: 1 mg/dL (ref 0.5–1.1)

## 2012-01-21 ENCOUNTER — Non-Acute Institutional Stay: Payer: Self-pay | Admitting: Family Medicine

## 2012-01-21 MED ORDER — LEVOFLOXACIN 250 MG PO TABS: 250.0000 mg | ORAL_TABLET | Freq: Every day | ORAL | Status: AC

## 2012-01-21 NOTE — Progress Notes (Signed)
Called on 11/4 by Rebeca Allegra nurse, with concern that patient's temperature was 100.1 and 100.3.  Patient has h/o UTI.  Martie Lee wrote orders to check labs, UA, and CXR (because had new diet started recently with thinner liquids/foods).  CBC showed no leukocytosis with WBC of 6.1, CMET and CBC are unremarkable with exception of low albumin of 3.2.  No other vital sign abnormalities since that time.  Sabrina prophylactically started pt on Levaquin.  No UA or CXR results have been seen on this patient.

## 2012-01-25 ENCOUNTER — Other Ambulatory Visit: Payer: Self-pay | Admitting: Family Medicine

## 2012-01-25 ENCOUNTER — Encounter: Payer: Self-pay | Admitting: Family Medicine

## 2012-01-25 ENCOUNTER — Ambulatory Visit
Admission: RE | Admit: 2012-01-25 | Discharge: 2012-01-25 | Disposition: A | Discharge status: Home / Self Care | Payer: Medicare Other | Source: Ambulatory Visit | Attending: Family Medicine | Admitting: Family Medicine

## 2012-01-25 DIAGNOSIS — Z9889 Other specified postprocedural states: Secondary | ICD-10-CM

## 2012-01-25 DIAGNOSIS — Z853 Personal history of malignant neoplasm of breast: Secondary | ICD-10-CM

## 2012-01-25 LAB — URINE CULTURE
BUN: 34 mg/dL — AB (ref 4–21)
Chloride: 111 mmol/L
Creat: 0.88
Urine Culture: NO GROWTH

## 2012-01-25 LAB — ALBUMIN: Albumin: 3.2 — AB (ref 3.5–5.2)

## 2012-01-28 ENCOUNTER — Other Ambulatory Visit: Payer: Self-pay | Admitting: Family Medicine

## 2012-01-28 ENCOUNTER — Ambulatory Visit
Admission: RE | Admit: 2012-01-28 | Discharge: 2012-01-28 | Disposition: A | Discharge status: Home / Self Care | Payer: PRIVATE HEALTH INSURANCE | Source: Ambulatory Visit | Attending: Family Medicine | Admitting: Family Medicine

## 2012-01-28 DIAGNOSIS — Z853 Personal history of malignant neoplasm of breast: Secondary | ICD-10-CM

## 2012-01-28 DIAGNOSIS — Z9889 Other specified postprocedural states: Secondary | ICD-10-CM

## 2012-02-08 ENCOUNTER — Encounter: Payer: Self-pay | Admitting: Pharmacist

## 2012-02-08 DIAGNOSIS — I10 Essential (primary) hypertension: Secondary | ICD-10-CM

## 2012-02-10 ENCOUNTER — Non-Acute Institutional Stay: Payer: Self-pay | Admitting: Family Medicine

## 2012-02-10 DIAGNOSIS — I10 Essential (primary) hypertension: Secondary | ICD-10-CM

## 2012-02-10 DIAGNOSIS — E559 Vitamin D deficiency, unspecified: Secondary | ICD-10-CM

## 2012-02-10 DIAGNOSIS — E669 Obesity, unspecified: Secondary | ICD-10-CM

## 2012-02-10 DIAGNOSIS — F0280 Dementia in other diseases classified elsewhere without behavioral disturbance: Secondary | ICD-10-CM

## 2012-02-14 MED ORDER — AMLODIPINE BESYLATE 5 MG PO TABS: 5.0000 mg | ORAL_TABLET | Freq: Every day | ORAL | Status: DC

## 2012-02-14 NOTE — Progress Notes (Signed)
  Subjective:    Patient ID: [name], female    DOB: 1934-07-22, 77 y.o.   MRN: [id number]  HPI No problems per staff. She doesn't seem able to verbalize complaints.    Review of Systems     Objective:   Physical Exam  Constitutional:       Generalized obesity   Eyes: EOM are normal. Pupils are equal, round, and reactive to light.  Cardiovascular: Normal rate and regular rhythm.   Murmur heard. Pulmonary/Chest: Effort normal and breath sounds normal. She has no wheezes. She has no rales.  Abdominal: Soft. She exhibits no distension. There is no tenderness. There is no rebound and no guarding.  Musculoskeletal: She exhibits no edema.          Assessment & Plan:

## 2012-02-14 NOTE — Assessment & Plan Note (Signed)
Gaining weight without edema. Low activity level

## 2012-02-14 NOTE — Assessment & Plan Note (Signed)
Difficult to assess due to aphasia

## 2012-02-14 NOTE — Assessment & Plan Note (Signed)
On replacement

## 2012-02-14 NOTE — Assessment & Plan Note (Signed)
Inadequate control so Amlodipine increased to 5 mg

## 2012-02-15 ENCOUNTER — Encounter: Payer: Self-pay | Admitting: Family Medicine

## 2012-02-15 DIAGNOSIS — L89154 Pressure ulcer of sacral region, stage 4: Secondary | ICD-10-CM | POA: Insufficient documentation

## 2012-02-15 DIAGNOSIS — I050 Rheumatic mitral stenosis: Secondary | ICD-10-CM | POA: Insufficient documentation

## 2012-02-15 HISTORY — DX: Pressure ulcer of sacral region, stage 4: L89.154

## 2012-02-16 ENCOUNTER — Other Ambulatory Visit: Payer: Self-pay | Admitting: Family Medicine

## 2012-02-16 ENCOUNTER — Encounter: Payer: Self-pay | Admitting: Family Medicine

## 2012-02-16 ENCOUNTER — Non-Acute Institutional Stay: Payer: Self-pay | Admitting: Family Medicine

## 2012-02-16 DIAGNOSIS — I350 Nonrheumatic aortic (valve) stenosis: Secondary | ICD-10-CM

## 2012-02-16 DIAGNOSIS — F0150 Vascular dementia without behavioral disturbance: Secondary | ICD-10-CM

## 2012-02-16 DIAGNOSIS — I10 Essential (primary) hypertension: Secondary | ICD-10-CM

## 2012-02-16 DIAGNOSIS — E559 Vitamin D deficiency, unspecified: Secondary | ICD-10-CM

## 2012-02-16 DIAGNOSIS — D493 Neoplasm of unspecified behavior of breast: Secondary | ICD-10-CM

## 2012-02-16 DIAGNOSIS — Z9189 Other specified personal risk factors, not elsewhere classified: Secondary | ICD-10-CM

## 2012-02-16 NOTE — Assessment & Plan Note (Signed)
Will ask nursing home team to start 50,000 units Vitamin D monthly. Overall level still low but is repleted.

## 2012-02-16 NOTE — Assessment & Plan Note (Signed)
Well controlled with addition of 4th agent with vast majority SBP <140. Lisinopril also with renal protective effects.

## 2012-02-16 NOTE — Assessment & Plan Note (Signed)
SEM persists and edema trace persists. Not symptomatic and no overt signs of fluid overload. Will follow.

## 2012-02-16 NOTE — Progress Notes (Signed)
Subjective:  Nursing home visit   Patient's care is being coordinated currently with [facility] Hal Neer, NP. Patient was seen on 11/4.   1. Hypertension-due to CKD stage II, patient had been started on an Ace inhibitor and amloidipine titrated down. Amlodipine subsequently increased back to 5 mg. Review of records show since increased amlodipine, most sbp <140. Most recent 128/73 noted. Patient without complaints such as chest pain or SOB.   2. Wound care/ Stage 2 sacral wound-Apparently wound had healed at some point per records and has now reoccurred. Patient currently having topical dressing under non adhesive bandage applied.   Wound care seeing-Bunny boots for heels. Wedge to help with positioning.  protein supplements.   3. Vitamin D, completed supplementation with increase to 27 ng/mL.   4. GERD-Sabrina Clelia Croft has taken off PPI and continued H2 blocker.   #Health maintenance--flu shot given 12/08/11 Mammogram-Birads 2 with follow up in 1 year (full results dated 11/14)   ROS--See HPI  Past Medical History-smoking status noted: never smoker.  Reviewed problem list.  Medications- reviewed and updated Chief complaint-noted  Objective: BP 128/73  Pulse 80  Temp 98.5 F (36.9 C)  Resp 18  Wt 217 lb 12.8 oz (98.793 kg) Gen: NAD, alert, speaks when spoken to more frequently than last visit. Some sentences contain 3 words when previously primarily only 1-2 words. Patient does not fall asleep during exam.  HEENT: EOMI, MMM, oropharynx clear without lesions CV: RRR, unchanged 4/6 SEM at LUSB Abdomen: soft/nontender/nondistended Lungs: CTAB, no wheezes rales or rhonchi  Skin:  Currently with nonadhesive dressing and topical over previously noted stage II ulcer on sacrum.  Ext: trace edema bilateral legs.   Assessment/Plan: See problem oriented charted  Stage II ulcer-continue frequent turns and wound care per nursing staff.

## 2012-02-16 NOTE — Assessment & Plan Note (Signed)
Consider repeat a1c in 2 months.

## 2012-02-16 NOTE — Assessment & Plan Note (Signed)
Patient with mammogram recently which was Birads 2/benign findings with planned follow up in 1 year. Ultrasound showed- 10 cm postsurgical  seroma at the lumpectomy bed at the right breast 12 o'clock correlating to the mammographic finding

## 2012-02-16 NOTE — Assessment & Plan Note (Signed)
Changed problem from alzheimers to vascular dementia although could have baselien alzheimer's. Will continue aricept and namenda.

## 2012-02-18 ENCOUNTER — Non-Acute Institutional Stay: Payer: Self-pay | Admitting: Family Medicine

## 2012-02-19 ENCOUNTER — Encounter: Payer: Self-pay | Admitting: Pharmacist

## 2012-03-16 DIAGNOSIS — I82409 Acute embolism and thrombosis of unspecified deep veins of unspecified lower extremity: Secondary | ICD-10-CM

## 2012-03-16 DIAGNOSIS — J189 Pneumonia, unspecified organism: Secondary | ICD-10-CM

## 2012-03-16 HISTORY — DX: Pneumonia, unspecified organism: J18.9

## 2012-03-16 HISTORY — DX: Acute embolism and thrombosis of unspecified deep veins of unspecified lower extremity: I82.409

## 2012-03-17 ENCOUNTER — Encounter: Payer: Self-pay | Admitting: Pharmacist

## 2012-03-17 DIAGNOSIS — I10 Essential (primary) hypertension: Secondary | ICD-10-CM

## 2012-03-29 ENCOUNTER — Encounter: Payer: Self-pay | Admitting: Family Medicine

## 2012-03-30 ENCOUNTER — Non-Acute Institutional Stay: Payer: Self-pay | Admitting: Family Medicine

## 2012-03-30 DIAGNOSIS — Z9189 Other specified personal risk factors, not elsewhere classified: Secondary | ICD-10-CM

## 2012-03-30 DIAGNOSIS — I10 Essential (primary) hypertension: Secondary | ICD-10-CM

## 2012-03-30 LAB — HEMOGLOBIN A1C: Hgb A1c MFr Bld: 6.4 % — AB (ref 4.0–6.0)

## 2012-03-30 NOTE — Assessment & Plan Note (Signed)
a1c at next blood draw.  Diet controlled.

## 2012-03-30 NOTE — Assessment & Plan Note (Signed)
BP and pulse at goal.  Continue current medications.

## 2012-03-30 NOTE — Progress Notes (Signed)
  Subjective:    Patient ID: [name], female    DOB: 06-May-1934, 78 y.o.   MRN: [id number]  HPI 78 yo seen for Nursing Home visit.  History of CVA with hemiplegia, vascular dementia.  Nursing has no concerns.    Patient does not verbalize concerns.s  Review of Systems see HPIs    Objective:   Physical Exam GEN: No acute distress, laying in bed.  Does not ambuilate CV:  Regular Rate & Rhythm, systolic murmur Respiratory:  Normal work of breathing, CTAB Abd:  + BS, soft, no tenderness to palpation Ext: no pre-tibial edema Psych:  Alert, answers yes or no appropriately.  occaisional other words.  Aphasia.        Assessment & Plan:

## 2012-04-02 ENCOUNTER — Non-Acute Institutional Stay: Payer: Self-pay | Admitting: Family Medicine

## 2012-04-02 MED ORDER — ACETAMINOPHEN ER 650 MG PO TBCR: EXTENDED_RELEASE_TABLET | ORAL | Status: DC

## 2012-04-02 MED ORDER — RIVAROXABAN 15 MG PO TABS: 15.0000 mg | ORAL_TABLET | Freq: Two times a day (BID) | ORAL | Status: DC

## 2012-04-02 NOTE — Progress Notes (Signed)
[facility] calling upper level code pager due to Duplex positive for DVT.  Per nursing/nurse tech patient appears her normal self and does not appear to be in significant distress. However, she has decreased orientation at baseline.  -Start Xarelto 15 mg bid x 3 weeks, then 15 mg qd -Schedule Tylenol 650 mg every 12 hrs; also 650 mg q 12 hours prn pain/fever

## 2012-04-04 ENCOUNTER — Other Ambulatory Visit: Payer: Self-pay

## 2012-04-04 DIAGNOSIS — N6009 Solitary cyst of unspecified breast: Secondary | ICD-10-CM

## 2012-04-07 ENCOUNTER — Encounter: Payer: Self-pay | Admitting: Family Medicine

## 2012-04-13 ENCOUNTER — Other Ambulatory Visit: Payer: Self-pay | Admitting: Family Medicine

## 2012-04-13 MED ORDER — CALCIUM CITRATE 950 (200 CA) MG PO TABS: 1.0000 | ORAL_TABLET | Freq: Every day | ORAL | Status: DC

## 2012-04-13 MED ORDER — VITAMIN D (ERGOCALCIFEROL) 1.25 MG (50000 UNIT) PO CAPS: ORAL_CAPSULE | ORAL | Status: DC

## 2012-04-13 NOTE — Telephone Encounter (Signed)
Patient's repeat Vitamin D level is 24. Plan: increase Vit D and add calcium citrate per orders.

## 2012-04-14 ENCOUNTER — Other Ambulatory Visit: Payer: Self-pay | Admitting: Family Medicine

## 2012-04-14 MED ORDER — CALCIUM CITRATE + PO TABS: ORAL_TABLET | ORAL | Status: DC

## 2012-04-17 ENCOUNTER — Encounter: Payer: Self-pay | Admitting: Family Medicine

## 2012-04-17 DIAGNOSIS — E559 Vitamin D deficiency, unspecified: Secondary | ICD-10-CM

## 2012-04-18 ENCOUNTER — Ambulatory Visit
Admission: RE | Admit: 2012-04-18 | Discharge: 2012-04-18 | Disposition: A | Discharge status: Home / Self Care | Payer: PRIVATE HEALTH INSURANCE | Source: Ambulatory Visit | Attending: Family Medicine | Admitting: Family Medicine

## 2012-04-18 ENCOUNTER — Telehealth: Payer: Self-pay | Admitting: Family Medicine

## 2012-04-18 ENCOUNTER — Ambulatory Visit
Admission: RE | Admit: 2012-04-18 | Discharge: 2012-04-18 | Disposition: A | Discharge status: Home / Self Care | Payer: No Typology Code available for payment source | Source: Ambulatory Visit | Attending: Family Medicine | Admitting: Family Medicine

## 2012-04-18 DIAGNOSIS — N6009 Solitary cyst of unspecified breast: Secondary | ICD-10-CM

## 2012-04-18 NOTE — Telephone Encounter (Signed)
Form [id number] Completed comforming her inability to manage her affairs due to vascular dementia.

## 2012-04-21 ENCOUNTER — Other Ambulatory Visit: Payer: Self-pay | Admitting: Family Medicine

## 2012-04-21 MED ORDER — CALCIUM CITRATE 200 MG PO TABS: 600.0000 mg | ORAL_TABLET | Freq: Two times a day (BID) | ORAL | Status: DC

## 2012-04-23 LAB — BASIC METABOLIC PANEL
BUN: 26 mg/dL — AB (ref 4–21)
Creatinine: 0.7 mg/dL (ref 0.5–1.1)
Glucose: 108 mg/dL
Potassium: 4.2 mmol/L (ref 3.4–5.3)

## 2012-04-23 LAB — HEPATIC FUNCTION PANEL
AST: 17 U/L (ref 13–35)
Bilirubin, Direct: 0.1 mg/dL (ref 0.01–0.4)
Bilirubin, Total: 0.3 mg/dL

## 2012-04-23 LAB — LIPID PANEL: HDL: 34 mg/dL — AB (ref 35–70)

## 2012-04-23 LAB — CBC AND DIFFERENTIAL: WBC: 6 10^3/mL

## 2012-05-12 ENCOUNTER — Encounter: Payer: Self-pay | Admitting: Family Medicine

## 2012-05-12 ENCOUNTER — Non-Acute Institutional Stay: Payer: Self-pay | Admitting: Family Medicine

## 2012-05-12 DIAGNOSIS — F0150 Vascular dementia without behavioral disturbance: Secondary | ICD-10-CM

## 2012-05-12 NOTE — Assessment & Plan Note (Signed)
Trace edema but lungs clear. IF were to rapidly gain weight or have progressive SOB, would consider repeat echo.

## 2012-05-12 NOTE — Assessment & Plan Note (Addendum)
Continue aricept and namenda. Attempt repeat MMSE next visit. Per [facility] note, if patient stops feeding herself, she plans to stop namenda, aricpet. I agree with that plan.

## 2012-05-12 NOTE — Assessment & Plan Note (Signed)
a1c 6.4. Continue to monitor q6 months. Goal a1c 7.5 but would consider metformin if reached 7.

## 2012-05-12 NOTE — Progress Notes (Signed)
Subjective:  Nursing home visit   Patient's care is being coordinated currently with [facility] Hal Neer, NP. Patient was seen on 2/27. Patient's sister and POA [name] was contacted by Ms. Shaw as well.   Patient without complaints.   1. Hypertension/history CVA/vascular dementia- BP Readings from Last 3 Encounters:  05/12/12 117/72  03/27/12 126/71  02/16/12 128/73  Compliant with medications-yes without side effects. Lisinopril, amlodipine, metoprolol, clonidine.   Patient without complaints such as chest pain or SOB or worsening weakness or HA.   2. Wound care/ Stage 2 sacral wound-healing well per notes with 100% epithelization and now pink tissue overtop so likely stage I at this point.    Wound care seeing-Bunny boots for heels. Wedge to help with positioning.  protein supplements, frequent turns  3. Vitamin D, completed supplementation with increase to 27 ng/mL. Level was repeated and noted at 24. Patient is now   4. DVT-tolerating xarelto. No leg swelling or SOB reported.  #Health maintenance--flu shot given 12/08/11 Mammogram-Birads 2 with follow up in 1 year (full results dated 11/14). FOr some reason ultrasound and mammogram were repeated on 2/3 with notes to follow up in 1 year as previously ntoed.    ROS--See HPI  Past Medical History-smoking status noted: never smoker.  Reviewed problem list.  Medications- reviewed and updated Chief complaint-noted  Objective: BP 117/72  Pulse 62  Temp(Src) 100.2 F (37.9 C)  Resp 20  Wt 226 lb 3.2 oz (102.604 kg)  BMI 35.42 kg/m2 Gen: NAD, alert, responds to almost all questions but does not follow all commands. speaks when spoken to more frequently than last visit. Sentences lengthening each visit up to 4 words currently. Patient does not fall asleep during exam as she did 2 visists ago.  HEENT: EOMI, MMM, oropharynx clear without lesions CV: RRR, unchanged 4/6 SEM at LUSB Abdomen: soft/nontender/nondistended Lungs: CTAB, no  wheezes rales or rhonchi  Ext: trace edema bilateral legs.  Neuro: 5/5 grip strength bilateral hands. ABle to pull me towards her. Wiggles bilateral toes. Does not lift either leg.   Assessment/Plan:  Stage II ulcer-continue frequent turns and wound care per nursing staff.  Follow up labs including TSH and Microalbumin/cr ratio ordered by Hal Neer. As patient on ace inhibitor, unclear how microalb/cr ratio will benefit her.   Given increased interaction, would consider trying MMSE again at next visit.   Per Hal Neer,  there was a ? Of sores in the mouth which I did not see on limited exam as patient closed mouth but a dentist is to see patient on Friday.

## 2012-05-12 NOTE — Assessment & Plan Note (Signed)
Continue Xarelto. Cannot tell per records which extremity. No current SOB.

## 2012-05-12 NOTE — Assessment & Plan Note (Signed)
Continues to be followed by wound care. Improved to stage I per notes.

## 2012-05-12 NOTE — Assessment & Plan Note (Signed)
Back on monthly replacement. Recheck 6 months.

## 2012-05-12 NOTE — Assessment & Plan Note (Signed)
Well controlled. Continue current medications. BMET q6 months.

## 2012-05-24 LAB — CBC AND DIFFERENTIAL
HCT: 34 % — AB (ref 36–46)
Hemoglobin: 10.3 g/dL — AB (ref 12.0–16.0)

## 2012-05-24 LAB — BASIC METABOLIC PANEL
Creatinine: 0.7 mg/dL (ref 0.5–1.1)
Potassium: 4.6 mmol/L (ref 3.4–5.3)
Sodium: 147 mmol/L (ref 137–147)

## 2012-05-25 ENCOUNTER — Non-Acute Institutional Stay: Payer: Self-pay | Admitting: Family Medicine

## 2012-05-25 ENCOUNTER — Encounter: Payer: Self-pay | Admitting: Family Medicine

## 2012-05-25 DIAGNOSIS — I10 Essential (primary) hypertension: Secondary | ICD-10-CM

## 2012-05-25 NOTE — Assessment & Plan Note (Addendum)
KUB of abdomen showed ileus without signs of obstruction WBC 5.2 K Hgb 10.3 Lytes normal. Gluc 149 BUN 26 Creat 0.70  Likely viral gastroenteritis though no known ill contacts. Will see how she tolerates advancing her diet Doesn't appear to be dehydrated

## 2012-05-25 NOTE — Assessment & Plan Note (Signed)
Hemoglobin decreased to 10.3 with normal MCV and mildly elevated RDW Reevaluate after acute illness.

## 2012-05-25 NOTE — Progress Notes (Signed)
  Subjective:    Patient ID: [name], female    DOB: 1934/04/20, 78 y.o.   MRN: [id number]  HPI She was seen by Donald Prose yesterday for vomiting. None today. She is a poor historian due to dementia, but doesn't volunteer any abdominal symptoms now.   She was given some IV fluids last evening Review of Systems     Objective:   Physical Exam  Constitutional:  Obese, comfortable appearing  HENT:  Mouth/Throat: Oropharynx is clear and moist.  Cardiovascular: Normal rate and regular rhythm.   Murmur heard. G3/6 SEM  Pulmonary/Chest: Effort normal.  Abdominal: Soft. She exhibits no distension. There is no tenderness. There is no rebound and no guarding.  Decreased bowel sounds.  Neurological: She is alert.  disoriented          Assessment & Plan:

## 2012-05-25 NOTE — Assessment & Plan Note (Signed)
well controlled  

## 2012-06-02 ENCOUNTER — Encounter: Payer: Self-pay | Admitting: Family Medicine

## 2012-06-02 ENCOUNTER — Encounter: Payer: Self-pay | Admitting: Pharmacist

## 2012-06-08 ENCOUNTER — Non-Acute Institutional Stay (INDEPENDENT_AMBULATORY_CARE_PROVIDER_SITE_OTHER): Payer: Medicare Other | Admitting: Family Medicine

## 2012-06-08 DIAGNOSIS — L89109 Pressure ulcer of unspecified part of back, unspecified stage: Secondary | ICD-10-CM

## 2012-06-08 DIAGNOSIS — L89159 Pressure ulcer of sacral region, unspecified stage: Secondary | ICD-10-CM | POA: Insufficient documentation

## 2012-06-08 DIAGNOSIS — L8992 Pressure ulcer of unspecified site, stage 2: Secondary | ICD-10-CM

## 2012-06-08 DIAGNOSIS — L89152 Pressure ulcer of sacral region, stage 2: Secondary | ICD-10-CM

## 2012-06-08 NOTE — Progress Notes (Signed)
  Subjective:    Patient ID: [name], female    DOB: 04-13-1934, 78 y.o.   MRN: [id number]  HPI 1. Sacral ulcer:  Sacral ulcer noted by staff.  Had diarrhea last week and likely 2/2 to pressure and maceration.  Diarrhea has improved.  Currently hydrocolloid dressing is being applied, she denies pain.    Review of Systems Per HPI    Objective:   Physical Exam  Constitutional:  Obese female, nad   Skin:  SMall sacral ulceration covered with hydrocolloid dressing.           Assessment & Plan:

## 2012-06-08 NOTE — Assessment & Plan Note (Signed)
Did not remove hydrocolloid dressing today, likely small stage II.  Wound care following. Continue hydrocolloid until healed.

## 2012-06-09 ENCOUNTER — Encounter: Payer: Self-pay | Admitting: Pharmacist

## 2012-06-22 ENCOUNTER — Encounter: Payer: Self-pay | Admitting: Family Medicine

## 2012-06-22 ENCOUNTER — Non-Acute Institutional Stay (INDEPENDENT_AMBULATORY_CARE_PROVIDER_SITE_OTHER): Payer: Medicare Other | Admitting: Family Medicine

## 2012-06-22 DIAGNOSIS — I10 Essential (primary) hypertension: Secondary | ICD-10-CM

## 2012-06-22 DIAGNOSIS — I359 Nonrheumatic aortic valve disorder, unspecified: Secondary | ICD-10-CM

## 2012-06-22 DIAGNOSIS — E559 Vitamin D deficiency, unspecified: Secondary | ICD-10-CM

## 2012-06-22 DIAGNOSIS — I350 Nonrheumatic aortic (valve) stenosis: Secondary | ICD-10-CM

## 2012-06-22 DIAGNOSIS — L8992 Pressure ulcer of unspecified site, stage 2: Secondary | ICD-10-CM

## 2012-06-22 DIAGNOSIS — F0150 Vascular dementia without behavioral disturbance: Secondary | ICD-10-CM

## 2012-06-22 DIAGNOSIS — L89152 Pressure ulcer of sacral region, stage 2: Secondary | ICD-10-CM

## 2012-06-22 DIAGNOSIS — I82409 Acute embolism and thrombosis of unspecified deep veins of unspecified lower extremity: Secondary | ICD-10-CM

## 2012-06-22 DIAGNOSIS — L89109 Pressure ulcer of unspecified part of back, unspecified stage: Secondary | ICD-10-CM

## 2012-06-22 NOTE — Assessment & Plan Note (Signed)
Well controlled. Continue current meds

## 2012-06-22 NOTE — Assessment & Plan Note (Signed)
No signs of worsening on exam. Consider repeat echo in 9/15 if no worsening on exam before that time.

## 2012-06-22 NOTE — Assessment & Plan Note (Signed)
Recheck in August.

## 2012-06-22 NOTE — Assessment & Plan Note (Signed)
Continue xarelto. Consider CBC in august to follow up anemia.

## 2012-06-22 NOTE — Assessment & Plan Note (Addendum)
MMSE attempted but patient did not participate so assumed 0. Primarily dependent on someone feeding her at this point. Will continue to follow over next 2 months, but if this continues to worsen, would strongly consider stopping namenda and aricept.   Continue aggrenox for history of CVA.

## 2012-06-22 NOTE — Progress Notes (Signed)
Subjective:  Nursing home visit   Patient's care is being coordinated currently with [facility] Hal Ne[facility], NP. Patient was seen around the time of her GI illness around 3/12 (GI illness has now resolved). Patient's sister and POA [name] was contacted by Ms. Shaw at that time.   Patient without complaints. Nursing Staff reports patient can feed herself some finger foods but is mainly dependent on staff for feeds. Report she can lift things with her left hand  1. Hypertension/history CVA/vascular dementia- BP Readings from Last 3 Encounters:  06/22/12 108/61  05/25/12 138/82  05/12/12 117/72  Review of Blood pressures over last month with only 1 reading SBP >140. Compliant with medications-yes without side effects. Lisinopril, amlodipine, metoprolol, clonidine.   Patient without complaints such as chest pain or SOB or worsening weakness or HA.    2. Wound care/ Stage 2 sacral wound--evaluated by Dr. Ashley Royalty on 2/26. Had hydrocolloid dressing at that time. Nurse aid mentioned to me that this had fallen off and looked slightly worse than the last time she saw it a few weeks ago. Patient denies pain. Patient would not participate in turning for me. Wound care had seen previously-Bunny boots for heels. Wedge to help with positioning.  protein supplements, frequent turns. I ordered a repeat evaluation by wound care.   3. Vitamin D, completed supplementation with increase to 27 ng/mL. Level was repeated and noted at 24. Patient taking monthly. Will plan on repeat levels in 6 months.   4. DVT-tolerating xarelto. No leg swelling or SOB reported.    ROS--See HPI  Past Medical History-smoking status noted: never smoker.  Reviewed problem list.  Medications- reviewed and updated Chief complaint-noted  Objective: BP 108/61  Pulse 88  Temp(Src) 98.5 F (36.9 C)  Resp 18  Wt 226 lb 12.8 oz (102.876 kg)  BMI 35.51 kg/m2 Gen: NAD, alert, responds to almost all questions but does not follow all  commands. HEENT: EOMI, MMM, oropharynx clear without lesions CV: RRR, unchanged 4/6 SEM at LUSB Abdomen: soft/nontender/nondistended Lungs: CTAB, no wheezes rales or rhonchi  Ext: trace edema bilateral legs, heel boots in place Neuro: 5/5 grip strength in bilateral hands.  ABle to pull me towards her. Wiggles bilateral toes. Does not lift either leg.   MMSE attempted-patient was interactive before I started asking questions then did not repond to a single question from MMSE and blankly started at me.   Assessment/Plan:  #Health maintenance-- Mammogram-Birads 2 with follow up in 1 year (full results dated 11/14). FOr some reason ultrasound and mammogram were repeated on 2/3 with notes to follow up in 1 year as previously ntoed.  Given advanced dementia and current quality of life-unclear of benefit of continued mammograms and colonoscopy. I do not intend to order at this time, will follow if ordered by Ms. Clelia Croft.

## 2012-06-22 NOTE — Assessment & Plan Note (Signed)
Ordered repeat wound care consult. Slightly worsened per staff.

## 2012-07-08 ENCOUNTER — Encounter: Payer: Self-pay | Admitting: Family Medicine

## 2012-07-27 ENCOUNTER — Non-Acute Institutional Stay: Payer: Medicare Other | Admitting: Family Medicine

## 2012-07-27 DIAGNOSIS — D493 Neoplasm of unspecified behavior of breast: Secondary | ICD-10-CM

## 2012-07-27 DIAGNOSIS — R4701 Aphasia: Secondary | ICD-10-CM

## 2012-07-27 DIAGNOSIS — I10 Essential (primary) hypertension: Secondary | ICD-10-CM

## 2012-07-27 NOTE — Assessment & Plan Note (Signed)
Well controlled today.

## 2012-07-27 NOTE — Progress Notes (Signed)
  Subjective:    Patient ID: [name], female    DOB: 1935-01-07, 78 y.o.   MRN: [id number]  HPI  78 year old Nursing Home resident seen at [facility] for routine follow-up.  Patient is alert, but nonverbal- Aphasia.  Appears comfortable.  Nursing has no concerns.  Reports of recent serous drainage from right breast cyst. Review of Systems See HPI    Objective:   Physical Exam GEN: Alert, No acute distress.  Laying in bed.   CV:  Regular Rate & Rhythm, no murmur Respiratory:  Normal work of breathing, CTAB Abd:  + BS, soft, no tenderness to palpation Ext: no pre-tibial edema.  No heel wounds. Breast:  Left breast with cyst on skin media lof areola with dressing overlying.  No cellulitis.  Patient does not to be in pain on palpation.         Assessment & Plan:

## 2012-07-27 NOTE — Assessment & Plan Note (Addendum)
Area of draiange consistent with seroma.  Does not appear distressing to patient.  No signs of cellulitis.  Continue to monitor.

## 2012-07-27 NOTE — Assessment & Plan Note (Addendum)
Continued aphasia with limited interaction.  PCP to address quality of life/goals of care and code status with family.

## 2012-08-22 ENCOUNTER — Telehealth: Payer: Self-pay | Admitting: Family Medicine

## 2012-08-22 ENCOUNTER — Telehealth: Payer: Self-pay | Admitting: Emergency Medicine

## 2012-08-22 MED ORDER — LEVOFLOXACIN 750 MG PO TABS: 750.0000 mg | ORAL_TABLET | Freq: Every day | ORAL | Status: AC

## 2012-08-22 NOTE — Telephone Encounter (Signed)
Day team received a call from [facility] late afternoon informing us that [name] was more reticent than usual, tachypneic and hypertensive SBP 140-180s. Afebrile and otherwise comfortable.  CXR ordered>>>received call with results around 9-10 pm. Left lower lobe infiltrate. O2 saturations 86% on RA. She had eaten 100% of her dinner and did not appear to be in distress.   A/P: This is a 69 YOF who resides at [facility] who has past medical history significant for vascular dementia, mistral stenosis/aortic stenosis, S2 CKD (last Cr 05/2012 0.70), and aphasia who presented with tachypnea/elevated blood pressures/more aphasic than usual (she apparently makes noises but had not been making as many noises today) who has CXR findings consistent with pneumonia and hypoxia.  -O2 to maintain saturations >90% -Levaquin 750 mg PO x 5 days (consider extending to 7-14 days for healthcare associated pneumonia)

## 2012-08-22 NOTE — Telephone Encounter (Signed)
Emergency Line Call Rn called to report that [name] was not acting herself this evening.  On vital signs, she was hypertensive to 140s-180s, tachypneic at 24, but afebrile.  She does not have a cough.  She is non-verbal at baseline.  Will get a chest x-ray to look for pneumonia.  RN will page upper level pager with results.

## 2012-08-23 ENCOUNTER — Non-Acute Institutional Stay (INDEPENDENT_AMBULATORY_CARE_PROVIDER_SITE_OTHER): Payer: Medicare Other | Admitting: Family Medicine

## 2012-08-23 DIAGNOSIS — I359 Nonrheumatic aortic valve disorder, unspecified: Secondary | ICD-10-CM

## 2012-08-23 DIAGNOSIS — I350 Nonrheumatic aortic (valve) stenosis: Secondary | ICD-10-CM

## 2012-08-23 DIAGNOSIS — D493 Neoplasm of unspecified behavior of breast: Secondary | ICD-10-CM

## 2012-08-23 DIAGNOSIS — J189 Pneumonia, unspecified organism: Secondary | ICD-10-CM

## 2012-08-23 DIAGNOSIS — I82409 Acute embolism and thrombosis of unspecified deep veins of unspecified lower extremity: Secondary | ICD-10-CM

## 2012-08-23 NOTE — Assessment & Plan Note (Signed)
10+ weight gain in past month without findings of CHF. Will reweigh today

## 2012-08-23 NOTE — Assessment & Plan Note (Signed)
Remains very indurated. Consider surgeon reevaluation

## 2012-08-23 NOTE — Assessment & Plan Note (Signed)
No edema or tenderness to suggest DVT and is on Xarelto, so PE very unlikely

## 2012-08-23 NOTE — Assessment & Plan Note (Addendum)
"  modest RLL infiltrate" on portable CHEST XRAY without crackles or wheezes heard, but increased work of breathing and grunting, was started on Levaquin by Dr Sharol Given CBC with dif, CMET ordered since she remains afebrile.

## 2012-08-23 NOTE — Progress Notes (Signed)
  Subjective:    Patient ID: [name], female    DOB: 1934/11/21, 78 y.o.   MRN: [id number]  HPI After hours call last pm for dyspnea.  Single O2 reading of 87%, has been normal otherwise even off O2.  CXR ordered by on call resident that showed "modest RLL infiltrate".  She was started on Levaquin for treatment of PNA   Review of Systems     Objective:   Physical Exam  Constitutional:  Generalized obesity Lying in bed Can't seem to roll on her side without help   Cardiovascular: Regular rhythm.   Murmur heard. Bradycardic but regular G4/6 SEM over entire precordium  Pulmonary/Chest: She has no wheezes. She has no rales.  Appears to be self-peeping with slight end of expiration grunt.   Abdominal: Soft. She exhibits no distension. There is no tenderness. There is no guarding.  Musculoskeletal: She exhibits no edema.  No presacral edema or leg tenderness  Neurological: She is alert.  Disoriented Single word responses that don't seem reliable  Skin: Skin is warm.  Psychiatric:  Gets "wide eyed" when asked questions.           Assessment & Plan:

## 2012-08-29 ENCOUNTER — Telehealth: Payer: Self-pay | Admitting: *Deleted

## 2012-08-29 NOTE — Telephone Encounter (Signed)
xarelto approved through 08/26/2013 under Medicare Part D per fax received from [facility]. Wyatt Haste, RN-BSN

## 2012-08-31 ENCOUNTER — Non-Acute Institutional Stay: Payer: Medicare Other | Admitting: Family Medicine

## 2012-08-31 DIAGNOSIS — D493 Neoplasm of unspecified behavior of breast: Secondary | ICD-10-CM

## 2012-08-31 DIAGNOSIS — J189 Pneumonia, unspecified organism: Secondary | ICD-10-CM

## 2012-08-31 DIAGNOSIS — I359 Nonrheumatic aortic valve disorder, unspecified: Secondary | ICD-10-CM

## 2012-08-31 DIAGNOSIS — I10 Essential (primary) hypertension: Secondary | ICD-10-CM

## 2012-08-31 DIAGNOSIS — I350 Nonrheumatic aortic (valve) stenosis: Secondary | ICD-10-CM

## 2012-08-31 DIAGNOSIS — Z789 Other specified health status: Secondary | ICD-10-CM

## 2012-08-31 DIAGNOSIS — N182 Chronic kidney disease, stage 2 (mild): Secondary | ICD-10-CM

## 2012-08-31 DIAGNOSIS — Z9189 Other specified personal risk factors, not elsewhere classified: Secondary | ICD-10-CM

## 2012-08-31 NOTE — Assessment & Plan Note (Addendum)
Since on low dose furosemide, will recheck BMET for K and creatinine next week.  Probably was volume overloaded last week

## 2012-08-31 NOTE — Assessment & Plan Note (Signed)
well controlled  

## 2012-08-31 NOTE — Progress Notes (Signed)
  Subjective:    Patient ID: [name], female    DOB: 1934/03/28, 78 y.o.   MRN: [id number]  HPI Pneumonia - has finished antibiotic. Note from a few days ago that the family asked about a cough medicine. The CNA bathing her hasn't noted a cough, but does sweat a lot.   Dementia - no intelligible interactions.    Review of Systems     Objective:   Physical Exam  Cardiovascular: Normal rate and regular rhythm.   Murmur heard. G3/6 SEM  Pulmonary/Chest: Breath sounds normal. No respiratory distress. She has no wheezes. She has no rales.  Intermittent pursed lip exhalation, but normal work of breathing.   Abdominal: Soft. She exhibits no distension and no mass. There is no tenderness. There is no rebound and no guarding.  Very obese  Musculoskeletal: She exhibits edema.  Trace edema left ankle  Neurological: She is alert.  Wide eyed Can't follow instructions Verbalizes, but not intelligible          Assessment & Plan:

## 2012-08-31 NOTE — Assessment & Plan Note (Signed)
Clinically resolved, though still has pursed lip exhalation

## 2012-08-31 NOTE — Assessment & Plan Note (Signed)
May be causing CHF. Need to be careful not to reduce preload too much.

## 2012-09-01 ENCOUNTER — Non-Acute Institutional Stay (INDEPENDENT_AMBULATORY_CARE_PROVIDER_SITE_OTHER): Payer: Medicare Other | Admitting: Family Medicine

## 2012-09-01 DIAGNOSIS — L89152 Pressure ulcer of sacral region, stage 2: Secondary | ICD-10-CM

## 2012-09-01 DIAGNOSIS — I350 Nonrheumatic aortic (valve) stenosis: Secondary | ICD-10-CM

## 2012-09-01 DIAGNOSIS — L89109 Pressure ulcer of unspecified part of back, unspecified stage: Secondary | ICD-10-CM

## 2012-09-01 DIAGNOSIS — I359 Nonrheumatic aortic valve disorder, unspecified: Secondary | ICD-10-CM

## 2012-09-01 DIAGNOSIS — Z9189 Other specified personal risk factors, not elsewhere classified: Secondary | ICD-10-CM

## 2012-09-01 DIAGNOSIS — Z789 Other specified health status: Secondary | ICD-10-CM

## 2012-09-01 DIAGNOSIS — L8992 Pressure ulcer of unspecified site, stage 2: Secondary | ICD-10-CM

## 2012-09-01 DIAGNOSIS — I10 Essential (primary) hypertension: Secondary | ICD-10-CM

## 2012-09-01 DIAGNOSIS — I82409 Acute embolism and thrombosis of unspecified deep veins of unspecified lower extremity: Secondary | ICD-10-CM

## 2012-09-01 DIAGNOSIS — J189 Pneumonia, unspecified organism: Secondary | ICD-10-CM

## 2012-09-01 DIAGNOSIS — N182 Chronic kidney disease, stage 2 (mild): Secondary | ICD-10-CM

## 2012-09-01 NOTE — Assessment & Plan Note (Signed)
Intermittently elevated but for the most part well controlled. Continue current medications.

## 2012-09-01 NOTE — Assessment & Plan Note (Signed)
Other medical problems more pertinent and likely to lead to end of life than DM. Do not believe further a1c monitoring is valuable for longterm care.

## 2012-09-01 NOTE — Progress Notes (Signed)
Subjective:  Nursing home visit. Room 101 B Due to dementia, patient with minimal words and does not give ROS.  Patient's care is being coordinated currently with [facility], NP.   # RLL Pneumonia Patient with pneumonia s/p 5 days of levaquin starting 08/23/12. Being weaned off of oxygen now at 1L, sats in upper 90s. SLP eval has been ordered but do not see results from this for concern of aspiration contributing.   #Aortic Stenosis/? CHF contributing to respiratory distress/CKD Patient with 13 lbs weight loss since her recent illness but previously up 18 lbs over a month and concern for fluid overload. Evaluation being undergone to determine if CHF contributing to recent respiratory distress (BMET ordered, weekly weights x4, echocardiogram ordered as well-lat echo 11/2011). BNP ordered 08/23/12 and at 1026 but difficult to evaluate given serum creatinine of 1.3. She was started on lasix 20mg  daily to see if breathing would improve on 6/13.   # Hypertension/history CVA/vascular dementia BP Readings from Last 3 Encounters:  09/01/12 142/76  08/21/12 112/78  08/23/12 158/83  Review of Blood pressures over last month with most readings with SBP <140, although some elevations near 160.  Compliant with medications-yes without known side effects. Lisinopril, amlodipine, metoprolol, clonidine. Aggrenox for history CVA.   # Wound care/ Stage 2 sacral wound apparently this has reopened, being followed by wound care. Seems to worsen each time she has an acute illness (may be nutrition related). Patient would not participate in turning for me.  # DVT-tolerating xarelto.  Left leg is swollen >R No leg swelling. Oxygen requirement improving with time.     ROS--See HPI  Past Medical History-smoking status noted: never smoker.  Reviewed problem list.  Medications- reviewed and updated Chief complaint-noted  Objective: BP 142/76  Temp(Src) 98.1 F (36.7 C)  Resp 20  Wt 227 lb (102.967  kg)  BMI 35.55 kg/m2  SpO2 97% Gen: NAD, alert, does not answer any questions today, stares blankly at me HEENT: EOMI, MMM, oropharynx clear without lesions CV: RRR, unchanged 4/6 SEM at LUSB, no split s2 Abdomen: soft/nontender/nondistended Lungs: CTAB anteriorly, no wheezes rales or rhonchi. Patient will not participate with sitting up or rolling over. Non labored breathing. No pursed lip breathing today.  Ext: 1+ pitting edema left leg, no edema in right leg  Assessment/Plan:

## 2012-09-01 NOTE — Assessment & Plan Note (Signed)
bmet being checked this week. Last cr 1.3.

## 2012-09-01 NOTE — Assessment & Plan Note (Addendum)
Concern that AS has progressed to now be causing CHF. Will await echo results but this needs to be a part of GOC discussion if it has worsened as part of an overall picture of declining health. Need to readdress full code status.   WIll await echo but suspect that lasix should be stopped given 13 lbs weight loss.

## 2012-09-01 NOTE — Assessment & Plan Note (Signed)
On xarelto. Does have unilateral leg swelling but will follow clinically for now as RR, respiratory effort, and oxygen requirement have improved with PNA treatment. If patient were to change clinical course, may need venous duplex or CTA but once again, need more information including echocardiogram at this point before GOC discussion.

## 2012-09-01 NOTE — Assessment & Plan Note (Signed)
Resolved essentially. Will await further wean of oxygen though strongly suspect patient could tolerate.

## 2012-09-01 NOTE — Assessment & Plan Note (Signed)
Followed by wound care 

## 2012-09-01 NOTE — Assessment & Plan Note (Signed)
DId not speak with staff today about if patient is feeding herself. Would wait til over acute illness to make decision but I am not sure that the namenda/aricept are providing much benefit at this time given advanced stages of dementia. WIll broach subject when contacting the family about stopping these medications if no improvement after over PNA.

## 2012-09-20 ENCOUNTER — Telehealth: Payer: Self-pay | Admitting: Family Medicine

## 2012-09-20 ENCOUNTER — Encounter: Payer: Self-pay | Admitting: Family Medicine

## 2012-09-20 NOTE — Telephone Encounter (Signed)
Spoke with sister Colvin Caroli. Discussed the following:  1. Dementia-patient does feed herself some finger foods but is primarily fed by staff. Discussed with sister that I am not sure how much Namenda/aricept likely are not very beneficial for patient given her level of dementia (and specifically that this is likely more vascular dementia as well). Asked sister to think about allowing [place] to pull off these medicines.   2. History DVT-discussed over 6 months treatment. Sister has worked as a Engineer, civil (consulting) and is very concerned that since she is essentially bedbound, she is concerned about risk of taking her off Xarelto. I shared with sister I had similar concerns but if we were able to mobilize patient a little more through PT may be able to minimize risk. Sister did not think this was likely to happen and requested continue medication. We will readdress in 2 months.   3. Code status-wants Joann to remain full code for now. States she has felt somewhat "pressured" to change this by friends and previous contacts from medical professionals. Family including other sisters are not ready to make this transition. [name] wants to see if patient will have any improvement after getting over PNA. Tried to see what sister thought of current quality of life-which she agrees is minimal. Discussed that this would decrease if patient were to need invasive interventions. Sister would like to continue as full code. We will readdress in 2 months.   -Tana Conch, MD, PGY3

## 2012-10-26 ENCOUNTER — Non-Acute Institutional Stay: Payer: Medicare Other | Admitting: Family Medicine

## 2012-10-26 DIAGNOSIS — I82402 Acute embolism and thrombosis of unspecified deep veins of left lower extremity: Secondary | ICD-10-CM

## 2012-10-26 DIAGNOSIS — F0150 Vascular dementia without behavioral disturbance: Secondary | ICD-10-CM

## 2012-10-26 DIAGNOSIS — I82409 Acute embolism and thrombosis of unspecified deep veins of unspecified lower extremity: Secondary | ICD-10-CM

## 2012-10-26 DIAGNOSIS — I10 Essential (primary) hypertension: Secondary | ICD-10-CM

## 2012-10-26 DIAGNOSIS — L89109 Pressure ulcer of unspecified part of back, unspecified stage: Secondary | ICD-10-CM

## 2012-10-26 DIAGNOSIS — D493 Neoplasm of unspecified behavior of breast: Secondary | ICD-10-CM

## 2012-10-26 DIAGNOSIS — L89151 Pressure ulcer of sacral region, stage 1: Secondary | ICD-10-CM

## 2012-10-26 DIAGNOSIS — L8991 Pressure ulcer of unspecified site, stage 1: Secondary | ICD-10-CM

## 2012-10-26 NOTE — Assessment & Plan Note (Signed)
DVT is resolved with unremarkable physical exam -Patient has completed 6 months of Xarelto however family would like to continue with anticoagulation given nonambulatory status

## 2012-10-26 NOTE — Progress Notes (Signed)
  Subjective:    Patient ID: [name], female    DOB: 09-14-1934, 78 y.o.   MRN: [id number]  HPI 78 year old African American female with past medical history of dementia, CVA, hypertension, depression seen and evaluated at [facility] nursing home. Patient is able to provide little to no history of present illness. She states that she is in pain however unable to localize pain. Per nursing staff no acute issues identified. Spoke to wound nurse who stated that her sacral decubitus ulcer is healing well and is now stage I and have been applying barrier cream to the area  Reviewed past medical history. Patient is nonsmoker   Review of Systems Patient unable to provide review of systems. Per nursing staff no fevers or chills, tolerating diet, no nausea vomiting, no diarrhea constipation.    Objective:   Physical Exam Vitals: Reviewed. Blood pressure range of 124-139/74-81. General: Patient lying in bed. African American female, obese, able to identify herself by name. Unable to answer location or date HEENT: Pupils equal in size, extraocular movements are intact, patient moist mucous membranes, neck was supple, no anterior or posterior lymphadenopathy identified Cardiac: Regular rate and rhythm, 3/6 systolic murmur was present, no heaves or thrills, S1 and S2 present Respiratory: Poor effort, clear to auscultation bilaterally from anterior auscultation  Breast: Right breast-fluctuance present at the 12:00 position without active drainage, breast tissue palpates as full/tense. Area is not warm, no erythema Abdomen: Hernia present without evidence of incarceration, bowel sounds present, nontender Extremities: Minimal edema, 2+ dorsalis pedis pulses, no calf tenderness Neurologic: Unable to fully evaluate as patient was uncooperative with exam  Review of laboratory performed on 09/27/2012: Sodium 144, potassium 4.6, chloride 110, bicarbonate 26, BUN 37, creatinine 1.09, glucose of 121, calcium of  8.9. BNP of 147  Review of imaging: Echocardiogram performed on 09/08/2012 showed ejection fraction of 55-60%, LVH present, mild to moderate aortic regurgitation     Assessment & Plan:  Please see problem specific assessment and plan

## 2012-10-26 NOTE — Assessment & Plan Note (Signed)
Well controlled on current regimen of metoprolol, clonidine, lisinopril. -No changes to medications made today

## 2012-10-26 NOTE — Assessment & Plan Note (Signed)
Dementia appears stable. Agree with Dr. Durene Cal (patient's PCP) that she may no longer be benefiting from Namenda and Aricept -Will discuss with PCP that additional conversation with family may be warranted to discuss discontinuation of current dementia medications

## 2012-10-26 NOTE — Assessment & Plan Note (Signed)
Ulcer has greatly improved. No stage I -Continue barrier cream per wound care team at nursing home

## 2012-10-26 NOTE — Assessment & Plan Note (Signed)
Patient continues to have significant swelling of the right breast, reviewed previous ultrasound performed in February of 2014 which is suggestive of seroma, no acute evidence of infection -Will discuss with PCP if additional evaluation is needed such as surgical referral for drainage\excision

## 2012-10-27 ENCOUNTER — Non-Acute Institutional Stay (INDEPENDENT_AMBULATORY_CARE_PROVIDER_SITE_OTHER): Payer: Medicare Other | Admitting: Family Medicine

## 2012-10-27 DIAGNOSIS — N182 Chronic kidney disease, stage 2 (mild): Secondary | ICD-10-CM

## 2012-10-27 DIAGNOSIS — I359 Nonrheumatic aortic valve disorder, unspecified: Secondary | ICD-10-CM

## 2012-10-27 DIAGNOSIS — I350 Nonrheumatic aortic (valve) stenosis: Secondary | ICD-10-CM

## 2012-10-27 DIAGNOSIS — F0150 Vascular dementia without behavioral disturbance: Secondary | ICD-10-CM

## 2012-10-27 DIAGNOSIS — D493 Neoplasm of unspecified behavior of breast: Secondary | ICD-10-CM

## 2012-10-27 DIAGNOSIS — I82409 Acute embolism and thrombosis of unspecified deep veins of unspecified lower extremity: Secondary | ICD-10-CM

## 2012-10-27 NOTE — Progress Notes (Signed)
Subjective:  Nursing home visit. Room 101 B Due to dementia, patient with minimal words and does not give ROS.  Patient's care is being coordinated currently with [facility] Hal Neer, NP.    # CHF due to valvular disease-Aortic Stenosis vs. Regurgitation. CKD Echocardiogram performed on 09/08/2012 showed ejection fraction of 55-60%, LVH present, mild to moderate aortic regurgitation. Aortic sclerosis without stenosis. This varies significantly from 12/07/11 echocardiogram report which states severe stenosis and mild regurgitation.   Regardless weight is down nearly 20 lbs from peak. Bmet has been ordered for next set of labs.   # Hypertension/history CVA/vascular dementia BP Readings from Last 3 Encounters:  10/30/12 138/76  10/26/12 139/81  09/01/12 142/76  Review of Blood pressures over last month with most readings with SBP <140, although some elevations near 160.  Compliant with medications-yes without known side effects. Lisinopril, amlodipine, metoprolol, clonidine. Aggrenox for history CVA.   # DVT-tolerating xarelto. HCPOA last discussion wanted to continue indefinitely.    ROS--See HPI  Past Medical History-smoking status noted: never smoker.  Reviewed problem list.  Medications- reviewed and updated Chief complaint-noted  Objective: Filed Vitals:   10/30/12 1654  BP: 138/76  Pulse: 84  Temp: 98.8 F (37.1 C)  Resp: 18    Gen: NAD, alert, says good morning but few other words HEENT: EOMI, MMM, oropharynx clear without lesions CV: RRR, unchanged 4/6 SEM at LUSB, s2 still audible Abdomen: soft/nontender/nondistended Breast exam: 3x3 cm are of seroma extending to skin at 12 o clock position with deep/firm underlying area of at least 10 x 10 cm. I have not examined area previously. Patient seems in no pain or distress.  Lungs: CTAB anteriorly, no wheezes rales or rhonchi. Patient will not participate with sitting up or rolling over. Non labored breathing. No pursed  lip breathing today.  Ext: no edema  Assessment/Plan:

## 2012-10-30 NOTE — Assessment & Plan Note (Signed)
Seromas on [place] were stable x 2 recently. Will discuss with HCPOA in coming weeks but do not believe surgical referral at this time is warranted given stability of them as well as no obvious distress and fact that fluid removal could cause stress and these areas could likely reaccumulate anyway.

## 2012-10-30 NOTE — Assessment & Plan Note (Addendum)
Not commented on in most recent echo. Only aortic sclerosis without stenosis. Unclear if regurgitation was reason for fluid overload and worsened breathing after pneumonia but given this echo, lean towards primarily being pneumonia.   Continue lasix at 20mg  pending bmet.

## 2012-10-30 NOTE — Assessment & Plan Note (Signed)
BMET ordered for next lab draw. May need to titrate lasix 20 mg down to prn or perhaps every other day considering significant weight loss. Patient though does seem euvolemic.

## 2012-10-30 NOTE — Assessment & Plan Note (Signed)
Will need to discuss plan  again with HCPOA (continued xarelto as nonambulatory). Plan to call in coming weeks.

## 2012-10-30 NOTE — Assessment & Plan Note (Signed)
Do not believe namenda/aricept are beneficial at this stage given vascular dementia and severity of dementia. Will dicuss with HCPOA in coming weeks.

## 2012-11-01 ENCOUNTER — Encounter: Payer: Self-pay | Admitting: Pharmacist

## 2012-11-03 ENCOUNTER — Telehealth: Payer: Self-pay | Admitting: Family Medicine

## 2012-11-03 NOTE — Telephone Encounter (Signed)
Message copied by Shelva Majestic on Thu Nov 03, 2012 10:56 AM ------      Message from: Shelva Majestic      Created: Sun Oct 30, 2012  5:24 PM       Discuss with sister/HCPOA      1. Namenda/aricept      2. Xarelto      3. Breast seroma      4. Code status ------

## 2012-11-03 NOTE — Telephone Encounter (Signed)
Discussed with sister/HCPOA  1. Namenda/aricept-stop namenda, continue aricept for now and continue to monitor. Clinical decline would be more likely due to disease process than to medication being taken off.   2. Xarelto for DVT-agreeable to lovenox prophylactic dose if available at nursing home 3. Breast seroma-continue to monitor, do not send to surgeon at this time  4. Code status-full code

## 2012-12-02 ENCOUNTER — Encounter: Payer: Self-pay | Admitting: Pharmacist

## 2012-12-14 ENCOUNTER — Other Ambulatory Visit: Payer: Self-pay | Admitting: Pharmacist

## 2012-12-16 ENCOUNTER — Non-Acute Institutional Stay (INDEPENDENT_AMBULATORY_CARE_PROVIDER_SITE_OTHER): Payer: Medicare Other | Admitting: Family Medicine

## 2012-12-16 DIAGNOSIS — I359 Nonrheumatic aortic valve disorder, unspecified: Secondary | ICD-10-CM

## 2012-12-16 DIAGNOSIS — I82402 Acute embolism and thrombosis of unspecified deep veins of left lower extremity: Secondary | ICD-10-CM

## 2012-12-16 DIAGNOSIS — I10 Essential (primary) hypertension: Secondary | ICD-10-CM

## 2012-12-16 DIAGNOSIS — I82409 Acute embolism and thrombosis of unspecified deep veins of unspecified lower extremity: Secondary | ICD-10-CM | POA: Insufficient documentation

## 2012-12-16 DIAGNOSIS — F0150 Vascular dementia without behavioral disturbance: Secondary | ICD-10-CM

## 2012-12-16 DIAGNOSIS — I350 Nonrheumatic aortic (valve) stenosis: Secondary | ICD-10-CM

## 2012-12-16 DIAGNOSIS — D493 Neoplasm of unspecified behavior of breast: Secondary | ICD-10-CM

## 2012-12-17 ENCOUNTER — Telehealth: Payer: Self-pay | Admitting: Sports Medicine

## 2012-12-17 NOTE — Telephone Encounter (Signed)
[facility] 24 Hour After-hours Emergency Line  Patient name: [name]  MRN: [id number]  AGE: 78 y.o.  Gender: female DOB: Jul 05, 1934     Primary Care Provider:   Tana Conch, MD     Call from [facility].  Results of LE Venous Duplex is + for left superficial femoral thrombus.  Given repeat clot formation in less than 5 days from stopping Xarelto will continue with anticoagulation although no clear indication given superficial clot.  Discussed with Dr. Mikel Cella (GERI resident) before she left who agrees with plan and will reassess on Monday with GERI team.   --- DISPOSITION: + Superficial Clot, Keep on Xarelto, Reassess on Monday.   Andrena Mews, DO [name] Resident - PGY-3 12/17/2012 10:12 AM

## 2012-12-18 NOTE — Assessment & Plan Note (Signed)
Seroma stable. No intervention planned. See last phone note with HCPOA.

## 2012-12-18 NOTE — Assessment & Plan Note (Signed)
Typically well controlled with last 2 days being variable with 97/71 listed one day and then one with SBP >160. Will continue to monitor as would be difficult to adjust off of these numbers.

## 2012-12-18 NOTE — Assessment & Plan Note (Signed)
Will need to discuss with HCPOA once again, but do not believe aricept is benefitting patient. Had planned on stopping namenda after last visit but I am not certain I placed this order last time. Will discuss with team because if it has not yet been removed, would remove that first then later seek to stop aricept with HCPOA. If it has been stopped, would seek discussion with HCPOA to stop aricept as well.

## 2012-12-18 NOTE — Assessment & Plan Note (Signed)
Murmur stable. No respiratory distress.

## 2012-12-18 NOTE — Progress Notes (Signed)
Subjective:  Nursing home visit. Room 101 B Due to dementia, patient with minimal words and does not give ROS.  Patient's care is being coordinated currently with [facility] Hal Neer, NP.    # CHF due to valvular disease-Aortic Stenosis vs. Regurgitation. CKD Echocardiogram performed on 09/08/2012 showed ejection fraction of 55-60%, LVH present, mild to moderate aortic regurgitation. Aortic sclerosis without stenosis. This varies significantly from 12/07/11 echocardiogram report which states severe stenosis and mild regurgitation.   Regardless weight is down nearly 20 lbs from peak. Bmet has been ordered for next set of labs.   # Hypertension/history CVA/vascular dementia BP Readings from Last 3 Encounters:  10/30/12 138/76  10/26/12 139/81  09/01/12 142/76  Review of Blood pressures over last month with most readings with SBP <140, although some elevations near 160.  Compliant with medications-yes without known side effects. Lisinopril, amlodipine, metoprolol, clonidine. Aggrenox for history CVA.   # DVT-tolerating xarelto. HCPOA last discussion wanted to continue indefinitely.    ROS--See HPI  Past Medical History-smoking status noted: never smoker.  Reviewed problem list.  Medications- reviewed and updated Chief complaint-noted  Objective: There were no vitals filed for this visit.  Gen: NAD, alert, says good morning but few other words HEENT: EOMI, MMM, oropharynx clear without lesions CV: RRR, unchanged 4/6 SEM at LUSB, s2 still audible Abdomen: soft/nontender/nondistended Breast exam: 3x3 cm are of seroma extending to skin at 12 o clock position with deep/firm underlying area of at least 10 x 10 cm. I have not examined area previously. Patient seems in no pain or distress.  Lungs: CTAB anteriorly, no wheezes rales or rhonchi. Patient will not participate with sitting up or rolling over. Non labored breathing. No pursed lip breathing today.  Ext: no  edema  Assessment/Plan:

## 2012-12-18 NOTE — Assessment & Plan Note (Addendum)
L leg slightly larger and with pitting edema on exam. Got venous duplex and restarted xarelto. Had a left superficial femoral clot, not DVT. Not a true indication for xarelto but given quick reformation of clot (<5 days off xarelto) will need to discuss with geri team/attendings on whether to continue long term anticoagulation.

## 2012-12-19 ENCOUNTER — Other Ambulatory Visit: Payer: Self-pay | Admitting: Family Medicine

## 2012-12-19 MED ORDER — LORATADINE 10 MG PO TABS: 10.0000 mg | ORAL_TABLET | Freq: Every day | ORAL | Status: DC

## 2012-12-22 ENCOUNTER — Other Ambulatory Visit: Payer: Self-pay | Admitting: Family Medicine

## 2012-12-22 MED ORDER — RIVAROXABAN 20 MG PO TABS: 20.0000 mg | ORAL_TABLET | Freq: Every day | ORAL | Status: DC

## 2012-12-22 MED ORDER — RIVAROXABAN 15 MG PO TABS: 15.0000 mg | ORAL_TABLET | Freq: Two times a day (BID) | ORAL | Status: AC

## 2013-01-11 ENCOUNTER — Non-Acute Institutional Stay: Payer: Medicare Other | Admitting: Family Medicine

## 2013-01-11 DIAGNOSIS — I10 Essential (primary) hypertension: Secondary | ICD-10-CM

## 2013-01-11 DIAGNOSIS — F0150 Vascular dementia without behavioral disturbance: Secondary | ICD-10-CM

## 2013-01-11 DIAGNOSIS — I82409 Acute embolism and thrombosis of unspecified deep veins of unspecified lower extremity: Secondary | ICD-10-CM

## 2013-01-11 DIAGNOSIS — I82402 Acute embolism and thrombosis of unspecified deep veins of left lower extremity: Secondary | ICD-10-CM

## 2013-01-11 NOTE — Progress Notes (Signed)
Attending Nursing Home Note  Subjective:    Patient ID: [name], female    DOB: 1934-05-18, 78 y.o.   MRN: [id number]  HPI 78 yo F patient seen for routine follow.   Acute events: none.  Complaints: none  Updates: on xarelto 20 mg daily after t\hree week course of 15 mg BID.   Review of Systems Patient denies everything. Demented, so ROS unreliable     Objective: BP 119/75  Pulse 75  Temp(Src) 98.8 F (37.1 C)  Resp 20  Wt 221 lb 9.6 oz (100.517 kg)  BMI 34.7 kg/m2 Wt Readings from Last 3 Encounters:  12/15/12 221 lb 9.6 oz (100.517 kg)  12/18/12 219 lb (99.338 kg)  10/30/12 217 lb (98.431 kg)      Physical Exam Gen: NAD, alert, says good morning but few other words  HEENT: EOMI,  CV: RRR, unchanged 4/6 SEM at LUSB, s2 still audible  Abdomen: soft/nontender/nondistended  Lungs: CTAB anteriorly, no wheezes rales or rhonchi.. Non labored breathing. No pursed lip breathing  Ext: no edema Skin: intact. No heel ulcers. Sacrum not examined.     Assessment & Plan:

## 2013-01-11 NOTE — Assessment & Plan Note (Signed)
DVT x one.  SVT x one. Patient at high risk. Plan to continue xarelto 20 mg daily indefinitely.

## 2013-01-11 NOTE — Assessment & Plan Note (Signed)
A: patient calm. Due for repeat MMSE, suspect it will be very low given patient responses. She does follow commands. Not making conversation. aricept and namenda may still be benefiting patient in that she is calm, not impulsive, not agitated. Agree with PCP in that if we were to wean would start with namenda. Noted, recent phone call with PCP and patient's family. P:  D/C namenda.

## 2013-01-11 NOTE — Assessment & Plan Note (Signed)
Well controled on current regimen Meds: compliant Continue regimen

## 2013-01-14 ENCOUNTER — Encounter (HOSPITAL_COMMUNITY): Payer: Self-pay

## 2013-01-14 ENCOUNTER — Emergency Department (HOSPITAL_COMMUNITY): Payer: Medicare Other

## 2013-01-14 ENCOUNTER — Telehealth: Payer: Self-pay | Admitting: Family Medicine

## 2013-01-14 ENCOUNTER — Inpatient Hospital Stay (HOSPITAL_COMMUNITY): Payer: Medicare Other

## 2013-01-14 ENCOUNTER — Inpatient Hospital Stay (HOSPITAL_COMMUNITY)
Admission: EM | Admit: 2013-01-14 | Discharge: 2013-01-19 | DRG: 690 | Disposition: A | Discharge status: Skilled Nursing Facility | Payer: Medicare Other | Attending: Family Medicine | Admitting: Family Medicine

## 2013-01-14 DIAGNOSIS — I350 Nonrheumatic aortic (valve) stenosis: Secondary | ICD-10-CM

## 2013-01-14 DIAGNOSIS — I10 Essential (primary) hypertension: Secondary | ICD-10-CM | POA: Diagnosis present

## 2013-01-14 DIAGNOSIS — N390 Urinary tract infection, site not specified: Principal | ICD-10-CM | POA: Diagnosis present

## 2013-01-14 DIAGNOSIS — I129 Hypertensive chronic kidney disease with stage 1 through stage 4 chronic kidney disease, or unspecified chronic kidney disease: Secondary | ICD-10-CM | POA: Diagnosis present

## 2013-01-14 DIAGNOSIS — E860 Dehydration: Secondary | ICD-10-CM

## 2013-01-14 DIAGNOSIS — I359 Nonrheumatic aortic valve disorder, unspecified: Secondary | ICD-10-CM | POA: Diagnosis present

## 2013-01-14 DIAGNOSIS — N179 Acute kidney failure, unspecified: Secondary | ICD-10-CM

## 2013-01-14 DIAGNOSIS — L8992 Pressure ulcer of unspecified site, stage 2: Secondary | ICD-10-CM | POA: Diagnosis present

## 2013-01-14 DIAGNOSIS — I509 Heart failure, unspecified: Secondary | ICD-10-CM | POA: Diagnosis present

## 2013-01-14 DIAGNOSIS — R1310 Dysphagia, unspecified: Secondary | ICD-10-CM | POA: Diagnosis present

## 2013-01-14 DIAGNOSIS — I69959 Hemiplegia and hemiparesis following unspecified cerebrovascular disease affecting unspecified side: Secondary | ICD-10-CM

## 2013-01-14 DIAGNOSIS — M79609 Pain in unspecified limb: Secondary | ICD-10-CM

## 2013-01-14 DIAGNOSIS — D649 Anemia, unspecified: Secondary | ICD-10-CM | POA: Diagnosis present

## 2013-01-14 DIAGNOSIS — I69920 Aphasia following unspecified cerebrovascular disease: Secondary | ICD-10-CM

## 2013-01-14 DIAGNOSIS — L89109 Pressure ulcer of unspecified part of back, unspecified stage: Secondary | ICD-10-CM | POA: Diagnosis present

## 2013-01-14 DIAGNOSIS — L89509 Pressure ulcer of unspecified ankle, unspecified stage: Secondary | ICD-10-CM | POA: Diagnosis present

## 2013-01-14 DIAGNOSIS — Z515 Encounter for palliative care: Secondary | ICD-10-CM

## 2013-01-14 DIAGNOSIS — I672 Cerebral atherosclerosis: Secondary | ICD-10-CM | POA: Diagnosis present

## 2013-01-14 DIAGNOSIS — F0150 Vascular dementia without behavioral disturbance: Secondary | ICD-10-CM | POA: Diagnosis present

## 2013-01-14 DIAGNOSIS — E119 Type 2 diabetes mellitus without complications: Secondary | ICD-10-CM | POA: Diagnosis present

## 2013-01-14 DIAGNOSIS — F329 Major depressive disorder, single episode, unspecified: Secondary | ICD-10-CM | POA: Diagnosis present

## 2013-01-14 DIAGNOSIS — Z86718 Personal history of other venous thrombosis and embolism: Secondary | ICD-10-CM

## 2013-01-14 DIAGNOSIS — G9340 Encephalopathy, unspecified: Secondary | ICD-10-CM

## 2013-01-14 DIAGNOSIS — I639 Cerebral infarction, unspecified: Secondary | ICD-10-CM | POA: Diagnosis present

## 2013-01-14 DIAGNOSIS — E785 Hyperlipidemia, unspecified: Secondary | ICD-10-CM | POA: Diagnosis present

## 2013-01-14 DIAGNOSIS — F3289 Other specified depressive episodes: Secondary | ICD-10-CM | POA: Diagnosis present

## 2013-01-14 DIAGNOSIS — E559 Vitamin D deficiency, unspecified: Secondary | ICD-10-CM | POA: Diagnosis present

## 2013-01-14 DIAGNOSIS — N182 Chronic kidney disease, stage 2 (mild): Secondary | ICD-10-CM | POA: Diagnosis present

## 2013-01-14 DIAGNOSIS — R7989 Other specified abnormal findings of blood chemistry: Secondary | ICD-10-CM

## 2013-01-14 LAB — DIFFERENTIAL
Basophils Absolute: 0 10*3/uL (ref 0.0–0.1)
Basophils Relative: 0 % (ref 0–1)
Eosinophils Absolute: 0.1 10*3/uL (ref 0.0–0.7)
Monocytes Relative: 8 % (ref 3–12)
Neutrophils Relative %: 79 % — ABNORMAL HIGH (ref 43–77)

## 2013-01-14 LAB — CBC
Hemoglobin: 8.1 g/dL — ABNORMAL LOW (ref 12.0–15.0)
MCH: 28.2 pg (ref 26.0–34.0)
MCHC: 33.2 g/dL (ref 30.0–36.0)
Platelets: 176 10*3/uL (ref 150–400)
RDW: 16.3 % — ABNORMAL HIGH (ref 11.5–15.5)

## 2013-01-14 LAB — URINALYSIS, ROUTINE W REFLEX MICROSCOPIC
Glucose, UA: NEGATIVE mg/dL
Hgb urine dipstick: NEGATIVE
Ketones, ur: NEGATIVE mg/dL
Protein, ur: 30 mg/dL — AB

## 2013-01-14 LAB — COMPREHENSIVE METABOLIC PANEL
ALT: 81 U/L — ABNORMAL HIGH (ref 0–35)
AST: 86 U/L — ABNORMAL HIGH (ref 0–37)
Albumin: 2.8 g/dL — ABNORMAL LOW (ref 3.5–5.2)
Alkaline Phosphatase: 67 U/L (ref 39–117)
BUN: 92 mg/dL — ABNORMAL HIGH (ref 6–23)
Potassium: 4.7 mEq/L (ref 3.5–5.1)
Sodium: 146 mEq/L — ABNORMAL HIGH (ref 135–145)
Total Protein: 6.9 g/dL (ref 6.0–8.3)

## 2013-01-14 LAB — POCT I-STAT, CHEM 8
BUN: 90 mg/dL — ABNORMAL HIGH (ref 6–23)
Calcium, Ion: 1.18 mmol/L (ref 1.13–1.30)
Creatinine, Ser: 2.7 mg/dL — ABNORMAL HIGH (ref 0.50–1.10)
HCT: 22 % — ABNORMAL LOW (ref 36.0–46.0)
Potassium: 4.7 mEq/L (ref 3.5–5.1)
TCO2: 20 mmol/L (ref 0–100)

## 2013-01-14 LAB — URINE MICROSCOPIC-ADD ON

## 2013-01-14 LAB — RAPID URINE DRUG SCREEN, HOSP PERFORMED
Amphetamines: NOT DETECTED
Benzodiazepines: NOT DETECTED
Cocaine: NOT DETECTED
Opiates: NOT DETECTED

## 2013-01-14 LAB — PROTIME-INR
INR: 1.61 — ABNORMAL HIGH (ref 0.00–1.49)
Prothrombin Time: 18.7 seconds — ABNORMAL HIGH (ref 11.6–15.2)

## 2013-01-14 LAB — APTT: aPTT: 40 seconds — ABNORMAL HIGH (ref 24–37)

## 2013-01-14 LAB — TROPONIN I
Troponin I: <0.3 ng/mL (ref <0.30)
Troponin I: <0.3 ng/mL (ref <0.30)

## 2013-01-14 MED ORDER — SODIUM CHLORIDE 0.9 % IV SOLN
INTRAVENOUS | Status: DC
  Administered 2013-01-14: 22:00:00 via INTRAVENOUS

## 2013-01-14 MED ORDER — INSULIN ASPART 100 UNIT/ML ~~LOC~~ SOLN
0.0000 [IU] | Freq: Three times a day (TID) | SUBCUTANEOUS | Status: DC
  Administered 2013-01-15 – 2013-01-17 (×5): 2 [IU] via SUBCUTANEOUS
  Administered 2013-01-17 – 2013-01-18 (×4): 3 [IU] via SUBCUTANEOUS
  Administered 2013-01-18: 17:00:00 via SUBCUTANEOUS
  Administered 2013-01-19 (×2): 3 [IU] via SUBCUTANEOUS

## 2013-01-14 MED ORDER — ATORVASTATIN CALCIUM 40 MG PO TABS
40.0000 mg | ORAL_TABLET | Freq: Every day | ORAL | Status: DC
  Administered 2013-01-16 – 2013-01-19 (×3): 40 mg via ORAL
  Filled 2013-01-14 (×6): qty 1

## 2013-01-14 MED ORDER — FUROSEMIDE 10 MG/ML IJ SOLN
10.0000 mg | Freq: Every day | INTRAMUSCULAR | Status: DC
  Filled 2013-01-14: qty 1

## 2013-01-14 MED ORDER — VENLAFAXINE HCL ER 75 MG PO CP24
75.0000 mg | ORAL_CAPSULE | Freq: Every day | ORAL | Status: DC
  Filled 2013-01-14 (×2): qty 1

## 2013-01-14 MED ORDER — SENNOSIDES-DOCUSATE SODIUM 8.6-50 MG PO TABS: 1.0000 | ORAL_TABLET | Freq: Every evening | ORAL | Status: DC | PRN

## 2013-01-14 MED ORDER — SODIUM CHLORIDE 0.9 % IJ SOLN
10.0000 mL | INTRAMUSCULAR | Status: DC | PRN
  Administered 2013-01-14: 30 mL
  Administered 2013-01-15: 20 mL
  Administered 2013-01-15: 10 mL
  Administered 2013-01-16: 20 mL
  Administered 2013-01-16: 30 mL
  Administered 2013-01-17: 20 mL
  Administered 2013-01-18: 10 mL

## 2013-01-14 MED ORDER — INSULIN ASPART 100 UNIT/ML ~~LOC~~ SOLN: 0.0000 [IU] | Freq: Every day | SUBCUTANEOUS | Status: DC

## 2013-01-14 MED ORDER — RIVAROXABAN 15 MG PO TABS
15.0000 mg | ORAL_TABLET | Freq: Every day | ORAL | Status: DC
  Filled 2013-01-14: qty 1

## 2013-01-14 MED ORDER — LATANOPROST 0.005 % OP SOLN
1.0000 [drp] | Freq: Every day | OPHTHALMIC | Status: DC
  Administered 2013-01-14 – 2013-01-17 (×4): 1 [drp] via OPHTHALMIC
  Filled 2013-01-14: qty 2.5

## 2013-01-14 MED ORDER — METOPROLOL TARTRATE 1 MG/ML IV SOLN: 5.0000 mg | INTRAVENOUS | Status: DC | PRN

## 2013-01-14 MED ORDER — CIPROFLOXACIN IN D5W 400 MG/200ML IV SOLN
400.0000 mg | INTRAVENOUS | Status: DC
  Administered 2013-01-14 – 2013-01-16 (×3): 400 mg via INTRAVENOUS
  Filled 2013-01-14 (×4): qty 200

## 2013-01-14 MED ORDER — ASPIRIN-DIPYRIDAMOLE ER 25-200 MG PO CP12
1.0000 | ORAL_CAPSULE | Freq: Two times a day (BID) | ORAL | Status: DC
  Filled 2013-01-14 (×3): qty 1

## 2013-01-14 MED ORDER — RIVAROXABAN 20 MG PO TABS: 20.0000 mg | ORAL_TABLET | Freq: Every day | ORAL | Status: DC

## 2013-01-14 MED ORDER — DONEPEZIL HCL 10 MG PO TABS
10.0000 mg | ORAL_TABLET | Freq: Every evening | ORAL | Status: DC
  Administered 2013-01-15 – 2013-01-18 (×4): 10 mg via ORAL
  Filled 2013-01-14 (×6): qty 1

## 2013-01-14 MED ORDER — HEPARIN SODIUM (PORCINE) 5000 UNIT/ML IJ SOLN
5000.0000 [IU] | Freq: Three times a day (TID) | INTRAMUSCULAR | Status: DC
  Filled 2013-01-14 (×2): qty 1

## 2013-01-14 MED ORDER — SODIUM CHLORIDE 0.9 % IV BOLUS (SEPSIS)
500.0000 mL | Freq: Once | INTRAVENOUS | Status: AC
  Administered 2013-01-14: 500 mL via INTRAVENOUS

## 2013-01-14 NOTE — Telephone Encounter (Signed)
Emergency Line / After Hours Call  [facility] called to inform on call resident that patient was being taken to the ER after being found in an altered/acutely different state. VS showed hypotension, O2 sats in the 50's-60's, irregular heart rate, and exam showed cold extremities. Of note patient is full code. Agree with taking patient to ER. [facility] staff working to get in touch with pt's family. Will await consultation from ER physicians for admission, if that is deemed the appropriate course.  Levert Feinstein, MD Family Medicine PGY-2

## 2013-01-14 NOTE — ED Provider Notes (Signed)
CSN: [id number]     Arrival date & time 01/14/13  1431 History   First MD Initiated Contact with Patient 01/14/13 1444     Chief Complaint  Patient presents with  . Code Stroke    HPI  Patient resides at an extended care facility with history of previous strokes. Apparently was eating lunch today the dining hall. Had a bite of food in her mouth became suddenly less responsive. Did not describe aspiration or choking or coughing. However she was nonresponsive although fairly still sitting upright did not have loss of postural tone or syncope. She is aphasic at baseline with a history of a previous stroke.  Transferred by EMS with decreased light level of consciousness. Glucose 200.   Past Medical History  Diagnosis Date  . Left hemiplegia   . Aphasia   . Muscle weakness   . Diabetes mellitus   . Malaise   . Fatigue   . Breast neoplasm   . Hypertension   . Glaucoma   . UTI (lower urinary tract infection)   . Vitamin D deficiency   . Hiatal hernia   . Constipation   . Diarrhea   . Asthma   . Obesity   . Hyperlipidemia   . Alzheimer's dementia   . Depression   . Allergic rhinitis   . Dementia   . Stroke     infarct    No past surgical history on file. No family history on file. History  Substance Use Topics  . Smoking status: Never Smoker   . Smokeless tobacco: Not on file  . Alcohol Use: No   OB History   Grav Para Term Preterm Abortions TAB SAB Ect Mult Living                 Review of Systems  Unable to perform ROS: Mental status change      Allergies  Penicillins; Tetanus toxoids; and Vicodin  Home Medications   Current Outpatient Rx  Name  Route  Sig  Dispense  Refill  . acetaminophen (TYLENOL) 650 MG CR tablet   Oral   Take 650 mg by mouth every 12 (twelve) hours as needed for pain or fever.         Marland Kitchen atorvastatin (LIPITOR) 40 MG tablet   Oral   Take 40 mg by mouth daily.         . calcium citrate (CALCITRATE - DOSED IN MG ELEMENTAL CALCIUM)  950 MG tablet   Oral   Take 1 tablet by mouth daily.         . cloNIDine (CATAPRES - DOSED IN MG/24 HR) 0.1 mg/24hr patch   Transdermal   Place 1 patch onto the skin once a week. APPLY NEW PATCH ON SATURDAY         . Cranberry-Vitamin C-Inulin (UTI-STAT) LIQD   Oral   Take 30 mLs by mouth daily.         Marland Kitchen dipyridamole-aspirin (AGGRENOX) 200-25 MG per 12 hr capsule   Oral   Take 1 capsule by mouth 2 (two) times daily.         Marland Kitchen donepezil (ARICEPT) 10 MG tablet   Oral   Take 10 mg by mouth every evening.         . fluticasone (FLONASE) 50 MCG/ACT nasal spray   Nasal   Place 2 sprays into the nose daily.         . Folic Acid-Vit B6-Vit B12 (FOLBEE) 2.5-25-1 MG TABS  Oral   Take 1 tablet by mouth daily.         . furosemide (LASIX) 20 MG tablet   Oral   Take 20 mg by mouth daily.         Marland Kitchen lisinopril (ZESTRIL) 2.5 MG tablet   Oral   Take 1 tablet (2.5 mg total) by mouth daily.         Marland Kitchen loratadine (CLARITIN) 10 MG tablet   Oral   Take 1 tablet (10 mg total) by mouth daily.   30 tablet   11   . metoprolol succinate (TOPROL-XL) 25 MG 24 hr tablet   Oral   Take 1 tablet (25 mg total) by mouth daily.         . polyethylene glycol powder (GLYCOLAX/MIRALAX) powder   Oral   Take 17 g by mouth daily as needed. FOR CONSTIPATION         . promethazine (PHENERGAN) 25 MG tablet   Oral   Take 25 mg by mouth every 6 (six) hours as needed for nausea.         . ranitidine (ZANTAC) 150 MG tablet   Oral   Take 150 mg by mouth every evening.         . Rivaroxaban (XARELTO) 20 MG TABS tablet   Oral   Take 1 tablet (20 mg total) by mouth daily.   30 tablet      . Travoprost, BAK Free, (TRAVATAN) 0.004 % SOLN ophthalmic solution   Both Eyes   Place 1 drop into both eyes at bedtime.         Marland Kitchen venlafaxine XR (EFFEXOR-XR) 75 MG 24 hr capsule   Oral   Take 75 mg by mouth daily.         . Vitamin D, Ergocalciferol, (DRISDOL) 50000 UNITS CAPS    Oral   Take 50,000 Units by mouth every 30 (thirty) days. Give q 15th day of each month          BP 101/73  Pulse 93  Resp 37  SpO2 100% Physical Exam  Constitutional: She is oriented to person, place, and time. She appears listless. No distress.  HENT:  Head: Normocephalic.  Atraumatic  Eyes: Conjunctivae are normal. Pupils are equal, round, and reactive to light. No scleral icterus.  Neck: Normal range of motion. Neck supple. No thyromegaly present.  Cardiovascular: Normal rate and regular rhythm.  Exam reveals no gallop and no friction rub.   No murmur heard. Regular rhythm. Sinus rhythm first degree AV block the monitor.  Pulmonary/Chest: Effort normal and breath sounds normal. No respiratory distress. She has no wheezes. She has no rales.  Clear lungs. No crackles.  Abdominal: Soft. Bowel sounds are normal. She exhibits no distension. There is no tenderness. There is no rebound.  Musculoskeletal: Normal range of motion.  Neurological: She is oriented to person, place, and time. She appears listless.  Her eyes will open response to voice or stimulus. She is nonverbal. She'll not follow commands. She will withdraw all of her extremities. She is not posturing.  Skin: Skin is warm and dry. No rash noted.  Psychiatric: She has a normal mood and affect. Her behavior is normal.    ED Course  CENTRAL LINE Date/Time: 01/14/2013 5:00 PM Performed by: Roney Marion Authorized by: Rolland [name] J Consent: Verbal consent not obtained. written consent not obtained. The procedure was performed in an emergent situation. Patient understanding: patient does not state understanding of the  procedure being performed Patient identity confirmed: arm band Indications: vascular access Local anesthetic: lidocaine 1% without epinephrine Anesthetic total: 3 ml Patient sedated: no Preparation: skin prepped with 2% chlorhexidine Skin prep agent dried: skin prep agent completely dried prior to  procedure Sterile barriers: all five maximum sterile barriers used - cap, mask, sterile gown, sterile gloves, and large sterile sheet Hand hygiene: hand hygiene performed prior to central venous catheter insertion Location details: right femoral Site selection rationale: Safety Catheter type: triple lumen Catheter size: 7 Fr Ultrasound guidance: yes Number of attempts: 1 Successful placement: yes Post-procedure: line sutured Assessment: blood return through all ports Patient tolerance: Patient tolerated the procedure well with no immediate complications.   (including critical care time) Labs Review Labs Reviewed  PROTIME-INR - Abnormal; Notable for the following:    Prothrombin Time 18.7 (*)    INR 1.61 (*)    All other components within normal limits  APTT - Abnormal; Notable for the following:    aPTT 40 (*)    All other components within normal limits  CBC - Abnormal; Notable for the following:    RBC 2.87 (*)    Hemoglobin 8.1 (*)    HCT 24.4 (*)    RDW 16.3 (*)    All other components within normal limits  DIFFERENTIAL - Abnormal; Notable for the following:    Neutrophils Relative % 79 (*)    Neutro Abs 8.3 (*)    All other components within normal limits  COMPREHENSIVE METABOLIC PANEL - Abnormal; Notable for the following:    Sodium 146 (*)    CO2 18 (*)    Glucose, Bld 239 (*)    BUN 92 (*)    Creatinine, Ser 2.45 (*)    Albumin 2.8 (*)    AST 86 (*)    ALT 81 (*)    GFR calc non Af Amer 18 (*)    GFR calc Af Amer 21 (*)    All other components within normal limits  GLUCOSE, CAPILLARY - Abnormal; Notable for the following:    Glucose-Capillary 182 (*)    All other components within normal limits  POCT I-STAT, CHEM 8 - Abnormal; Notable for the following:    Sodium 149 (*)    Chloride 115 (*)    BUN 90 (*)    Creatinine, Ser 2.70 (*)    Glucose, Bld 230 (*)    Hemoglobin 7.5 (*)    HCT 22.0 (*)    All other components within normal limits  POCT I-STAT  TROPONIN I - Abnormal; Notable for the following:    Troponin i, poc 0.15 (*)    All other components within normal limits  ETHANOL  TROPONIN I  URINE RAPID DRUG SCREEN (HOSP PERFORMED)  URINALYSIS, ROUTINE W REFLEX MICROSCOPIC   Imaging Review Ct Head Wo Contrast  01/14/2013   CLINICAL DATA:  Code stroke. Altered level of consciousness. Dementia. Hypertension.  EXAM: CT HEAD WITHOUT CONTRAST  TECHNIQUE: Contiguous axial images were obtained from the base of the skull through the vertex without contrast.  COMPARISON:  CT head and MR head from September 2013.  FINDINGS: Advanced cerebral and cerebellar atrophy. Widespread chronic microvascular ischemic change. Remote left parietal and right frontal infarcts. No visible acute intracranial ischemic change. No hemorrhage, mass lesion, or extra-axial fluid. No CT signs of proximal vascular thrombosis. Calvarium intact. Clear sinuses and mastoids. Unchanged appearance from priors.  IMPRESSION: Stable findings of severe atrophy, chronic microvascular ischemic change, and remote large vessel  infarct. No acute intracranial findings are evident.  Critical Value/emergent results were called by telephone at the time of interpretation on 01/14/2013 at 2:52 PM to Dr. Amada Jupiter, who verbally acknowledged these results.   Electronically Signed   By: Davonna Belling M.D.   On: 01/14/2013 14:54    EKG Interpretation     Ventricular Rate:  94 PR Interval:  206 QRS Duration: 81 QT Interval:  349 QTC Calculation: 436 R Axis:   -15 Text Interpretation:  SR c First degree AVB  Abnormal R-wave progression, early transition Consider left ventricular hypertrophy            MDM   1. Encephalopathy acute   2. CVA (cerebral infarction)    Patient was made a "code stroke". Seen and evaluated by neurology Dr. Amada Jupiter, and myself upon her arrival. Her CT does not show acute abnormalities. She has a preferred rightward gaze, and is aphasic.  She has prior left  parietal, and right frontal infarct. No acute abnormalities. Her a seizure and rightward gaze were present at her most recent discharge. However she has been able to feed herself and occasionally speak a few words at her care facility. The decreased level of consciousness is new from today. This may represent new cortical infarct. She is not a candidate for acute lytic therapy because of her previous episodes. She is azotemic, with creatinine elevated at 2.9, versus her baseline of 0.9 Neurology has recommended MRI. She has not shown improved  her mental status to the point that she would be able to feed or hydrate herself now. Placed a call to hospitalist regarding her admission.  Her troponin is slightly elevated at 0.15. I do not see acute EKG changes. This may be a result of her azotemia Her EKG shows sinus rhythm with a first degree AV block. She has slow R-wave transition.  There is no morphologic change from her most recent EKG.    Roney Marion, MD 01/23/13 [phone]

## 2013-01-14 NOTE — Consult Note (Signed)
Neurology Consultation Reason for Consult: Altered mental status Referring Physician: Rolland [name]  CC: Altered mental status  History is obtained from: Daughter, EMS  HPI: [name] is a 78 y.o. female who was in her normal compromised state which consists of dementia, weakness with some spasticity of bilateral arms, aphasia (able to state some words, and typically able to follow commands) until around noon today. At that time, she was eating and slump forward and stopped being as responsive.   LKW: noon tpa given: no, on xarelto    ROS: A 14 point ROS was performed and is negative except as noted in the HPI.  Past Medical History  Diagnosis Date  . Left hemiplegia   . Aphasia   . Muscle weakness   . Diabetes mellitus   . Malaise   . Fatigue   . Breast neoplasm   . Hypertension   . Glaucoma   . UTI (lower urinary tract infection)   . Vitamin D deficiency   . Hiatal hernia   . Constipation   . Diarrhea   . Asthma   . Obesity   . Hyperlipidemia   . Alzheimer's dementia   . Depression   . Allergic rhinitis   . Dementia   . Stroke     infarct     Family History: Unable to assess secondary to patient's altered mental status.    Social History: Tob: Unable to assess secondary to patient's altered mental status.    Exam: Current vital signs: BP 94/64  Pulse 85  Temp(Src) 99.3 F (37.4 C) (Oral)  Resp 19  SpO2 99% Vital signs in last 24 hours: Temp:  [99.3 F (37.4 C)] 99.3 F (37.4 C) (11/01 1758) Pulse Rate:  [83-94] 85 (11/01 1923) Resp:  [19-38] 19 (11/01 1923) BP: (82-115)/(24-86) 94/64 mmHg (11/01 1923) SpO2:  [90 %-100 %] 99 % (11/01 1923)  General: In bed CV: Regular in rhythm Mental Status: Patient is awake, but does not follow commands. She does not make any attempts to speak. She does fixate and track the examiner. Cranial Nerves: II: Visual Fields are full. Pupils are equal, round, and reactive to light.  Discs are difficult to  visualize. III,IV, VI: She is not clearly seen to move past midline to the left V: Facial sensation is responds to stimulation bilaterally VII: Facial movement is symmetric though she does not grimace much VIII, X, XI, XII: Unable to assess secondary to patient's altered mental status.  Motor: She has increased tone in bilateral arms, but does have voluntary movements of both sides. She does grip my hands at times, but not to command. She wiggles toes bilaterally, and withdrawals some to noxious stimuli Sensory: Response to noxious stimuli by 4 Deep Tendon Reflexes: 2+ and symmetric in the biceps and ankles Cerebellar: Unable to assess secondary to patient's altered mental status.  Gait: Unable to assess secondary to patient's altered mental status.   I have reviewed labs in epic and the results pertinent to this consultation are: Elevated creatinine, possible UTI  I have reviewed the images obtained: CT head-no acute finding  Impression: 78 year old female with a history of dementia as well as multiple strokes with aphasia and bilateral weakness who presents with altered mental status of unclear etiology. The relative acuity could suggest a small infarct causing a global aphasia, but I do not feel that this is definite. An MRI could be helpful to determine whether or not she has had a new infarct.   Recommendations:  1) MRI brain 2) agree with treating UTI and kidney failure 3) further recommendations following MR brain   Ritta Slot, MD [facility] [phone]  If 7pm- 7am, please page neurology on call at [phone].

## 2013-01-14 NOTE — H&P (Signed)
[facility] History and Physical  [name] XLK:[id number] DOB: [birth year] DOA: 01/14/2013  Referring physician: ED physician PCP: Tana Conch, MD   Chief Complaint: Altered Mental Status  HPI:  Ms. Medders is a 78yo woman with PMH of breast neoplasm with lumpectomy X 2, hyperlipidemia, vascular dementia, CKD stage 2, DM, HTN, sacral ulcers (per report, from prior to admission), previous AMS (September admission from this year attributed to a UTI) and previous strokes with AMS from 2008 who presents for new AMS.  Per report (2 daughters at bedside, but did not witness event) while eating a meal today at [facility], Ms. Solorzano suddenly stopped chewing slumped forward and became less responsive.  She was transferred to the [facility] in concern for a new stroke.  Per her daughter, she had a similar episode a few days ago, but became responsive very quickly after being prompted.  Her symptoms prior to this acute event were some residual right hemiplegia and aphasia, however, she was able to speak some with prompting and move about with assistance.  When I spoke with her and the family, she was nonresponsive mostly to verbal stimuli, but did move extremities when asked.  Per her daughter, she had similar symptoms about a year ago due to a UTI and she improved to baseline.  She would like her urine checked.  In the ED, she has had a CT of her head with no acute changes, only chronic.  She further was found to be in acute renal failure with baseline Cr of 0.7-0.9 and rise to 2.45 -> 2.7.  Her urine has been sent to evaluate for infection.  UA was cloudy with minimal protein and small leukocytes and many bacteria.  She had an E.coli UTI previously which was sensitive to Cipro.   Assessment and Plan: Principal Problem: Stroke - Neurology is following the patient - Have not seen official note from them yet, but will plan for the following - Bedside speech study, if fails will need SLP evaluation  and change meds to IV as needed - MRI/MRA brain - TTE - CD - Statin, check A1C, TSH, Lipid profile - Follow up Neurology recommendations  Active Problems: Hyperlipidemia - On a statin, will check FLP and continue if able to swallow  UTI (lower urinary tract infection) - Urinalysis suggestive, UC pending - Will start Ciprofloxacin in the interim as patient allergic to PCN  Acute on chronic kidney failure - Low rate of IVF overnight, given her AMS and TTE with no LV dysfunction, but inability to tell about diastolic dysfunction, will give only slow fluids so as not to volume overload the patinet - NS at 100cc/hr overnight.  - BMET in the AM  CKD (chronic kidney disease) stage 2, GFR 60-89 ml/min - Acutely worsened, see above  Vascular dementia - Continue medications for dementia if able to swallow - Consider exelon patch change if not  Hypertension - Pressure low on admission, no mildly low, possibly consistent with infection.  - Will hold PO medications in the setting of concern for stroke and give IV metoprolol if needed for SBP > 180  DM - Family reports h/o DM, will start SSI  Pressure ulcers - Present on admission, will consult wound care  Anemia - MCV normal, likely chronic disease, will check iron panel  Code Status: Full, confirmed with family Family Communication: Patient's 2 daughters at bedside Disposition Plan: Pending work up   Review of Systems:  Unable to be reliably obtained due  to patient factors, she is currently aphasic and family could not accurately answer questions.  No documentation from nursing home available.     Past Medical History  Diagnosis Date  . Left hemiplegia   . Aphasia   . Muscle weakness   . Diabetes mellitus   . Malaise   . Fatigue   . Breast neoplasm   . Hypertension   . Glaucoma   . UTI (lower urinary tract infection)   . Vitamin D deficiency   . Hiatal hernia   . Constipation   . Diarrhea   . Asthma   . Obesity    . Hyperlipidemia   . Alzheimer's dementia   . Depression   . Allergic rhinitis   . Dementia   . Stroke     infarct     No past surgical history on file.  Social History:  reports that she has never smoked. She does not have any smokeless tobacco history on file. She reports that she does not drink alcohol or use illicit drugs.  Allergies  Allergen Reactions  . Penicillins Other (See Comments)    Per MAR  . Tetanus Toxoids Other (See Comments)    Per MAR  . Vicodin [Hydrocodone-Acetaminophen] Other (See Comments)    Per MAR    No family history on file.  Confirmed what history I could with the family.    Prior to Admission medications   Medication Sig Start Date End Date Taking? Authorizing Provider  acetaminophen (TYLENOL) 650 MG CR tablet Take 650 mg by mouth every 12 (twelve) hours as needed for pain or fever. 04/02/12   Shelly Rubenstein, MD  atorvastatin (LIPITOR) 40 MG tablet Take 40 mg by mouth daily.    Historical Provider, MD  calcium citrate (CALCITRATE - DOSED IN MG ELEMENTAL CALCIUM) 950 MG tablet Take 1 tablet by mouth daily.    Historical Provider, MD  cloNIDine (CATAPRES - DOSED IN MG/24 HR) 0.1 mg/24hr patch Place 1 patch onto the skin once a week. APPLY NEW PATCH ON SATURDAY    Historical Provider, MD  Cranberry-Vitamin C-Inulin (UTI-STAT) LIQD Take 30 mLs by mouth daily.    Historical Provider, MD  dipyridamole-aspirin (AGGRENOX) 200-25 MG per 12 hr capsule Take 1 capsule by mouth 2 (two) times daily.    Historical Provider, MD  donepezil (ARICEPT) 10 MG tablet Take 10 mg by mouth every evening.    Historical Provider, MD  fluticasone (FLONASE) 50 MCG/ACT nasal spray Place 2 sprays into the nose daily.    Historical Provider, MD  Folic Acid-Vit B6-Vit B12 (FOLBEE) 2.5-25-1 MG TABS Take 1 tablet by mouth daily.    Historical Provider, MD  furosemide (LASIX) 20 MG tablet Take 20 mg by mouth daily.    Historical Provider, MD  lisinopril (ZESTRIL) 2.5 MG tablet  Take 1 tablet (2.5 mg total) by mouth daily. 02/08/12   Zachery Dauer, MD  loratadine (CLARITIN) 10 MG tablet Take 1 tablet (10 mg total) by mouth daily. 12/19/12   Amber Nydia Bouton, MD  metoprolol succinate (TOPROL-XL) 25 MG 24 hr tablet Take 1 tablet (25 mg total) by mouth daily. 02/08/12   Zachery Dauer, MD  polyethylene glycol powder (GLYCOLAX/MIRALAX) powder Take 17 g by mouth daily as needed. FOR CONSTIPATION    Historical Provider, MD  promethazine (PHENERGAN) 25 MG tablet Take 25 mg by mouth every 6 (six) hours as needed for nausea.    Historical Provider, MD  ranitidine (ZANTAC) 150 MG tablet Take 150  mg by mouth every evening.    Historical Provider, MD  Rivaroxaban (XARELTO) 20 MG TABS tablet Take 1 tablet (20 mg total) by mouth daily. 01/07/13   Amber Nydia Bouton, MD  Travoprost, BAK Free, (TRAVATAN) 0.004 % SOLN ophthalmic solution Place 1 drop into both eyes at bedtime.    Historical Provider, MD  venlafaxine XR (EFFEXOR-XR) 75 MG 24 hr capsule Take 75 mg by mouth daily.    Historical Provider, MD  Vitamin D, Ergocalciferol, (DRISDOL) 50000 UNITS CAPS Take 50,000 Units by mouth every 30 (thirty) days. Give q 15th day of each month 04/13/12   Lora Paula, MD    Physical Exam: Filed Vitals:   01/14/13 1730 01/14/13 1745 01/14/13 1758 01/14/13 1800  BP: 104/80 114/86 114/86 109/71  Pulse:   89   Temp:   99.3 F (37.4 C)   TempSrc:   Oral   Resp: 38 30 31 25   SpO2:   100%     Physical Exam  Constitutional: Alert with eyes open, appears well developed and nourished HENT: Normocephalic. OP with food bolus and food in teetch Eyes: Conjunctivae.  Rightward gaze preference. PERRLA, no scleral icterus.  Neck: Neck supple. No tracheal deviation. No thyromegaly.  CVS: RR, NR, , no murmurs noted Pulmonary: Effort and breath sounds normal anteriorly,  wheezes, rales.  Abdominal: Soft. BS +,  no distension, tenderness Musculoskeletal: No edema and no tenderness.  Lymphadenopathy: No  lymphadenopathy noted, cervical. Neuro: Alert. She squeezed my hands, moved her feet to my command but otherwise did not follow commands.  She has a rightward gaze preference.  She is non verbal.  She is not posturing.   Skin: Skin is warm and dry. Per report, sacral wounds present .   Labs on Admission:  Basic Metabolic Panel:  Recent Labs Lab 01/14/13 1446 01/14/13 1453  NA 146* 149*  K 4.7 4.7  CL 111 115*  CO2 18*  --   GLUCOSE 239* 230*  BUN 92* 90*  CREATININE 2.45* 2.70*  CALCIUM 9.1  --    Liver Function Tests:  Recent Labs Lab 01/14/13 1446  AST 86*  ALT 81*  ALKPHOS 67  BILITOT 0.8  PROT 6.9  ALBUMIN 2.8*   CBC:  Recent Labs Lab 01/14/13 1446 01/14/13 1453  WBC 10.5  --   NEUTROABS 8.3*  --   HGB 8.1* 7.5*  HCT 24.4* 22.0*  MCV 85.0  --   PLT 176  --    Cardiac Enzymes:  Recent Labs Lab 01/14/13 1446  TROPONINI <0.30   CBG:  Recent Labs Lab 01/14/13 1508  GLUCAP 182*    Radiological Exams on Admission: Ct Head Wo Contrast  01/14/2013   CLINICAL DATA:  Code stroke. Altered level of consciousness. Dementia. Hypertension.  EXAM: CT HEAD WITHOUT CONTRAST  TECHNIQUE: Contiguous axial images were obtained from the base of the skull through the vertex without contrast.  COMPARISON:  CT head and MR head from September 2013.  FINDINGS: Advanced cerebral and cerebellar atrophy. Widespread chronic microvascular ischemic change. Remote left parietal and right frontal infarcts. No visible acute intracranial ischemic change. No hemorrhage, mass lesion, or extra-axial fluid. No CT signs of proximal vascular thrombosis. Calvarium intact. Clear sinuses and mastoids. Unchanged appearance from priors.  IMPRESSION: Stable findings of severe atrophy, chronic microvascular ischemic change, and remote large vessel infarct. No acute intracranial findings are evident.  Critical Value/emergent results were called by telephone at the time of interpretation on 01/14/2013  at 2:52  PM to Dr. Amada Jupiter, who verbally acknowledged these results.   Electronically Signed   By: Davonna Belling M.D.   On: 01/14/2013 14:54    EKG: Normal sinus rhythm, first degree AV block (PR interval 206), consider LV hypertrophy  Debe Coder, MD  [facility] Pager [phone]  If 7PM-7AM, please contact night-coverage [email or web address] Password [facility] 01/14/2013, 6:32 PM

## 2013-01-14 NOTE — Progress Notes (Signed)
ANTIBIOTIC CONSULT NOTE - INITIAL  Pharmacy Consult for Cipro Indication: UTI  Allergies  Allergen Reactions  . Penicillins Other (See Comments)    Per MAR  . Tetanus Toxoids Other (See Comments)    Per MAR  . Vicodin [Hydrocodone-Acetaminophen] Other (See Comments)    Per [facility]    Patient Measurements: Height: 5\' 6"  (167.6 cm) Weight: 213 lb 8 oz (96.843 kg) IBW/kg (Calculated) : 59.3  Vital Signs: Temp: 98.6 F (37 C) (11/01 2103) Temp src: Axillary (11/01 2103) BP: 86/40 mmHg (11/01 2103) Pulse Rate: 80 (11/01 2103) Labs:  Recent Labs  01/14/13 1446 01/14/13 1453  WBC 10.5  --   HGB 8.1* 7.5*  PLT 176  --   CREATININE 2.45* 2.70*   Medical History: Past Medical History  Diagnosis Date  . Left hemiplegia   . Aphasia   . Muscle weakness   . Diabetes mellitus   . Malaise   . Fatigue   . Breast neoplasm   . Hypertension   . Glaucoma   . UTI (lower urinary tract infection)   . Vitamin D deficiency   . Hiatal hernia   . Constipation   . Diarrhea   . Asthma   . Obesity   . Hyperlipidemia   . Alzheimer's dementia   . Depression   . Allergic rhinitis   . Dementia   . Stroke     infarct    Assessment: 78 y/o F here with AMS, abnormal UA to start Cipro per pharmacy. Noted Scr of 2.70 with CrCl <30. Getting urine culture.   Goal of Therapy:  Clinical resolution  Plan:  -Ciprofloxacin 400 mg IV q24h -Trend WBC, temp, renal function -F/U urine cx  Thank you for allowing me to take part in this patient's care,  Abran Duke, PharmD Clinical Pharmacist Phone: [phone] Pager: [phone] 01/14/2013 10:22 PM

## 2013-01-14 NOTE — ED Notes (Signed)
EDP at bedside to insert central line.

## 2013-01-14 NOTE — ED Notes (Signed)
2 RNs attempted to obtain IV access. Both unsuccessful IV team at bedside.

## 2013-01-14 NOTE — ED Notes (Addendum)
Pt reports to the ED for eval of sudden onset altered mental status. Pt was eating dinner at the dining hall when caretakers noted pt was no longer responding or following commands. Pt has hx of CVA with left sided deficits and aphasia. Pt responsive only to physical stimuli. Pt non-verbal and unable to follow commands. CBG 208 mg/dl in EMS. Pt takes Xeralto. Skin cool and clammy. Resp e/u. Pt has pocketed food in mouth.

## 2013-01-15 DIAGNOSIS — N182 Chronic kidney disease, stage 2 (mild): Secondary | ICD-10-CM

## 2013-01-15 DIAGNOSIS — D649 Anemia, unspecified: Secondary | ICD-10-CM

## 2013-01-15 DIAGNOSIS — I359 Nonrheumatic aortic valve disorder, unspecified: Secondary | ICD-10-CM

## 2013-01-15 DIAGNOSIS — G9340 Encephalopathy, unspecified: Secondary | ICD-10-CM

## 2013-01-15 DIAGNOSIS — R7989 Other specified abnormal findings of blood chemistry: Secondary | ICD-10-CM

## 2013-01-15 LAB — CBC
HCT: 16.3 % — ABNORMAL LOW (ref 36.0–46.0)
HCT: 22.5 % — ABNORMAL LOW (ref 36.0–46.0)
Hemoglobin: 5.5 g/dL — CL (ref 12.0–15.0)
MCH: 28.6 pg (ref 26.0–34.0)
MCHC: 33.1 g/dL (ref 30.0–36.0)
MCHC: 33.3 g/dL (ref 30.0–36.0)
MCHC: 34.7 g/dL (ref 30.0–36.0)
MCV: 82.7 fL (ref 78.0–100.0)
MCV: 83.3 fL (ref 78.0–100.0)
Platelets: 159 10*3/uL (ref 150–400)
Platelets: 162 10*3/uL (ref 150–400)
RDW: 16.5 % — ABNORMAL HIGH (ref 11.5–15.5)
RDW: 16.4 % — ABNORMAL HIGH (ref 11.5–15.5)
RDW: 15.1 % (ref 11.5–15.5)
WBC: 9.3 10*3/uL (ref 4.0–10.5)
WBC: 9.2 10*3/uL (ref 4.0–10.5)

## 2013-01-15 LAB — GLUCOSE, CAPILLARY
Glucose-Capillary: 140 mg/dL — ABNORMAL HIGH (ref 70–99)
Glucose-Capillary: 130 mg/dL — ABNORMAL HIGH (ref 70–99)
Glucose-Capillary: 133 mg/dL — ABNORMAL HIGH (ref 70–99)
Glucose-Capillary: 141 mg/dL — ABNORMAL HIGH (ref 70–99)
Glucose-Capillary: 157 mg/dL — ABNORMAL HIGH (ref 70–99)

## 2013-01-15 LAB — BASIC METABOLIC PANEL
BUN: 96 mg/dL — ABNORMAL HIGH (ref 6–23)
CO2: 21 mEq/L (ref 19–32)
Calcium: 8.1 mg/dL — ABNORMAL LOW (ref 8.4–10.5)
Chloride: 114 mEq/L — ABNORMAL HIGH (ref 96–112)
Creatinine, Ser: 2.36 mg/dL — ABNORMAL HIGH (ref 0.50–1.10)
GFR calc Af Amer: 22 mL/min — ABNORMAL LOW (ref >90)
GFR calc non Af Amer: 19 mL/min — ABNORMAL LOW (ref >90)
Glucose, Bld: 151 mg/dL — ABNORMAL HIGH (ref 70–99)
Potassium: 3.8 mEq/L (ref 3.5–5.1)

## 2013-01-15 LAB — LIPID PANEL
Cholesterol: 95 mg/dL (ref 0–200)
Triglycerides: 101 mg/dL (ref <150)

## 2013-01-15 LAB — TSH: TSH: 1.007 u[IU]/mL (ref 0.350–4.500)

## 2013-01-15 LAB — TROPONIN I
Troponin I: <0.3 ng/mL
Troponin I: <0.3 ng/mL (ref <0.30)

## 2013-01-15 LAB — ABO/RH: ABO/RH(D): B POS

## 2013-01-15 LAB — PREPARE RBC (CROSSMATCH)

## 2013-01-15 LAB — HEMOGLOBIN A1C
Hgb A1c MFr Bld: 6.5 % — ABNORMAL HIGH
Mean Plasma Glucose: 140 mg/dL — ABNORMAL HIGH

## 2013-01-15 MED ORDER — BIOTENE DRY MOUTH MT LIQD
15.0000 mL | Freq: Two times a day (BID) | OROMUCOSAL | Status: DC
  Administered 2013-01-15 – 2013-01-19 (×10): 15 mL via OROMUCOSAL

## 2013-01-15 MED ORDER — SODIUM CHLORIDE 0.9 % IV SOLN
Freq: Once | INTRAVENOUS | Status: AC
  Administered 2013-01-15: 01:00:00 via INTRAVENOUS

## 2013-01-15 MED ORDER — SODIUM CHLORIDE 0.45 % IV SOLN
INTRAVENOUS | Status: AC
  Administered 2013-01-15: 08:00:00 via INTRAVENOUS

## 2013-01-15 MED ORDER — SODIUM CHLORIDE 0.9 % IV BOLUS (SEPSIS)
250.0000 mL | Freq: Once | INTRAVENOUS | Status: AC
  Administered 2013-01-15: 250 mL via INTRAVENOUS

## 2013-01-15 MED ORDER — VENLAFAXINE HCL ER 37.5 MG PO CP24
37.5000 mg | ORAL_CAPSULE | Freq: Every day | ORAL | Status: DC
  Administered 2013-01-16 – 2013-01-19 (×3): 37.5 mg via ORAL
  Filled 2013-01-15 (×6): qty 1

## 2013-01-15 NOTE — Progress Notes (Signed)
PT Cancellation Note  Patient Details Name: [name] MRN: [id number] DOB: [birth year]   Cancelled Treatment:    Reason Eval/Treat Not Completed: Medical issues which prohibited therapy.  Patient with Hgb of 5.4 this am - receiving transfusions.  Patient with min responsiveness.  Will hold PT today.  Will return tomorrow for PT evaluation.   Vena Austria 01/15/2013, 1:07 PM Durenda Hurt. Renaldo Fiddler, [facility] Pager [phone]

## 2013-01-15 NOTE — Progress Notes (Signed)
Patient was admitted on 01/14/13 by the Hospitalist service. On review of patient's chart, her PCP is Dr. Tana Conch with the [facility] Medicine teaching service. Discussed with Dr. Durene Cal and transferred this patient's care over to his service and will be seen today by their team. Hospitalist service will sign off. Please contact for any further assistance.  Marcellus Scott, MD, FACP, FHM. [facility] Pager [phone]  If 7PM-7AM, please contact night-coverage [email or web address] Password [email or web address] 01/15/2013, 11:19 AM

## 2013-01-15 NOTE — Progress Notes (Signed)
CRITICAL VALUE ALERT  Critical value received:  Hemoglobin 5.4  Date of notification:  01/15/13  Time of notification:  0447  Critical value read back:yes  Nurse who received alert: Salvadore Oxford, RN    MD notified (1st page): Elray Mcgregor, NP  Time of first page:  0448  MD notified (2nd page):  Time of second page:  Responding MD: Elray Mcgregor, NP  Time MD responded:  [phone]

## 2013-01-15 NOTE — Evaluation (Signed)
Clinical/Bedside Swallow Evaluation Patient Details  Name: [name] MRN: [id number] Date of Birth: 09/02/1934  Today's Date: 01/15/2013 Time: 1500-1530 SLP Time Calculation (min): 30 min  Past Medical History:  Past Medical History  Diagnosis Date  . Left hemiplegia   . Aphasia   . Muscle weakness   . Diabetes mellitus   . Malaise   . Fatigue   . Breast neoplasm   . Hypertension   . Glaucoma   . UTI (lower urinary tract infection)   . Vitamin D deficiency   . Hiatal hernia   . Constipation   . Diarrhea   . Asthma   . Obesity   . Hyperlipidemia   . Alzheimer's dementia   . Depression   . Allergic rhinitis   . Dementia   . Stroke     infarct    Past Surgical History:  Past Surgical History  Procedure Laterality Date  . Joint replacement      left knee in '90s  . Breast surgery      lumpectomy x2   HPI:  [name] is a 78yo woman with PMH of breast neoplasm with lumpectomy X 2, hyperlipidemia, vascular dementia, CKD stage 2, DM, HTN, sacral ulcers (per report, from prior to admission), previous AMS (September admission from this year attributed to a UTI) and previous strokes with AMS from 2008 who presents for new AMS.  Per report (2 daughters at bedside, but did not witness event) while eating a meal today at [facility], [name] suddenly stopped chewing slumped forward and became less responsive.  She was transferred to the [facility] in concern for a new stroke.  Per her daughter, she had a similar episode a few days ago, but became responsive very quickly after being prompted.  Her symptoms prior to this acute event were some residual right hemiplegia and aphasia, however, she was able to speak some with prompting and move about with assistance.  When I spoke with her and the family, she was nonresponsive mostly to verbal stimuli, but did move extremities when asked.  Per her daughter, she had similar symptoms about a year ago due to a UTI and she improved to  baseline.  She would like her urine checked.  In the ED, she has had a CT of her head with no acute changes, only chronic.  She further was found to be in acute renal failure with baseline Cr of 0.7-0.9 and rise to 2.45 -> 2.7.  Her urine has been sent to evaluate for infection.  UA was cloudy with minimal protein and small leukocytes and many bacteria.  She had an E.coli UTI previously which was sensitive to Cipro.   BSE ordered per Stroke Protocol.     Assessment / Plan / Recommendation Clinical Impression  BSE completed.  Presents with lethargy initially, but easily aroused for brief moments with verbal and tactile cues.   Moderate oral dysphagia present with suspected minimal pharyngeal dysphagia. No outward clinical s/s of aspiration noted throughout evaluation but PO's limited to thin and NTL and puree trials due to fluctuating LOA.   Recommend to proceed with dysphagia 1 (puree) and thin liquids by cup sips only.  Total assist with all PO's.  Only administer PO's when fully alert.  ST to follow in acute care setting for diet tolerance due to history of dysphagia and current AMS.      Aspiration Risk  Moderate    Diet Recommendation Dysphagia 1 (Puree);Thin liquid  Liquid Administration via: Cup;No straw Medication Administration: Whole meds with puree Supervision: Full supervision/cueing for compensatory strategies;Staff to assist with self feeding Compensations: Slow rate;Small sips/bites;Check for pocketing Postural Changes and/or Swallow Maneuvers: Seated upright 90 degrees;Upright 30-60 min after meal    Other  Recommendations Oral Care Recommendations: Oral care Q4 per protocol   Follow Up Recommendations  Skilled Nursing facility    Frequency and Duration min 2x/week  2 weeks       SLP Swallow Goals     Swallow Study Prior Functional Status    Resident of SNF    General Date of Onset: 01/14/13 HPI: [name] is a 78yo woman with PMH of breast neoplasm with lumpectomy  X 2, hyperlipidemia, vascular dementia, CKD stage 2, DM, HTN, sacral ulcers (per report, from prior to admission), previous AMS (September admission from this year attributed to a UTI) and previous strokes with AMS from 2008 who presents for new AMS.  Per report (2 daughters at bedside, but did not witness event) while eating a meal today at [facility], [name] suddenly stopped chewing slumped forward and became less responsive.  She was transferred to the [facility] in concern for a new stroke.  Per her daughter, she had a similar episode a few days ago, but became responsive very quickly after being prompted.  Her symptoms prior to this acute event were some residual right hemiplegia and aphasia, however, she was able to speak some with prompting and move about with assistance.  When I spoke with her and the family, she was nonresponsive mostly to verbal stimuli, but did move extremities when asked.  Per her daughter, she had similar symptoms about a year ago due to a UTI and she improved to baseline.  She would like her urine checked.  In the ED, she has had a CT of her head with no acute changes, only chronic.  She further was found to be in acute renal failure with baseline Cr of 0.7-0.9 and rise to 2.45 -> 2.7.  Her urine has been sent to evaluate for infection.  UA was cloudy with minimal protein and small leukocytes and many bacteria.  She had an E.coli UTI previously which was sensitive to Cipro.  Type of Study: Bedside swallow evaluation Diet Prior to this Study: NPO Respiratory Status: Nasal cannula History of Recent Intubation: No Behavior/Cognition: Confused;Distractible;Doesn't follow directions Oral Cavity - Dentition: Missing dentition Self-Feeding Abilities: Total assist Patient Positioning: Upright in bed Baseline Vocal Quality: Clear Volitional Cough: Cognitively unable to elicit Volitional Swallow: Unable to elicit    Oral/Motor/Sensory Function Overall Oral Motor/Sensory  Function: Impaired at baseline   Ice Chips Ice chips: Not tested   Thin Liquid Thin Liquid: Impaired Presentation: Cup;Spoon Pharyngeal  Phase Impairments: Suspected delayed Swallow;Decreased hyoid-laryngeal movement    Nectar Thick Nectar Thick Liquid: Impaired Presentation: Cup;Spoon Pharyngeal Phase Impairments: Suspected delayed Swallow;Decreased hyoid-laryngeal movement   Honey Thick Honey Thick Liquid: Not tested   Puree Puree: Impaired Pharyngeal Phase Impairments: Suspected delayed Swallow;Decreased hyoid-laryngeal movement   Solid   GO  Moreen Fowler MS, CCC-SLP [phone] Solid: Not tested       [facility] 01/15/2013,3:54 PM

## 2013-01-15 NOTE — Progress Notes (Signed)
Subjective: No significant changes.   Exam: Filed Vitals:   01/15/13 1030  BP: 92/52  Pulse: 80  Temp: 98 F (36.7 C)  Resp: 20   Gen: In bed, NAD MS: Awake, Alert fixates and tracks, no speech or following commands.  ZO:XWRUE, EOMI, blinks to threat bialterally.  Motor: Spasticity in both arms, but does move them and squeeze hands(thoguh not to verbal command) Sensory:responds to nox stim x 4.   MRI reviewed- no stroke.   Impression: 78 yo F with AMS in teh setting of low hgb and uti. I suspect that her MS changes are more related to those issues than a primary neurological change. MRI shows no stroke, I have canceled stroke workup.   Of note, with the slumping yesterday, her sister says that is not uncommon for her.   As far as I can tell from the chart, she has had cardiac monitoring with no documented history of afib as well as multipel TEEs without embolic source found. As such, with her drop in hgb, I do not have any reason to strongly push for continued anticoagulation. She is anticoagulated currently for DVT, not for stroke prevention.   Recommendations: 1) Would treat UTI and address hemorrhage.  2) If anticoagulation is stopped, antiplatelet therapy would be indicated once safe from a hemodynamic standpoint.  3) will continue to follow.   Ritta Slot, MD [facility] [phone]  If 7pm- 7am, please page neurology on call at [phone].

## 2013-01-15 NOTE — Progress Notes (Signed)
Family Medicine Teaching Service Daily Progress Note Intern Pager: [phone]  Patient name: [name] Medical record number: [id number] Date of birth: 1934-09-27 Age: 78 y.o. Gender: female  Primary Care Provider: Tana Conch, MD Consultants: neurology Code Status: Full  Pt Overview and Major Events to Date:  01/14/13: patient admitted by triad for altered mental status with concern for new stroke 01/15/13: - patient transferred to FPTS since family medicine primary in nursing home - received 2u pRBC due to Hg: 5.5  Assessment and Plan: 78 yo female, nursing home resident, with h/o vascular dementia, CKD2, hypertension, h/o strokes, aortic stenosis, h/o DVT on xarelto who presented with altered mental state  # Altered mental status: brain MRI without acute infarct or other findings - evaluated by neurology who thinks this is more related to UTI and low Hg. Stroke workup canceled by neurology - hold aggrenox for now in context of acute drop in Hg - evaluated by speech who recommends dysphagia 1 diet with thin liquid    Hyperlipidemia: lipid panel: LDL: 45 on statin - continue statin  UTI (lower urinary tract infection): UA with small leuks and no nitrites - started on cipro on 01/14/13 - follow up urine culture  Acute kidney failure: baseline Cr from 05/24/12 was 0.7. Now 2.36 - continue NS at 75cc per hour. If BP's drop further, consider increasing rate. Echo from 2013 showing EF of 65-70% and no documented diastolic dysfunction.  - NS at 100cc/hr overnight.  - BMET in the AM   Vascular dementia  - Continue medications for dementia if able to swallow   Hypertension  - Pressure low on admission, now in the high 90's to 100's systolic - Will hold lisinopril in setting of acute renal failure and clonidine in setting of low BP   DM  - Family reports h/o DM, on SSI Pressure ulcers  - Present on admission, will consult wound care  Anemia - acute drop in Hg from 7.5 on 01/14/13  to 5.4. Unclear source of bleed: not intracranial.  - hemodynamically stable - received 2 u pRBC - repeat CBC - if continues to drop, consider CT abdomen to rule out intra-abdominal bleed.   FEN/GI: dysphagia 1, thin liquid PPx: SCD's in context of acute anemia  Disposition: back to [facility] pending improvement.   Subjective:  Received 2u pRBCs today  Objective: Temp:  [98 F (36.7 C)-99.3 F (37.4 C)] 98.1 F (36.7 C) (11/02 1447) Pulse Rate:  [75-89] 80 (11/02 1447) Resp:  [16-38] 20 (11/02 1447) BP: (78-115)/(29-86) 102/57 mmHg (11/02 1447) SpO2:  [98 %-100 %] 100 % (11/02 1447) Weight:  [213 lb 8 oz (96.843 kg)] 213 lb 8 oz (96.843 kg) (11/01 2103) Physical Exam: General: aphasic, follows commands Cardiovascular: S1S2, tachycardic, 3/6 systolic murmur heard throughout pericardium Respiratory: normal work of breathing, clear to auscultation on anterior exam Abdomen: soft, non distended, non tender Extremities: no pitting edema  Laboratory:  Recent Labs Lab 01/14/13 1446 01/14/13 1453 01/15/13 0325 01/15/13 0523  WBC 10.5  --  9.3 9.2  HGB 8.1* 7.5* 5.4* 5.5*  HCT 24.4* 22.0* 16.3* 16.5*  PLT 176  --  159 148*    Recent Labs Lab 01/14/13 1446 01/14/13 1453 01/15/13 0325  NA 146* 149* 147*  K 4.7 4.7 3.8  CL 111 115* 114*  CO2 18*  --  21  BUN 92* 90* 96*  CREATININE 2.45* 2.70* 2.36*  CALCIUM 9.1  --  8.1*  PROT 6.9  --   --  BILITOT 0.8  --   --   ALKPHOS 67  --   --   ALT 81*  --   --   AST 86*  --   --   GLUCOSE 239* 230* 151*     Imaging/Diagnostic Tests: MRI brain without contrast: 01/14/13: FINDINGS:  The study is moderately degraded by patient motion. Diffuse atrophy  and extensive white matter disease is present. There are remote  lacunar infarcts of the cerebellum bilaterally, right greater than  left. Remote encephalomalacia of the left parietal and occipital  lobe as well as the anterior right frontal lobe is stable. The   diffusion-weighted images demonstrate no evidence for acute  infarction. No hemorrhage or mass lesion is present. Flow present in  the major intracranial arteries. The globes and orbits are intact.  The paranasal sinuses and mastoid air cells the upper cervical spine  is unremarkable.  IMPRESSION:  1. No acute intracranial abnormality or significant interval change.  2. Stable atrophy and diffuse white matter disease.  3. Areas of remote encephalomalacia  CT head: 01/14/13 FINDINGS:  Advanced cerebral and cerebellar atrophy. Widespread chronic  microvascular ischemic change. Remote left parietal and right  frontal infarcts. No visible acute intracranial ischemic change. No  hemorrhage, mass lesion, or extra-axial fluid. No CT signs of  proximal vascular thrombosis. Calvarium intact. Clear sinuses and  mastoids. Unchanged appearance from priors.  IMPRESSION:  Stable findings of severe atrophy, chronic microvascular ischemic  change, and remote large vessel infarct. No acute intracranial  findings are evident.   Lonia Skinner, MD 01/15/2013, 4:16 PM PGY-3, [facility] FPTS Intern pager: [phone], text pages welcome

## 2013-01-15 NOTE — Progress Notes (Signed)
[name] paged at [facility] regarding pt's decreased blood pressure of 85/42, MAP 53. Pulse stable at 77 bpm. Pt has NS running at 100cc's/hr. Hx CKD stage II. Lynch ordered for fluids to be increased to 250cc's/hr for two hours and check BP every hour until MAP at 60 or higher then continue fluids at 100cc's/hr. Will continue to monitor pt's output and assess for fluid overload. Lungs diminished at this time, no change from admission. Salvadore Oxford, RN 01/15/13 [phone]

## 2013-01-15 NOTE — Consult Note (Signed)
WOC wound consult note Reason for Consult:Pressure ulcer, Stage II on sacrum, POA; Partial thickness tissue loss on ankle, POA. Wound type:Pressure, trauma Pressure Ulcer POA: Yes Measurement:Sacrum:  3cm x 2cm x 0.2cm, clean, pink, moist base.  Ankle: 2cm x 1.5cm x 0.1cm clean, pink, moist Wound bed:As described above. Drainage (amount, consistency, odor) Scant serous from sacrum, none from ankle Periwound:intact with evidence of previous healing. Dressing procedure/placement/frequency:I will implement a soft silicone foam dressing to both areas, Prevalon pressure redistribution boots to the bilateral heels and have suggested a turning schedule consistent with our routine except for meals.  I have not ordered a low air loss sleep surface as bilateral hips are intact and patient remains in the position to which she has been placed. Prevalon boots will provide therapy to the heels and the lateral malleolus injury. WOC nursing team will not follow, but will remain available to this patient, the nursing and medical team.  Please re-consult if needed. Thanks, Ladona Mow, MSN, RN, GNP, [place], CWON-AP [phone])

## 2013-01-16 ENCOUNTER — Inpatient Hospital Stay (HOSPITAL_COMMUNITY): Payer: Medicare Other

## 2013-01-16 LAB — CBC
HCT: 21.6 % — ABNORMAL LOW (ref 36.0–46.0)
HCT: 22 % — ABNORMAL LOW (ref 36.0–46.0)
Hemoglobin: 7.4 g/dL — ABNORMAL LOW (ref 12.0–15.0)
Hemoglobin: 7.5 g/dL — ABNORMAL LOW (ref 12.0–15.0)
MCHC: 34.1 g/dL (ref 30.0–36.0)
Platelets: 168 10*3/uL (ref 150–400)
RBC: 2.57 MIL/uL — ABNORMAL LOW (ref 3.87–5.11)
RBC: 2.62 MIL/uL — ABNORMAL LOW (ref 3.87–5.11)
WBC: 10.7 10*3/uL — ABNORMAL HIGH (ref 4.0–10.5)

## 2013-01-16 LAB — TYPE AND SCREEN
ABO/RH(D): B POS
Antibody Screen: NEGATIVE
Unit division: 0
Unit division: 0
Unit division: 0

## 2013-01-16 LAB — GLUCOSE, CAPILLARY
Glucose-Capillary: 140 mg/dL — ABNORMAL HIGH (ref 70–99)
Glucose-Capillary: 140 mg/dL — ABNORMAL HIGH (ref 70–99)
Glucose-Capillary: 198 mg/dL — ABNORMAL HIGH (ref 70–99)

## 2013-01-16 LAB — URINE CULTURE
Colony Count: NO GROWTH
Culture: NO GROWTH

## 2013-01-16 LAB — BASIC METABOLIC PANEL
Calcium: 8.1 mg/dL — ABNORMAL LOW (ref 8.4–10.5)
Chloride: 118 mEq/L — ABNORMAL HIGH (ref 96–112)
GFR calc Af Amer: 31 mL/min — ABNORMAL LOW (ref >90)
GFR calc non Af Amer: 27 mL/min — ABNORMAL LOW (ref >90)
Sodium: 148 mEq/L — ABNORMAL HIGH (ref 135–145)

## 2013-01-16 MED ORDER — SODIUM CHLORIDE 0.9 % IV SOLN
INTRAVENOUS | Status: DC
  Administered 2013-01-16 – 2013-01-17 (×3): via INTRAVENOUS

## 2013-01-16 NOTE — Progress Notes (Signed)
OT Cancellation Note/ Discharge  Patient Details Name: [name] MRN: [id number] DOB: 31-Dec-1934   Cancelled Treatment:    Reason Eval/Treat Not Completed: OT screened, no needs identified, will sign off. Pt at baseline requires total (A) for all ADLS. Spoke with PT Megan regarding [facility] SNF placement PTA. Pt with limited participation during PT Megan Evaluation. OT to sign off acutely  [place], [place] Pager: [phone]  01/16/2013, 9:14 AM

## 2013-01-16 NOTE — Progress Notes (Signed)
   CARE MANAGEMENT NOTE 01/16/2013  Patient:  [name], [name]   Account Number:  [id number]  Date Initiated:  01/16/2013  Documentation initiated by:  Jiles Crocker  Subjective/Objective Assessment:   ADMITTED WITH ALTERED MENTAL STATUS     Action/Plan:   PATIENT RESIDES IN A NURSING FACILITY; SOC WORKER REFERRAL PLACED   Anticipated DC Date:  01/20/2013   Anticipated DC Plan:  SKILLED NURSING FACILITY  In-house referral  Clinical Social Worker      DC Planning Services  CM consult              Status of service:  In process, will continue to follow Medicare Important Message given?  NA - LOS <3 / Initial given by admissions (If response is "NO", the following Medicare IM given date fields will be blank)  Per UR Regulation:  Reviewed for med. necessity/level of care/duration of stay  Comments:  11/3/2014Abelino Derrick RN,BSN,MHA [phone]

## 2013-01-16 NOTE — Progress Notes (Signed)
FMTS Attending Daily Note: Chizuko Trine MD [phone] pager office [phone] I  have seen and examined this patient, reviewed their chart. I have discussed this patient with the resident. I agree with the resident's findings, assessment and care plan. 

## 2013-01-16 NOTE — Progress Notes (Addendum)
INITIAL NUTRITION ASSESSMENT  DOCUMENTATION CODES Per approved criteria  -Obesity Unspecified   INTERVENTION: 1.  Supplements; Ensure Pudding po TID, each supplement provides 170 kcal and 4 grams of protein.  2.  General healthful diet; assistance with meals as needed.  Encourage intake as able.  NUTRITION DIAGNOSIS: Inadequate oral intake related to AMS as evidenced by PO 5%.   Monitor:  1.  Food/Beverage; pt meeting >/=90% estimated needs with tolerance. 2.  Wt/wt change; monitor trends  Reason for Assessment: MST  78 y.o. female  Admitting Dx: Stroke  ASSESSMENT: Pt admitted with AMS.  Per MD note, AMS likely related to low HgB and UTI vs stroke. Pt evalutated by SLP who determined pt is appropriate for Dysphagia 1, thin liquid diet.  PO intake is poor.  Pt has a Palliative meeting schedule for tomorrow (11/4).  Nutrition Focused Physical Exam: Subcutaneous Fat:  Orbital Region: WNL Upper Arm Region: WNL Thoracic and Lumbar Region: WNL  Muscle:  Temple Region: WNL Clavicle Bone Region: WNL Clavicle and Acromion Bone Region: WNL Scapular Bone Region: WNL Dorsal Hand: WNL Patellar Region: not assessed Anterior Thigh Region: not assessed Posterior Calf Region: not assessed  Edema: none present  Height: Ht Readings from Last 1 Encounters:  01/14/13 5\' 6"  (1.676 m)    Weight: Wt Readings from Last 1 Encounters:  01/14/13 213 lb 8 oz (96.843 kg)    Ideal Body Weight: 130 lbs  % Ideal Body Weight: 163%  Wt Readings from Last 10 Encounters:  01/14/13 213 lb 8 oz (96.843 kg)  12/15/12 221 lb 9.6 oz (100.517 kg)  12/18/12 219 lb (99.338 kg)  10/30/12 217 lb (98.431 kg)  09/01/12 227 lb (102.967 kg)  07/27/12 223 lb 8 oz (101.379 kg)  06/22/12 226 lb 12.8 oz (102.876 kg)  05/12/12 226 lb 3.2 oz (102.604 kg)  03/27/12 228 lb (103.42 kg)  02/16/12 217 lb 12.8 oz (98.793 kg)    Usual Body Weight: 220 lbs  % Usual Body Weight: 96%  BMI:  Body mass  index is 34.48 kg/(m^2).  Estimated Nutritional Needs: Kcal: 1680-1800 Protein: 77-90g Fluid: >1.8 L/day  Skin:  Stage 2 pressure ulcer  Diet Order: Dysphagia 1, thin  EDUCATION NEEDS: -No education needs identified at this time   Intake/Output Summary (Last 24 hours) at 01/16/13 1645 Last data filed at 01/16/13 1200  Gross per 24 hour  Intake    120 ml  Output   1100 ml  Net   -980 ml    Last BM: 11/2  Labs:   Recent Labs Lab 01/14/13 1446 01/14/13 1453 01/15/13 0325 01/16/13 0430  NA 146* 149* 147* 148*  K 4.7 4.7 3.8 3.5  CL 111 115* 114* 118*  CO2 18*  --  21 21  BUN 92* 90* 96* 84*  CREATININE 2.45* 2.70* 2.36* 1.78*  CALCIUM 9.1  --  8.1* 8.1*  GLUCOSE 239* 230* 151* 154*    CBG (last 3)   Recent Labs  01/16/13 0655 01/16/13 1114 01/16/13 1519  GLUCAP 140* 123* 140*    Scheduled Meds: . antiseptic oral rinse  15 mL Mouth Rinse BID  . atorvastatin  40 mg Oral Daily  . ciprofloxacin  400 mg Intravenous Q24H  . donepezil  10 mg Oral QPM  . insulin aspart  0-15 Units Subcutaneous TID WC  . insulin aspart  0-5 Units Subcutaneous QHS  . latanoprost  1 drop Both Eyes QHS  . venlafaxine XR  37.5 mg Oral Q  breakfast    Continuous Infusions: . sodium chloride 100 mL/hr at 01/16/13 1010    Past Medical History  Diagnosis Date  . Left hemiplegia   . Aphasia   . Muscle weakness   . Diabetes mellitus   . Malaise   . Fatigue   . Breast neoplasm   . Hypertension   . Glaucoma   . UTI (lower urinary tract infection)   . Vitamin D deficiency   . Hiatal hernia   . Constipation   . Diarrhea   . Asthma   . Obesity   . Hyperlipidemia   . Alzheimer's dementia   . Depression   . Allergic rhinitis   . Dementia   . Stroke     infarct     Past Surgical History  Procedure Laterality Date  . Joint replacement      left knee in '90s  . Breast surgery      lumpectomy x2    Loyce Dys, MS RD LDN Clinical Inpatient Dietitian Pager:  [phone] Weekend/After hours pager: [phone]

## 2013-01-16 NOTE — Evaluation (Signed)
Physical Therapy Evaluation Patient Details Name: [name] MRN: [id number] DOB: 24-Apr-1934 Today's Date: 01/16/2013 Time: 4098-1191 PT Time Calculation (min): 11 min  PT Assessment / Plan / Recommendation History of Present Illness  pt presents with R/O CVA.    Clinical Impression  Spoke with Nsg staff at [facility] where pt resided PTA, who state that pt was total care at baseline.  Pt appears to be at baseline level of mobility.  Will need return to SNF at D/C.  No further acute PT needs at this time.      PT Assessment  Patent does not need any further PT services    Follow Up Recommendations  SNF    Does the patient have the potential to tolerate intense rehabilitation      Barriers to Discharge        Equipment Recommendations  None recommended by PT    Recommendations for Other Services     Frequency      Precautions / Restrictions Precautions Precautions: Fall Restrictions Weight Bearing Restrictions: No   Pertinent Vitals/Pain Did not indicate pain.        Mobility  Bed Mobility Bed Mobility: Rolling Right;Scooting to HOB Rolling Right: 1: +2 Total assist Rolling Right: Patient Percentage: 0% Scooting to HOB: 1: +2 Total assist Scooting to [facility]: Patient Percentage: 0% Details for Bed Mobility Assistance: pt provided no A for bed mobility.   Transfers Transfers: Not assessed Ambulation/Gait Ambulation/Gait Assistance: Not tested (comment) Stairs: No Wheelchair Mobility Wheelchair Mobility: No Modified Rankin (Stroke Patients Only) Pre-Morbid Rankin Score: Severe disability Modified Rankin: Severe disability    Exercises     PT Diagnosis:    PT Problem List:   PT Treatment Interventions:       PT Goals(Current goals can be found in the care plan section)    Visit Information  Last PT Received On: 01/16/13 Assistance Needed: +2 History of Present Illness: pt presents with R/O CVA.         Prior Functioning  Home  Living Family/patient expects to be discharged to:: Skilled nursing facility Additional Comments: pt from [facility] and plan is for return to [facility].   Prior Function Level of Independence: Needs assistance Gait / Transfers Assistance Needed: Uses lift for any OOB activity.   ADL's / Homemaking Assistance Needed: Total care for all ADLs, except able to A with feeding.   Comments: Spoke with Nsg at [facility], who state pt was total care PTA and pt was only able to participate minimally with feeding.   Communication Communication:  English as a second language teacher)    Cognition  Cognition Arousal/Alertness: Awake/alert Behavior During Therapy: Flat affect Overall Cognitive Status: No family/caregiver present to determine baseline cognitive functioning    Extremity/Trunk Assessment Upper Extremity Assessment Upper Extremity Assessment: Difficult to assess due to impaired cognition Lower Extremity Assessment Lower Extremity Assessment: Difficult to assess due to impaired cognition   Balance Balance Balance Assessed: No  End of Session PT - End of Session Activity Tolerance: Patient tolerated treatment well Patient left: in bed;with call bell/phone within reach;with nursing/sitter in room Nurse Communication: Mobility status;Need for lift equipment  GP     Sunny Schlein, [place] [phone] 01/16/2013, 11:38 AM

## 2013-01-16 NOTE — Progress Notes (Signed)
EEG Completed; Results Pending  

## 2013-01-16 NOTE — Progress Notes (Signed)
Family Medicine Teaching Service Daily Progress Note Intern Pager: [phone]  Patient name: [name] Medical record number: [id number] Date of birth: Jul 12, 1934 Age: 78 y.o. Gender: female  Primary Care Provider: Tana Conch, MD Consultants: neurology Code Status: Full  Pt Overview and Major Events to Date:  01/14/13: patient admitted by triad for altered mental status with concern for new stroke  01/15/13: - patient transferred to FPTS since family medicine primary in nursing home  - received 2u pRBC due to Hg: 5.5  Assessment and Plan:  78 yo female, nursing home resident, with h/o vascular dementia, CKD2, hypertension, h/o strokes, aortic stenosis, h/o DVT on xarelto who presented with altered mental state    # Acute kidney failure: baseline Cr from 05/24/12 was 0.7 with increase to 2.36 on admission. Improving today at 1.78.  - responded to fluids. Echo from 2013 showing EF of 65-70% and no documented diastolic dysfunction.  - continue NS 100cc/hr  - BMET in the AM   # Altered mental status: brain MRI without acute infarct or other findings  - evaluated by neurology who thinks this is more related to UTI and low Hg. Stroke workup canceled by neurology  - hold aggrenox for now in context of acute drop in Hg  - evaluated by speech who recommends dysphagia 1 diet with thin liquid   # Anemia  - acute drop in Hg from 7.5 on 01/14/13 to 5.4. Unclear source of bleed: not intracranial.  - responded well to [facility] - hemodynamically stable   - CBC in am - if continues to drop, consider CT abdomen to rule out intra-abdominal bleed.  - xarelto and aggrenox stopped  Hyperlipidemia: lipid panel: LDL: 45 on statin  - continue statin   UTI (lower urinary tract infection): UA with small leuks and no nitrites  - started on cipro on 01/14/13, now day 3 - follow up urine culture   Vascular dementia  - Continue medications for dementia if able to swallow  Hypertension  - Pressure low on  admission, now in the high 90's to 100's systolic  - Will hold lisinopril in setting of acute renal failure and clonidine in setting of low BP   DM  - Family reports h/o DM, on SSI  Pressure ulcers  - Present on admission, will consult wound care   FEN/GI: dysphagia 1, thin liquid  PPx: SCD's in context of acute anemia  Disposition: back to [facility] pending improvement.  Would ideally like goals of care meeting to help with decision making onward.    Subjective:  No acute events overnight. Having more trouble with eating this morning.   Objective: Temp:  [97.6 F (36.4 C)-99.1 F (37.3 C)] 98.5 F (36.9 C) (11/03 0849) Pulse Rate:  [78-100] 78 (11/03 0849) Resp:  [16-20] 20 (11/03 0849) BP: (90-105)/(38-57) 105/45 mmHg (11/03 0849) SpO2:  [95 %-100 %] 95 % (11/03 0849) Physical Exam: General: non verbal, follows simple commands like squeezing hands and moving toes Cardiovascular: S1S2, rrr, 3/6 systolic murmur heard throughout pericardium Respiratory: CTA b/l Abdomen: soft, non tender Extremities: no edema Skin - skin abrasion around gluteus  Laboratory:  Recent Labs Lab 01/15/13 0523 01/15/13 1800 01/16/13 0430  WBC 9.2 11.9* 10.7*  HGB 5.5* 7.8* 7.4*  HCT 16.5* 22.5* 21.6*  PLT 148* 162 168    Recent Labs Lab 01/14/13 1446 01/14/13 1453 01/15/13 0325 01/16/13 0430  NA 146* 149* 147* 148*  K 4.7 4.7 3.8 3.5  CL 111 115* 114* 118*  CO2 18*  --  21 21  BUN 92* 90* 96* 84*  CREATININE 2.45* 2.70* 2.36* 1.78*  CALCIUM 9.1  --  8.1* 8.1*  PROT 6.9  --   --   --   BILITOT 0.8  --   --   --   ALKPHOS 67  --   --   --   ALT 81*  --   --   --   AST 86*  --   --   --   GLUCOSE 239* 230* 151* 154*     Imaging/Diagnostic Tests: Imaging/Diagnostic Tests:  MRI brain without contrast: 01/14/13:  FINDINGS:  The study is moderately degraded by patient motion. Diffuse atrophy  and extensive white matter disease is present. There are remote  lacunar infarcts of  the cerebellum bilaterally, right greater than  left. Remote encephalomalacia of the left parietal and occipital  lobe as well as the anterior right frontal lobe is stable. The  diffusion-weighted images demonstrate no evidence for acute  infarction. No hemorrhage or mass lesion is present. Flow present in  the major intracranial arteries. The globes and orbits are intact.  The paranasal sinuses and mastoid air cells the upper cervical spine  is unremarkable.  IMPRESSION:  1. No acute intracranial abnormality or significant interval change.  2. Stable atrophy and diffuse white matter disease.  3. Areas of remote encephalomalacia  CT head: 01/14/13  FINDINGS:  Advanced cerebral and cerebellar atrophy. Widespread chronic  microvascular ischemic change. Remote left parietal and right  frontal infarcts. No visible acute intracranial ischemic change. No  hemorrhage, mass lesion, or extra-axial fluid. No CT signs of  proximal vascular thrombosis. Calvarium intact. Clear sinuses and  mastoids. Unchanged appearance from priors.  IMPRESSION:  Stable findings of severe atrophy, chronic microvascular ischemic  change, and remote large vessel infarct. No acute intracranial  findings are evident.    Lonia Skinner, MD 01/16/2013, 9:12 AM PGY-3, [place] [facility] FPTS Intern pager: [phone], text pages welcome

## 2013-01-16 NOTE — Progress Notes (Signed)
FMTS Attending Note  Patient seen and examined by me together with resident Dr. Marena Chancy, I agree with Dr Whitney Muse assessment and plan as documented in this note. Paula Compton, MD

## 2013-01-16 NOTE — Progress Notes (Signed)
Thank you for consulting the Palliative Medicine Team at [facility] to meet your patient's and family's needs.   The reason that you asked [place] to see your patient is for goals of care  We have scheduled your patient for a meeting: Tuesday 01/17/13 @ 4:00 pm -at patient's sister's request  The Surrogate decision maker is: sister Colvin Caroli h: [phone] (of note-additional contact number noted in SNF paperwork was [phone])  Other family members that need to be present: per sister [name], she is the decision maker for patient; she may ask her daughter and another sister to be present for meeting  Your patient is able/unable to participate: TBD - on this visit patient awake non-verbal, not following commands  Additional Narrative: patient seen at bedside awake, non-verbal no family present-spoke via phone with patient's sister Colvin Caroli [phone]; discussed available meeting times today and tomorrow- she indicated she has her own doctor's appointment tomorrow and requests a late afternoon meeting time - meeting confirmed for tomorrow Tuesday 01/17/13 @ 4:00 pm   Valente David, RN 01/16/2013, 1:55 PM Palliative Medicine Team RN Liaison [phone]

## 2013-01-16 NOTE — Procedures (Signed)
History:  Background: There is a poorly defined posterior dominant rhythm of 7 Hz Hz that is clearly better seen on the right than left. The background otherwise consists of irregular theta and delta activities in addition to the more normal alpha and beta activities. There are no clear sleep structures seen.   EEG Abnormalities: 1) asymmetric PDR 2) generalized irregular slow activity 3) slow PDR  Clinical Interpretation: This EEG is consistent with a nonspecific generalized cerebral dysfunction with a superimposed focal area of cerebral dysfunction in the left occipital region. There was no seizure or seizure predisposition recorded on this study.   Ritta Slot, MD [facility] [phone]  If 7pm- 7am, please page neurology on call at [phone].

## 2013-01-16 NOTE — Progress Notes (Signed)
Subjective: No significant changes.   Exam: Filed Vitals:   01/16/13 1400  BP: 141/75  Pulse: 86  Temp: 98.7 F (37.1 C)  Resp: 20   Gen: In bed, NAD MS: Awake, Alert fixates and tracks, no speech or following commands.  ZO:XWRUE, EOMI, blinks to threat bialterally.  Motor: Spasticity in both arms, but does move them and squeeze hands(thoguh not to verbal command) Sensory:responds to nox stim x 4.    Impression: 78 yo F with AMS in the setting of low hgb and uti. I suspect that her MS changes are more related to those issues than a primary neurological change. MRI shows no stroke, I have canceled stroke workup.  Of note, with the slumping prompting admission, her sister says that is not uncommon for her.  As far as I can tell from the chart, she has had cardiac monitoring with no documented history of afib as well as multiple TEEs without embolic source found. As such, with her drop in hgb, I do not have any reason to strongly push for continued anticoagulation. She is anticoagulated currently for DVT, not for stroke prevention.   Her aphasia does not seem to be improving, will get EEG today. I continue to suspect, however, peeling the   Leukocytosis downtrending.   Recommendations: 1) Would treat UTI and address hemorrhage.  2) If anticoagulation is stopped, antiplatelet therapy(on aggrenox prior to admission) would be indicated once safe from a hemodynamic standpoint.  3) EEG 4) will continue to follow.   Ritta Slot, MD [facility] [phone]  If 7pm- 7am, please page neurology on call at [phone].

## 2013-01-17 DIAGNOSIS — M79609 Pain in unspecified limb: Secondary | ICD-10-CM

## 2013-01-17 DIAGNOSIS — Z515 Encounter for palliative care: Secondary | ICD-10-CM

## 2013-01-17 LAB — GLUCOSE, CAPILLARY
Glucose-Capillary: 167 mg/dL — ABNORMAL HIGH (ref 70–99)
Glucose-Capillary: 179 mg/dL — ABNORMAL HIGH (ref 70–99)
Glucose-Capillary: 139 mg/dL — ABNORMAL HIGH (ref 70–99)
Glucose-Capillary: 157 mg/dL — ABNORMAL HIGH (ref 70–99)

## 2013-01-17 LAB — BASIC METABOLIC PANEL
BUN: 60 mg/dL — ABNORMAL HIGH (ref 6–23)
Chloride: 120 mEq/L — ABNORMAL HIGH (ref 96–112)
Glucose, Bld: 165 mg/dL — ABNORMAL HIGH (ref 70–99)
Potassium: 3.6 mEq/L (ref 3.5–5.1)
Sodium: 149 mEq/L — ABNORMAL HIGH (ref 135–145)

## 2013-01-17 LAB — CBC
HCT: 23.2 % — ABNORMAL LOW (ref 36.0–46.0)
Hemoglobin: 7.8 g/dL — ABNORMAL LOW (ref 12.0–15.0)
MCHC: 33.6 g/dL (ref 30.0–36.0)
MCV: 85 fL (ref 78.0–100.0)

## 2013-01-17 MED ORDER — SODIUM CHLORIDE 0.45 % IV SOLN
INTRAVENOUS | Status: DC
  Administered 2013-01-17: 100 mL/h via INTRAVENOUS
  Administered 2013-01-17: 22:00:00 via INTRAVENOUS
  Administered 2013-01-18: 1000 mL via INTRAVENOUS

## 2013-01-17 MED ORDER — CIPROFLOXACIN IN D5W 400 MG/200ML IV SOLN
400.0000 mg | Freq: Two times a day (BID) | INTRAVENOUS | Status: DC
  Administered 2013-01-17 – 2013-01-19 (×5): 400 mg via INTRAVENOUS
  Filled 2013-01-17 (×6): qty 200

## 2013-01-17 NOTE — Progress Notes (Signed)
Speech Language Pathology Treatment: Dysphagia;Cognitive-Linquistic  Patient Details Name: [name] MRN: [id number] DOB: 03-Jun-1934 Today's Date: 01/17/2013 Time:  -     Assessment / Plan / Recommendation Clinical Impression  Pt demonstrates tolerance of small cup sips of thin liquids as seen in eval . Larger straw sips seem impaired by slightly delayed swallow response with concerning late sensation of aspirate. Pts arousal and responsiveness remain moderately impaired. SLP will continue to follow due to concern for tolerance. Continue full supervision with small cup sips according to instructions posted in room.    HPI HPI: [name] is a 78yo woman with PMH of breast neoplasm with lumpectomy X 2, hyperlipidemia, vascular dementia, CKD stage 2, DM, HTN, sacral ulcers (per report, from prior to admission), previous AMS (September admission from this year attributed to a UTI) and previous strokes with AMS from 2008 who presents for new AMS.  Per report (2 daughters at bedside, but did not witness event) while eating a meal today at [facility], [name] suddenly stopped chewing slumped forward and became less responsive.  She was transferred to the [facility] in concern for a new stroke.  Per her daughter, she had a similar episode a few days ago, but became responsive very quickly after being prompted.  Her symptoms prior to this acute event were some residual right hemiplegia and aphasia, however, she was able to speak some with prompting and move about with assistance.  When I spoke with her and the family, she was nonresponsive mostly to verbal stimuli, but did move extremities when asked.  Per her daughter, she had similar symptoms about a year ago due to a UTI and she improved to baseline.  She would like her urine checked.  In the ED, she has had a CT of her head with no acute changes, only chronic.  She further was found to be in acute renal failure with baseline Cr of 0.7-0.9 and rise to  2.45 -> 2.7.  Her urine has been sent to evaluate for infection.  UA was cloudy with minimal protein and small leukocytes and many bacteria.  She had an E.coli UTI previously which was sensitive to Cipro.    Pertinent Vitals NA  SLP Plan  Continue with current plan of care    Recommendations Diet recommendations: Dysphagia 1 (puree);Thin liquid Liquids provided via: No straw;Cup Medication Administration: Whole meds with puree Supervision: Full supervision/cueing for compensatory strategies;Staff to assist with self feeding Compensations: Slow rate;Small sips/bites;Check for pocketing Postural Changes and/or Swallow Maneuvers: Seated upright 90 degrees;Upright 30-60 min after meal              Oral Care Recommendations: Oral care BID Follow up Recommendations: Skilled Nursing facility Plan: Continue with current plan of care    GO     Maila Dukes, Riley Nearing 01/17/2013, 8:41 AM

## 2013-01-17 NOTE — Progress Notes (Signed)
PHARMACY NOTE  Pharmacy Consult for :  Cipro Indication:  UTI  Hospital Problems Principal Problem:   Stroke Active Problems:   Hyperlipidemia   Vascular dementia   CKD (chronic kidney disease) stage 2, GFR 60-89 ml/min   Hypertension   UTI (lower urinary tract infection)   Acute on chronic kidney failure   Weight: 97 kg  Vitals: BP 134/73  Pulse 105  Temp(Src) 98.6 F (37 C) (Oral)  Resp 18  Ht 5\' 6"  (1.676 m)  Wt 213 lb 8 oz (96.843 kg)  BMI 34.48 kg/m2  SpO2 99%  Labs:  Recent Labs  01/15/13 0325  01/16/13 0430 01/16/13 2320 01/17/13 0500 01/17/13 0530  WBC 9.3  < > 10.7* 10.5 11.4*  --   HGB 5.4*  < > 7.4* 7.5* 7.8*  --   PLT 159  < > 168 179 181  --   CREATININE 2.36*  --  1.78*  --   --  1.37*  . Estimated Creatinine Clearance: 41 ml/min (by C-G formula based on Cr of 1.37).   Microbiology: Recent Results (from the past 720 hour(s))  URINE CULTURE     Status: None   Collection Time    01/14/13  5:26 PM      Result Value Range Status   Specimen Description URINE, CATHETERIZED   Final   Special Requests NONE   Final   Culture  Setup Time     Final   Value: 01/15/2013 18:05     Performed at [facility] Count     Final   Value: NO GROWTH     Performed at [facility]   Culture     Final   Value: NO GROWTH     Performed at [facility]   Report Status 01/16/2013 FINAL   Final    Anti-infectives Anti-infectives   Start     Dose/Rate Route Frequency Ordered Stop   01/14/13 2230  ciprofloxacin (CIPRO) IVPB 400 mg     400 mg 200 mL/hr over 60 Minutes Intravenous Every 24 hours 01/14/13 2223        Assessment:  Day # 4 Cipro IV for treatment of UTI.  Patient afebrile.  WBC 11.4.  Renal function improved, CrCl > 40 ml/min, since admission.  Cipro dose will be adjusted to reflect improved renal status.  Goal of Therapy:   Resolution of UTI Antibiotics selected for infection/cultures and  adjusted for renal function.   Plan:   Change Cipro to 400 mg IV q 12 hours.    If PO intake improves, will change Cipro to 500 mg po q 12 hours.  Patient is taking other po meds without noted problems.  Laurena Bering, Pharm.D.  01/17/2013 2:13 PM

## 2013-01-17 NOTE — Consult Note (Signed)
Patient ID:[name]      DOB: 06/15/1934      JXB:[id number]     Consult Note from the Palliative Medicine Team at [facility]    Consult Requested by:  Dr. Criselda Peaches     PCP: Carla Conch, MD Reason for Consultation: GOC     Phone Number:[phone] Related symptom recommendations Assessment of patients Current state:78 yr old  African [place] female with [name] known history of advanced dementia.  Admitted with altered mental status.  She was eating at her facility when she suddenly slumped forward and became unresponsive. There was concern for new stroke, but CT scan and MRI did not support new stroke.  Patient remains somnolent intermittently.  I sat with her [name] and her Niece who at this time desire to continue full code status, full treatment.  [name] permitted me to talk globally about [name] comfort and dignity long term and she did accept [name] Hard Choices Booklet and [name] MOST for to review with her sister. [name] is not sure about using feeding tubes for [name] brief period of time and wants to think about this with her sister.  She would be open to Palliative Care services to follow on return to the SNF.   Goals of Care: 1.  Code Status: Full code   2. Scope of Treatment: Continue full treatment.  MOST for available for primary service to review with [name] when she is ready.   4. Disposition: Back to Snf when medically stable   3. Symptom Management: Currently, Nena [name] has pain in her left leg when moved due to arthritis. Tylenol could be used.  If we are indeed able to isolate to the knee could consider lidocaine patch or diclofenac gel in the future.  4. 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  She helped to raise her sister for which she is very grateful  5. Spiritual:  Christian faith.        Patient Documents Completed or Given: Document Given Completed  Advanced Directives Pkt    MOST    DNR    Gone from My Sight    Hard Choices      Brief HPI:     ROS: Unable  to obtain due to patient's inability communicate.    PMH:  Past Medical History  Diagnosis Date  . Left hemiplegia   . Aphasia   . Muscle weakness   . Diabetes mellitus   . Malaise   . Fatigue   . Breast neoplasm   . Hypertension   . Glaucoma   . UTI (lower urinary tract infection)   . Vitamin D deficiency   . Hiatal hernia   . Constipation   . Diarrhea   . Asthma   . Obesity   . Hyperlipidemia   . Alzheimer's dementia   . Depression   . Allergic rhinitis   . Dementia   . Stroke     infarct      PSH: Past Surgical History  Procedure Laterality Date  . Joint replacement      left knee in '90s  . Breast surgery      lumpectomy x2   I have reviewed the FH and SH and  If appropriate update it with new information. Allergies  Allergen Reactions  . Penicillins Other (See Comments)    Per MAR  . Tetanus Toxoids Other (See Comments)    Per MAR  . Vicodin [Hydrocodone-Acetaminophen] Other (See Comments)  Per MAR   Scheduled Meds: . antiseptic oral rinse  15 mL Mouth Rinse BID  . atorvastatin  40 mg Oral Daily  . ciprofloxacin  400 mg Intravenous Q12H  . donepezil  10 mg Oral QPM  . insulin aspart  0-15 Units Subcutaneous TID WC  . insulin aspart  0-5 Units Subcutaneous QHS  . latanoprost  1 drop Both Eyes QHS  . venlafaxine XR  37.5 mg Oral Q breakfast   Continuous Infusions: . sodium chloride 100 mL/hr (01/17/13 1146)   PRN Meds:.metoprolol, senna-docusate, sodium chloride    BP 120/75  Pulse 104  Temp(Src) 100.2 F (37.9 C) (Axillary)  Resp 18  Ht 5\' 6"  (1.676 m)  Wt 96.843 kg (213 lb 8 oz)  BMI 34.48 kg/m2  SpO2 96%   PPS: 30 %   Intake/Output Summary (Last 24 hours) at 01/17/13 2139 Last data filed at 01/17/13 1313  Gross per 24 hour  Intake    600 ml  Output   1750 ml  Net  -1150 ml   LBM: 01/16/13                        Physical Exam:  General: easily awakens, can shake head yes to some questions.  Not able to speak HEENT:   PERRL, EOMI, mm dry  Chest:   Decreased to the no crackles CVS: tachycardic, S1, S2, no murmur rub or   gallop Abdomen:obese, soft,not tender or distended Ext: trace to 1+ edema Neuro:awake but not able to communicate other than shaking head  Labs: CBC    Component Value Date/Time   WBC 11.4* 01/17/2013 0500   WBC 5.2 05/24/2012   WBC 4.4 06/25/2006 1009   RBC 2.73* 01/17/2013 0500   RBC 4.28 06/25/2006 1009   HGB 7.8* 01/17/2013 0500   HGB 11.9 06/25/2006 1009   HCT 23.2* 01/17/2013 0500   HCT 35.9 06/25/2006 1009   PLT 181 01/17/2013 0500   PLT 206 06/25/2006 1009   MCV 85.0 01/17/2013 0500   MCV 83.8 06/25/2006 1009   MCH 28.6 01/17/2013 0500   MCH 27.8 06/25/2006 1009   MCHC 33.6 01/17/2013 0500   MCHC 33.1 06/25/2006 1009   RDW 15.7* 01/17/2013 0500   RDW 14.7* 06/25/2006 1009   LYMPHSABS 1.3 01/14/2013 1446   LYMPHSABS 1.3 06/25/2006 1009   MONOABS 0.9 01/14/2013 1446   MONOABS 0.4 06/25/2006 1009   EOSABS 0.1 01/14/2013 1446   EOSABS 0.1 06/25/2006 1009   BASOSABS 0.0 01/14/2013 1446   BASOSABS 0.0 06/25/2006 1009       CMP     Component Value Date/Time   NA 149* 01/17/2013 0530   NA 147 05/24/2012   K 3.6 01/17/2013 0530   K 4.0 01/25/2012 1003   CL 120* 01/17/2013 0530   CL 111 01/25/2012 1003   CO2 19 01/17/2013 0530   CO2 25 01/25/2012 1003   GLUCOSE 165* 01/17/2013 0530   BUN 60* 01/17/2013 0530   BUN 26* 05/24/2012   BUN 30 01/13/2012 1627   CREATININE 1.37* 01/17/2013 0530   CREATININE 0.7 05/24/2012   CREATININE 0.88 01/25/2012 1003   CALCIUM 8.1* 01/17/2013 0530   CALCIUM 8.4 01/25/2012 1003   PROT 6.9 01/14/2013 1446   ALBUMIN 2.8* 01/14/2013 1446   AST 86* 01/14/2013 1446   ALT 81* 01/14/2013 1446   ALKPHOS 67 01/14/2013 1446   BILITOT 0.8 01/14/2013 1446   GFRNONAA 36* 01/17/2013 0530   GFRAA 42*  01/17/2013 0530    Chest Xray Reviewed/Impressions:  None  CT scan of the Head Reviewed/Impressions:   Stable findings of severe atrophy, chronic microvascular ischemic   change, and remote large vessel infarct. No acute intracranial  findings are evident.      Time In Time Out Total Time Spent with Patient Total Overall Time  500 pm 600 pm  60 min 60 min    Greater than 50%  of this time was spent counseling and coordinating care related to the above assessment and plan.   Kortnee Bas L. Ladona Ridgel, MD MBA The Palliative Medicine Team at [facility] Phone: [phone] Pager: [phone]

## 2013-01-17 NOTE — Consult Note (Signed)
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 She understands that Jenan is not thriving, has lost weight, gets dehydrated and is likely note eating well. She states at this time that she desire full care and full code.  She states at some point she needs to consider a change but that it is not now.  i gave her a Hard Choices Book and Most for to review. She is interested in palliative care services following along at the SNF.     Full code Full treatment request palliative care to follow at  snf.   Deivi Huckins L. Ladona Ridgel, MD MBA The Palliative Medicine Team at [facility] Phone: [phone] Pager: [phone]  500-  600 pm

## 2013-01-17 NOTE — Progress Notes (Signed)
Family Medicine Teaching Service Daily Progress Note Intern Pager: [phone]  Patient name: [name] Medical record number: [id number] Date of birth: 1934-11-21 Age: 78 y.o. Gender: female  Primary Care Provider: Tana Conch, MD Consultants: neurology Code Status: Full  Pt Overview and Major Events to Date:  11/1: patient admitted by [facility] for altered mental status with concern for new stroke  11/2: patient transferred to FPTS since family medicine primary in nursing home; received 2u pRBC due to Hg: 5.5  Assessment and Plan: 79 yo female, nursing home resident, with h/o vascular dementia, CKD2, hypertension, h/o strokes, aortic stenosis, h/o DVT on xarelto who presented with altered mental state. Echo from 2013 showing EF of 65-70% and no documented diastolic dysfunction.   # AKI: baseline Cr from 05/24/12 was 0.7 with increase to 2.45 on admission.  - responding to fluids.  - continue NS 100cc/hr  - BMET: Cr improved to 1.37 < 1.78; BUN 60 < 84 - Holding lasix; Consider not restarting at d/c  # Anemia  - acute drop in Hg from 7.5 on 01/14/13 to 5.4. Unclear source of bleed: not intracranial. FOBT (-) - responded well to 2u pRBC, hemodynamically stable   - if continues to drop, consider CT abdomen to rule out intra-abdominal bleed.  - xarelto and aggrenox stopped; consider not restarting on discharge pending goals of care mtg - CBC: 7.8 today; will continue to follow  # UTI (lower urinary tract infection): UA with small leuks and no nitrites  - Cipro day 3/7 (started  01/14/13) - urine culture: NG x 3 days  # Altered mental status: brain MRI without acute infarct or other findings  - evaluated by neurology who thinks this is more related to UTI and low Hg. Stroke workup canceled by neurology  - hold aggrenox for now in context of acute drop in Hg  - evaluated by speech who recommends dysphagia 1 diet with thin liquid   Hyperlipidemia: lipid panel: LDL: 45 on statin  -  continue statin   Vascular dementia  - Continue Aricept  Hypertension  - Pressure low on admission - Holding lisinopril in setting of acute renal failure and clonidine in setting of low BP   DM  - Family reports h/o DM, on SSI   Pressure ulcers  - Present on admission, wound care following   FEN/GI: dysphagia 1, thin liquid  1/2 NS 100 PPx: SCD's in context of acute anemia   Disposition: back to [facility] pending improvement; Goals of care meeting today @ 4pm to help with decision making onward.   Subjective:  No acute events overnight. Hx limited as pt is non verbal   Objective: Temp:  [97.6 F (36.4 C)-99.7 F (37.6 C)] 99.7 F (37.6 C) (11/04 0100) Pulse Rate:  [78-99] 99 (11/04 0100) Resp:  [20] 20 (11/04 0100) BP: (96-141)/(45-87) 96/46 mmHg (11/04 0100) SpO2:  [95 %-100 %] 100 % (11/04 0100) Physical Exam:  General: non verbal, follows simple commands like squeezing hands Cardiovascular: S1S2, rrr, 3/6 systolic murmur heard throughout pericardium Respiratory: CTA b/l Abdomen: soft, non tender Extremities: no edema Skin - skin abrasion around gluteus  Laboratory: Results for orders placed during the hospital encounter of 01/14/13 (from the past 24 hour(s))  GLUCOSE, CAPILLARY     Status: Abnormal   Collection Time    01/16/13  6:55 AM      Result Value Range   Glucose-Capillary 140 (*) 70 - 99 mg/dL  OCCULT BLOOD X 1 CARD TO LAB,  STOOL     Status: None   Collection Time    01/16/13 10:21 AM      Result Value Range   Fecal Occult Bld NEGATIVE  NEGATIVE  GLUCOSE, CAPILLARY     Status: Abnormal   Collection Time    01/16/13 11:14 AM      Result Value Range   Glucose-Capillary 123 (*) 70 - 99 mg/dL  GLUCOSE, CAPILLARY     Status: Abnormal   Collection Time    01/16/13  3:19 PM      Result Value Range   Glucose-Capillary 140 (*) 70 - 99 mg/dL  GLUCOSE, CAPILLARY     Status: Abnormal   Collection Time    01/16/13  9:44 PM      Result Value Range    Glucose-Capillary 198 (*) 70 - 99 mg/dL  CBC     Status: Abnormal   Collection Time    01/16/13 11:20 PM      Result Value Range   WBC 10.5  4.0 - 10.5 K/uL   RBC 2.62 (*) 3.87 - 5.11 MIL/uL   Hemoglobin 7.5 (*) 12.0 - 15.0 g/dL   HCT 16.1 (*) 09.6 - 04.5 %   MCV 84.0  78.0 - 100.0 fL   MCH 28.6  26.0 - 34.0 pg   MCHC 34.1  30.0 - 36.0 g/dL   RDW 40.9 (*) 81.1 - 91.4 %   Platelets 179  150 - 400 K/uL   Imaging/Diagnostic Tests:  MRI brain without contrast: 01/14/13:   IMPRESSION:  1. No acute intracranial abnormality or significant interval change.  2. Stable atrophy and diffuse white matter disease.  3. Areas of remote encephalomalacia   CT head: 01/14/13   IMPRESSION:  Stable findings of severe atrophy, chronic microvascular ischemic  change, and remote large vessel infarct. No acute intracranial  findings are evident.   Wenda Low, MD 01/17/2013, 4:33 AM PGY-3, [facility] FPTS Intern pager: [phone], text pages welcome

## 2013-01-17 NOTE — Progress Notes (Addendum)
01/16/13 1730  SLP Visit Information  SLP Received On 01/17/13  SLP Time Calculation  SLP Start Time 1630  SLP Stop Time 1650  SLP Time Calculation (min) 20 min  General Information  HPI [name] is a 78yo woman with PMH of breast neoplasm with lumpectomy X 2, hyperlipidemia, vascular dementia, CKD stage 2, DM, HTN, sacral ulcers (per report, from prior to admission), previous AMS (September admission from this year attributed to a UTI) and previous strokes with AMS from 2008 who presents for new AMS.  Per report (2 daughters at bedside, but did not witness event) while eating a meal today at [facility], [name] suddenly stopped chewing slumped forward and became less responsive.  She was transferred to the [facility] in concern for a new stroke.  Per her daughter, she had a similar episode a few days ago, but became responsive very quickly after being prompted.  Her symptoms prior to this acute event were some residual right hemiplegia and aphasia, however, she was able to speak some with prompting and move about with assistance.  When I spoke with her and the family, she was nonresponsive mostly to verbal stimuli, but did move extremities when asked.  Per her daughter, she had similar symptoms about a year ago due to a UTI and she improved to baseline.  She would like her urine checked.  In the ED, she has had a CT of her head with no acute changes, only chronic.  She further was found to be in acute renal failure with baseline Cr of 0.7-0.9 and rise to 2.45 -> 2.7.  Her urine has been sent to evaluate for infection.  UA was cloudy with minimal protein and small leukocytes and many bacteria.  She had an E.coli UTI previously which was sensitive to Cipro.   Prior Functional Status  Cognitive/Linguistic Baseline Baseline deficits  Baseline deficit details aphasia, vascular dementia  Type of Home Skilled Nursing Facility  Cognition  Overall Cognitive Status Impaired/Different from baseline   Arousal/Alertness Lethargic  Orientation Level (no response)  Attention Focused  Focused Attention Appears intact  Auditory Comprehension  Overall Auditory Comprehension Impaired  Yes/No Questions X  Basic Biographical Questions 0-25% accurate  Commands X  One Step Basic Commands 25-49% accurate  Visual Recognition/Discrimination  Discrimination X  Common Objects Unable to indentify  Verbal Expression  Overall Verbal Expression Impaired (does not verbalize)  Initiation Impaired  Oral Motor/Sensory Function  Overall Oral Motor/Sensory Function Impaired at baseline (per BSE eval)  Motor Speech  Overall Motor Speech Other (comment) (no attempte to speak)  SLP - End of Session  Patient left in bed  Assessment  Clinical Impression Statement Difficult to determine if pt changed from baseline given history of dementia and aphasia. Today pt arouses easily but remains lethargic during assessment, Pt able to nod yes less in 50% of trials, but with poor accuracy. Pt does not respond verbally to cues and follows commands in less than 50% of trials. Suspect cognition acutely worsened, will follow breifly for f/u to facilitate funcitonal communication as needed though hopeful for improvment with medical management per MD notes.   SLP Recommendation/Assessment Patient needs continued Speech Lanaguage Pathology Services  Problem List Auditory comprehension;Verbal expression;Attention  Therapy Diagnosis Aphasia;Cognitive Impairments  Plan  Speech Therapy Frequency min 1 x/week  Duration 2 weeks  Treatment/Interventions Language facilitation;Cueing hierarchy;Functional tasks;SLP instruction and feedback;Compensatory strategies;Patient/family education  Potential to Achieve Goals Poor  Potential Considerations Previous level of function;Co-morbidities  SLP Recommendations  Follow up Recommendations Skilled Nursing facility  SLP Evaluations  $ SLP Speech Visit 1 Procedure  SLP Evaluations  $  SLP EVAL LANGUAGE/SOUND PRODUCTION 1 Procedure

## 2013-01-17 NOTE — Progress Notes (Signed)
Subjective: No significant events overnight.   Exam: Filed Vitals:   01/17/13 1312  BP: 134/73  Pulse: 105  Temp: 98.6 F (37 C)  Resp: 18   Gen: In bed, NAD MS: Awake, Alert fixates and tracks, no speech , but does show me her teeth to command today. This si the only command that she follows  AV:WUJWJ, EOMI, blinks to threat bialterally.  Motor: Spasticity in both arms, but does move them  Sensory:responds to nox stim x 4.    Impression: 78 yo F with AMS in the setting of low hgb and uti. I suspect that her MS changes are more related to those issues than a primary neurological change. MRI shows no stroke, I have canceled stroke workup.  Of note, with the slumping prompting admission, her sister says that is not uncommon for her.  As far as I can tell from the chart, she has had cardiac monitoring with no documented history of afib as well as multiple TEEs without embolic source found. As such, with her drop in hgb, I do not have any reason to strongly push for continued anticoagulation. She is anticoagulated currently for DVT, not for stroke prevention.   I suspect a "peeling the onion" phenomenon. That is, umasking of previous deficits in the setting of pphysiological stressor, in this case aphasia. I would continue to work on her metabolic which seem to be improving, but no further recommendations from a neurological standpoint.   Recommendations: 1) Would treat UTI and address anemia 2) antiplatelet therapy(on aggrenox prior to admission) would be indicated once safe from a blood loss standpoint.  3) will sign off at this time, please call with any questions.    Ritta Slot, MD [facility] [phone]  If 7pm- 7am, please page neurology on call at [phone].

## 2013-01-18 ENCOUNTER — Inpatient Hospital Stay (HOSPITAL_COMMUNITY): Payer: Medicare Other

## 2013-01-18 LAB — GLUCOSE, CAPILLARY
Glucose-Capillary: 164 mg/dL — ABNORMAL HIGH (ref 70–99)
Glucose-Capillary: 174 mg/dL — ABNORMAL HIGH (ref 70–99)

## 2013-01-18 LAB — BASIC METABOLIC PANEL
GFR calc Af Amer: 52 mL/min — ABNORMAL LOW (ref >90)
GFR calc non Af Amer: 45 mL/min — ABNORMAL LOW (ref >90)
Potassium: 3.7 mEq/L (ref 3.5–5.1)
Sodium: 147 mEq/L — ABNORMAL HIGH (ref 135–145)

## 2013-01-18 LAB — CBC
MCHC: 33.2 g/dL (ref 30.0–36.0)
MCV: 85.4 fL (ref 78.0–100.0)
RDW: 16.1 % — ABNORMAL HIGH (ref 11.5–15.5)

## 2013-01-18 MED ORDER — ASPIRIN-DIPYRIDAMOLE ER 25-200 MG PO CP12
1.0000 | ORAL_CAPSULE | Freq: Two times a day (BID) | ORAL | Status: DC
  Administered 2013-01-18 – 2013-01-19 (×3): 1 via ORAL
  Filled 2013-01-18 (×4): qty 1

## 2013-01-18 MED ORDER — FUROSEMIDE 20 MG PO TABS
20.0000 mg | ORAL_TABLET | Freq: Every day | ORAL | Status: DC
  Administered 2013-01-19: 20 mg via ORAL
  Filled 2013-01-18: qty 1

## 2013-01-18 MED ORDER — FUROSEMIDE 10 MG/ML IJ SOLN
20.0000 mg | Freq: Once | INTRAMUSCULAR | Status: AC
  Administered 2013-01-18: 20 mg via INTRAVENOUS
  Filled 2013-01-18: qty 2

## 2013-01-18 MED ORDER — CIPROFLOXACIN HCL 500 MG PO TABS: 500.0000 mg | ORAL_TABLET | Freq: Two times a day (BID) | ORAL | Status: AC

## 2013-01-18 MED ORDER — RIVAROXABAN 20 MG PO TABS
20.0000 mg | ORAL_TABLET | Freq: Every day | ORAL | Status: DC
  Administered 2013-01-18: 20 mg via ORAL
  Filled 2013-01-18 (×2): qty 1

## 2013-01-18 NOTE — Progress Notes (Addendum)
ANTIBIOTIC CONSULT NOTE - INITIAL  Pharmacy Consult for Cipro Indication: UTI  Allergies  Allergen Reactions  . Penicillins Other (See Comments)    Per MAR  . Tetanus Toxoids Other (See Comments)    Per MAR  . Vicodin [Hydrocodone-Acetaminophen] Other (See Comments)    Per [facility]    Patient Measurements: Height: 5\' 6"  (167.6 cm) Weight: 213 lb 8 oz (96.843 kg) IBW/kg (Calculated) : 59.3  Vital Signs: Temp: 100.1 F (37.8 C) (11/05 1024) Temp src: Axillary (11/05 1024) BP: 124/72 mmHg (11/05 1024) Pulse Rate: 103 (11/05 1024) Labs:  Recent Labs  01/16/13 0430 01/16/13 2320 01/17/13 0500 01/17/13 0530 01/18/13 0500  WBC 10.7* 10.5 11.4*  --  12.0*  HGB 7.4* 7.5* 7.8*  --  7.6*  PLT 168 179 181  --  198  CREATININE 1.78*  --   --  1.37* 1.16*   Medical History: Past Medical History  Diagnosis Date  . Left hemiplegia   . Aphasia   . Muscle weakness   . Diabetes mellitus   . Malaise   . Fatigue   . Breast neoplasm   . Hypertension   . Glaucoma   . UTI (lower urinary tract infection)   . Vitamin D deficiency   . Hiatal hernia   . Constipation   . Diarrhea   . Asthma   . Obesity   . Hyperlipidemia   . Alzheimer's dementia   . Depression   . Allergic rhinitis   . Dementia   . Stroke     infarct    Assessment: 78 y/o F on D#5/7 cipro IV for UTI. Renal function has been improving, Scr 2.70 >> 1.18 with CrCl ~ 45-50 ml/min. No fever in the past 24 hrs, wbc remains elevated at 14.9  11/1 cipro>>  11/1 urine >> neg  Goal of Therapy:  Clinical resolution  Plan:  -Ciprofloxacin 400 mg IV Q 12hrs -Trend WBC, temp, renal function  Thank you for allowing me to take part in this patient's care,  Bayard Hugger, PharmD, BCPS  Clinical Pharmacist  Pager: [phone]   01/18/2013 1:08 PM

## 2013-01-18 NOTE — Clinical Social Work Psych Note (Signed)
Sister notified that patient will not DC until tomorrow. Per MD patient not ready for DC until 01/19/13.   Roddie Mc, [place], [place], [phone]

## 2013-01-18 NOTE — Progress Notes (Signed)
Chaplain went by to check on the patient and to offer support. Patient was asleep. Chaplain will follow up as needed or requested.   01/18/13 1215  Clinical Encounter Type  Visited With Patient  Visit Type Initial

## 2013-01-18 NOTE — Progress Notes (Signed)
Speech Language Pathology Treatment: Dysphagia  Patient Details Name: [name] MRN: [id number] DOB: 01-19-1935 Today's Date: 01/18/2013 Time: 4098-1191 SLP Time Calculation (min): 16 min  Assessment / Plan / Recommendation Clinical Impression  Pt sleepy today but did awake to gentle verbal stimulation.  Oral cavity full of yellow tinged material (? Applesauce mixed with crushed medicine) upon SLP entrance to room.  Pt did not swallow on direction due to level of cognitive impairment and ? Some motor planning issues.  After oral care provided, SLP administered ice cream bolus, pt attempts to manipulate but does NOT transfer posterior and after 30 seconds SLP orally suctioned.  Water was swallowed in more timely fashion.  Note per chart review, pt intake of liquids is better than pureed. RR was 27 during SLP visit, therefore intake given was minimal.   Pt nonverbal but did nod head yes/no appropriately.    Rec to consider to change diet to full liquids *no grits, pudding* to maximize swallow efficiency and safety given pt not transiting pureed/ice cream, etc.  Anticipate intake to continue to be poor, due to mentation, dysphagia.  Note palliative team on board.     RN informed of suspected oral containment of crushed pill that SLP removed and to diet modification recommendation.    HPI HPI: [name] is a 78yo woman with PMH of breast neoplasm with lumpectomy X 2, hyperlipidemia, vascular dementia, CKD stage 2, DM, HTN, sacral ulcers (per report, from prior to admission), previous AMS (September admission from this year attributed to a UTI) and previous strokes with AMS from 2008 who presents for new AMS.  Per report (2 daughters at bedside, but did not witness event) while eating a meal today at [facility], [name] suddenly stopped chewing slumped forward and became less responsive.  She was transferred to the [facility] in concern for a new stroke.  Per her daughter, she had a similar episode  a few days ago, but became responsive very quickly after being prompted.  Her symptoms prior to this acute event were some residual right hemiplegia and aphasia, however, she was able to speak some with prompting and move about with assistance.  When I spoke with her and the family, she was nonresponsive mostly to verbal stimuli, but did move extremities when asked.  Per her daughter, she had similar symptoms about a year ago due to a UTI and she improved to baseline.  She would like her urine checked.  In the ED, she has had a CT of her head with no acute changes, only chronic.  She further was found to be in acute renal failure with baseline Cr of 0.7-0.9 and rise to 2.45 -> 2.7.  Her urine has been sent to evaluate for infection.  UA was cloudy with minimal protein and small leukocytes and many bacteria.  She had an E.coli UTI previously which was sensitive to Cipro.    Pertinent Vitals Low grade temperature, weak cough  SLP Plan  Continue with current plan of care    Recommendations Diet recommendations: Thin liquid (rec consider change to full liquids to maximize efficiency and safety of swallow) Liquids provided via: Cup;No straw;Teaspoon Medication Administration: Other (Comment) (? given with icecream, follow with liquids) Supervision: Staff to assist with self feeding;Full supervision/cueing for compensatory strategies Compensations: Slow rate;Small sips/bites;Check for pocketing (oral suction if holding po and does not swallow) Postural Changes and/or Swallow Maneuvers: Upright 30-60 min after meal;Seated upright 90 degrees  Oral Care Recommendations: Oral care before and after PO (due to oral deficits and residuals without pt awareness) Follow up Recommendations: Skilled Nursing facility Plan: Continue with current plan of care    GO     Carla Burnet, MS [facility] SLP [phone]

## 2013-01-18 NOTE — Discharge Summary (Signed)
Family Medicine Teaching [facility] Discharge Summary  Patient name: [name] Medical record number: [id number] Date of birth: [birth year] Age: 78 y.o. Gender: female Date of Admission: 01/14/2013  Date of Discharge: 01/19/2013 Admitting Physician: Inez Catalina, MD ([facility] initially admitted, in error. Pt was then transferred to family practice teaching service).  Primary Care Provider: Tana Conch, MD Consultants: Neurology, palliative care, wound care  Indication for Hospitalization: Altered Mental Status  Discharge Diagnoses/Problem List:  Patient Active Problem List   Diagnosis Date Noted  . UTI (lower urinary tract infection) 01/14/2013  . Acute on chronic kidney failure 01/14/2013  . Anemia 05/25/2012  . Sacral pressure sore 02/15/2012  . Hypertension 12/27/2011  . Hyperlipidemia   . Vascular dementia   . Depression   . Allergic rhinitis    Disposition: to [facility]  Discharge Condition: stable  Brief Hospital Course: 78 yo female, nursing home resident, with h/o vascular dementia, CKD2, hypertension, h/o strokes, aortic stenosis, h/o DVT on xarelto who presented with altered mental state. Work-up reveal UTI, AKI (Cr 2.45) and Anemia (Hgb 5.4 from 7.5). Neurology was consulted and stroke work-up cancelled when MRI didn't show any acute infract. UTI was treated with Cipro. Renal function improved with hydration.   AMS: Mental status improve with rehydration, transfusion 2units for anemia and treatment of UTI. Goals of care mtg was held with daughters who desire full care, full code, and palliative care services following along at the SNF  Anemia: Xarelto and Aggrenox were held at admission. Hgb remained stable after 2 units of RBCs, and was 7.7 at discharge. No source of bleeding was determined. Xarelto and Aggrenox restarted 01/18/13, with Hgb stable the next day.  AKI: Cr was 2.45 on admission was most likely due to dehydration. Cr improved  to 1.18 at discharge  UTI: Difficult to obtain accurate Hx given pt's mental status. Cipro started on 01/14/13. Urine culture were negative x 5 days. WBC increased to 14.9 from 12 on 11/6, but remained afebrile on Cipro. This was continued at discharge until 01/21/2013.  Breast seroma: R breast noted by nursing staff to be enlarged and swollen. Seroma consistent with previous notes from nursing home noted on exam, but no marked tenderness or signs of infection. Recommend continued exams at nursing facility.  CHF: lasix initially held due to hypotension, but then patient proceeded to have some increased respiratory effort on 11/5. She was given lasix 20 mg IV and had good diuresis. Her home lasix dose was resumed upon discharge. She was on oxygen 2L at the time of discharge.  Leukocytosis: WBC rose during hospitalization, but no identifable infection source was identified (was treated with cipro for UTI). CXR on day of discharge did not show a pneumonia. Recommend repeating CBC on 11/8 and exploring for other sources of leukocytosis if indicated, at that time.  Issues for Follow Up:  1. Continue Cipro: last dose on 01/21/13 2. Check BMET and CBC on 11/8 3. Follow-up with palliative care about goals of care 4. Nutrition recs: SLP recommended changing to full liquid diet instead of pureed diet. 5. Sacral wound care 6. Serial breast exams for R breast seroma  Significant Procedures:   EEG: Clinical Interpretation: This EEG is consistent with a  nonspecific generalized cerebral dysfunction with a superimposed  focal area of cerebral dysfunction in the left occipital region.  There was no seizure or   Significant Labs and Imaging:   Recent Labs Lab 01/17/13 0500 01/18/13 0500 01/19/13 0505  WBC 11.4* 12.0* 14.9*  HGB 7.8* 7.6* 7.7*  HCT 23.2* 22.9* 23.2*  PLT 181 198 219    Recent Labs Lab 01/14/13 1446  01/15/13 0325 01/16/13 0430 01/17/13 0530 01/18/13 0500 01/19/13 0505  NA  146*  < > 147* 148* 149* 147* 145  K 4.7  < > 3.8 3.5 3.6 3.7 3.8  CL 111  < > 114* 118* 120* 117* 115*  CO2 18*  --  21 21 19 19  18*  GLUCOSE 239*  < > 151* 154* 165* 187* 188*  BUN 92*  < > 96* 84* 60* 43* 42*  CREATININE 2.45*  < > 2.36* 1.78* 1.37* 1.16* 1.18*  CALCIUM 9.1  --  8.1* 8.1* 8.1* 8.2* 8.1*  ALKPHOS 67  --   --   --   --   --   --   AST 86*  --   --   --   --   --   --   ALT 81*  --   --   --   --   --   --   ALBUMIN 2.8*  --   --   --   --   --   --   < > = values in this interval not displayed.  Urine cx no growth  MR Brain 11/1: 1. No acute intracranial abnormality or significant interval change.  2. Stable atrophy and diffuse white matter disease.  3. Areas of remote encephalomalacia  CT Head 11/1: Stable findings of severe atrophy, chronic microvascular ischemic  change, and remote large vessel infarct. No acute intracranial  findings are evident.   CXR 11/6: The findings suggest low-grade CHF. There is no focal pneumonia.  Results/Tests Pending at Time of Discharge:   Discharge Medications:    Medication List    STOP taking these medications       acetaminophen 650 MG CR tablet  Commonly known as:  TYLENOL     cloNIDine 0.1 mg/24hr patch  Commonly known as:  CATAPRES - Dosed in mg/24 hr      TAKE these medications       atorvastatin 40 MG tablet  Commonly known as:  LIPITOR  Take 40 mg by mouth every evening.     barrier cream Crea  Commonly known as:  non-specified  Apply 1 application topically 3 (three) times daily. To bottom after diaper change     calcium citrate 950 MG tablet  Commonly known as:  CALCITRATE - dosed in mg elemental calcium  Take 1 tablet by mouth daily.     ciprofloxacin 500 MG tablet  Commonly known as:  CIPRO  Take 1 tablet (500 mg total) by mouth 2 (two) times daily.     dipyridamole-aspirin 200-25 MG per 12 hr capsule  Commonly known as:  AGGRENOX  Take 1 capsule by mouth 2 (two) times daily.      donepezil 10 MG tablet  Commonly known as:  ARICEPT  Take 10 mg by mouth every evening.     fluticasone 50 MCG/ACT nasal spray  Commonly known as:  FLONASE  Place 2 sprays into the nose daily.     FOLBEE 2.5-25-1 MG Tabs tablet  Generic drug:  Folic Acid-Vit B6-Vit B12  Take 1 tablet by mouth daily.     furosemide 20 MG tablet  Commonly known as:  LASIX  Take 1 tablet (20 mg total) by mouth daily.     metoprolol succinate 12.5 mg Tb24 24 hr tablet  Commonly known as:  TOPROL-XL  Take 0.5 tablets (12.5 mg total) by mouth daily.     multivitamin with minerals Tabs tablet  Take 1 tablet by mouth daily.     polyethylene glycol powder powder  Commonly known as:  GLYCOLAX/MIRALAX  Take 17 g by mouth daily as needed. FOR CONSTIPATION     promethazine 25 MG tablet  Commonly known as:  PHENERGAN  Take 25 mg by mouth every 6 (six) hours as needed for nausea.     ranitidine 150 MG tablet  Commonly known as:  ZANTAC  Take 150 mg by mouth every evening.     Rivaroxaban 20 MG Tabs tablet  Commonly known as:  XARELTO  Take 1 tablet (20 mg total) by mouth daily.     Travoprost (BAK Free) 0.004 % Soln ophthalmic solution  Commonly known as:  TRAVATAN  Place 1 drop into both eyes at bedtime.     UTI-STAT Liqd  Take 30 mLs by mouth daily.     venlafaxine XR 75 MG 24 hr capsule  Commonly known as:  EFFEXOR-XR  Take 75 mg by mouth daily.     Vitamin D (Ergocalciferol) 50000 UNITS Caps capsule  Commonly known as:  DRISDOL  Take 50,000 Units by mouth every 30 (thirty) days. Give q 15th day of each month     ZESTRIL 2.5 MG tablet  Generic drug:  lisinopril  Take 1 tablet (2.5 mg total) by mouth daily.       Discharge Instructions: Please refer to Patient Instructions section of EMR for full details.  Patient was counseled important signs and symptoms that should prompt return to medical care, changes in medications, dietary instructions, activity restrictions, and follow up  appointments.   Follow-up Information   Schedule an appointment as soon as possible for a visit with Tana Conch, MD.   Specialty:  Family Medicine   Contact information:   [address] [place] [address] [phone]      Latrelle Dodrill, MD 01/19/2013, 4:14 PM PGY-2, [facility]

## 2013-01-18 NOTE — Progress Notes (Addendum)
Pt breathing at 32 bpm and diaphoretic upon assessment, VS taken, all WNL with exception of respirations.  MD notified of change in pt condition.  MD to see pt, for now no new orders.  Will monitor closely until MD arrives.

## 2013-01-18 NOTE — Progress Notes (Signed)
Pt seen and examined. Limited exam due to minimal participation secondary to dementia and physical exam limited secondary to body habitus.  Pt states she feels a little SOB. No further explenation. III/VI systolic murmur w/ breath sounds diminished in the R lung base. On [place] and tachypnic. Mildly distressed appearing Tachycardic, afebrile, tachypnic, O2 sats stable. WBC slightly elevated from this am. - Port CXR 1 view to look for possible pulmonary process, unlikely but concern for PNA or effusion given worsening clinical picture and diminished breath sounds.  Shelly Flatten, MD Family Medicine PGY-3 01/18/2013, 6:58 PM

## 2013-01-18 NOTE — Clinical Social Work Psychosocial (Signed)
Clinical Social Work Department BRIEF PSYCHOSOCIAL ASSESSMENT 01/18/2013  Patient:  [name], [name]     Account Number:  [id number]     Admit date:  01/14/2013  Clinical Social Worker:  Lavell Luster  Date/Time:  01/18/2013 10:00 AM  Referred by:  Physician  Date Referred:  01/18/2013 Referred for SNF Placement  Other Referral:   Interview type:  Family Other interview type:   Patient not alert and oriented x4 at time of assessment. Sister Colvin Caroli was interviewed.   PSYCHOSOCIAL DATA Living Status:  FACILITY Admitted from facility:  [facility] Level of care:  Skilled Nursing Facility Primary support name:  Colvin Caroli [phone] Primary support relationship to patient:  [place] Degree of support available:   Support is limited. Patient has sisters and neices and nephews in the area.   CURRENT CONCERNS Current Concerns Post-Acute Placement  Other Concerns:    SOCIAL WORK ASSESSMENT / PLAN Sister plans for patient to return to [facility] upon DC. This has been confirmed with facility. Patient is a long term resident at facility.  Assessment/plan status:  Psychosocial Support/Ongoing Assessment of Needs Other assessment/ plan:   Complete FL2, Fax  Information/referral to community resources:   None   PATIENT'S/FAMILY'S RESPONSE TO PLAN OF CARE: Sister plans for patient to return to [facility] upon DC. Sister was pleasant and appreciaitve of CSW contact. CSW will continue to follow for DC needs.       Roddie Mc, [place], [place], [phone]

## 2013-01-18 NOTE — Progress Notes (Signed)
FMTS Attending Note  Patient seen and examined by me, discussed with resident team and I agree with Dr Whitman Hero assessment and plan as per this note.  Patient is eating with assistance of an aide who feeds her.  Her Hgb has been stable for 3 consecutive days.  Plan to restart her Xarelto and Aggrenox today, watch Hgb overnight (recheck in AM) and then discharge to SNF tomorrow.  Appreciate Palliative Care Team's assistance in establishing Goals of Care.  Paula Compton, MD

## 2013-01-18 NOTE — Progress Notes (Signed)
Pt still tachypneic, Vital signs taken and recorded.  Pt is a mouth breather.  Informed Dr. Shelly Flatten with orders made and carried out.  Will continue to monitor.

## 2013-01-18 NOTE — Progress Notes (Signed)
Family Medicine Teaching Service Daily Progress Note Intern Pager: [phone]  Patient name: [name] Medical record number: [id number] Date of birth: 1934-10-22 Age: 78 y.o. Gender: female  Primary Care Provider: Tana Conch, MD Consultants: neurology Code Status: Full  Pt Overview and Major Events to Date:  11/1: patient admitted by triad for altered mental status with concern for new stroke  11/2: patient transferred to FPTS since family medicine primary in nursing home; received 2u pRBC due to Hg: 5.5  Assessment and Plan: 78 yo female, nursing home resident, with h/o vascular dementia, CKD2, hypertension, h/o strokes, aortic stenosis, h/o DVT on xarelto who presented with altered mental state. Echo from 2013 showing EF of 65-70% and no documented diastolic dysfunction.   # AKI: baseline Cr from 05/24/12 was 0.7 with increase to 2.45 on admission.  - BMET: Cr 1.16 (11/5) < 1.37 (11.4) < 2.45 (admit) - Holding lasix; Consider not restarting at d/c  # Anemia  - acute drop in Hg from 7.5 on 01/14/13 to 5.4. Unclear source of bleed: not intracranial. FOBT (-) - responded well to 2u pRBC, hemodynamically stable   - xarelto and aggrenox: restarted - CBC: stabilized at 7.6 from 7.8 11/4  # UTI (lower urinary tract infection): UA with small leuks and no nitrites  - Cipro day 4/7 (started  01/14/13) - urine culture: NG x 4 days  # Altered mental status: brain MRI without acute infarct or other findings  - evaluated by neurology who thinks this is more related to UTI and low Hg. Stroke workup canceled by neurology  - evaluated by speech who recommends dysphagia 1 diet with thin liquid   Hyperlipidemia: lipid panel: LDL: 45 on statin  - continue statin   Vascular dementia  - Continue Aricept  Hypertension  - Pressure low on admission - Holding lisinopril in setting of acute renal failure which is improving and stopped clonidine in setting of low BP - 139/68 on 11/5:    DM  -  Family reports h/o DM, on SSI   Pressure ulcers  - Present on admission  Social - Goals of care mtg 11/4. Daughter interested in palliative care services following along at the SNF  FEN/GI: dysphagia 1, thin liquid  KVO PPx: SCD's in context of acute anemia   Disposition: discharge back to [facility] today or early tomorrow  Subjective:  No acute events overnight. Hx limited as pt is non verbal. Nurse reports eating small amount, but drinking well. Goals of care discussion with family yesterday.   Objective: Temp:  [97.2 F (36.2 C)-100.2 F (37.9 C)] 97.9 F (36.6 C) (11/05 0119) Pulse Rate:  [99-110] 110 (11/05 0119) Resp:  [18-20] 18 (11/05 0119) BP: (99-139)/(61-75) 139/68 mmHg (11/05 0119) SpO2:  [95 %-100 %] 95 % (11/05 0119) Physical Exam:  General: non verbal, follows simple commands Cardiovascular: S1S2, rrr, 3/6 systolic murmur heard throughout pericardium Respiratory: CTA b/l Abdomen: soft, non tender Extremities: no edema Skin - skin abrasion around gluteus  Laboratory: Results for orders placed during the hospital encounter of 01/14/13 (from the past 24 hour(s))  CBC     Status: Abnormal   Collection Time    01/17/13  5:00 AM      Result Value Range   WBC 11.4 (*) 4.0 - 10.5 K/uL   RBC 2.73 (*) 3.87 - 5.11 MIL/uL   Hemoglobin 7.8 (*) 12.0 - 15.0 g/dL   HCT 14.7 (*) 82.9 - 56.2 %   MCV 85.0  78.0 -  100.0 fL   MCH 28.6  26.0 - 34.0 pg   MCHC 33.6  30.0 - 36.0 g/dL   RDW 40.9 (*) 81.1 - 91.4 %   Platelets 181  150 - 400 K/uL  BASIC METABOLIC PANEL     Status: Abnormal   Collection Time    01/17/13  5:30 AM      Result Value Range   Sodium 149 (*) 135 - 145 mEq/L   Potassium 3.6  3.5 - 5.1 mEq/L   Chloride 120 (*) 96 - 112 mEq/L   CO2 19  19 - 32 mEq/L   Glucose, Bld 165 (*) 70 - 99 mg/dL   BUN 60 (*) 6 - 23 mg/dL   Creatinine, Ser 7.82 (*) 0.50 - 1.10 mg/dL   Calcium 8.1 (*) 8.4 - 10.5 mg/dL   GFR calc non Af Amer 36 (*) >90 mL/min   GFR calc Af  Amer 42 (*) >90 mL/min  GLUCOSE, CAPILLARY     Status: Abnormal   Collection Time    01/17/13  7:26 AM      Result Value Range   Glucose-Capillary 167 (*) 70 - 99 mg/dL   Comment 1 Documented in Chart     Comment 2 Notify RN    GLUCOSE, CAPILLARY     Status: Abnormal   Collection Time    01/17/13 11:21 AM      Result Value Range   Glucose-Capillary 179 (*) 70 - 99 mg/dL   Comment 1 Notify RN    GLUCOSE, CAPILLARY     Status: Abnormal   Collection Time    01/17/13  4:13 PM      Result Value Range   Glucose-Capillary 139 (*) 70 - 99 mg/dL  GLUCOSE, CAPILLARY     Status: Abnormal   Collection Time    01/17/13  9:55 PM      Result Value Range   Glucose-Capillary 157 (*) 70 - 99 mg/dL   Imaging/Diagnostic Tests:  MRI brain without contrast: 01/14/13:   IMPRESSION:  1. No acute intracranial abnormality or significant interval change.  2. Stable atrophy and diffuse white matter disease.  3. Areas of remote encephalomalacia   CT head: 01/14/13   IMPRESSION:  Stable findings of severe atrophy, chronic microvascular ischemic  change, and remote large vessel infarct. No acute intracranial  findings are evident.   Wenda Low, MD 01/18/2013, 3:20 AM PGY-3, [facility] FPTS Intern pager: [phone], text pages welcome

## 2013-01-19 ENCOUNTER — Inpatient Hospital Stay (HOSPITAL_COMMUNITY): Payer: Medicare Other

## 2013-01-19 LAB — BASIC METABOLIC PANEL
CO2: 18 mEq/L — ABNORMAL LOW (ref 19–32)
Chloride: 115 mEq/L — ABNORMAL HIGH (ref 96–112)
Glucose, Bld: 188 mg/dL — ABNORMAL HIGH (ref 70–99)
Potassium: 3.8 mEq/L (ref 3.5–5.1)
Sodium: 145 mEq/L (ref 135–145)

## 2013-01-19 LAB — GLUCOSE, CAPILLARY: Glucose-Capillary: 199 mg/dL — ABNORMAL HIGH (ref 70–99)

## 2013-01-19 LAB — CBC
HCT: 23.2 % — ABNORMAL LOW (ref 36.0–46.0)
Hemoglobin: 7.7 g/dL — ABNORMAL LOW (ref 12.0–15.0)
MCH: 28.6 pg (ref 26.0–34.0)
MCV: 86.2 fL (ref 78.0–100.0)
RBC: 2.69 MIL/uL — ABNORMAL LOW (ref 3.87–5.11)

## 2013-01-19 MED ORDER — FUROSEMIDE 20 MG PO TABS: 20.0000 mg | ORAL_TABLET | Freq: Every day | ORAL | Status: DC

## 2013-01-19 MED ORDER — METOPROLOL SUCCINATE 12.5 MG HALF TABLET: 12.5000 mg | ORAL_TABLET | Freq: Every day | ORAL | Status: DC

## 2013-01-19 MED ORDER — METOPROLOL SUCCINATE 12.5 MG HALF TABLET
12.5000 mg | ORAL_TABLET | Freq: Every day | ORAL | Status: DC
  Administered 2013-01-19: 12.5 mg via ORAL
  Filled 2013-01-19: qty 1

## 2013-01-19 NOTE — Progress Notes (Signed)
FMTS Attending Note Patient seen and examined by me, discussed with resident team and I agree with assessment and plan as per Dr. Whitman Hero note.  Patient appears comfortable today, after some dyspnea last night which has resolved with diuresis.  She continues to be afebrile, and her Hgb has remained stable after reinstating her anticoagulants (Xarelto, Aggrenox) which she takes for h/o CVA, h/o VTE.  She is eating with the assistance of an aide. Her WBC count is up slightly without identifiable focus of infection (being treated for UTI with Cipro).  Plan to check 2-view CXR to consider PNA as source of WBC count, although her respiratory distress resolved with diuresis last night.  In all other respects she appears to be optimized for return to SNF later this afternoon pending CXR results and assuming no clinical changes.  Paula Compton, MD

## 2013-01-19 NOTE — Progress Notes (Signed)
Family Medicine Teaching Service Daily Progress Note Intern Pager: [phone]  Patient name: [name] Medical record number: [id number] Date of birth: May 02, 1934 Age: 78 y.o. Gender: female  Primary Care Provider: Tana Conch, MD Consultants: neurology Code Status: Full  Pt Overview and Major Events to Date:  11/1: patient admitted by triad for altered mental status with concern for new stroke  11/2: patient transferred to FPTS since family medicine primary in nursing home; received 2u pRBC due to Hg: 5.5  Assessment and Plan: 78 yo female, nursing home resident, with h/o vascular dementia, CKD2, hypertension, h/o strokes, aortic stenosis, h/o DVT on xarelto who presented with altered mental state. Echo from 2013 showing EF of 65-70% and no documented diastolic dysfunction.   # AKI: baseline Cr from 05/24/12 was 0.7 with increase to 2.45 on admission.  - BMET: Cr 1.16 (11/5) < 1.37 (11.4) < 2.45 (admit) - Restarted lasix 20 mg PO (20mg  IV was given last night)  # Anemia  - acute drop in Hg from 7.5 on 01/14/13 to 5.4. Unclear source of bleed: not intracranial. FOBT (-) - responded well to 2u pRBC, hemodynamically stable   - xarelto and aggrenox: restarted yesterday  - CBC: stabilized at 7.7 > 7.6 yesterday   # UTI (lower urinary tract infection): UA with small leuks and no nitrites  - Cipro day 5/7 (started  01/14/13) - urine culture: NG x 5 days - WBC 14.9 today from 12 yesterday; likely somewhat hemo concentrated (900cc UOP last night w/ Lasix)  # Altered mental status: brain MRI without acute infarct or other findings  - evaluated by neurology who thinks this is more related to UTI and low Hg. Stroke workup canceled by neurology  - evaluated by speech who recommends dysphagia 1 diet with thin liquid   Hyperlipidemia: lipid panel: LDL: 45 on statin  - continue statin   Vascular dementia  - Continue Aricept  Hypertension  - Pressure low on admission - Holding lisinopril  in setting of acute renal failure which is improving and stopped clonidine in setting of low BP - 139/68 on 11/5:    DM  - Family reports h/o DM, on SSI   Pressure ulcers  - Present on admission  Social - Goals of care mtg 11/4. Daughter interested in palliative care services following along at the SNF  FEN/GI: dysphagia 1, thin liquid  KVO PPx: SCD's in context of acute anemia   Disposition: discharge back to [facility] today   Subjective:  No acute events overnight. Hx limited as pt is non verbal. Nods head yes when ask if SOB is better than yesterday. Denies any pain.   Objective: Temp:  [98.2 F (36.8 C)-100.1 F (37.8 C)] 98.9 F (37.2 C) (11/06 0500) Pulse Rate:  [101-115] 101 (11/06 0500) Resp:  [16-32] 20 (11/06 0500) BP: (103-127)/(55-80) 124/55 mmHg (11/06 0500) SpO2:  [97 %-100 %] 100 % (11/06 0500) Physical Exam:  General: non verbal, follows simple commands Cardiovascular: S1S2, rrr, 3/6 systolic murmur heard throughout pericardium Respiratory: Mild crackles b/l Extremities: no edema Skin - skin abrasion around gluteus  Laboratory: Results for orders placed during the hospital encounter of 01/14/13 (from the past 24 hour(s))  GLUCOSE, CAPILLARY     Status: Abnormal   Collection Time    01/18/13 11:59 AM      Result Value Range   Glucose-Capillary 176 (*) 70 - 99 mg/dL  GLUCOSE, CAPILLARY     Status: Abnormal   Collection Time  01/18/13  4:30 PM      Result Value Range   Glucose-Capillary 174 (*) 70 - 99 mg/dL  GLUCOSE, CAPILLARY     Status: Abnormal   Collection Time    01/18/13  9:27 PM      Result Value Range   Glucose-Capillary 156 (*) 70 - 99 mg/dL   Comment 1 Documented in Chart     Comment 2 Notify RN    CBC     Status: Abnormal   Collection Time    01/19/13  5:05 AM      Result Value Range   WBC 14.9 (*) 4.0 - 10.5 K/uL   RBC 2.69 (*) 3.87 - 5.11 MIL/uL   Hemoglobin 7.7 (*) 12.0 - 15.0 g/dL   HCT 25.3 (*) 66.4 - 40.3 %   MCV 86.2   78.0 - 100.0 fL   MCH 28.6  26.0 - 34.0 pg   MCHC 33.2  30.0 - 36.0 g/dL   RDW 47.4 (*) 25.9 - 56.3 %   Platelets 219  150 - 400 K/uL  BASIC METABOLIC PANEL     Status: Abnormal   Collection Time    01/19/13  5:05 AM      Result Value Range   Sodium 145  135 - 145 mEq/L   Potassium 3.8  3.5 - 5.1 mEq/L   Chloride 115 (*) 96 - 112 mEq/L   CO2 18 (*) 19 - 32 mEq/L   Glucose, Bld 188 (*) 70 - 99 mg/dL   BUN 42 (*) 6 - 23 mg/dL   Creatinine, Ser 8.75 (*) 0.50 - 1.10 mg/dL   Calcium 8.1 (*) 8.4 - 10.5 mg/dL   GFR calc non Af Amer 44 (*) >90 mL/min   GFR calc Af Amer 51 (*) >90 mL/min   Imaging/Diagnostic Tests:  MRI brain without contrast: 01/14/13:   IMPRESSION:  1. No acute intracranial abnormality or significant interval change.  2. Stable atrophy and diffuse white matter disease.  3. Areas of remote encephalomalacia   CT head: 01/14/13   IMPRESSION:  Stable findings of severe atrophy, chronic microvascular ischemic  change, and remote large vessel infarct. No acute intracranial  findings are evident.   Wenda Low, MD 01/19/2013, 9:37 AM PGY-3, [facility] FPTS Intern pager: [phone], text pages welcome

## 2013-01-19 NOTE — Progress Notes (Signed)
Palliative Care Team at [facility] Progress Note   SUBJECTIVE: The patient is a 78 yo W, history of dementia (Alzheimer's vs Vascular dementia reported in prior notes) with hemiplegia and aphasia, DM2, HTN, CKD stage 2, sacral ulcers, prior DVT, presenting 11/1 with AMS, thought to be secondary to UTI (UA positive though Ucx neg), anemia (Hb dropped to 5.4 from 7.5 on Xarelto and Aggrenox), and AKI (Cr peaked at 2.7).  Today, the patient is sitting in bed, drinking orange juice.  She intermittently nods her head in response to verbal questions, though answer are not consistent.  No verbal responses.  Pt does not appear to be in pain.  Hb has remained stable overnight (7.7 this AM, 7.6 yesterday) after re-adding aggrenox and Xarelto yesterday.  Plan for discharge today back to SNF, with palliative care.  OBJECTIVE: Vital Signs: BP 124/55  Pulse 101  Temp(Src) 98.9 F (37.2 C) (Oral)  Resp 20  Ht 5\' 6"  (1.676 m)  Wt 213 lb 8 oz (96.843 kg)  BMI 34.48 kg/m2  SpO2 100%   Intake and Output: 11/05 0701 - 11/06 0700 In: 840 [P.O.:840] Out: 1250 [Urine:1250]  Physical Exam: General: Vital signs reviewed and noted. Well-developed, well-nourished, in no acute distress; alert, cooperative throughout examination.  Head: Normocephalic, atraumatic.  Lungs:  Normal respiratory effort. Clear to auscultation BL without crackles or wheezes.  Heart: RRR. Grade III/VI systolic murmur  Abdomen:  BS normoactive. Soft, Nondistended, non-tender.  No masses or organomegaly.  Extremities: No pretibial edema.    Allergies  Allergen Reactions  . Penicillins Other (See Comments)    Per MAR  . Tetanus Toxoids Other (See Comments)    Per MAR  . Vicodin [Hydrocodone-Acetaminophen] Other (See Comments)    Per MAR    Medications: Scheduled Meds:  . antiseptic oral rinse  15 mL Mouth Rinse BID  . atorvastatin  40 mg Oral Daily  . ciprofloxacin  400 mg Intravenous Q12H  . dipyridamole-aspirin  1  capsule Oral BID  . donepezil  10 mg Oral QPM  . furosemide  20 mg Oral Daily  . insulin aspart  0-15 Units Subcutaneous TID WC  . insulin aspart  0-5 Units Subcutaneous QHS  . latanoprost  1 drop Both Eyes QHS  . rivaroxaban  20 mg Oral q1800  . venlafaxine XR  37.5 mg Oral Q breakfast    Continuous Infusions: . sodium chloride 1,000 mL (01/18/13 0959)    PRN Meds: metoprolol, senna-docusate, sodium chloride  Stool Softner: Senokot-S prn  Palliative Performance Scale: 40 %   Pain Present?: no   Labs: CBC    Component Value Date/Time   WBC 14.9* 01/19/2013 0505   WBC 5.2 05/24/2012   WBC 4.4 06/25/2006 1009   RBC 2.69* 01/19/2013 0505   RBC 4.28 06/25/2006 1009   HGB 7.7* 01/19/2013 0505   HGB 11.9 06/25/2006 1009   HCT 23.2* 01/19/2013 0505   HCT 35.9 06/25/2006 1009   PLT 219 01/19/2013 0505   PLT 206 06/25/2006 1009   MCV 86.2 01/19/2013 0505   MCV 83.8 06/25/2006 1009   MCH 28.6 01/19/2013 0505   MCH 27.8 06/25/2006 1009   MCHC 33.2 01/19/2013 0505   MCHC 33.1 06/25/2006 1009   RDW 16.4* 01/19/2013 0505   RDW 14.7* 06/25/2006 1009   LYMPHSABS 1.3 01/14/2013 1446   LYMPHSABS 1.3 06/25/2006 1009   MONOABS 0.9 01/14/2013 1446   MONOABS 0.4 06/25/2006 1009   EOSABS 0.1 01/14/2013 1446   EOSABS 0.1  06/25/2006 1009   BASOSABS 0.0 01/14/2013 1446   BASOSABS 0.0 06/25/2006 1009    CMET     Component Value Date/Time   NA 145 01/19/2013 0505   NA 147 05/24/2012   K 3.8 01/19/2013 0505   K 4.0 01/25/2012 1003   CL 115* 01/19/2013 0505   CL 111 01/25/2012 1003   CO2 18* 01/19/2013 0505   CO2 25 01/25/2012 1003   GLUCOSE 188* 01/19/2013 0505   BUN 42* 01/19/2013 0505   BUN 26* 05/24/2012   BUN 30 01/13/2012 1627   CREATININE 1.18* 01/19/2013 0505   CREATININE 0.7 05/24/2012   CREATININE 0.88 01/25/2012 1003   CALCIUM 8.1* 01/19/2013 0505   CALCIUM 8.4 01/25/2012 1003   PROT 6.9 01/14/2013 1446   ALBUMIN 2.8* 01/14/2013 1446   AST 86* 01/14/2013 1446   ALT 81* 01/14/2013 1446   ALKPHOS 67  01/14/2013 1446   BILITOT 0.8 01/14/2013 1446   GFRNONAA 44* 01/19/2013 0505   GFRAA 51* 01/19/2013 0505     Imaging: Chest Xray Reviewed/ Impressions: CXR from 11/5 due to SOB.  Pt position suboptimal, image quality poor, vascular congestion without frank edema  CT scan of the Head Reviewed/Impressions: Severe atrophy, chronic microvascular ischemic changes, old CVA.   ASSESSMENT/ PLAN: The patient is a 78 yo woman, history of dementia, presenting with AMS 2/2 ?UTI, anemia, and AKI, with improvement since admission.  # Acute Delirium - mental status has improved since admission, though patient is still non-verbal, and gives consistent head nods to questions (unclear if this is patient's baseline).  Likely secondary to UTI, anemia, and AKI, all of which are being addressed. -appears to have completely or very nearly resolved  # Dementia - per chart review, alternatingly referred to as Alzheimer's vs Vascular dementia.  CT of the head showed significant microvascular ischemic changes, supporting vascular dementia, though multifactorial etiology is a possibility.  Per goals of care discussion, pt's daughter wants full care and full code, though would like palliative to follow at SNF. -plan for d/c to SNF, likely today per primary team -will need to continue to address end-of-life wishes with daughter, as patient's condition progresses -continue aricept  # Anemia - drop in Hb from 7.5 to 5.4 on admission, in the setting of anticoagulation with Xarelto, as well as use of Aggrenox.  Hb stable after 2 U pRBC's.  Aggrenox and Xarelto restarted last night, with stable Hb. -per primary team  # UTI - initial UA showed mild pyuria, though urine culture negative.  Pt would likely benefit from completing course of cipro.  # AKI - Cr peaked at 2.7, though now is back down to 1.18 after re-hydration.  Etiology likely prerenal secondary to poor PO intake.  SLP has evaluated, previously recommended dys 1  with thin liquids, but per note 11/5 recommends consideration for full liquid diet.  # Goals of care - per discussion with daughter 11/4, pt wants full code and full care.  Palliative to follow at SNF (pt has medicare and medicaid).   Time In: 8:45 Time Out: 8:55 Total Time Spent with Patient: 10 mins Total Overall Time: 30 mins   Greater than 50%  of this time was spent counseling and coordinating care related to the above assessment and plan.   Linward Headland, MD  01/19/2013, 8:54 AM  Please contact Palliative Medicine Team phone at [phone] for questions and concerns.

## 2013-01-19 NOTE — Progress Notes (Signed)
Patient is discharged from room 4N22 at this time. Unable to give report to [facility] as Clinical research associate was transferred several times and no one would take report. Left facility via stretcher by PTAR. In stable condition and rt central line d/c'd at discharge. Foley cath also d/c'd.

## 2013-01-20 ENCOUNTER — Inpatient Hospital Stay (HOSPITAL_COMMUNITY): Payer: Medicare Other

## 2013-01-20 ENCOUNTER — Inpatient Hospital Stay (HOSPITAL_COMMUNITY)
Admission: AD | Admit: 2013-01-20 | Discharge: 2013-01-25 | DRG: 559 | Disposition: A | Discharge status: Skilled Nursing Facility | Payer: Medicare Other | Source: Ambulatory Visit | Attending: Family Medicine | Admitting: Family Medicine

## 2013-01-20 ENCOUNTER — Encounter (HOSPITAL_COMMUNITY): Payer: Self-pay | Admitting: General Practice

## 2013-01-20 DIAGNOSIS — L89899 Pressure ulcer of other site, unspecified stage: Secondary | ICD-10-CM | POA: Diagnosis present

## 2013-01-20 DIAGNOSIS — I350 Nonrheumatic aortic (valve) stenosis: Secondary | ICD-10-CM | POA: Diagnosis present

## 2013-01-20 DIAGNOSIS — Z96649 Presence of unspecified artificial hip joint: Secondary | ICD-10-CM

## 2013-01-20 DIAGNOSIS — Y831 Surgical operation with implant of artificial internal device as the cause of abnormal reaction of the patient, or of later complication, without mention of misadventure at the time of the procedure: Secondary | ICD-10-CM | POA: Diagnosis present

## 2013-01-20 DIAGNOSIS — L8990 Pressure ulcer of unspecified site, unspecified stage: Secondary | ICD-10-CM | POA: Diagnosis present

## 2013-01-20 DIAGNOSIS — E559 Vitamin D deficiency, unspecified: Secondary | ICD-10-CM

## 2013-01-20 DIAGNOSIS — D649 Anemia, unspecified: Secondary | ICD-10-CM | POA: Diagnosis present

## 2013-01-20 DIAGNOSIS — F0280 Dementia in other diseases classified elsewhere without behavioral disturbance: Secondary | ICD-10-CM | POA: Diagnosis present

## 2013-01-20 DIAGNOSIS — I129 Hypertensive chronic kidney disease with stage 1 through stage 4 chronic kidney disease, or unspecified chronic kidney disease: Secondary | ICD-10-CM | POA: Diagnosis present

## 2013-01-20 DIAGNOSIS — F329 Major depressive disorder, single episode, unspecified: Secondary | ICD-10-CM | POA: Diagnosis present

## 2013-01-20 DIAGNOSIS — L259 Unspecified contact dermatitis, unspecified cause: Secondary | ICD-10-CM | POA: Diagnosis present

## 2013-01-20 DIAGNOSIS — N182 Chronic kidney disease, stage 2 (mild): Secondary | ICD-10-CM | POA: Diagnosis present

## 2013-01-20 DIAGNOSIS — I69998 Other sequelae following unspecified cerebrovascular disease: Secondary | ICD-10-CM

## 2013-01-20 DIAGNOSIS — I359 Nonrheumatic aortic valve disorder, unspecified: Secondary | ICD-10-CM | POA: Diagnosis present

## 2013-01-20 DIAGNOSIS — I959 Hypotension, unspecified: Secondary | ICD-10-CM | POA: Diagnosis present

## 2013-01-20 DIAGNOSIS — T84049A Periprosthetic fracture around unspecified internal prosthetic joint, initial encounter: Principal | ICD-10-CM | POA: Diagnosis present

## 2013-01-20 DIAGNOSIS — N390 Urinary tract infection, site not specified: Secondary | ICD-10-CM | POA: Diagnosis present

## 2013-01-20 DIAGNOSIS — L8993 Pressure ulcer of unspecified site, stage 3: Secondary | ICD-10-CM | POA: Diagnosis present

## 2013-01-20 DIAGNOSIS — G309 Alzheimer's disease, unspecified: Secondary | ICD-10-CM | POA: Diagnosis present

## 2013-01-20 DIAGNOSIS — I82509 Chronic embolism and thrombosis of unspecified deep veins of unspecified lower extremity: Secondary | ICD-10-CM | POA: Diagnosis present

## 2013-01-20 DIAGNOSIS — I69920 Aphasia following unspecified cerebrovascular disease: Secondary | ICD-10-CM

## 2013-01-20 DIAGNOSIS — J45909 Unspecified asthma, uncomplicated: Secondary | ICD-10-CM | POA: Diagnosis present

## 2013-01-20 DIAGNOSIS — L89159 Pressure ulcer of sacral region, unspecified stage: Secondary | ICD-10-CM

## 2013-01-20 DIAGNOSIS — S7291XA Unspecified fracture of right femur, initial encounter for closed fracture: Secondary | ICD-10-CM

## 2013-01-20 DIAGNOSIS — H409 Unspecified glaucoma: Secondary | ICD-10-CM | POA: Diagnosis present

## 2013-01-20 DIAGNOSIS — I672 Cerebral atherosclerosis: Secondary | ICD-10-CM | POA: Diagnosis present

## 2013-01-20 DIAGNOSIS — Z7901 Long term (current) use of anticoagulants: Secondary | ICD-10-CM

## 2013-01-20 DIAGNOSIS — Z96659 Presence of unspecified artificial knee joint: Secondary | ICD-10-CM

## 2013-01-20 DIAGNOSIS — Z7401 Bed confinement status: Secondary | ICD-10-CM

## 2013-01-20 DIAGNOSIS — L89109 Pressure ulcer of unspecified part of back, unspecified stage: Secondary | ICD-10-CM | POA: Diagnosis present

## 2013-01-20 DIAGNOSIS — L89309 Pressure ulcer of unspecified buttock, unspecified stage: Secondary | ICD-10-CM | POA: Diagnosis present

## 2013-01-20 DIAGNOSIS — E785 Hyperlipidemia, unspecified: Secondary | ICD-10-CM | POA: Diagnosis present

## 2013-01-20 DIAGNOSIS — E870 Hyperosmolality and hypernatremia: Secondary | ICD-10-CM | POA: Diagnosis present

## 2013-01-20 DIAGNOSIS — E119 Type 2 diabetes mellitus without complications: Secondary | ICD-10-CM | POA: Diagnosis present

## 2013-01-20 DIAGNOSIS — E669 Obesity, unspecified: Secondary | ICD-10-CM

## 2013-01-20 DIAGNOSIS — T84099A Other mechanical complication of unspecified internal joint prosthesis, initial encounter: Secondary | ICD-10-CM | POA: Diagnosis present

## 2013-01-20 DIAGNOSIS — I509 Heart failure, unspecified: Secondary | ICD-10-CM | POA: Diagnosis present

## 2013-01-20 DIAGNOSIS — S7290XA Unspecified fracture of unspecified femur, initial encounter for closed fracture: Secondary | ICD-10-CM

## 2013-01-20 DIAGNOSIS — L89154 Pressure ulcer of sacral region, stage 4: Secondary | ICD-10-CM | POA: Diagnosis present

## 2013-01-20 DIAGNOSIS — I639 Cerebral infarction, unspecified: Secondary | ICD-10-CM

## 2013-01-20 DIAGNOSIS — F32A Depression, unspecified: Secondary | ICD-10-CM | POA: Diagnosis present

## 2013-01-20 DIAGNOSIS — Z86718 Personal history of other venous thrombosis and embolism: Secondary | ICD-10-CM

## 2013-01-20 DIAGNOSIS — F0150 Vascular dementia without behavioral disturbance: Secondary | ICD-10-CM | POA: Diagnosis present

## 2013-01-20 HISTORY — DX: Malignant neoplasm of unspecified site of unspecified female breast: C50.919

## 2013-01-20 HISTORY — DX: Anemia, unspecified: D64.9

## 2013-01-20 HISTORY — DX: Shortness of breath: R06.02

## 2013-01-20 HISTORY — DX: Personal history of other medical treatment: Z92.89

## 2013-01-20 HISTORY — DX: Acute embolism and thrombosis of unspecified deep veins of unspecified lower extremity: I82.409

## 2013-01-20 HISTORY — DX: Urinary tract infection, site not specified: N39.0

## 2013-01-20 HISTORY — DX: Type 2 diabetes mellitus without complications: E11.9

## 2013-01-20 HISTORY — DX: Pneumonia, unspecified organism: J18.9

## 2013-01-20 LAB — GLUCOSE, CAPILLARY
Glucose-Capillary: 156 mg/dL — ABNORMAL HIGH (ref 70–99)
Glucose-Capillary: 131 mg/dL — ABNORMAL HIGH (ref 70–99)

## 2013-01-20 LAB — COMPREHENSIVE METABOLIC PANEL
ALT: 98 U/L — ABNORMAL HIGH (ref 0–35)
AST: 61 U/L — ABNORMAL HIGH (ref 0–37)
Alkaline Phosphatase: 119 U/L — ABNORMAL HIGH (ref 39–117)
CO2: 20 mEq/L (ref 19–32)
Chloride: 116 mEq/L — ABNORMAL HIGH (ref 96–112)
Creatinine, Ser: 1.14 mg/dL — ABNORMAL HIGH (ref 0.50–1.10)
GFR calc Af Amer: 53 mL/min — ABNORMAL LOW (ref >90)
GFR calc non Af Amer: 46 mL/min — ABNORMAL LOW (ref >90)
Glucose, Bld: 170 mg/dL — ABNORMAL HIGH (ref 70–99)
Sodium: 146 mEq/L — ABNORMAL HIGH (ref 135–145)
Total Bilirubin: 1.1 mg/dL (ref 0.3–1.2)

## 2013-01-20 LAB — CBC WITH DIFFERENTIAL/PLATELET
Basophils Absolute: 0 10*3/uL (ref 0.0–0.1)
Eosinophils Relative: 3 % (ref 0–5)
HCT: 24.4 % — ABNORMAL LOW (ref 36.0–46.0)
Hemoglobin: 7.9 g/dL — ABNORMAL LOW (ref 12.0–15.0)
Lymphocytes Relative: 13 % (ref 12–46)
Lymphs Abs: 1.8 10*3/uL (ref 0.7–4.0)
MCH: 27.9 pg (ref 26.0–34.0)
MCV: 86.2 fL (ref 78.0–100.0)
Monocytes Absolute: 0.8 10*3/uL (ref 0.1–1.0)
Neutro Abs: 10.6 10*3/uL — ABNORMAL HIGH (ref 1.7–7.7)
Platelets: 261 10*3/uL (ref 150–400)
RBC: 2.83 MIL/uL — ABNORMAL LOW (ref 3.87–5.11)
RDW: 16.4 % — ABNORMAL HIGH (ref 11.5–15.5)
WBC: 13.7 10*3/uL — ABNORMAL HIGH (ref 4.0–10.5)

## 2013-01-20 LAB — PROTIME-INR: INR: 1.25 (ref 0.00–1.49)

## 2013-01-20 MED ORDER — FLUTICASONE PROPIONATE 50 MCG/ACT NA SUSP
2.0000 | Freq: Every day | NASAL | Status: DC
  Administered 2013-01-20 – 2013-01-25 (×6): 2 via NASAL
  Filled 2013-01-20: qty 16

## 2013-01-20 MED ORDER — POLYETHYLENE GLYCOL 3350 17 G PO PACK: 17.0000 g | PACK | Freq: Every day | ORAL | Status: DC | PRN

## 2013-01-20 MED ORDER — SODIUM CHLORIDE 0.9 % IJ SOLN: 3.0000 mL | INTRAMUSCULAR | Status: DC | PRN

## 2013-01-20 MED ORDER — DONEPEZIL HCL 10 MG PO TABS
10.0000 mg | ORAL_TABLET | Freq: Every evening | ORAL | Status: DC
  Administered 2013-01-20 – 2013-01-25 (×5): 10 mg via ORAL
  Filled 2013-01-20 (×7): qty 1

## 2013-01-20 MED ORDER — ACETAMINOPHEN 160 MG/5ML PO SOLN
650.0000 mg | Freq: Four times a day (QID) | ORAL | Status: DC | PRN
  Administered 2013-01-20: 650 mg via ORAL
  Filled 2013-01-20: qty 20.3

## 2013-01-20 MED ORDER — FAMOTIDINE 20 MG PO TABS
20.0000 mg | ORAL_TABLET | Freq: Every day | ORAL | Status: DC
  Administered 2013-01-21 – 2013-01-25 (×5): 20 mg via ORAL
  Filled 2013-01-20 (×5): qty 1

## 2013-01-20 MED ORDER — BARRIER CREAM NON-SPECIFIED
1.0000 "application " | TOPICAL_CREAM | Freq: Three times a day (TID) | TOPICAL | Status: DC
  Administered 2013-01-20 – 2013-01-25 (×12): 1 via TOPICAL
  Filled 2013-01-20: qty 1

## 2013-01-20 MED ORDER — VITAMIN D (ERGOCALCIFEROL) 1.25 MG (50000 UNIT) PO CAPS
50000.0000 [IU] | ORAL_CAPSULE | ORAL | Status: DC
  Administered 2013-01-21: 50000 [IU] via ORAL
  Filled 2013-01-20: qty 1

## 2013-01-20 MED ORDER — OXYCODONE HCL 5 MG PO TABS
5.0000 mg | ORAL_TABLET | ORAL | Status: DC | PRN
  Administered 2013-01-21: 5 mg via ORAL
  Filled 2013-01-20: qty 1

## 2013-01-20 MED ORDER — SODIUM CHLORIDE 0.9 % IV SOLN: 250.0000 mL | INTRAVENOUS | Status: DC | PRN

## 2013-01-20 MED ORDER — MORPHINE SULFATE 2 MG/ML IJ SOLN
1.0000 mg | INTRAMUSCULAR | Status: DC | PRN
  Administered 2013-01-20 – 2013-01-22 (×4): 1 mg via INTRAVENOUS
  Filled 2013-01-20 (×5): qty 1

## 2013-01-20 MED ORDER — LATANOPROST 0.005 % OP SOLN
1.0000 [drp] | Freq: Every day | OPHTHALMIC | Status: DC
  Administered 2013-01-20 – 2013-01-24 (×5): 1 [drp] via OPHTHALMIC
  Filled 2013-01-20: qty 2.5

## 2013-01-20 MED ORDER — CIPROFLOXACIN HCL 500 MG PO TABS
500.0000 mg | ORAL_TABLET | Freq: Two times a day (BID) | ORAL | Status: DC
  Administered 2013-01-20 – 2013-01-21 (×3): 500 mg via ORAL
  Filled 2013-01-20 (×5): qty 1

## 2013-01-20 MED ORDER — PROMETHAZINE HCL 25 MG PO TABS: 25.0000 mg | ORAL_TABLET | Freq: Four times a day (QID) | ORAL | Status: DC | PRN

## 2013-01-20 MED ORDER — ENOXAPARIN SODIUM 40 MG/0.4ML ~~LOC~~ SOLN
40.0000 mg | SUBCUTANEOUS | Status: DC
  Administered 2013-01-20 – 2013-01-25 (×6): 40 mg via SUBCUTANEOUS
  Filled 2013-01-20 (×6): qty 0.4

## 2013-01-20 MED ORDER — FOLIC ACID-VIT B6-VIT B12 2.5-25-1 MG PO TABS
1.0000 | ORAL_TABLET | Freq: Every day | ORAL | Status: DC
  Administered 2013-01-21 – 2013-01-24 (×4): 1 via ORAL
  Filled 2013-01-20 (×6): qty 1

## 2013-01-20 MED ORDER — CALCIUM CITRATE 950 (200 CA) MG PO TABS
1.0000 | ORAL_TABLET | Freq: Every day | ORAL | Status: DC
  Administered 2013-01-21 – 2013-01-25 (×5): 200 mg via ORAL
  Filled 2013-01-20 (×6): qty 1

## 2013-01-20 MED ORDER — ACETAMINOPHEN 325 MG PO TABS
650.0000 mg | ORAL_TABLET | Freq: Four times a day (QID) | ORAL | Status: DC | PRN
  Administered 2013-01-22 – 2013-01-25 (×2): 650 mg via ORAL
  Filled 2013-01-20 (×2): qty 2

## 2013-01-20 MED ORDER — SODIUM CHLORIDE 0.9 % IJ SOLN
3.0000 mL | Freq: Two times a day (BID) | INTRAMUSCULAR | Status: DC
  Administered 2013-01-20: 3 mL via INTRAVENOUS

## 2013-01-20 MED ORDER — METOPROLOL SUCCINATE 12.5 MG HALF TABLET
12.5000 mg | ORAL_TABLET | Freq: Every day | ORAL | Status: DC
  Administered 2013-01-21 – 2013-01-25 (×5): 12.5 mg via ORAL
  Filled 2013-01-20 (×6): qty 1

## 2013-01-20 MED ORDER — VENLAFAXINE HCL ER 75 MG PO CP24
75.0000 mg | ORAL_CAPSULE | Freq: Every day | ORAL | Status: DC
  Administered 2013-01-21 – 2013-01-25 (×5): 75 mg via ORAL
  Filled 2013-01-20 (×6): qty 1

## 2013-01-20 MED ORDER — POLYETHYLENE GLYCOL 3350 17 GM/SCOOP PO POWD: 17.0000 g | Freq: Every day | ORAL | Status: DC | PRN

## 2013-01-20 MED ORDER — ATORVASTATIN CALCIUM 40 MG PO TABS
40.0000 mg | ORAL_TABLET | Freq: Every evening | ORAL | Status: DC
  Administered 2013-01-20 – 2013-01-25 (×5): 40 mg via ORAL
  Filled 2013-01-20 (×7): qty 1

## 2013-01-20 NOTE — H&P (Signed)
Family Medicine Teaching [facility] Admission History and Physical Service Pager: [phone]  Patient name: [name] Medical record number: [id number] Date of birth: 22-Nov-1934 Age: 78 y.o. Gender: female  Primary Care Provider: Tana Conch, MD Consultants: Orthopedics, wound care, palliative medicine Code Status: Full (based on goals-of-care meeting at previous admission)  Chief Complaint: R femur fracture  Assessment and Plan: [name] is a 78 y.o. female presenting with right femur fracture. PMH is significant for ischemic CVAs, vascular dementia, aphasia, history of DVTs, and recent admission for AMS, anemia, and UTI.   Femur fracture as reported on outside imaging. Unknown cause. Notes on discharge exam from hospital yesterday note no edema on lower extremities.   - XR bilateral knees and hips - Orthopedics consult following imaging - CBC, BMP in AM - Pain control: Will try Tylenol 650mg  q6h prn pain. Documented vicodin allergy, would try morphine if pain isn't controlled.  - History of DVT: Hold anti-coagulation. VSS currently. Low threshold for LE dopplers. SCD for unaffected leg.  - Ca and Vit D  Urinary tract infection treated with ciprofloxacin, to be continued until 11/8, per most recent discharge summary.  - Checking renal function - Continue cipro x2 days  CHF:  - Hold lasix and lisinopril for low BP. Continue low-dose beta-blocker, lipitor  History of CVA, vascular dementia, aphasia - Aricept, venlafaxine - Palliative care team involved in previous admission, and family interested in continuing care, so will consult them.   History of sacral decubitus ulcer - Continue heel protectors - Barrier cream - Will consult wound care  FEN/GI: NPO pending imaging and decision on operation,  Prophylaxis: Holding xarelto and aggrenox. SCD on unaffected LE; Pepcid  Disposition: Admit to FMTS on telemetry floor, attending Dr. Mauricio Po. Orthopedics consult pending  imaging.   History of Present Illness: [name] is a 78 y.o. female presenting with reports of right femur fracture on imaging at [facility] nursing home.   Level V caveat applies, as pt has dementia and aphasia.   She was discharged yesterday to [facility] after 6-day admission for altered mental status, treatment of UTI and anemia. This morning a NP at [facility] found ecchymosis on patient's right knee and ordered an x-ray which revealed a femur fracture.   Review Of Systems: Per HPI Otherwise 12 point review of systems was performed and was unremarkable.  Patient Active Problem List   Diagnosis Date Noted  . Stroke 01/14/2013  . UTI (lower urinary tract infection) 01/14/2013  . Acute on chronic kidney failure 01/14/2013  . Anemia 05/25/2012  . DVT (deep venous thrombosis) 05/12/2012  . Mitral stenosis 02/15/2012  . Sacral pressure sore 02/15/2012  . At risk for diabetes mellitus 12/27/2011  . Hypertension 12/27/2011  . Aortic stenosis 12/11/2011  . CKD (chronic kidney disease) stage 2, GFR 60-89 ml/min 12/11/2011  . Breast neoplasm   . Glaucoma   . Vitamin D deficiency   . Hiatal hernia   . Obesity   . Hyperlipidemia   . Vascular dementia   . Depression   . Allergic rhinitis    Past Medical History: Past Medical History  Diagnosis Date  . Left hemiplegia   . Aphasia   . Muscle weakness   . Diabetes mellitus   . Malaise   . Fatigue   . Breast neoplasm   . Hypertension   . Glaucoma   . UTI (lower urinary tract infection)   . Vitamin D deficiency   . Hiatal hernia   .  Constipation   . Diarrhea   . Asthma   . Obesity   . Hyperlipidemia   . Alzheimer's dementia   . Depression   . Allergic rhinitis   . Dementia   . Stroke     infarct    Past Surgical History: Past Surgical History  Procedure Laterality Date  . Joint replacement      left knee in '90s  . Breast surgery      lumpectomy x2   Social History: History  Substance Use Topics  . Smoking  status: Never Smoker   . Smokeless tobacco: Not on file  . Alcohol Use: No   Please also refer to relevant sections of EMR.  Family History: No family history on file. Allergies and Medications: Allergies  Allergen Reactions  . Penicillins Other (See Comments)    Per MAR  . Tetanus Toxoids Other (See Comments)    Per MAR  . Vicodin [Hydrocodone-Acetaminophen] Other (See Comments)    Per MAR   No current facility-administered medications on file prior to encounter.   Current Outpatient Prescriptions on File Prior to Encounter  Medication Sig Dispense Refill  . atorvastatin (LIPITOR) 40 MG tablet Take 40 mg by mouth every evening.      . barrier cream (NON-SPECIFIED) CREA Apply 1 application topically 3 (three) times daily. To bottom after diaper change      . calcium citrate (CALCITRATE - DOSED IN MG ELEMENTAL CALCIUM) 950 MG tablet Take 1 tablet by mouth daily.      . Cranberry-Vitamin C-Inulin (UTI-STAT) LIQD Take 30 mLs by mouth daily.      Marland Kitchen dipyridamole-aspirin (AGGRENOX) 200-25 MG per 12 hr capsule Take 1 capsule by mouth 2 (two) times daily.      Marland Kitchen donepezil (ARICEPT) 10 MG tablet Take 10 mg by mouth every evening.      . fluticasone (FLONASE) 50 MCG/ACT nasal spray Place 2 sprays into the nose daily.      . Folic Acid-Vit B6-Vit B12 (FOLBEE) 2.5-25-1 MG TABS Take 1 tablet by mouth daily.      . furosemide (LASIX) 20 MG tablet Take 1 tablet (20 mg total) by mouth daily.  30 tablet  0  . lisinopril (ZESTRIL) 2.5 MG tablet Take 1 tablet (2.5 mg total) by mouth daily.      . metoprolol succinate (TOPROL-XL) 12.5 mg TB24 24 hr tablet Take 0.5 tablets (12.5 mg total) by mouth daily.  30 tablet  0  . Multiple Vitamin (MULTIVITAMIN WITH MINERALS) TABS tablet Take 1 tablet by mouth daily.      . polyethylene glycol powder (GLYCOLAX/MIRALAX) powder Take 17 g by mouth daily as needed. FOR CONSTIPATION      . promethazine (PHENERGAN) 25 MG tablet Take 25 mg by mouth every 6 (six) hours  as needed for nausea.      . ranitidine (ZANTAC) 150 MG tablet Take 150 mg by mouth every evening.      . Rivaroxaban (XARELTO) 20 MG TABS tablet Take 1 tablet (20 mg total) by mouth daily.  30 tablet    . Travoprost, BAK Free, (TRAVATAN) 0.004 % SOLN ophthalmic solution Place 1 drop into both eyes at bedtime.      Marland Kitchen venlafaxine XR (EFFEXOR-XR) 75 MG 24 hr capsule Take 75 mg by mouth daily.      . Vitamin D, Ergocalciferol, (DRISDOL) 50000 UNITS CAPS Take 50,000 Units by mouth every 30 (thirty) days. Give q 15th day of each month      .  ciprofloxacin (CIPRO) 500 MG tablet Take 1 tablet (500 mg total) by mouth 2 (two) times daily.  6 tablet  0    Objective: BP 109/67  Pulse 90  Temp(Src) 98.8 F (37.1 C)  Resp 24  SpO2 100% Exam: General: Awake, obese 78 yo female in some pain HEENT: Pupils small but reactive, poor dentition with greenish discoloration around mouth Cardiovascular: RRR, II/VI early systolic murmur Respiratory: Non-labored, CTAB, decreased due to body habitus Abdomen: +BS, soft, obese, NT, ND Extremities: Warm and well-perfused, LLE externally rotated, RLE internally rotated with valgus deformity of knee, marked edema and lateral/dependent red ecchymosis. 2+ DP pulses. Feet in protective boots bilaterally.  Skin: intact overlying R leg deformity Neuro: Alert and follows verbal commands, no vocal response on this exam, but shakes head in affirmative to questions.   Labs and Imaging: CBC BMET   Recent Labs Lab 01/19/13 0505  WBC 14.9*  HGB 7.7*  HCT 23.2*  PLT 219    Recent Labs Lab 01/19/13 0505  NA 145  K 3.8  CL 115*  CO2 18*  BUN 42*  CREATININE 1.18*  GLUCOSE 188*  CALCIUM 8.1*     RLE XR pending  Hazeline Junker, MD 01/20/2013, 3:07 PM PGY-1, [facility] Family Medicine FPTS Intern pager: [phone], text pages welcome

## 2013-01-20 NOTE — Progress Notes (Signed)
FMTS Attending Note Patient seen and examined by me on 6N04, she was transferred this morning from [facility] after nurse practitioner at SNF noticed ecchymosis along R knee and patient appeared in distress. XR done at SNF with report of R femur fracture.  Team arranged for direct-admit from SNF to [facility].  EXAM: At my assessment Ms. Schnake is awake and alert, grimacing.  Able to answer "yes" to my questions re pain.  Answers in the affirmative for all questions. Neck supple.  COR Mild tachycardia; S1S2 with systolic M. PULM distant breath sounds ABD soft, not tender.  EXTS: Markedly inverted R leg, edema along R knee with notable ecchymosis along lateral aspect of R knee.  Grimaces with gentle palpation over R knee and femur.  Palpable dp pulses bilaterally; warm distal LEs.   A/P: Patient recently hospitalized (discharged yesterday) after mental status change and precipitous drop in Hgb; not determined to have suffered stroke by MRI; she has been on anticoagulation for DVT prophylaxis (not stroke prevention).  I had seen her in the hospital during this most recent hospitalization and she appeared comfortable while sitting up in bed as recently as yesterday.  At this point, plan for x-rays both knees and hips; consult to Orthopedics once x-rays performed.  CBC, CMet. Low threshold for evaluation for PE in setting of prior DVTs and now with fx.  History is limited due to patient's dementia and lack of detailed verbal responsiveness.    I spoke with patient's sister and medical POA Colvin Caroli (tel [phone]) about patient's status and plans for initial workup.  She estimates her arrival in hospital around 2:15pm.  Paula Compton, MD

## 2013-01-20 NOTE — Consult Note (Signed)
[name] is an 78 y.o. female.    Chief Complaint: right leg ecchymosis and x-ray report of fracture  HPI: 78 y/o aphasic female with dementia presents with ecchymosis to right thigh/knee hours after being discharged from the hospital for altered mental status. Pt admitted from 01/14/13 to 01/19/13. After discharge a nurse at SNF noted ecchymosis to right lower leg and ordered films. Outside x-rays demonstrating right distal femur fracture with displaced patellar prosthesis. History of bilateral total knee replacements from unknown provider. Will try to contact sister tonight in regards to her care.Pt currently being treated for sacral decubitus and timing of fracture is unknown. Palliative care consulted on her last inpatient visit due to her comorbidities. Ambulatory status is unknown due to level 5 caveat.   PCP:  Carla Conch, MD  PMH: Past Medical History  Diagnosis Date  . Aphasia   . Muscle weakness   . Malaise   . Fatigue   . Breast neoplasm   . Hypertension   . Glaucoma   . UTI (lower urinary tract infection)   . Vitamin D deficiency   . Hiatal hernia   . Constipation   . Diarrhea   . Asthma   . Obesity   . Hyperlipidemia   . Alzheimer's dementia   . Depression   . Allergic rhinitis   . Dementia   . DVT (deep venous thrombosis) 2014    "put her on Xeralto" (01/20/2013)  . Pneumonia 2014  . Shortness of breath     "lately; just laying there" (01/20/2013)  . Type II diabetes mellitus   . Anemia   . History of blood transfusion     "got a unit yesterday" (01/20/2013)  . Stroke 2006; 2007    residual:  "mute, dementia, generalized weakness" (01/20/2013)  . Recurrent UTI     "at least 3 since 2008 when she went into nursing home" (01/20/2013)  . Breast cancer     "right" (01/20/2013)    PSH: Past Surgical History  Procedure Laterality Date  . Joint replacement      left knee in '90s  . Breast lumpectomy Right 1990's; 2000's    "she's had 2" (01/20/2013)  .  Replacement total knee bilateral Bilateral 1990's    Social History:  reports that she has never smoked. She has never used smokeless tobacco. She reports that she does not drink alcohol or use illicit drugs.  Allergies:  Allergies  Allergen Reactions  . Penicillins Other (See Comments)    Per MAR  . Tetanus Toxoids Other (See Comments)    Per MAR  . Vicodin [Hydrocodone-Acetaminophen] Other (See Comments)    Per MAR    Medications: Current Facility-Administered Medications  Medication Dose Route Frequency Provider Last Rate Last Dose  . 0.9 %  sodium chloride infusion  250 mL Intravenous PRN Barbaraann Barthel, MD      . acetaminophen (TYLENOL) solution 650 mg  650 mg Oral Q6H PRN Barbaraann Barthel, MD   650 mg at 01/20/13 1519  . acetaminophen (TYLENOL) tablet 650 mg  650 mg Oral Q6H PRN Barbaraann Barthel, MD      . atorvastatin (LIPITOR) tablet 40 mg  40 mg Oral QPM Glori Luis, MD   40 mg at 01/20/13 1806  . barrier cream (non-specified) 1 application  1 application Topical TID Glori Luis, MD   1 application at 01/20/13 1753  . calcium citrate (CALCITRATE - dosed in mg elemental calcium) tablet 200 mg of  elemental calcium  1 tablet Oral Daily Glori Luis, MD      . ciprofloxacin (CIPRO) tablet 500 mg  500 mg Oral BID Glori Luis, MD   500 mg at 01/20/13 2055  . donepezil (ARICEPT) tablet 10 mg  10 mg Oral QPM Glori Luis, MD   10 mg at 01/20/13 1806  . enoxaparin (LOVENOX) injection 40 mg  40 mg Subcutaneous Q24H Glori Luis, MD   40 mg at 01/20/13 1810  . famotidine (PEPCID) tablet 20 mg  20 mg Oral Daily Glori Luis, MD      . fluticasone [facility]) 50 MCG/ACT nasal spray 2 spray  2 spray Each Nare Daily Glori Luis, MD   2 spray at 01/20/13 1806  . Folic Acid-Vit B6-Vit B12 (FOLBEE) tablet 1 tablet  1 tablet Oral Daily Glori Luis, MD      . latanoprost (XALATAN) 0.005 % ophthalmic solution 1 drop  1 drop Both Eyes QHS Glori Luis,  MD   1 drop at 01/20/13 2055  . metoprolol succinate (TOPROL-XL) 24 hr tablet 12.5 mg  12.5 mg Oral Daily Glori Luis, MD      . morphine 2 MG/ML injection 1 mg  1 mg Intravenous Q4H PRN Lonia Skinner, MD      . oxyCODONE (Oxy IR/ROXICODONE) immediate release tablet 5 mg  5 mg Oral Q3H PRN Lonia Skinner, MD      . polyethylene glycol (MIRALAX / GLYCOLAX) packet 17 g  17 g Oral Daily PRN Renaee Munda, RPH      . promethazine (PHENERGAN) tablet 25 mg  25 mg Oral Q6H PRN Glori Luis, MD      . sodium chloride 0.9 % injection 3 mL  3 mL Intravenous Q12H Barbaraann Barthel, MD   3 mL at 01/20/13 2058  . sodium chloride 0.9 % injection 3 mL  3 mL Intravenous PRN Barbaraann Barthel, MD      . venlafaxine XR (EFFEXOR-XR) 24 hr capsule 75 mg  75 mg Oral Daily Glori Luis, MD      . Melene Muller ON 01/21/2013] Vitamin D (Ergocalciferol) (DRISDOL) capsule 50,000 Units  50,000 Units Oral Q30 days Glori Luis, MD        Results for orders placed during the hospital encounter of 01/20/13 (from the past 48 hour(s))  CBC WITH DIFFERENTIAL     Status: Abnormal   Collection Time    01/20/13  3:27 PM      Result Value Range   WBC 13.7 (*) 4.0 - 10.5 K/uL   RBC 2.83 (*) 3.87 - 5.11 MIL/uL   Hemoglobin 7.9 (*) 12.0 - 15.0 g/dL   HCT 16.1 (*) 09.6 - 04.5 %   MCV 86.2  78.0 - 100.0 fL   MCH 27.9  26.0 - 34.0 pg   MCHC 32.4  30.0 - 36.0 g/dL   RDW 40.9 (*) 81.1 - 91.4 %   Platelets 261  150 - 400 K/uL   Neutrophils Relative % 78 (*) 43 - 77 %   Neutro Abs 10.6 (*) 1.7 - 7.7 K/uL   Lymphocytes Relative 13  12 - 46 %   Lymphs Abs 1.8  0.7 - 4.0 K/uL   Monocytes Relative 6  3 - 12 %   Monocytes Absolute 0.8  0.1 - 1.0 K/uL   Eosinophils Relative 3  0 - 5 %   Eosinophils Absolute 0.4  0.0 - 0.7  K/uL   Basophils Relative 0  0 - 1 %   Basophils Absolute 0.0  0.0 - 0.1 K/uL  COMPREHENSIVE METABOLIC PANEL     Status: Abnormal   Collection Time    01/20/13  3:27 PM      Result Value Range    Sodium 146 (*) 135 - 145 mEq/L   Potassium 4.2  3.5 - 5.1 mEq/L   Chloride 116 (*) 96 - 112 mEq/L   CO2 20  19 - 32 mEq/L   Glucose, Bld 170 (*) 70 - 99 mg/dL   BUN 39 (*) 6 - 23 mg/dL   Creatinine, Ser 1.61 (*) 0.50 - 1.10 mg/dL   Calcium 8.7  8.4 - 09.6 mg/dL   Total Protein 6.4  6.0 - 8.3 g/dL   Albumin 2.0 (*) 3.5 - 5.2 g/dL   AST 61 (*) 0 - 37 U/L   ALT 98 (*) 0 - 35 U/L   Alkaline Phosphatase 119 (*) 39 - 117 U/L   Total Bilirubin 1.1  0.3 - 1.2 mg/dL   GFR calc non Af Amer 46 (*) >90 mL/min   GFR calc Af Amer 53 (*) >90 mL/min   Comment: (NOTE)     The eGFR has been calculated using the CKD EPI equation.     This calculation has not been validated in all clinical situations.     eGFR's persistently <90 mL/min signify possible Chronic Kidney     Disease.  GLUCOSE, CAPILLARY     Status: Abnormal   Collection Time    01/20/13  5:27 PM      Result Value Range   Glucose-Capillary 156 (*) 70 - 99 mg/dL   Comment 1 Notify RN    PROTIME-INR     Status: Abnormal   Collection Time    01/20/13  7:30 PM      Result Value Range   Prothrombin Time 15.4 (*) 11.6 - 15.2 seconds   INR 1.25  0.00 - 1.49  GLUCOSE, CAPILLARY     Status: Abnormal   Collection Time    01/20/13  8:51 PM      Result Value Range   Glucose-Capillary 131 (*) 70 - 99 mg/dL   Dg Chest 2 View  06/18/4096   CLINICAL DATA:  Chest pain.  EXAM: CHEST  2 VIEW  COMPARISON:  January 18, 2013.  FINDINGS: The lungs remain mildly hypoinflated. The left hemidiaphragm is better demonstrated today. The central pulmonary vascularity remains engorged and the cardiac silhouette remains enlarged. There is calcification of the mitral valvular annulus. There is no discrete alveolar pneumonia. There is lucency in the left upper quadrant of the abdomen likely reflecting gas within the stomach or bowel. On the lateral film the posterior costophrenic angles are obscured suggesting small amounts of pleural fluid. The observed portions of  the bony thorax exhibit no acute abnormalities.  IMPRESSION: The findings suggest low-grade CHF. There is no focal pneumonia.   Electronically Signed   By: David  [place]   On: 01/19/2013 14:16   Dg Hip Bilateral W/pelvis  01/20/2013   CLINICAL DATA:  Ecchymosis right femur, pain  EXAM: BILATERAL HIP WITH PELVIS - 4+ VIEW  COMPARISON:  None  FINDINGS: The bones of the pelvis are intact. There is moderate bilateral hip arthritis. On the frontal view, there is abnormal contour to the medial right femoral neck and subcapital area. On lateral view however, this area shows an intact cortex with no evidence of fracture.  IMPRESSION: No  definite fractures. Abnormal appearing right subcapital femur on the frontal view appears to be due to a band of osteophytes. The lateral view does not confirm the presence of a fracture. If there is reasonable clinical suspicion for right femur fracture, a CT could be considered.   Electronically Signed   By: Esperanza Heir M.D.   On: 01/20/2013 21:05   Dg Femur Right  01/20/2013   CLINICAL DATA:  Bruising over right femur, pain  EXAM: RIGHT FEMUR - 2 VIEW  COMPARISON:  None.  FINDINGS: Significant osteophyte formation right hip joint. Right total knee replacement. There is an oblique fracture involving the distal 3rd of the femoral shaft. Fracture is mildly comminuted with apex medial angulation. Distal fracture fragment is displaced anteriorly by approximately 1/3 shaft with, and displaced laterally by about 1/2 shaft's with. There is calcified material in the suprapatellar recess of the joint.  IMPRESSION: Acute fracture distal femoral shaft. Irregularity of the subcapital medial femur appears to be more likely due to a band of osteophyte formation rather than acute fracture in this location.   Electronically Signed   By: Esperanza Heir M.D.   On: 01/20/2013 21:07   Dg Knee 1-2 Views Right  01/20/2013   CLINICAL DATA:  Fall, pain  EXAM: RIGHT KNEE - 1-2 VIEW  COMPARISON:   None.  FINDINGS: Total knee prosthesis. Calcified hematoma in the suprapatellar aspect of the joint. Oblique fracture distal 3rd of the femoral shaft described in full detail on report of the femur.  IMPRESSION: Comminuted displaced angulated distal femur fracture.   Electronically Signed   By: Esperanza Heir M.D.   On: 01/20/2013 21:08    ROS: ROS Pt is aphasic and suspected complete bedrest Hx of bilateral knee replacements Ecchymosis to posterior right knee  Physical Exam: Aphasic 78 y/o female with no response with verbal stimuli Bilateral upper extremities show full rom with no signs of deformity Pelvis is stable Prior incisions from bilateral total knee replacements Right lower leg: minimal ecchymosis to right lateral knee Deformity of the patellar prosthesis with moderate effusion Mild pedal edema bilaterally Exam of left knee is negative No other deformities or abrasions noted Physical Exam   Assessment/Plan Assessment: right distal femur fracture with hx of total knee repalcement  Plan: Pt has been readmitted by medicine team Will discuss this case with one of our total joint specialists and family of the patient to discuss whether surgical management is warranted in her case Pain control as needed Non weight bearing right lower extremity Entire case discussed with Dr. Ranell Patrick

## 2013-01-20 NOTE — H&P (Signed)
FMTS Attending Admit Note Patient seen and examined by me, please see my separate note for details.  I have discussed patient's care with resident team and I agree with Dr Jarvis Newcomer' assessment and plan as per this note.   The mechanism and timing of the patient's reported R femur fracture is unclear to me.  Based upon the appearance of the ecchymosis, it appears to be a recent event.  Awaiting further evaluation with x-ray before calling consult to Orthopedics.  Patient is at high risk for VTE and will need DVT prophylaxis now that her CBC is back and her Hgb remains stable from previous admission.  Paula Compton, MD

## 2013-01-20 NOTE — Progress Notes (Signed)
Family Practice Teaching Service Interval Progress Note  Went back to see the patient with Dr Mauricio Po. Patient evaluated for sacral decubitus ulcers that were previously known. Patient has 3x3 and 1.5x2 cm ulcerations. Nursing to reapply dressing to this area. Will have WOC see the patient while hospitalized. Please see H&P and attending note for further plan.  Marikay Alar, MD Family Medicine PGY-2 Service Pager [phone]

## 2013-01-20 NOTE — Progress Notes (Signed)
INITIAL NUTRITION ASSESSMENT  DOCUMENTATION CODES Per approved criteria  -Obesity Unspecified   INTERVENTION: 1.  Supplements; Ensure Pudding po TID, each supplement provides 170 kcal and 4 grams of protein.  2.  General healthful diet; assistance with meals as needed.  Encourage intake as able.  NUTRITION DIAGNOSIS: Increased nutrient needs related to healing as evidenced by injury to knee.   Monitor:  1.  Food/Beverage; pt meeting >/=90% estimated needs with tolerance. 2.  Wt/wt change; monitor trends  Reason for Assessment: MST  78 y.o. female  Admitting Dx: knee pain  ASSESSMENT: Pt admitted with ecchymosis along right knee.  Ortho consulted.  Pt was evalutated by SLP during most recent hospitalization who determined pt was appropriate for Dysphagia 1, thin liquid diet.  PO intake was overall variable during hospitalization. She was provided with several supplements.   Pt also had a Palliative Care meeting (11/4) and family desired full measures of care.   RD met with pt and family at bedside.  Family reports no changes since discharge yesterday. PO intake had been 5-100% of meals.  Pt is noted to be coughing with sips of liquid provided by family during visit.  Discussed with RN.   RD to follow. Current diet order is NPO pending surgical consult.   Nutrition Focused Physical Exam: Subcutaneous Fat:  Orbital Region: WNL Upper Arm Region: WNL Thoracic and Lumbar Region: WNL  Muscle:  Temple Region: WNL Clavicle Bone Region: WNL Clavicle and Acromion Bone Region: WNL Scapular Bone Region: WNL Dorsal Hand: WNL Patellar Region: not assessed Anterior Thigh Region: not assessed Posterior Calf Region: not assessed  Edema: none present  Height: Ht Readings from Last 1 Encounters:  01/14/13 5\' 6"  (1.676 m)    Weight: Wt Readings from Last 1 Encounters:  01/14/13 213 lb 8 oz (96.843 kg)  Most recent d/c wt.  No new admission wt.   Ideal Body Weight: 130 lbs  %  Ideal Body Weight: 163%  Wt Readings from Last 10 Encounters:  01/14/13 213 lb 8 oz (96.843 kg)  12/15/12 221 lb 9.6 oz (100.517 kg)  12/18/12 219 lb (99.338 kg)  10/30/12 217 lb (98.431 kg)  09/01/12 227 lb (102.967 kg)  07/27/12 223 lb 8 oz (101.379 kg)  06/22/12 226 lb 12.8 oz (102.876 kg)  05/12/12 226 lb 3.2 oz (102.604 kg)  03/27/12 228 lb (103.42 kg)  02/16/12 217 lb 12.8 oz (98.793 kg)    Usual Body Weight: 220 lbs  % Usual Body Weight: 96%  Estimated body mass index is 34.48 kg/(m^2) as calculated from the following:   Height as of 01/14/13: 5\' 6"  (1.676 m).   Weight as of 01/14/13: 213 lb 8 oz (96.843 kg).  Estimated Nutritional Needs: Kcal: 1680-1800 Protein: 77-90g Fluid: >1.8 L/day  Skin:  Stage 2 pressure ulcer to sacrum Stage 2 pressure ulcer to foot  Diet Order: NPO  EDUCATION NEEDS: -No education needs identified at this time  No intake or output data in the 24 hours ending 01/20/13 1536  Last BM: 11/3  Labs:   Recent Labs Lab 01/17/13 0530 01/18/13 0500 01/19/13 0505  NA 149* 147* 145  K 3.6 3.7 3.8  CL 120* 117* 115*  CO2 19 19 18*  BUN 60* 43* 42*  CREATININE 1.37* 1.16* 1.18*  CALCIUM 8.1* 8.2* 8.1*  GLUCOSE 165* 187* 188*    CBG (last 3)   Recent Labs  01/18/13 2127 01/19/13 1124 01/19/13 1608  GLUCAP 156* 174* 199*  Scheduled Meds: . atorvastatin  40 mg Oral QPM  . barrier cream  1 application Topical TID  . calcium citrate  1 tablet Oral Daily  . ciprofloxacin  500 mg Oral BID  . donepezil  10 mg Oral QPM  . famotidine  20 mg Oral Daily  . fluticasone  2 spray Each Nare Daily  . Folic Acid-Vit B6-Vit B12  1 tablet Oral Daily  . latanoprost  1 drop Both Eyes QHS  . metoprolol succinate  12.5 mg Oral Daily  . sodium chloride  3 mL Intravenous Q12H  . venlafaxine XR  75 mg Oral Daily  . [START ON 01/21/2013] Vitamin D (Ergocalciferol)  50,000 Units Oral Q30 days    Continuous Infusions:    Past Medical History   Diagnosis Date  . Left hemiplegia   . Aphasia   . Muscle weakness   . Diabetes mellitus   . Malaise   . Fatigue   . Breast neoplasm   . Hypertension   . Glaucoma   . UTI (lower urinary tract infection)   . Vitamin D deficiency   . Hiatal hernia   . Constipation   . Diarrhea   . Asthma   . Obesity   . Hyperlipidemia   . Alzheimer's dementia   . Depression   . Allergic rhinitis   . Dementia   . Stroke     infarct     Past Surgical History  Procedure Laterality Date  . Joint replacement      left knee in '90s  . Breast surgery      lumpectomy x2    Loyce Dys, MS RD LDN Clinical Inpatient Dietitian Pager: [phone] Weekend/After hours pager: [phone]

## 2013-01-21 LAB — CBC
Hemoglobin: 7 g/dL — ABNORMAL LOW (ref 12.0–15.0)
MCH: 27.3 pg (ref 26.0–34.0)
MCV: 85.9 fL (ref 78.0–100.0)
Platelets: 260 10*3/uL (ref 150–400)
RBC: 2.56 MIL/uL — ABNORMAL LOW (ref 3.87–5.11)
WBC: 10.8 10*3/uL — ABNORMAL HIGH (ref 4.0–10.5)

## 2013-01-21 LAB — BASIC METABOLIC PANEL
CO2: 18 mEq/L — ABNORMAL LOW (ref 19–32)
Glucose, Bld: 149 mg/dL — ABNORMAL HIGH (ref 70–99)
Potassium: 4.2 mEq/L (ref 3.5–5.1)
Sodium: 149 mEq/L — ABNORMAL HIGH (ref 135–145)

## 2013-01-21 LAB — GLUCOSE, CAPILLARY
Glucose-Capillary: 155 mg/dL — ABNORMAL HIGH (ref 70–99)
Glucose-Capillary: 154 mg/dL — ABNORMAL HIGH (ref 70–99)
Glucose-Capillary: 135 mg/dL — ABNORMAL HIGH (ref 70–99)

## 2013-01-21 MED ORDER — WHITE PETROLATUM GEL
Status: AC
  Filled 2013-01-21: qty 5

## 2013-01-21 MED ORDER — SODIUM CHLORIDE 0.45 % IV SOLN: INTRAVENOUS | Status: DC

## 2013-01-21 MED ORDER — SODIUM CHLORIDE 0.9 % IV SOLN
INTRAVENOUS | Status: DC
  Administered 2013-01-21: 17:00:00 via INTRAVENOUS

## 2013-01-21 NOTE — Progress Notes (Signed)
FMTS Attending Note Patient seen and examined by me, discussed with Dr Caleb Popp and I agree with his assessment and plan with following additions.  Patient is awake and appears more comfortable than yesterday by my exam; answers "yes" to most questions.   R leg is wrapped.   A/P: Patient with distal R femur fracture (comminuted displaced). Appreciate Orthopedics consult.  Given the appearance of the ecchymosis at admission yesterday, I would estimate this to be a very recent occurrence.  Would be interested in Orthopedics' thoughts on time course and mechanism to produce such a fracture.  Patient is being anticoagulated with lovenox for now.   Plan to offer nutrition at this time pending Ortho plans for possible surgical intervention. Patient had been eating with help of an aide 2 days ago when she was recently discharged.  To follow her Hgb; regarding hypernatremia, would change IVF to 1/2NS and follow her metabolic panel.  Paula Compton, MD

## 2013-01-21 NOTE — Progress Notes (Signed)
Family Medicine Teaching Service Daily Progress Note Intern Pager: [phone]  Patient name: [name] Medical record number: [id number] Date of birth: 1934-03-19 Age: 78 y.o. Gender: female  Primary Care Provider: Tana Conch, MD Consultants: Orthopedic surgery Code Status: Full Code  Pt Overview and Major Events to Date:  11/8 - considering surgical option  Assessment and Plan: [name] is a 78 y.o. female presenting with right femur fracture. PMH is significant for ischemic CVAs, vascular dementia, aphasia, history of DVTs, and recent admission for AMS, anemia, and UTI.   #Femur fracture: confirmed distal comminuted femur fracture by x-ray. Unknown cause. Notes on discharge exam from hospital yesterday note no edema on lower extremities. Calcium WNL.  Ortho consulted and will consider surgery option. They will talk to family regarding options per note. - Pain control: morphine 1mg  IV q 4hrs PRN. OxyIR 5mg  q3hrs PRN - follow-up Vit D  - follow-up orthopedic surgery recommendations  #Hypernatremia: possibly due to dehydration - normal saline IVF  #Urinary tract infection treated with ciprofloxacin, to be continued until 11/8, per most recent discharge summary.  - Continue cipro x2 days  #CHF:  - Hold lasix and lisinopril for low BP - Continue low-dose beta-blocker - continue lipitor   #History of CVA, vascular dementia, aphasia  - Aricept, venlafaxine  - Palliative care team involved in previous admission, and family interested in continuing care, so will consult them.   #History of sacral decubitus ulcer  - Continue heel protectors  - Barrier cream  - follow-up wound care  #Anemia - repeat CBC in AM  FEN/GI: NPO pending decision for possible surgery, MIVF: NS @100ml /hr PPx: lovenox  Disposition: pending orthopedic surgery recommendations  Subjective: Patient seems mildly distressed most likely from pain. When asked if she is in pain, she nodded yes. When asked  if it was her leg, she did not respond in a manner I could comprehend. Spoke to nurse about giving dose of morphine to help with pain. Spoke with her sister, [name] and updated her on what has been done regarding her sister. She voiced understanding and said she would be come to the hospital later in the day.  Objective: Temp:  [97.2 F (36.2 C)-98.8 F (37.1 C)] 98.3 F (36.8 C) (11/08 0543) Pulse Rate:  [87-90] 88 (11/08 0543) Resp:  [20-26] 20 (11/08 0543) BP: (102-109)/(53-67) 109/61 mmHg (11/08 0543) SpO2:  [100 %] 100 % (11/08 0543) Weight:  [213 lb (96.616 kg)] 213 lb (96.616 kg) (11/08 0700)  Physical Exam: General: laying in bed, in no acute distress Cardiovascular: RRR, grade II/VI systolic crescendo-decrescendo murmur heard best in upper right sternal border Respiratory: Clear to auscultation. Auscultated anteriorly only because of inability of patient to raise upor turn over Abdomen: Obese, soft, non-tender, bowel sounds present Extremities: warm, well perfused, right leg is wrapped, SCDs present on left leg Neuro: Alert, seems to respond appropriate to questions with nodding and shaking of her head but is non-verbal  Laboratory:  Recent Labs Lab 01/19/13 0505 01/20/13 1527 01/21/13 0600  WBC 14.9* 13.7* 10.8*  HGB 7.7* 7.9* 7.0*  HCT 23.2* 24.4* 22.0*  PLT 219 261 260    Recent Labs Lab 01/14/13 1446  01/19/13 0505 01/20/13 1527 01/21/13 0600  NA 146*  < > 145 146* 149*  K 4.7  < > 3.8 4.2 4.2  CL 111  < > 115* 116* 120*  CO2 18*  < > 18* 20 18*  BUN 92*  < > 42*  39* 39*  CREATININE 2.45*  < > 1.18* 1.14* 1.18*  CALCIUM 9.1  < > 8.1* 8.7 8.4  PROT 6.9  --   --  6.4  --   BILITOT 0.8  --   --  1.1  --   ALKPHOS 67  --   --  119*  --   ALT 81*  --   --  98*  --   AST 86*  --   --  61*  --   GLUCOSE 239*  < > 188* 170* 149*  < > = values in this interval not displayed.   Imaging/Diagnostic Tests:  Right Femur X-ray (01/20/2013) FINDINGS:   Significant osteophyte formation right hip joint. Right total knee  replacement. There is an oblique fracture involving the distal 3rd  of the femoral shaft. Fracture is mildly comminuted with apex medial  angulation. Distal fracture fragment is displaced anteriorly by  approximately 1/3 shaft with, and displaced laterally by about 1/2  shaft's with. There is calcified material in the suprapatellar  recess of the joint.  IMPRESSION:  Acute fracture distal femoral shaft. Irregularity of the subcapital  medial femur appears to be more likely due to a band of osteophyte  formation rather than acute fracture in this location.  Bilateral Hip X-ray (01/20/2013) FINDINGS:  The bones of the pelvis are intact. There is moderate bilateral hip  arthritis. On the frontal view, there is abnormal contour to the  medial right femoral neck and subcapital area. On lateral view  however, this area shows an intact cortex with no evidence of  fracture.  IMPRESSION:  No definite fractures. Abnormal appearing right subcapital femur on  the frontal view appears to be due to a band of osteophytes. The  lateral view does not confirm the presence of a fracture. If there  is reasonable clinical suspicion for right femur fracture, a CT  could be considered.  Right Knee X-ray (01/20/2013) FINDINGS:  The bones of the pelvis are intact. There is moderate bilateral hip  arthritis. On the frontal view, there is abnormal contour to the  medial right femoral neck and subcapital area. On lateral view  however, this area shows an intact cortex with no evidence of  fracture.  IMPRESSION:  No definite fractures. Abnormal appearing right subcapital femur on  the frontal view appears to be due to a band of osteophytes. The  lateral view does not confirm the presence of a fracture. If there  is reasonable clinical suspicion for right femur fracture, a CT  could be considered.    Jacquelin Hawking, MD 01/21/2013, 9:13  AM PGY-1, [facility] FPTS Intern pager: [phone], text pages welcome

## 2013-01-22 LAB — CBC
HCT: 21.3 % — ABNORMAL LOW (ref 36.0–46.0)
MCHC: 32.9 g/dL (ref 30.0–36.0)
MCV: 87.3 fL (ref 78.0–100.0)
Platelets: 299 10*3/uL (ref 150–400)
RBC: 2.44 MIL/uL — ABNORMAL LOW (ref 3.87–5.11)
RDW: 16.9 % — ABNORMAL HIGH (ref 11.5–15.5)
WBC: 9.1 10*3/uL (ref 4.0–10.5)

## 2013-01-22 LAB — BASIC METABOLIC PANEL
BUN: 38 mg/dL — ABNORMAL HIGH (ref 6–23)
CO2: 18 mEq/L — ABNORMAL LOW (ref 19–32)
Calcium: 8.8 mg/dL (ref 8.4–10.5)
Chloride: 118 mEq/L — ABNORMAL HIGH (ref 96–112)
Creatinine, Ser: 1.1 mg/dL (ref 0.50–1.10)
GFR calc Af Amer: 55 mL/min — ABNORMAL LOW (ref >90)
GFR calc non Af Amer: 48 mL/min — ABNORMAL LOW (ref >90)

## 2013-01-22 MED ORDER — OXYCODONE HCL 5 MG PO TABS
5.0000 mg | ORAL_TABLET | ORAL | Status: DC | PRN
  Administered 2013-01-22 (×3): 5 mg via ORAL
  Filled 2013-01-22 (×3): qty 1

## 2013-01-22 MED ORDER — SODIUM CHLORIDE 0.45 % IV SOLN
INTRAVENOUS | Status: DC
  Administered 2013-01-22 (×2): via INTRAVENOUS

## 2013-01-22 NOTE — Progress Notes (Signed)
FMTS Attending Note Patient seen and examined by me today; she is sitting up in bed and eating with the help of an aide.  She is able to verbalize her request for beverage, ("Sprite").  Appears more comfortable, answers "yes" when asked about pain.   R leg remains wrapped.   A/P: Patient with R distal femur comminuted fracture; seen by Orthopedics PA on the evening of 11/07, with note indicating plans to discuss with Orthopedic surgeon and patient's sister regarding advisability for surgical intervention.  We have resumed a diet order for her until further intervention is planned.  Pain control with morphine.  To keep patient's sister (medical POA) Colvin Caroli informed about changes and developments.  Paula Compton, MD

## 2013-01-22 NOTE — Progress Notes (Signed)
[name]  MRN: [id number] DOB/Age: Nov 20, 1934 78 y.o. Physician: [name], M.D.      Subjective: Aphasic. Resting comfortably in bed Vital Signs Temp:  [97.4 F (36.3 C)-97.8 F (36.6 C)] 97.6 F (36.4 C) (11/09 0502) Pulse Rate:  [84-88] 84 (11/09 0502) Resp:  [18-20] 18 (11/09 0502) BP: (112-124)/(54-68) 112/58 mmHg (11/09 0502) SpO2:  [100 %] 100 % (11/09 0502)  Lab Results  Recent Labs  01/21/13 0600 01/22/13 0615  WBC 10.8* 9.1  HGB 7.0* 7.0*  HCT 22.0* 21.3*  PLT 260 299   BMET  Recent Labs  01/21/13 0600 01/22/13 0515  NA 149* 148*  K 4.2 4.4  CL 120* 118*  CO2 18* 18*  GLUCOSE 149* 120*  BUN 39* 38*  CREATININE 1.18* 1.10  CALCIUM 8.4 8.8   INR  Date Value Range Status  01/20/2013 1.25  0.00 - 1.49 Final     Exam  RLE splinted, bilateral foot/heel protectors in place  Impression:  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  Plan Dr. Ranell [name] will be discussing with one of our joint revision specialists as to whether [name] is a surgical candidate, given here multiple profound medical co morbidities and the severity of her failed TKA now with periprosthetic fracture.  [name] M 01/22/2013, 12:18 PM

## 2013-01-22 NOTE — Consult Note (Addendum)
WOC wound consult note Reason for Consult:Stage III pressure ulcer at sacrum, recently healed partial thickness area at right ischial tuberosity.  Moisture associated skin damage (MASD) specifically, intertriginous dermatitis (ITD) beneath breasts and beneath pannus. Patient recently sustained a right femur fracture and is scheduled for repair in the am. Wound type:MASD (ITD) and Stage III pressure Pressure Ulcer POA: Yes Measurement:8cm x 7cm x 0.2cm Wound ZOX:WRUEA with sloughing epithelial tissue; possible sustained from injury or transfer immediately following. Drainage (amount, consistency, odor) scant serous Periwound:intact, dry Dressing procedure/placement/frequency:I will implement a soft silicone foam dressing to this area.  Despite fracture, patient is able to turn and reposition with assistance.  I will provide an air overlay as she is likely to be more immobile post op. An antimicrobial textile (InterDry Ag+) is provided to manage the intertriginous dermatitis. Bilateral Prevalon boots are in place to prevent heel ulceration by providing pressure redistribution in these areas. WOC nursing team will not follow, but will remain available to this patient, the nursing and medical team.  Please re-consult if needed. Thanks, Ladona Mow, MSN, RN, GNP, [place], [facility]ON-AP [phone])

## 2013-01-22 NOTE — Progress Notes (Signed)
Family Medicine Teaching Service Daily Progress Note Intern Pager: [phone]  Patient name: [name] Medical record number: [id number] Date of birth: 16-Jun-1934 Age: 78 y.o. Gender: female  Primary Care Provider: Tana Conch, MD Consultants: Orthopedic surgery, palliative care, WOC Code Status: Full Code  Pt Overview and Major Events to Date:  11/7: Admitted with R femur fracture 11/8: Considering surgical options, pain control  Assessment and Plan: [name] is a 78 y.o. female presenting with right femur fracture. PMH is significant for ischemic CVAs, vascular dementia, aphasia, bilateral TKAs, history of DVTs, and recent admission for AMS, anemia, and UTI.   # Femur fracture: Distal comminuted, displaced, peri-prosthetic femur fracture. Unknown cause.  - Orthopedic joint specialist to see to determine surgical options. Pt not ambulatory at baseline.  - Pain control: morphine 1-2mg  IV q 4hrs PRN - Vit D (ergocalciferol) 50,000u daily. Ca wnl. Vit D serum level pending. History of Vit D deficiency.  - No unstable VS's to suggest fat embolus.   # Anemia: Normocytic. History of chronic anemia. Required transfusion during previous hospitalization.  - Hgb: 7.9 > 7.0 > 7.0 > 7.0  # Hypernatremia and hyperchloremia: Stable, chronic - Na 144 (148), Cl 116 (118), bicarb 19 (18) all improved after D5 1/2NS  # Sacral decubitus ulcer, stage III 8cm x 7cm x 0.2cm, present on admission - Wound care recommendations appreciated: Soft silicone dressing with air overlay to sacral decubitus. Antimicrobial textile to intertriginous dermatitis.  - Continue prevalon heel protectors  # HTN: Echo 09/08/2012 showed EF 55-60%, inadequate study to evaluate diastolic dysfunction.  - Hold lisinopril for borderline low BP (101/53 this AM) - S4/crackles on exam concerning for volume overload. Will get CXR, restart low dose lasix - Continue low dose beta-blocker - Continue lipitor   # History of CVA,  vascular dementia, aphasia: Aricept, venlafaxine  - Palliative care team consulted: involved in previous admission, and family interested in continuing care  # CKD stage II: Cr 1.18 > 1.00, will continue with only oral fluids # Urinary tract infection: finished full course of treatment with ciprofloxacin # Leukocytosis: Resolved WBC 13.7 > 9.1 > 8.0. Tmax 98.3F # Glaucoma: Continue latanoprost qHS # Social: To keep patient's sister (healthcare POA) Colvin Caroli informed about changes and developments at [phone]  FEN/GI: Dysphagia diet eating well, KVO PPx: lovenox; SCD on L leg  Disposition: Pending orthopedic surgical candidacy and patient/family wishes.    Subjective: Patient sitting up in bed being fed breakfast. Per tech, ate all of breakfast. She does not respond to any questions regarding pain. Appears comfortable. No family at bedside this morning.   Objective: Temp:  [97.6 F (36.4 C)-98.4 F (36.9 C)] 98.4 F (36.9 C) (11/09 2108) Pulse Rate:  [78-95] 95 (11/09 2108) Resp:  [18-22] 20 (11/09 2108) BP: (112-131)/(57-65) 113/57 mmHg (11/09 2108) SpO2:  [100 %] 100 % (11/09 2108)  Physical Exam: General: 78 yo female laying in bed, in no acute distress Cardiovascular: RRR, grade II/VI holosystolic murmur RUSB, S4 noted Respiratory: Non-labored, crackles at bases at mid-axillary line bilaterally Abdomen: Obese, soft, non-tender, bowel sounds present Extremities: warm, well perfused, RLE is wrapped and splinted, SCD present on LLE. Heel protectors B/L Neuro: Alert, essentially nonverbal but somewhat interactive. No obvious focal deficits.   Laboratory:  Recent Labs Lab 01/20/13 1527 01/21/13 0600 01/22/13 0615  WBC 13.7* 10.8* 9.1  HGB 7.9* 7.0* 7.0*  HCT 24.4* 22.0* 21.3*  PLT 261 260 299    Recent Labs Lab  01/20/13 1527 01/21/13 0600 01/22/13 0515  NA 146* 149* 148*  K 4.2 4.2 4.4  CL 116* 120* 118*  CO2 20 18* 18*  BUN 39* 39* 38*  CREATININE 1.14*  1.18* 1.10  CALCIUM 8.7 8.4 8.8  PROT 6.4  --   --   BILITOT 1.1  --   --   ALKPHOS 119*  --   --   ALT 98*  --   --   AST 61*  --   --   GLUCOSE 170* 149* 120*   Imaging/Diagnostic Tests:  Right Femur X-ray (01/20/2013) FINDINGS:  Significant osteophyte formation right hip joint. Right total knee  replacement. There is an oblique fracture involving the distal 3rd  of the femoral shaft. Fracture is mildly comminuted with apex medial  angulation. Distal fracture fragment is displaced anteriorly by  approximately 1/3 shaft with, and displaced laterally by about 1/2  shaft's with. There is calcified material in the suprapatellar  recess of the joint.  IMPRESSION:  Acute fracture distal femoral shaft. Irregularity of the subcapital  medial femur appears to be more likely due to a band of osteophyte  formation rather than acute fracture in this location.  Bilateral Hip X-ray (01/20/2013) FINDINGS:  The bones of the pelvis are intact. There is moderate bilateral hip  arthritis. On the frontal view, there is abnormal contour to the  medial right femoral neck and subcapital area. On lateral view  however, this area shows an intact cortex with no evidence of  fracture.  IMPRESSION:  No definite fractures. Abnormal appearing right subcapital femur on  the frontal view appears to be due to a band of osteophytes. The  lateral view does not confirm the presence of a fracture. If there  is reasonable clinical suspicion for right femur fracture, a CT  could be considered.  Right Knee X-ray (01/20/2013) FINDINGS:  The bones of the pelvis are intact. There is moderate bilateral hip  arthritis. On the frontal view, there is abnormal contour to the  medial right femoral neck and subcapital area. On lateral view  however, this area shows an intact cortex with no evidence of  fracture.  IMPRESSION:  No definite fractures. Abnormal appearing right subcapital femur on  the frontal view appears  to be due to a band of osteophytes. The  lateral view does not confirm the presence of a fracture. If there  is reasonable clinical suspicion for right femur fracture, a CT  could be considered.  Hazeline Junker, MD 01/22/2013, 11:58 PM PGY-1, [facility] FPTS Intern pager: [phone], text pages welcome

## 2013-01-22 NOTE — Progress Notes (Signed)
Family Medicine Teaching Service Daily Progress Note Intern Pager: [phone]  Patient name: [name] Medical record number: [id number] Date of birth: [birth year] Age: 78 y.o. Gender: female  Primary Care Provider: Tana Conch, MD Consultants: Orthopedic surgery, palliative care, WOC Code Status: Full Code  Pt Overview and Major Events to Date:  11/7: Admitted with R femur fracture 11/8: Considering surgical options  Assessment and Plan: [name] is a 78 y.o. female presenting with right femur fracture. PMH is significant for ischemic CVAs, vascular dementia, aphasia, history of DVTs, and recent admission for AMS, anemia, and UTI.   #Femur fracture: Confirmed distal comminuted, displaced femur fracture by x-ray. Unknown cause. Notes on discharge exam from hospital yesterday note no edema on lower extremities.  - Ortho consulted, per note, plan is to speak with joint specialist and family about operative management. - Pain control: morphine 1mg  IV q 4hrs PRN - Vit D (ergocalciferol) 50k units given. Ca wnl - Checking Vit D level with AM labs.   #Anemia: Stable. Normocytic. Required transfusion during previous hospitalization.  - 7.9 > 7.0 > 7.0  #Hypernatremia and hyperchloremia: Stable, chronic - Switched to D5 1/2 NS overnight  - Na 148 (149), Cl 118 (120), bicarb 18 (18)  #CHF: Echo 09/05/2012 showed EF 55-60% and no mention of diastolic dysfunction. Study quality was poor.  - Hold lasix and lisinopril for low BP - Continue low-dose beta-blocker - continue lipitor   #History of CVA, vascular dementia, aphasia  - Aricept, venlafaxine  - Palliative care team involved in previous admission, and family interested in continuing care, so will consult them.   #Sacral decubitus ulcer  - Continue heel protectors  - Barrier cream  - follow-up wound care  #Urinary tract infection finished full course of treatment with ciprofloxacin  #Social: To keep patient's sister  (healthcare POA) Colvin Caroli informed about changes and developments.   FEN/GI: Given diet, D5 1/2NS @100ml /hr PPx: lovenox; SCD on L leg  Disposition: pending orthopedic surgery recommendations  Subjective: Patient sitting up in bed with breakfast tray demonstrating that she ate a moderate amount of breakfast. She does not respond to any questions regarding pain. Appears more comfortable. No family at bedside.   Objective: Temp:  [97.4 F (36.3 C)-97.8 F (36.6 C)] 97.6 F (36.4 C) (11/09 0502) Pulse Rate:  [84-88] 84 (11/09 0502) Resp:  [18-20] 18 (11/09 0502) BP: (112-124)/(54-68) 112/58 mmHg (11/09 0502) SpO2:  [100 %] 100 % (11/09 0502)  Physical Exam: General: 78 yo female laying in bed, in no acute distress Cardiovascular: RRR, grade II/VI holosystolic murmur RUSB Respiratory: Clear to auscultation. Auscultated anteriorly only because of inability of patient to sit up or roll over Abdomen: Obese, soft, non-tender, bowel sounds present Extremities: warm, well perfused, right leg is wrapped, SCDs present on left leg Neuro: Alert, essentially nonverbal. No obvious focal deficits.   Laboratory:  Recent Labs Lab 01/19/13 0505 01/20/13 1527 01/21/13 0600  WBC 14.9* 13.7* 10.8*  HGB 7.7* 7.9* 7.0*  HCT 23.2* 24.4* 22.0*  PLT 219 261 260    Recent Labs Lab 01/19/13 0505 01/20/13 1527 01/21/13 0600  NA 145 146* 149*  K 3.8 4.2 4.2  CL 115* 116* 120*  CO2 18* 20 18*  BUN 42* 39* 39*  CREATININE 1.18* 1.14* 1.18*  CALCIUM 8.1* 8.7 8.4  PROT  --  6.4  --   BILITOT  --  1.1  --   ALKPHOS  --  119*  --   ALT  --  98*  --   AST  --  61*  --   GLUCOSE 188* 170* 149*   Imaging/Diagnostic Tests:  Right Femur X-ray (01/20/2013) FINDINGS:  Significant osteophyte formation right hip joint. Right total knee  replacement. There is an oblique fracture involving the distal 3rd  of the femoral shaft. Fracture is mildly comminuted with apex medial  angulation. Distal  fracture fragment is displaced anteriorly by  approximately 1/3 shaft with, and displaced laterally by about 1/2  shaft's with. There is calcified material in the suprapatellar  recess of the joint.  IMPRESSION:  Acute fracture distal femoral shaft. Irregularity of the subcapital  medial femur appears to be more likely due to a band of osteophyte  formation rather than acute fracture in this location.  Bilateral Hip X-ray (01/20/2013) FINDINGS:  The bones of the pelvis are intact. There is moderate bilateral hip  arthritis. On the frontal view, there is abnormal contour to the  medial right femoral neck and subcapital area. On lateral view  however, this area shows an intact cortex with no evidence of  fracture.  IMPRESSION:  No definite fractures. Abnormal appearing right subcapital femur on  the frontal view appears to be due to a band of osteophytes. The  lateral view does not confirm the presence of a fracture. If there  is reasonable clinical suspicion for right femur fracture, a CT  could be considered.  Right Knee X-ray (01/20/2013) FINDINGS:  The bones of the pelvis are intact. There is moderate bilateral hip  arthritis. On the frontal view, there is abnormal contour to the  medial right femoral neck and subcapital area. On lateral view  however, this area shows an intact cortex with no evidence of  fracture.  IMPRESSION:  No definite fractures. Abnormal appearing right subcapital femur on  the frontal view appears to be due to a band of osteophytes. The  lateral view does not confirm the presence of a fracture. If there  is reasonable clinical suspicion for right femur fracture, a CT  could be considered.  Hazeline Junker, MD 01/22/2013, 7:06 AM PGY-1, [facility] Family Medicine FPTS Intern pager: [phone], text pages welcome

## 2013-01-22 NOTE — Progress Notes (Signed)
FMTS Attending Note Patient seen and examined by me today, discussed case with Dr Durene Cal and I agree with his assessment and plan.  Please my separate note for further details. Paula Compton, MD

## 2013-01-23 ENCOUNTER — Inpatient Hospital Stay (HOSPITAL_COMMUNITY): Payer: Medicare Other

## 2013-01-23 DIAGNOSIS — I359 Nonrheumatic aortic valve disorder, unspecified: Secondary | ICD-10-CM

## 2013-01-23 DIAGNOSIS — F0150 Vascular dementia without behavioral disturbance: Secondary | ICD-10-CM

## 2013-01-23 LAB — CBC
MCV: 88 fL (ref 78.0–100.0)
Platelets: 312 10*3/uL (ref 150–400)
RBC: 2.49 MIL/uL — ABNORMAL LOW (ref 3.87–5.11)
RDW: 16.6 % — ABNORMAL HIGH (ref 11.5–15.5)
WBC: 8 10*3/uL (ref 4.0–10.5)

## 2013-01-23 LAB — BASIC METABOLIC PANEL
CO2: 19 mEq/L (ref 19–32)
Calcium: 7.9 mg/dL — ABNORMAL LOW (ref 8.4–10.5)
Creatinine, Ser: 1 mg/dL (ref 0.50–1.10)
GFR calc Af Amer: 62 mL/min — ABNORMAL LOW (ref >90)
GFR calc non Af Amer: 53 mL/min — ABNORMAL LOW (ref >90)
Sodium: 143 mEq/L (ref 135–145)

## 2013-01-23 MED ORDER — MORPHINE SULFATE 2 MG/ML IJ SOLN
1.0000 mg | INTRAMUSCULAR | Status: DC | PRN
  Filled 2013-01-23: qty 1

## 2013-01-23 MED ORDER — FUROSEMIDE 20 MG PO TABS
20.0000 mg | ORAL_TABLET | Freq: Every day | ORAL | Status: DC
  Administered 2013-01-23 – 2013-01-25 (×3): 20 mg via ORAL
  Filled 2013-01-23 (×3): qty 1

## 2013-01-23 MED ORDER — ENSURE PUDDING PO PUDG
1.0000 | Freq: Three times a day (TID) | ORAL | Status: DC
  Administered 2013-01-23 – 2013-01-25 (×6): 1 via ORAL

## 2013-01-23 NOTE — Progress Notes (Signed)
NUTRITION FOLLOW UP  Intervention:   1.  Supplements; Ensure Pudding po TID, each supplement provides 170 kcal and 4 grams of protein.  2.  Modify diet; consider removing Renal restrictions as pt's diet is already limited in variety by texture/consistency needs.  Pt's electrolytes currently WNL.   Nutrition Dx:   Increased nutrient needs, ongoing  Monitor:   1. Food/Beverage; pt meeting >/=90% estimated needs with tolerance. Somewhat met with variable but improving intake 2. Wt/wt change; monitor trends.  Ongoing.   Assessment:   Pt admitted with periprosthetic fracture at distal femur.  Best care plan currently being discussed.   Pt NPO on admission, her diet has been advanced to Dysphagia 1 with thin liquids and Renal restrictions.  She ate 100% of her breakfast this AM.  PO intake overall remains variable.   RD to order supplements.   Height: Ht Readings from Last 1 Encounters:  01/21/13 5\' 6"  (1.676 m)    Weight Status:   Wt Readings from Last 1 Encounters:  01/21/13 213 lb (96.616 kg)   Re-estimated needs:  Kcal: 1680-1800 Protein: 77-90g Fluid: >1.8 L/day  Skin:  Stage 2 pressure ulcer to sacrum  Stage 2 pressure ulcer to foot  Diet Order: Dysphagia 1, thin with Renal restrictions   Intake/Output Summary (Last 24 hours) at 01/23/13 1230 Last data filed at 01/22/13 1836  Gross per 24 hour  Intake   2395 ml  Output      0 ml  Net   2395 ml    Last BM: 11/8   Labs:   Recent Labs Lab 01/21/13 0600 01/22/13 0515 01/23/13 0615  NA 149* 148* 143  K 4.2 4.4 3.9  CL 120* 118* 116*  CO2 18* 18* 19  BUN 39* 38* 34*  CREATININE 1.18* 1.10 1.00  CALCIUM 8.4 8.8 7.9*  GLUCOSE 149* 120* 153*    CBG (last 3)   Recent Labs  01/21/13 0635 01/21/13 0747 01/21/13 1220  GLUCAP 155* 154* 135*    Scheduled Meds: . atorvastatin  40 mg Oral QPM  . barrier cream  1 application Topical TID  . calcium citrate  1 tablet Oral Daily  . donepezil  10 mg Oral  QPM  . enoxaparin (LOVENOX) injection  40 mg Subcutaneous Q24H  . famotidine  20 mg Oral Daily  . fluticasone  2 spray Each Nare Daily  . Folic Acid-Vit B6-Vit B12  1 tablet Oral Daily  . furosemide  20 mg Oral Daily  . latanoprost  1 drop Both Eyes QHS  . metoprolol succinate  12.5 mg Oral Daily  . venlafaxine XR  75 mg Oral Daily  . Vitamin D (Ergocalciferol)  50,000 Units Oral Q30 days    Continuous Infusions:   Loyce Dys, MS RD LDN Clinical Inpatient Dietitian Pager: [phone] Weekend/After hours pager: [phone]

## 2013-01-23 NOTE — Progress Notes (Signed)
Attending Addendum  I examined the patient and discussed the assessment and plan with Dr. Jarvis Newcomer. I have reviewed the note and agree.  Appreciate ortho care and recommendations.     Dessa Phi, MD FAMILY MEDICINE TEACHING SERVICE

## 2013-01-23 NOTE — Progress Notes (Signed)
Orthopedics Progress Note  Subjective: Patient is non verbal.  Objective:  Filed Vitals:   01/23/13 1102  BP: 131/55  Pulse: 88  Temp:   Resp:     General: Awake and alert  Musculoskeletal: Right knee appears well aligned, bulky Jones-Type Splint intact, distally foot is well perfused   Lab Results  Component Value Date   WBC 8.0 01/23/2013   HGB 7.0* 01/23/2013   HCT 21.9* 01/23/2013   MCV 88.0 01/23/2013   PLT 312 01/23/2013       Component Value Date/Time   NA 143 01/23/2013 0615   NA 147 05/24/2012   K 3.9 01/23/2013 0615   K 4.0 01/25/2012 1003   CL 116* 01/23/2013 0615   CL 111 01/25/2012 1003   CO2 19 01/23/2013 0615   CO2 25 01/25/2012 1003   GLUCOSE 153* 01/23/2013 0615   BUN 34* 01/23/2013 0615   BUN 26* 05/24/2012   BUN 30 01/13/2012 1627   CREATININE 1.00 01/23/2013 0615   CREATININE 0.7 05/24/2012   CREATININE 0.88 01/25/2012 1003   CALCIUM 7.9* 01/23/2013 0615   CALCIUM 8.4 01/25/2012 1003   GFRNONAA 53* 01/23/2013 0615   GFRAA 62* 01/23/2013 0615    Lab Results  Component Value Date   INR 1.25 01/20/2013   INR 1.61* 01/14/2013    Assessment/Plan: s/p peri-prosthetic distal femur fracture in nonambulator with advanced dementia I discussed Ms Ozment's condition with Ms Colvin Caroli, the patient's sister by telephone explaining all options for management.  I have reviewed the case with Dr Durene Romans.  This would be a very difficult reconstructive surgery and the patient would be at high perioperative risk for both morbidity and mortality.  Dr Charlann Boxer recommended surgery only be considered for refractory severe pain.  If able to keep reasonably comfortable with external bracing and pain management with medications, then non surgical care is what we would recommend.  The patient's sister is in complete agreement and would prefer no surgery.  Would need to keep the patient basically at bedrest with minimal transfers and NWB on the right until this  heals..roughly 2 1/2 months.  Patient at very high risk for VTE, on Xarelto. Will follow.  Patient anemic as well.  Defer transfusion to internal medicine team.  Would need to be transfused, medically optimized, and OFF Xarelto to perform her surgery if the daughter changed her mind or care team was unable to manage the patient's pain.  Thanks!  Almedia Balls. Ranell Patrick, MD 01/23/2013 1:32 PM

## 2013-01-24 DIAGNOSIS — L8990 Pressure ulcer of unspecified site, unspecified stage: Secondary | ICD-10-CM

## 2013-01-24 DIAGNOSIS — L89109 Pressure ulcer of unspecified part of back, unspecified stage: Secondary | ICD-10-CM

## 2013-01-24 DIAGNOSIS — S7291XA Unspecified fracture of right femur, initial encounter for closed fracture: Secondary | ICD-10-CM

## 2013-01-24 DIAGNOSIS — I6350 Cerebral infarction due to unspecified occlusion or stenosis of unspecified cerebral artery: Secondary | ICD-10-CM

## 2013-01-24 DIAGNOSIS — E669 Obesity, unspecified: Secondary | ICD-10-CM

## 2013-01-24 LAB — BASIC METABOLIC PANEL
BUN: 29 mg/dL — ABNORMAL HIGH (ref 6–23)
CO2: 20 mEq/L (ref 19–32)
Calcium: 8.5 mg/dL (ref 8.4–10.5)
Chloride: 114 mEq/L — ABNORMAL HIGH (ref 96–112)
Creatinine, Ser: 0.92 mg/dL (ref 0.50–1.10)
Glucose, Bld: 142 mg/dL — ABNORMAL HIGH (ref 70–99)

## 2013-01-24 LAB — CBC
HCT: 25.4 % — ABNORMAL LOW (ref 36.0–46.0)
Hemoglobin: 7.9 g/dL — ABNORMAL LOW (ref 12.0–15.0)
MCH: 27.2 pg (ref 26.0–34.0)
MCV: 87.6 fL (ref 78.0–100.0)
RBC: 2.9 MIL/uL — ABNORMAL LOW (ref 3.87–5.11)
WBC: 10 10*3/uL (ref 4.0–10.5)

## 2013-01-24 NOTE — Progress Notes (Signed)
I have reviewed and discussed the care of this patient in detail with the resident physician including pertinent patient records, physical exam findings and data. I agree with details of this encounter.   

## 2013-01-24 NOTE — Progress Notes (Signed)
Attending Addendum  I examined the patient and discussed the assessment and plan with Dr. Jarvis Newcomer. I have reviewed the note and agree.  The patient is stable and appears that her pain is well-controlled. Her hemoglobin is stable at 7.9 today. I discussed patient's care and plan update her sister Ms. Coleman. Her sister is concerned and is interested in Ms. Failla is receiving palliative care services upon discharge. She would also like her to have the alternating pressure mattress and strict wound care instructions. She is amenable to her sister returning back to [facility]. She looks forward to hearing from the social worker who was consulted today regarding placement.     Dessa Phi, MD FAMILY MEDICINE TEACHING SERVICE

## 2013-01-24 NOTE — Progress Notes (Signed)
Family Medicine Teaching Service Daily Progress Note Intern Pager: [phone]  Patient name: [name] Medical record number: [id number] Date of birth: 1935-01-31 Age: 78 y.o. Gender: female  Primary Care Provider: Tana Conch, MD Consultants: Orthopedic surgery, palliative care, WOC Code Status: Full Code  Pt Overview and Major Events to Date:  11/7: Admitted with R femur fracture 11/8: Considering surgical options, pain control 11/10 Orthopedics, with agreement from healthcare power of attorney, suggests non-operative management.  Assessment and Plan: [name] is a 78 y.o. female presenting with right femur fracture. PMH is significant for ischemic CVAs, vascular dementia, aphasia, bilateral TKAs, history of DVTs, and recent admission for AMS, anemia, and UTI.   # Femur fracture: Distal comminuted, displaced, peri-prosthetic femur fracture. Unknown cause.  - Orthopedic joint specialist recommends no surgery, NWB for 10 weeks for fracture healing with external brace. Sister, PoA, agrees with this plan for pain control and medical management.  - Pt not ambulatory with dementia at baseline.  - Pain seems controlled on morphine 1-2mg  IV q 4hrs PRN - Vit D (ergocalciferol) 50,000u daily. Ca wnl. Vit D serum level pending. History of Vit D deficiency.  - No unstable VS's to suggest fat embolus. On Lovenox  # Anemia: Normocytic. History of chronic anemia. Required transfusion during previous hospitalization.  - Hgb: 7.9 > 7.0 > 7.0 > 7.0 > 7.9; will not recheck  # Hypernatremia and hyperchloremia: Improved. Will not recheck in AM.  # Sacral decubitus ulcer, stage III 8cm x 7cm x 0.2cm, present on admission - Wound care recommendations appreciated: Soft silicone dressing with air overlay to sacral decubitus. Antimicrobial textile to intertriginous dermatitis.  - Continue prevalon heel protectors  # HTN: Echo 09/08/2012 showed EF 55-60%, inadequate study to evaluate diastolic  dysfunction.  - Hold lisinopril for borderline low BP (101/53 this AM) - S4/crackles on exam concerning for volume overload. CXR with effusion, atelectasis; restart low dose lasix - Continue low dose beta-blocker - Continue lipitor   # History of CVA, vascular dementia, aphasia: Aricept, venlafaxine  - Palliative care team consulted: involved in previous admission, and family interested in continuing care  # CKD stage II: Cr 1.18 > 1.00, will continue with only oral fluids # Urinary tract infection: finished full course of treatment with ciprofloxacin # Leukocytosis: Resolved WBC 13.7 > 9.1 > 8.0. Tmax 98.59F # Glaucoma: Continue latanoprost qHS # Social: To keep patient's sister (healthcare POA) Colvin Caroli informed about changes and developments at [phone]  FEN/GI: Dysphagia diet eating well, KVO PPx: lovenox; SCD on L leg  Disposition: Pending placement back at [facility]. CSW consulted today.  Subjective: Patient sitting up in bed being fed breakfast. Per tech, ate all of breakfast. She does not respond to any questions regarding pain. Appears comfortable. No family at bedside this morning.   Objective: Temp:  [98.3 F (36.8 C)-98.5 F (36.9 C)] 98.3 F (36.8 C) (11/11 0514) Pulse Rate:  [88-96] 96 (11/11 0514) Resp:  [20-22] 20 (11/11 0514) BP: (104-131)/(54-65) 121/65 mmHg (11/11 0514) SpO2:  [97 %-100 %] 100 % (11/11 0514)  Physical Exam: General: 78 yo female laying in bed, in no acute distress Cardiovascular: RRR, grade II/VI holosystolic murmur RUSB, S4 noted Respiratory: Non-labored, crackles at bases at mid-axillary line bilaterally Abdomen: Obese, soft, non-tender, bowel sounds present Extremities: warm, well perfused, RLE is wrapped and splinted, SCD present on LLE. Heel protectors B/L Neuro: Alert, essentially nonverbal but somewhat interactive. No obvious focal deficits.   Laboratory:  Recent  Labs Lab 01/21/13 0600 01/22/13 0615 01/23/13 0615   WBC 10.8* 9.1 8.0  HGB 7.0* 7.0* 7.0*  HCT 22.0* 21.3* 21.9*  PLT 260 299 312    Recent Labs Lab 01/20/13 1527 01/21/13 0600 01/22/13 0515 01/23/13 0615  NA 146* 149* 148* 143  K 4.2 4.2 4.4 3.9  CL 116* 120* 118* 116*  CO2 20 18* 18* 19  BUN 39* 39* 38* 34*  CREATININE 1.14* 1.18* 1.10 1.00  CALCIUM 8.7 8.4 8.8 7.9*  PROT 6.4  --   --   --   BILITOT 1.1  --   --   --   ALKPHOS 119*  --   --   --   ALT 98*  --   --   --   AST 61*  --   --   --   GLUCOSE 170* 149* 120* 153*   Imaging/Diagnostic Tests:  Right Femur X-ray (01/20/2013) FINDINGS:  Significant osteophyte formation right hip joint. Right total knee  replacement. There is an oblique fracture involving the distal 3rd  of the femoral shaft. Fracture is mildly comminuted with apex medial  angulation. Distal fracture fragment is displaced anteriorly by  approximately 1/3 shaft with, and displaced laterally by about 1/2  shaft's with. There is calcified material in the suprapatellar  recess of the joint.  IMPRESSION:  Acute fracture distal femoral shaft. Irregularity of the subcapital  medial femur appears to be more likely due to a band of osteophyte  formation rather than acute fracture in this location.  Bilateral Hip X-ray (01/20/2013) FINDINGS:  The bones of the pelvis are intact. There is moderate bilateral hip  arthritis. On the frontal view, there is abnormal contour to the  medial right femoral neck and subcapital area. On lateral view  however, this area shows an intact cortex with no evidence of  fracture.  IMPRESSION:  No definite fractures. Abnormal appearing right subcapital femur on  the frontal view appears to be due to a band of osteophytes. The  lateral view does not confirm the presence of a fracture. If there  is reasonable clinical suspicion for right femur fracture, a CT  could be considered.  Right Knee X-ray (01/20/2013) FINDINGS:  The bones of the pelvis are intact. There is  moderate bilateral hip  arthritis. On the frontal view, there is abnormal contour to the  medial right femoral neck and subcapital area. On lateral view  however, this area shows an intact cortex with no evidence of  fracture.  IMPRESSION:  No definite fractures. Abnormal appearing right subcapital femur on  the frontal view appears to be due to a band of osteophytes. The  lateral view does not confirm the presence of a fracture. If there  is reasonable clinical suspicion for right femur fracture, a CT  could be considered.  Hazeline Junker, MD 01/24/2013, 9:38 AM PGY-1, [facility] FPTS Intern pager: [phone], text pages welcome

## 2013-01-25 ENCOUNTER — Other Ambulatory Visit: Payer: Self-pay | Admitting: Family Medicine

## 2013-01-25 DIAGNOSIS — E559 Vitamin D deficiency, unspecified: Secondary | ICD-10-CM

## 2013-01-25 LAB — CBC
HCT: 23.8 % — ABNORMAL LOW (ref 36.0–46.0)
MCH: 27.6 pg (ref 26.0–34.0)
MCV: 87.5 fL (ref 78.0–100.0)
Platelets: 322 10*3/uL (ref 150–400)
RBC: 2.72 MIL/uL — ABNORMAL LOW (ref 3.87–5.11)
RDW: 16.9 % — ABNORMAL HIGH (ref 11.5–15.5)

## 2013-01-25 MED ORDER — TRAMADOL HCL 50 MG PO TABS: 50.0000 mg | ORAL_TABLET | Freq: Four times a day (QID) | ORAL | Status: DC | PRN

## 2013-01-25 MED ORDER — ACETAMINOPHEN ER 650 MG PO TBCR: 650.0000 mg | EXTENDED_RELEASE_TABLET | Freq: Three times a day (TID) | ORAL | Status: DC

## 2013-01-25 MED ORDER — PRO-STAT 64 PO LIQD: 30.0000 mL | Freq: Three times a day (TID) | ORAL | Status: DC

## 2013-01-25 MED ORDER — ASPIRIN 81 MG PO TABS: 81.0000 mg | ORAL_TABLET | Freq: Every day | ORAL | Status: DC

## 2013-01-25 MED ORDER — FA-PYRIDOXINE-CYANOCOBALAMIN 2.5-25-2 MG PO TABS
1.0000 | ORAL_TABLET | Freq: Every day | ORAL | Status: DC
  Administered 2013-01-25: 1 via ORAL
  Filled 2013-01-25: qty 1

## 2013-01-25 MED ORDER — MORPHINE SULFATE (CONCENTRATE) 10 MG /0.5 ML PO SOLN: 10.0000 mg | Freq: Three times a day (TID) | ORAL | Status: DC

## 2013-01-25 MED ORDER — ENOXAPARIN SODIUM 40 MG/0.4ML ~~LOC~~ SOLN: 40.0000 mg | SUBCUTANEOUS | Status: DC

## 2013-01-25 MED ORDER — MORPHINE SULFATE (CONCENTRATE) 10 MG /0.5 ML PO SOLN: 10.0000 mg | ORAL | Status: DC | PRN

## 2013-01-25 MED ORDER — RIVAROXABAN 20 MG PO TABS: 20.0000 mg | ORAL_TABLET | Freq: Every day | ORAL | Status: DC

## 2013-01-25 MED ORDER — MORPHINE SULFATE (CONCENTRATE) 10 MG /0.5 ML PO SOLN
10.0000 mg | Freq: Three times a day (TID) | ORAL | Status: DC
  Administered 2013-01-25: 10 mg via ORAL
  Filled 2013-01-25: qty 0.5

## 2013-01-25 NOTE — Clinical Social Work Note (Signed)
Clinical Social Worker facilitated patient discharge including contacting patient family and facility to confirm patient discharge plans.  Clinical information faxed to facility and family agreeable with plan.  CSW arranged ambulance transport via [facility] to [place].  RN to call report prior to discharge.  Clinical Social Worker will sign off for now as social work intervention is no longer needed. Please consult [place] again if new need arises.  Macario Golds, [place] [phone]

## 2013-01-25 NOTE — Discharge Summary (Signed)
Attending Addendum  I examined the patient and discussed the discharge plan with Dr. Jarvis Newcomer. I have reviewed the note and agree.     Patient will require 10 weeks of minimal transfers and non-weight bearing on her right.   Patient started back on xarelto since no surgery is planned.  Soft cast to remain intact and change at ortho f/u prn (soft cast instead of hard due to high risk of skin breakdown).   Dessa Phi, MD FAMILY MEDICINE TEACHING SERVICE

## 2013-01-25 NOTE — Discharge Summary (Signed)
Family Medicine Teaching [facility] Discharge Summary  Patient name: [name] Medical record number: [id number] Date of birth: 1934-12-12 Age: 78 y.o. Gender: female Date of Admission: 01/20/2013  Date of Discharge: 01/26/2013 Admitting Physician: Barbaraann Barthel, MD  Primary Care Provider: Tana Conch, MD Consultants: Orthopedics, Palliative Care, Social Work  Indication for Hospitalization: Closed, peri-prosthetic R femur fracture  Discharge Diagnoses/Problem List:  Patient Active Problem List   Diagnosis Date Noted  . Closed fracture of right femur 01/24/2013  . Stroke 01/14/2013  . UTI (lower urinary tract infection) 01/14/2013  . Acute on chronic kidney failure 01/14/2013  . Anemia 05/25/2012  . DVT (deep venous thrombosis) 05/12/2012  . Mitral stenosis 02/15/2012  . Sacral pressure sore 02/15/2012  . At risk for diabetes mellitus 12/27/2011  . Hypertension 12/27/2011  . Aortic stenosis 12/11/2011  . CKD (chronic kidney disease) stage 2, GFR 60-89 ml/min 12/11/2011  . Breast neoplasm   . Glaucoma   . Vitamin D deficiency   . Hiatal hernia   . Obesity   . Hyperlipidemia   . Vascular dementia   . Depression   . Allergic rhinitis    Disposition: Discharged to return to [facility]  Discharge Condition: Stable  Discharge Exam:  General: 78 yo female laying in bed, in no acute distress  Cardiovascular: RRR, grade II/VI holosystolic murmur RUSB Respiratory: Non-labored, CTAB Abdomen: Obese, soft, non-tender, bowel sounds present  Extremities: warm, well perfused, RLE is wrapped and splinted, SCD present on LLE. Heel protectors B/L. NO major musculoskeletal defects elsewhere  Neuro: Alert, nonverbal, but interactive Skin: Sacral decubitus ulcer, 8cm x 7cm x 0.2cm on admission, covered by silicone dressing.   Brief Hospital Course:  [name] is a 78 y.o. female with an extensive past medical history including history of DVT and ischemic CVAs, vascular  dementia, and bilateral TKAs presenting on 11/7 with right femur fracture.  [name] had been discharged [facility] after improvement in altered mental status, treatment for UTI, and transfusion for anemia. At [facility] the following day, she was found to have reddish-brown ecchymosis on the dorsolateral leg above the knee. An x-ray showed a fracture and she was admitted for management. A repeat x-ray showed a distal comminuted, displaced, peri-prosthetic right femur fracture. The mechanism of injury is still not known, but presumed to have occurred after discharge, as the discharge exam noted no ecchymosis.    An orthopedic joint specialist evaluated her and, in light of her dementia and bed-bound state, recommended no surgery. Colvin Caroli, the patient's sister and healthcare power of attorney, was involved in this decision and agrees that the risks outweigh the benefits of operative fixation. She is to be non weight-bearing for 10 weeks and to wear a soft compressive dressing. She was on xarelto prior to admission, and placed on lovenox and is to continue this on discharge.   Pain control was difficult to assess, as [name] is unable to communicate beyond intermittently responding to yes/no questions. She has an allergy of unknown severity to vicodin, but tolerated morphine 2mg  q4h prn and tylenol 650mg  q6h prn. This was transitioned to roxanol 1mg  po q8h, as pt cannot reliably request pain medications.    [name] continued to have a normocytic anemia, but did not require transfusion. Hemoglobin nadir was 7.0 and remained stable, rising to 7.9 prior to discharge.   She has a stage III sacral decubitus ulcer (8cm x 7cm x 0.2cm) present on  admission, for which a wound care nurse recommended a soft silicone dressing with air overlay to sacral decubitus. Antimicrobial textile to intertriginous dermatitis and heel protectors were also recommended.   Lipitor, low dose  lasix and beta blocker were continued. Lisinopril was held for borderline hypotension on arrival. Blood pressure has not been elevated throughout this admission.   Aricept and venlafaxine were continued for vascular dementia. Palliative care team was consulted, and family is  interested in continuing care at [facility].   A full course of treatment with ciprofloxacin was continued for a urinary tract infection  Issues for Follow Up:  - Pain control - Continued palliative care involvement - Keep patient's sister (healthcare POA) Colvin Caroli informed about changes and developments at [phone]   Significant Procedures: None  Significant Labs and Imaging:   Recent Labs Lab 01/23/13 0615 01/24/13 1042 01/25/13 0602  WBC 8.0 10.0 9.0  HGB 7.0* 7.9* 7.5*  HCT 21.9* 25.4* 23.8*  PLT 312 352 322    Recent Labs Lab 01/20/13 1527 01/21/13 0600 01/22/13 0515 01/23/13 0615 01/24/13 1042  NA 146* 149* 148* 143 142  K 4.2 4.2 4.4 3.9 4.4  CL 116* 120* 118* 116* 114*  CO2 20 18* 18* 19 20  GLUCOSE 170* 149* 120* 153* 142*  BUN 39* 39* 38* 34* 29*  CREATININE 1.14* 1.18* 1.10 1.00 0.92  CALCIUM 8.7 8.4 8.8 7.9* 8.5  ALKPHOS 119*  --   --   --   --   AST 61*  --   --   --   --   ALT 98*  --   --   --   --   ALBUMIN 2.0*  --   --   --   --    Right Femur X-ray (01/20/2013)  FINDINGS:  Significant osteophyte formation right hip joint. Right total knee  replacement. There is an oblique fracture involving the distal 3rd  of the femoral shaft. Fracture is mildly comminuted with apex medial  angulation. Distal fracture fragment is displaced anteriorly by  approximately 1/3 shaft with, and displaced laterally by about 1/2  shaft's with. There is calcified material in the suprapatellar  recess of the joint.  IMPRESSION:  Acute fracture distal femoral shaft. Irregularity of the subcapital  medial femur appears to be more likely due to a band of osteophyte  formation rather than acute  fracture in this location.  Bilateral Hip X-ray (01/20/2013)  FINDINGS:  The bones of the pelvis are intact. There is moderate bilateral hip  arthritis. On the frontal view, there is abnormal contour to the  medial right femoral neck and subcapital area. On lateral view  however, this area shows an intact cortex with no evidence of  fracture.  IMPRESSION:  No definite fractures. Abnormal appearing right subcapital femur on  the frontal view appears to be due to a band of osteophytes. The  lateral view does not confirm the presence of a fracture. If there  is reasonable clinical suspicion for right femur fracture, a CT  could be considered.  Right Knee X-ray (01/20/2013)  FINDINGS:  The bones of the pelvis are intact. There is moderate bilateral hip  arthritis. On the frontal view, there is abnormal contour to the  medial right femoral neck and subcapital area. On lateral view  however, this area shows an intact cortex with no evidence of  fracture.  IMPRESSION:  No definite fractures. Abnormal appearing right subcapital femur on  the frontal view appears to be  due to a band of osteophytes. The  lateral view does not confirm the presence of a fracture. If there  is reasonable clinical suspicion for right femur fracture, a CT  could be considered.  Results/Tests Pending at Time of Discharge: None  Discharge Medications:    Medication List    STOP taking these medications       ciprofloxacin 500 MG tablet  Commonly known as:  CIPRO     dipyridamole-aspirin 200-25 MG per 12 hr capsule  Commonly known as:  AGGRENOX     Rivaroxaban 20 MG Tabs tablet  Commonly known as:  XARELTO      TAKE these medications       atorvastatin 40 MG tablet  Commonly known as:  LIPITOR  Take 40 mg by mouth every evening.     barrier cream Crea  Commonly known as:  non-specified  Apply 1 application topically 3 (three) times daily. To bottom after diaper change     calcium citrate 950 MG  tablet  Commonly known as:  CALCITRATE - dosed in mg elemental calcium  Take 1 tablet by mouth daily.     donepezil 10 MG tablet  Commonly known as:  ARICEPT  Take 10 mg by mouth every evening.     enoxaparin 40 MG/0.4ML injection  Commonly known as:  LOVENOX  Inject 0.4 mLs (40 mg total) into the skin daily.     fluticasone 50 MCG/ACT nasal spray  Commonly known as:  FLONASE  Place 2 sprays into the nose daily.     FOLBEE 2.5-25-1 MG Tabs tablet  Generic drug:  Folic Acid-Vit B6-Vit B12  Take 1 tablet by mouth daily.     furosemide 20 MG tablet  Commonly known as:  LASIX  Take 1 tablet (20 mg total) by mouth daily.     metoprolol succinate 12.5 mg Tb24 24 hr tablet  Commonly known as:  TOPROL-XL  Take 0.5 tablets (12.5 mg total) by mouth daily.     morphine CONCENTRATE 10 mg / 0.5 ml concentrated solution  Take 0.5 mLs (10 mg total) by mouth every 8 (eight) hours.     multivitamin with minerals Tabs tablet  Take 1 tablet by mouth daily.     polyethylene glycol powder powder  Commonly known as:  GLYCOLAX/MIRALAX  Take 17 g by mouth daily as needed. FOR CONSTIPATION     promethazine 25 MG tablet  Commonly known as:  PHENERGAN  Take 25 mg by mouth every 6 (six) hours as needed for nausea.     ranitidine 150 MG tablet  Commonly known as:  ZANTAC  Take 150 mg by mouth every evening.     Travoprost (BAK Free) 0.004 % Soln ophthalmic solution  Commonly known as:  TRAVATAN  Place 1 drop into both eyes at bedtime.     UTI-STAT Liqd  Take 30 mLs by mouth daily.     venlafaxine XR 75 MG 24 hr capsule  Commonly known as:  EFFEXOR-XR  Take 75 mg by mouth daily.     Vitamin D (Ergocalciferol) 50000 UNITS Caps capsule  Commonly known as:  DRISDOL  Take 50,000 Units by mouth every 30 (thirty) days. Give q 15th day of each month     ZESTRIL 2.5 MG tablet  Generic drug:  lisinopril  Take 1 tablet (2.5 mg total) by mouth daily.        Discharge Instructions: Please  refer to Patient Instructions section of EMR for full details.  Patient was counseled important signs and symptoms that should prompt return to medical care, changes in medications, dietary instructions, activity restrictions, and follow up appointments.   Follow-Up Appointments: Follow-up Information   Follow up with NORRIS,STEVEN R, MD. Schedule an appointment as soon as possible for a visit in 2 weeks. [phone])    Specialty:  Orthopedic Surgery   Contact information:   [address] [place] [address] [phone]       Follow up with Tana Conch, MD. Schedule an appointment as soon as possible for a visit in 2 weeks.   Specialty:  Family Medicine   Contact information:   [address] [place] [address] [phone]       Hazeline Junker, MD 01/25/2013, 1:41 PM PGY-1, [facility] Family [facility]

## 2013-01-25 NOTE — Progress Notes (Signed)
Orthopedics Progress Note  Subjective: Patient is still not talking, but seems more alert today.  Objective:  Filed Vitals:   01/25/13 0539  BP: 111/65  Pulse: 88  Temp: 97.4 F (36.3 C)  Resp: 24    General: Awake and alert  Musculoskeletal: right knee still aligned appropriately, compressive soft bandage in place Neurovascularly intact  Lab Results  Component Value Date   WBC 9.0 01/25/2013   HGB 7.5* 01/25/2013   HCT 23.8* 01/25/2013   MCV 87.5 01/25/2013   PLT 322 01/25/2013       Component Value Date/Time   NA 142 01/24/2013 1042   NA 147 05/24/2012   K 4.4 01/24/2013 1042   K 4.0 01/25/2012 1003   CL 114* 01/24/2013 1042   CL 111 01/25/2012 1003   CO2 20 01/24/2013 1042   CO2 25 01/25/2012 1003   GLUCOSE 142* 01/24/2013 1042   BUN 29* 01/24/2013 1042   BUN 26* 05/24/2012   BUN 30 01/13/2012 1627   CREATININE 0.92 01/24/2013 1042   CREATININE 0.7 05/24/2012   CREATININE 0.88 01/25/2012 1003   CALCIUM 8.5 01/24/2013 1042   CALCIUM 8.4 01/25/2012 1003   GFRNONAA 59* 01/24/2013 1042   GFRAA 68* 01/24/2013 1042    Lab Results  Component Value Date   INR 1.25 01/20/2013   INR 1.61* 01/14/2013    Assessment/Plan:  s/p distal femur fracture while in nursing home.  Due to concerns for the great potential for skin breakdown, we have elected to stabilize the femur with a soft compressive dressing rather than knee immobilizer.  This will function like a hydrostatic splint(functional splint)  It will not allow for mobilization so she will need to remain at bedrest.  This is the desire of her sister -- ie not for surgical management which would involve great perioperative risk. Understand plan to return to SNF Would need to see the patient in follow up in two weeks in the office   Viviann Spare R. Ranell Patrick, MD 01/25/2013 8:10 AM

## 2013-01-25 NOTE — Progress Notes (Signed)
I spoke with Dr. Jacky Kindle of Risk Management who informs me that the patient no longer meets criteria for hospitalization. I have called back to her sister, [name], and informed her that unfortunately Ms. Radilla will have to be discharged to a skilled nursing facility when a bed becomes available, which means if [facility] will [facility] unless the family appeals the discharge in which case they will assume the cost of additional hospital days.   Discharge order placed.  Team at [facility] including the DON [facility]),  Bo Merino attending (Dr. Randolm Idol) and resident notified.

## 2013-01-25 NOTE — Progress Notes (Signed)
Family Medicine Teaching [facility] Discharge Summary  Patient name: [name] Medical record number: [id number] Date of birth: [birth year] Age: 78 y.o. Gender: female Date of Admission: 01/20/2013  Date of Discharge: 01/25/2013 Admitting Physician: Barbaraann Barthel, MD  Primary Care Provider: Tana Conch, MD Consultants: Orthopedics, Palliative Care, Social Work  Indication for Hospitalization: Closed, peri-prosthetic R femur fracture  Discharge Diagnoses/Problem List:  Patient Active Problem List   Diagnosis Date Noted  . Closed fracture of right femur 01/24/2013  . Stroke 01/14/2013  . UTI (lower urinary tract infection) 01/14/2013  . Acute on chronic kidney failure 01/14/2013  . Anemia 05/25/2012  . DVT (deep venous thrombosis) 05/12/2012  . Mitral stenosis 02/15/2012  . Sacral pressure sore 02/15/2012  . At risk for diabetes mellitus 12/27/2011  . Hypertension 12/27/2011  . Aortic stenosis 12/11/2011  . CKD (chronic kidney disease) stage 2, GFR 60-89 ml/min 12/11/2011  . Breast neoplasm   . Glaucoma   . Vitamin D deficiency   . Hiatal hernia   . Obesity   . Hyperlipidemia   . Vascular dementia   . Depression   . Allergic rhinitis    Disposition: Discharged to return to [facility]  Discharge Condition: Stable  Discharge Exam:  General: 78 yo female laying in bed, in no acute distress  Cardiovascular: RRR, grade II/VI holosystolic murmur RUSB Respiratory: Non-labored, CTAB Abdomen: Obese, soft, non-tender, bowel sounds present  Extremities: warm, well perfused, RLE is wrapped and splinted, SCD present on LLE. Heel protectors B/L. NO major musculoskeletal defects elsewhere  Neuro: Alert, nonverbal, but interactive Skin: Sacral decubitus ulcer, 8cm x 7cm x 0.2cm on admission, covered by silicone dressing.   Brief Hospital Course:  [name] is a 78 y.o. female with an extensive past medical history including history of DVT and ischemic CVAs, vascular  dementia, and bilateral TKAs presenting on 11/7 with right femur fracture.  [name] had been [facility] after improvement in altered mental status, treatment for UTI, and transfusion for anemia. At [facility] the following day, she was found to have reddish-brown ecchymosis on the dorsolateral leg above the knee. An x-ray showed a fracture and she was admitted for management. A repeat x-ray showed a distal comminuted, displaced, peri-prosthetic right femur fracture. The mechanism of injury is still not known, but presumed to have occurred after discharge, as the discharge exam noted no ecchymosis.    An orthopedic joint specialist evaluated her and, in light of her dementia and bed-bound state, recommended no surgery. [name], the patient's sister and healthcare power of attorney, was involved in this decision and agrees that the risks outweigh the benefits of operative fixation. She is to be non weight-bearing for 10 weeks and to wear a soft compressive dressing. She was on xarelto prior to admission, and placed on lovenox and is to continue this on discharge.   Pain control was difficult to assess, as [name] is unable to communicate beyond intermittently responding to yes/no questions. She has an allergy of unknown severity to vicodin, but tolerated morphine 2mg  q4h prn and tylenol 650mg  q6h prn. This was transitioned to roxanol 1mg  po q8h, as pt cannot reliably request pain medications.    [name] continued to have a normocytic anemia, but did not require transfusion. Hemoglobin nadir was 7.0 and remained stable, rising to 7.9 prior to discharge.   She has a stage III sacral decubitus ulcer (8cm x 7cm x 0.2cm) present on  admission, for which a wound care nurse recommended a soft silicone dressing with air overlay to sacral decubitus. Antimicrobial textile to intertriginous dermatitis and heel protectors were also recommended.   Lipitor, low dose  lasix and beta blocker were continued. Lisinopril was held for borderline hypotension on arrival. Blood pressure has not been elevated throughout this admission.   Aricept and venlafaxine were continued for vascular dementia. Palliative care team was consulted, and family is  interested in continuing care at [facility].   A full course of treatment with ciprofloxacin was continued for a urinary tract infection  Issues for Follow Up:  - Pain control - Continued palliative care involvement - Keep patient's sister (healthcare POA) [name] informed about changes and developments at [phone]   Significant Procedures: None  Significant Labs and Imaging:   Recent Labs Lab 01/23/13 0615 01/24/13 1042 01/25/13 0602  WBC 8.0 10.0 9.0  HGB 7.0* 7.9* 7.5*  HCT 21.9* 25.4* 23.8*  PLT 312 352 322    Recent Labs Lab 01/20/13 1527 01/21/13 0600 01/22/13 0515 01/23/13 0615 01/24/13 1042  NA 146* 149* 148* 143 142  K 4.2 4.2 4.4 3.9 4.4  CL 116* 120* 118* 116* 114*  CO2 20 18* 18* 19 20  GLUCOSE 170* 149* 120* 153* 142*  BUN 39* 39* 38* 34* 29*  CREATININE 1.14* 1.18* 1.10 1.00 0.92  CALCIUM 8.7 8.4 8.8 7.9* 8.5  ALKPHOS 119*  --   --   --   --   AST 61*  --   --   --   --   ALT 98*  --   --   --   --   ALBUMIN 2.0*  --   --   --   --    Right Femur X-ray (01/20/2013)  FINDINGS:  Significant osteophyte formation right hip joint. Right total knee  replacement. There is an oblique fracture involving the distal 3rd  of the femoral shaft. Fracture is mildly comminuted with apex medial  angulation. Distal fracture fragment is displaced anteriorly by  approximately 1/3 shaft with, and displaced laterally by about 1/2  shaft's with. There is calcified material in the suprapatellar  recess of the joint.  IMPRESSION:  Acute fracture distal femoral shaft. Irregularity of the subcapital  medial femur appears to be more likely due to a band of osteophyte  formation rather than acute  fracture in this location.  Bilateral Hip X-ray (01/20/2013)  FINDINGS:  The bones of the pelvis are intact. There is moderate bilateral hip  arthritis. On the frontal view, there is abnormal contour to the  medial right femoral neck and subcapital area. On lateral view  however, this area shows an intact cortex with no evidence of  fracture.  IMPRESSION:  No definite fractures. Abnormal appearing right subcapital femur on  the frontal view appears to be due to a band of osteophytes. The  lateral view does not confirm the presence of a fracture. If there  is reasonable clinical suspicion for right femur fracture, a CT  could be considered.  Right Knee X-ray (01/20/2013)  FINDINGS:  The bones of the pelvis are intact. There is moderate bilateral hip  arthritis. On the frontal view, there is abnormal contour to the  medial right femoral neck and subcapital area. On lateral view  however, this area shows an intact cortex with no evidence of  fracture.  IMPRESSION:  No definite fractures. Abnormal appearing right subcapital femur on  the frontal view appears to be  due to a band of osteophytes. The  lateral view does not confirm the presence of a fracture. If there  is reasonable clinical suspicion for right femur fracture, a CT  could be considered.  Results/Tests Pending at Time of Discharge: None  Discharge Medications:    Medication List    STOP taking these medications       ciprofloxacin 500 MG tablet  Commonly known as:  CIPRO     dipyridamole-aspirin 200-25 MG per 12 hr capsule  Commonly known as:  AGGRENOX     Rivaroxaban 20 MG Tabs tablet  Commonly known as:  XARELTO      TAKE these medications       atorvastatin 40 MG tablet  Commonly known as:  LIPITOR  Take 40 mg by mouth every evening.     barrier cream Crea  Commonly known as:  non-specified  Apply 1 application topically 3 (three) times daily. To bottom after diaper change     calcium citrate 950 MG  tablet  Commonly known as:  CALCITRATE - dosed in mg elemental calcium  Take 1 tablet by mouth daily.     donepezil 10 MG tablet  Commonly known as:  ARICEPT  Take 10 mg by mouth every evening.     enoxaparin 40 MG/0.4ML injection  Commonly known as:  LOVENOX  Inject 0.4 mLs (40 mg total) into the skin daily.     fluticasone 50 MCG/ACT nasal spray  Commonly known as:  FLONASE  Place 2 sprays into the nose daily.     FOLBEE 2.5-25-1 MG Tabs tablet  Generic drug:  Folic Acid-Vit B6-Vit B12  Take 1 tablet by mouth daily.     furosemide 20 MG tablet  Commonly known as:  LASIX  Take 1 tablet (20 mg total) by mouth daily.     metoprolol succinate 12.5 mg Tb24 24 hr tablet  Commonly known as:  TOPROL-XL  Take 0.5 tablets (12.5 mg total) by mouth daily.     morphine CONCENTRATE 10 mg / 0.5 ml concentrated solution  Take 0.5 mLs (10 mg total) by mouth every 8 (eight) hours.     multivitamin with minerals Tabs tablet  Take 1 tablet by mouth daily.     polyethylene glycol powder powder  Commonly known as:  GLYCOLAX/MIRALAX  Take 17 g by mouth daily as needed. FOR CONSTIPATION     promethazine 25 MG tablet  Commonly known as:  PHENERGAN  Take 25 mg by mouth every 6 (six) hours as needed for nausea.     ranitidine 150 MG tablet  Commonly known as:  ZANTAC  Take 150 mg by mouth every evening.     Travoprost (BAK Free) 0.004 % Soln ophthalmic solution  Commonly known as:  TRAVATAN  Place 1 drop into both eyes at bedtime.     UTI-STAT Liqd  Take 30 mLs by mouth daily.     venlafaxine XR 75 MG 24 hr capsule  Commonly known as:  EFFEXOR-XR  Take 75 mg by mouth daily.     Vitamin D (Ergocalciferol) 50000 UNITS Caps capsule  Commonly known as:  DRISDOL  Take 50,000 Units by mouth every 30 (thirty) days. Give q 15th day of each month     ZESTRIL 2.5 MG tablet  Generic drug:  lisinopril  Take 1 tablet (2.5 mg total) by mouth daily.        Discharge Instructions: Please  refer to Patient Instructions section of EMR for full details.  Patient was counseled important signs and symptoms that should prompt return to medical care, changes in medications, dietary instructions, activity restrictions, and follow up appointments.   Follow-Up Appointments: Follow-up Information   Follow up with NORRIS,STEVEN R, MD. Schedule an appointment as soon as possible for a visit in 2 weeks. [phone]    Specialty:  Orthopedic Surgery   Contact information:   [address] [place] [address] [phone]       Follow up with Tana Conch, MD. Schedule an appointment as soon as possible for a visit in 2 weeks.   Specialty:  Family Medicine   Contact information:   [address] [place] [address] [phone]       Hazeline Junker, MD 01/25/2013, 1:41 PM PGY-1, [facility]

## 2013-01-25 NOTE — Progress Notes (Signed)
Family Medicine Teaching Service Daily Progress Note Intern Pager: [phone]  Patient name: [name] Medical record number: [id number] Date of birth: 01-May-1934 Age: 78 y.o. Gender: female  Primary Care Provider: Tana Conch, MD Consultants: Orthopedic surgery, palliative care, WOC Code Status: Full Code  Pt Overview and Major Events to Date:  11/7: Admitted with R femur fracture 11/8: Considering surgical options, pain control 11/10 Orthopedics, with agreement from healthcare power of attorney, suggests non-operative management. 11/11: Seeking readmission to [facility], switch to oral pain control  Assessment and Plan: [name] is a 78 y.o. female presenting with right femur fracture. PMH is significant for ischemic CVAs, vascular dementia, aphasia, bilateral TKAs, history of DVTs, and recent admission for AMS, anemia, and UTI.   # Femur fracture: Distal comminuted, displaced, peri-prosthetic femur fracture. Unknown cause.  - Orthopedic joint specialist recommends no surgery, NWB for 10 weeks for fracture healing with external brace. Sister, PoA, agrees with this plan for pain control and medical management.   - Roxanol 10mg  po q4h prn - Vit D (ergocalciferol) 50,000u daily. Ca wnl. Vit D 37 (wnl). History of Vit D deficiency.  - No unstable VS's to suggest fat embolus. On Lovenox  # Anemia: Normocytic. History of chronic anemia. Required transfusion during previous hospitalization.  - Hgb: 7.9 > 7.0 > 7.0 > 7.0 > 7.9; will not recheck  # Hypernatremia and hyperchloremia: Improved.   # Sacral decubitus ulcer, stage III 8cm x 7cm x 0.2cm, present on admission - Wound care recommendations appreciated: Soft silicone dressing with air overlay to sacral decubitus. Antimicrobial textile to intertriginous dermatitis.  - Continue prevalon heel protectors  # HTN: Echo 09/08/2012 showed EF 55-60%, inadequate study to evaluate diastolic dysfunction.  - Hold lisinopril for intermittent  hypotension.  - Restarted low dose lasix - Continue low dose beta-blocker - Continue lipitor   # History of CVA, vascular dementia, aphasia: Aricept, venlafaxine  - Palliative care team consulted: involved in previous admission, and family interested in continuing care  # CKD stage II: Cr 1.18 > 1.00, will continue with only oral fluids # Urinary tract infection: finished full course of treatment with ciprofloxacin # Leukocytosis: Resolved WBC 13.7 > 9.1 > 8.0. Tmax 98.62F # Glaucoma: Continue latanoprost qHS # Social: To keep patient's sister (healthcare POA) Colvin Caroli informed about changes and developments at [phone]  FEN/GI: Dysphagia diet eating well, KVO PPx: lovenox; SCD on L leg  Disposition: Pending placement back at [facility]. CSW consulted today.  Subjective: Patient found sleeping quietly. Appears comfortable. No family at bedside this morning.  Objective: Temp:  [97.4 F (36.3 C)-98.6 F (37 C)] 97.4 F (36.3 C) (11/12 0539) Pulse Rate:  [88-98] 88 (11/12 0539) Resp:  [20-24] 24 (11/12 0539) BP: (108-118)/(43-86) 111/65 mmHg (11/12 0539) SpO2:  [95 %-100 %] 100 % (11/12 0539)  Physical Exam: General: 78 yo female laying in bed, in no acute distress Cardiovascular: RRR, grade II/VI holosystolic murmur RUSB, S4 noted Respiratory: Non-labored, crackles at bases at mid-axillary line bilaterally Abdomen: Obese, soft, non-tender, bowel sounds present Extremities: warm, well perfused, RLE is wrapped and splinted, SCD present on LLE. Heel protectors B/L Neuro: Alert, essentially nonverbal but somewhat interactive. No obvious focal deficits.   Laboratory:  Recent Labs Lab 01/23/13 0615 01/24/13 1042 01/25/13 0602  WBC 8.0 10.0 9.0  HGB 7.0* 7.9* 7.5*  HCT 21.9* 25.4* 23.8*  PLT 312 352 322    Recent Labs Lab 01/20/13 1527  01/22/13 0515 01/23/13 0615 01/24/13  1042  NA 146*  < > 148* 143 142  K 4.2  < > 4.4 3.9 4.4  CL 116*  < > 118* 116* 114*   CO2 20  < > 18* 19 20  BUN 39*  < > 38* 34* 29*  CREATININE 1.14*  < > 1.10 1.00 0.92  CALCIUM 8.7  < > 8.8 7.9* 8.5  PROT 6.4  --   --   --   --   BILITOT 1.1  --   --   --   --   ALKPHOS 119*  --   --   --   --   ALT 98*  --   --   --   --   AST 61*  --   --   --   --   GLUCOSE 170*  < > 120* 153* 142*  < > = values in this interval not displayed. Imaging/Diagnostic Tests:  Right Femur X-ray (01/20/2013) FINDINGS:  Significant osteophyte formation right hip joint. Right total knee  replacement. There is an oblique fracture involving the distal 3rd  of the femoral shaft. Fracture is mildly comminuted with apex medial  angulation. Distal fracture fragment is displaced anteriorly by  approximately 1/3 shaft with, and displaced laterally by about 1/2  shaft's with. There is calcified material in the suprapatellar  recess of the joint.  IMPRESSION:  Acute fracture distal femoral shaft. Irregularity of the subcapital  medial femur appears to be more likely due to a band of osteophyte  formation rather than acute fracture in this location.  Bilateral Hip X-ray (01/20/2013) FINDINGS:  The bones of the pelvis are intact. There is moderate bilateral hip  arthritis. On the frontal view, there is abnormal contour to the  medial right femoral neck and subcapital area. On lateral view  however, this area shows an intact cortex with no evidence of  fracture.  IMPRESSION:  No definite fractures. Abnormal appearing right subcapital femur on  the frontal view appears to be due to a band of osteophytes. The  lateral view does not confirm the presence of a fracture. If there  is reasonable clinical suspicion for right femur fracture, a CT  could be considered.  Right Knee X-ray (01/20/2013) FINDINGS:  The bones of the pelvis are intact. There is moderate bilateral hip  arthritis. On the frontal view, there is abnormal contour to the  medial right femoral neck and subcapital area. On lateral  view  however, this area shows an intact cortex with no evidence of  fracture.  IMPRESSION:  No definite fractures. Abnormal appearing right subcapital femur on  the frontal view appears to be due to a band of osteophytes. The  lateral view does not confirm the presence of a fracture. If there  is reasonable clinical suspicion for right femur fracture, a CT  could be considered.  Hazeline Junker, MD 01/25/2013, 8:55 AM PGY-1, [facility] Family Medicine FPTS Intern pager: [phone], text pages welcome

## 2013-01-25 NOTE — Clinical Social Work Note (Signed)
Clinical Social Work Department BRIEF PSYCHOSOCIAL ASSESSMENT 01/25/2013  Patient:  TEXAS, OBORN     Account Number:  [id number]     Admit date:  01/20/2013  Clinical Social Worker:  Verl Blalock  Date/Time:  01/25/2013 02:20 PM  Referred by:  Physician  Date Referred:  01/25/2013 Referred for  SNF Placement   Other Referral:   Interview type:  Family Other interview type:   Patient sister    PSYCHOSOCIAL DATA Living Status:  FACILITY Admitted from facility:  [facility] Level of care:  Skilled Nursing Facility Primary support name:  Cora Collum  [phone] Primary support relationship to patient:  SIBLING Degree of support available:   Strong    CURRENT CONCERNS Current Concerns  Post-Acute Placement   Other Concerns:    SOCIAL WORK ASSESSMENT / PLAN Clinical Social Worker spoke with patient sister over the phone to offer support and discuss patient needs at discharge.  Patient sister states that patient has been a resident at [facility] for the last year and with her recent hospitalizations has considered alternate placement. Patient sister has requested CSW to send referral to [facility] - denial provided.  CSW relayed information to patient sister who is now in full agreement with return to [facility].  CSW has spoken with facility and provided all necessary documentation for patient return.  CSW will facilitate patient discharge needs once medically ready.   Assessment/plan status:  Psychosocial Support/Ongoing Assessment of Needs Other assessment/ plan:   Information/referral to community resources:   Visual merchandiser offered patient sister the opportunity to look into new facilities but ultimately she has decided on patient return to [facility].    PATIENT'S/FAMILY'S RESPONSE TO PLAN OF CARE: Patient is oriented to person and place only at this time. CSW spoke with patient sister who is patient primary support and seems to have  patient best interest at hand. Patient sister plans to follow up with Risk Management in regards to patient recent femur fracture.  Patient verbalized her understanding for patient need to return today and verbalized her appreciation for CSW support and concern.

## 2013-01-25 NOTE — Progress Notes (Signed)
Attending Addendum  I examined the patient and discussed the assessment and plan with Dr. Jarvis [name]. I have reviewed the note and agree.  [name] is unchanged today. She has a slight decreased in her Hgb but it remains above 7. Regarding disposition, the working plan is to return to [facility] nursing home. She will require an alternating pressure mattress for sacral decub, wound care, , scheduled narcotics for pain control, and palliative care services while in the nursing home. I have bee in touch with the Family Medicine Service attending at [facility] nursing home to inform him of this. I have also spoken to, [name], [name] sister to up date her today. The earliest discharge will be tomorrow, 01/26/13, to [facility], if she has the mattress she needs. If Ms, [name] opts to have [name] sent to a different facility discharge will be based on be availability.   [name] Concerns: 1. Pain control- scheduled morphine 2. Fracture stability- soft external bracing. [name] is concerned that the fracture is not stable/healed. I assured her that it will take > 2 months for the fracture to heal, medically she is stable for discharge. Regarding transfer and turns, wherever [name] goes, the staff will need to be careful and mindful unfortunately the in patient care team does not have control over how careful the staff is at the skilled nursing facility.  3. PT- patient cannot participate in PT ( advanced dementia, fracture, non-weightbearing on R side).      Carla Phi, MD FAMILY MEDICINE TEACHING SERVICE

## 2013-01-26 ENCOUNTER — Non-Acute Institutional Stay (INDEPENDENT_AMBULATORY_CARE_PROVIDER_SITE_OTHER): Payer: Medicare Other | Admitting: Family Medicine

## 2013-01-26 ENCOUNTER — Telehealth: Payer: Self-pay | Admitting: Family Medicine

## 2013-01-26 DIAGNOSIS — N182 Chronic kidney disease, stage 2 (mild): Secondary | ICD-10-CM

## 2013-01-26 DIAGNOSIS — I359 Nonrheumatic aortic valve disorder, unspecified: Secondary | ICD-10-CM

## 2013-01-26 DIAGNOSIS — I10 Essential (primary) hypertension: Secondary | ICD-10-CM

## 2013-01-26 DIAGNOSIS — D649 Anemia, unspecified: Secondary | ICD-10-CM

## 2013-01-26 DIAGNOSIS — IMO0002 Reserved for concepts with insufficient information to code with codable children: Secondary | ICD-10-CM

## 2013-01-26 DIAGNOSIS — I350 Nonrheumatic aortic (valve) stenosis: Secondary | ICD-10-CM

## 2013-01-26 DIAGNOSIS — L8990 Pressure ulcer of unspecified site, unspecified stage: Secondary | ICD-10-CM

## 2013-01-26 DIAGNOSIS — S7291XA Unspecified fracture of right femur, initial encounter for closed fracture: Secondary | ICD-10-CM | POA: Insufficient documentation

## 2013-01-26 DIAGNOSIS — L89159 Pressure ulcer of sacral region, unspecified stage: Secondary | ICD-10-CM

## 2013-01-26 DIAGNOSIS — S7291XP Unspecified fracture of right femur, subsequent encounter for closed fracture with malunion: Secondary | ICD-10-CM

## 2013-01-26 DIAGNOSIS — L89109 Pressure ulcer of unspecified part of back, unspecified stage: Secondary | ICD-10-CM

## 2013-01-26 DIAGNOSIS — F0150 Vascular dementia without behavioral disturbance: Secondary | ICD-10-CM

## 2013-01-26 NOTE — Telephone Encounter (Signed)
Pt in pain w/o pain medications due to fentanyl patches not having arrived yet.  Perc x1 eariler this afternoon Pts nurse and family believes pt in pain - ordered perc 5/325 Q4 scheduled  Shelly Flatten, MD Family Medicine PGY-3 01/26/2013, 7:15 PM

## 2013-01-26 NOTE — Assessment & Plan Note (Signed)
Con't wound care and gentle repositioning

## 2013-01-26 NOTE — Assessment & Plan Note (Signed)
Con't Lisinopril. Monitor BP per standard protocol

## 2013-01-26 NOTE — Assessment & Plan Note (Signed)
Good hydration. BMet next week.

## 2013-01-26 NOTE — Assessment & Plan Note (Signed)
Will get palliative care consult. Con't Aricept.

## 2013-01-26 NOTE — Assessment & Plan Note (Signed)
Con't to monitor. Echo is UTD.

## 2013-01-26 NOTE — Progress Notes (Signed)
Patient ID: [name], female   DOB: [birth year], 78 y.o.   MRN: [id number]  [name] Family Medicine Nursing Home History and Physical Service Pager: [phone]  Patient name: [name] Medical record number: [id number] Date of birth: 1934-04-23 Age: 78 y.o. Gender: female  Primary Care Provider: Tana Conch, MD Consultants: [name] is a 78 y.o. female readmitted to the nursing home after two hospital stays at [name]. Patient was admitted on 01/14/13 for acute encephalopathy, and found to have AKI, UTI and be severely anemic. She received blood transfusion and appropriate management of other medical conditions. She was discharged in stable condition back to [name] on the evening of 01/19/13 on all prior to admission medications. The following morning, she was evaluated by [employer] NP and was found to be in pain with ecchymosis of her right lower extremity. Mobile X-ray was significant for femur fracture around her knee prosthesis, and she was therefore readmitted to the hospital for further management of this issue. It was determined by the FPTS, ortho and HCPOA that [name] was not a surgical candidate and medical management would be used for the fracture, and she was discharged once again to [name] on the evening on 01/25/13.   Femur fracture: Patient was evaluated by ortho and total joint specialist during hospitalization. She will continue medical management including soft casting, non-weight bearing, good pain control and close outpatient follow up with ortho. Patient is non-ambulatory at baseline. Her pain was adequately controlled with Morphine in the hospital, but it was decided to give Tramadol prn at discharge. She is unable to ask for medication, so after discussion between [employer] NP and family, patient will have Fentanyl Duragesic patch for continuous pain relief. We will monitor closely for over sedation and respiratory status. Patient's sister is  comfortable with this plan. Will decrease pain control if needed. Also, air mattress provided by facility. [employer] has ordered a different mattress covered by their company. Will follow up with Ortho in 2 weeks, which will be arranged by nursing home staff.   Dementia: Progressively worsening. Con't Aricept. Sister, [name], is aware of the degree of dementia and has asked for Palliative Care Consult. This has been ordered by [employer]. Continue Effexor for depressive symptoms.  H/o DVT: Risks/benefits explored. Known DVT in the past, now with femur fracture and will be even less mobile than before. Resumed on Xarelto at discharge because of non-surgical management. Monitor for bleeding. CBC in one week.  Anemia: Recent blood transfusions due to low HgB. Will need to monitor closely for signs of bleeding. CBC next week.  Kidney disease: Known CKD stage 2. Had recent AKI due to poor PO intake. Needs assistance with meals. Prostat added prior to meals. Monitor fluid intake. Bmet with routine labs.  Sacral Ulcer: Con't wound care, air mattress and repositioning as tolerated.   HTN: Continue Lisinopril. Monitor BP per protocol   Review Of Systems: Per HPI. Patient unable to verbalize any concerns. Otherwise 12 point review of systems was performed and was unremarkable.  Patient Active Problem List   Diagnosis Date Noted  . Closed fracture of right femur 01/24/2013  . Stroke 01/14/2013  . UTI (lower urinary tract infection) 01/14/2013  . Acute on chronic kidney failure 01/14/2013  . Anemia 05/25/2012  . DVT (deep venous thrombosis) 05/12/2012  . Mitral stenosis 02/15/2012  . Sacral pressure sore 02/15/2012  . At risk for diabetes mellitus 12/27/2011  . Hypertension 12/27/2011  .  Aortic stenosis 12/11/2011  . CKD (chronic kidney disease) stage 2, GFR 60-89 ml/min 12/11/2011  . Breast neoplasm   . Glaucoma   . Vitamin D deficiency   . Hiatal hernia   . Obesity   . Hyperlipidemia   . Vascular  dementia   . Depression   . Allergic rhinitis    Past Medical History: Past Medical History  Diagnosis Date  . Aphasia   . Muscle weakness   . Malaise   . Fatigue   . Breast neoplasm   . Hypertension   . Glaucoma   . UTI (lower urinary tract infection)   . Vitamin D deficiency   . Hiatal hernia   . Constipation   . Diarrhea   . Asthma   . Obesity   . Hyperlipidemia   . Alzheimer's dementia   . Depression   . Allergic rhinitis   . Dementia   . DVT (deep venous thrombosis) 2014    "put her on Xeralto" (01/20/2013)  . Pneumonia 2014  . Shortness of breath     "lately; just laying there" (01/20/2013)  . Type II diabetes mellitus   . Anemia   . History of blood transfusion     "got a unit yesterday" (01/20/2013)  . Stroke 2006; 2007    residual:  "mute, dementia, generalized weakness" (01/20/2013)  . Recurrent UTI     "at least 3 since 2008 when she went into nursing home" (01/20/2013)  . Breast cancer     "right" (01/20/2013)   Past Surgical History: Past Surgical History  Procedure Laterality Date  . Joint replacement      left knee in '90s  . Breast lumpectomy Right 1990's; 2000's    "she's had 2" (01/20/2013)  . Replacement total knee bilateral Bilateral 1990's   Social History: History  Substance Use Topics  . Smoking status: Never Smoker   . Smokeless tobacco: Never Used  . Alcohol Use: No   Additional social history: Her sister, [name], is HCPOA.  Please also refer to relevant sections of EMR.  Family History: No family history on file. Allergies and Medications: Allergies  Allergen Reactions  . Penicillins Other (See Comments)    Per MAR  . Tetanus Toxoids Other (See Comments)    Per MAR  . Vicodin [Hydrocodone-Acetaminophen] Other (See Comments)    Per [facility]   Current Outpatient Prescriptions on File Prior to Visit  Medication Sig Dispense Refill  . acetaminophen (TYLENOL 8 HOUR) 650 MG CR tablet Take 1 tablet (650 mg total) by mouth every 8 (eight)  hours.      . Amino Acids-Protein Hydrolys (FEEDING SUPPLEMENT, PRO-STAT 64,) LIQD Take 30 mLs by mouth 4 (four) times daily -  with meals and at bedtime.      Marland Kitchen aspirin 81 MG tablet Take 1 tablet (81 mg total) by mouth daily.  30 tablet    . atorvastatin (LIPITOR) 40 MG tablet Take 40 mg by mouth every evening.      . barrier cream (NON-SPECIFIED) CREA Apply 1 application topically 3 (three) times daily. To bottom after diaper change      . calcium citrate (CALCITRATE - DOSED IN MG ELEMENTAL CALCIUM) 950 MG tablet Take 1 tablet by mouth daily.      Marland Kitchen donepezil (ARICEPT) 10 MG tablet Take 10 mg by mouth every evening.      . Folic Acid-Vit B6-Vit B12 (FOLBEE) 2.5-25-1 MG TABS Take 1 tablet by mouth daily.      Marland Kitchen  furosemide (LASIX) 20 MG tablet Take 1 tablet (20 mg total) by mouth daily.  30 tablet  0  . lisinopril (ZESTRIL) 2.5 MG tablet Take 1 tablet (2.5 mg total) by mouth daily.      . metoprolol succinate (TOPROL-XL) 12.5 mg TB24 24 hr tablet Take 0.5 tablets (12.5 mg total) by mouth daily.  30 tablet  0  . Multiple Vitamin (MULTIVITAMIN WITH MINERALS) TABS tablet Take 1 tablet by mouth daily.      . polyethylene glycol powder (GLYCOLAX/MIRALAX) powder Take 17 g by mouth daily as needed. FOR CONSTIPATION      . ranitidine (ZANTAC) 150 MG tablet Take 150 mg by mouth every evening.      . traMADol (ULTRAM) 50 MG tablet Take 1 tablet (50 mg total) by mouth every 6 (six) hours as needed.  60 tablet  2  . Travoprost, BAK Free, (TRAVATAN) 0.004 % SOLN ophthalmic solution Place 1 drop into both eyes at bedtime.      Marland Kitchen venlafaxine XR (EFFEXOR-XR) 75 MG 24 hr capsule Take 75 mg by mouth daily.      . Vitamin D, Ergocalciferol, (DRISDOL) 50000 UNITS CAPS Take 50,000 Units by mouth every 30 (thirty) days. Give q 15th day of each month      . promethazine (PHENERGAN) 25 MG tablet Take 25 mg by mouth every 6 (six) hours as needed for nausea.      . Rivaroxaban (XARELTO) 20 MG TABS tablet Take 1 tablet (20  mg total) by mouth daily with supper.  30 tablet     No current facility-administered medications on file prior to visit.    Objective: Nursing home vital signs reviewed Physical Exam: General: In bed, nonverbal. Grimace on face. HEENT: Somewhat dry mucus membranes with food particles on tongue. Tracks well with eyes. Cardiovascular: 2/6 murmur, regular rhythm  Respiratory: Anterior ausculation clear. Abdomen: Soft, nontender Extremities: Right extremity wrapped with soft cast from just above knee to toes. Toes are warm and well perfused. Some ?edema above dressing. Feet in pressure boots Skin: Unable to examine sacral wound. No other breakdown noted. Neuro: Non-verbal, does not follow commands. Not participatory with exam.  Labs and Imaging: CBC BMET   Recent Labs Lab 01/25/13 0602  WBC 9.0  HGB 7.5*  HCT 23.8*  PLT 322    Recent Labs Lab 01/24/13 1042  NA 142  K 4.4  CL 114*  CO2 20  BUN 29*  CREATININE 0.92  GLUCOSE 142*  CALCIUM 8.5     Dg Hip Bilateral W/pelvis  01/20/2013   CLINICAL DATA:  Ecchymosis right femur, pain  EXAM: BILATERAL HIP WITH PELVIS - 4+ VIEW  COMPARISON:  None  FINDINGS: The bones of the pelvis are intact. There is moderate bilateral hip arthritis. On the frontal view, there is abnormal contour to the medial right femoral neck and subcapital area. On lateral view however, this area shows an intact cortex with no evidence of fracture.  IMPRESSION: No definite fractures. Abnormal appearing right subcapital femur on the frontal view appears to be due to a band of osteophytes. The lateral view does not confirm the presence of a fracture. If there is reasonable clinical suspicion for right femur fracture, a CT could be considered.   Electronically Signed   By: Esperanza Heir M.D.   On: 01/20/2013 21:05   Dg Femur Right  01/20/2013   CLINICAL DATA:  Bruising over right femur, pain  EXAM: RIGHT FEMUR - 2 VIEW  COMPARISON:  None.  FINDINGS: Significant  osteophyte formation right hip joint. Right total knee replacement. There is an oblique fracture involving the distal 3rd of the femoral shaft. Fracture is mildly comminuted with apex medial angulation. Distal fracture fragment is displaced anteriorly by approximately 1/3 shaft with, and displaced laterally by about 1/2 shaft's with. There is calcified material in the suprapatellar recess of the joint.  IMPRESSION: Acute fracture distal femoral shaft. Irregularity of the subcapital medial femur appears to be more likely due to a band of osteophyte formation rather than acute fracture in this location.   Electronically Signed   By: Esperanza Heir M.D.   On: 01/20/2013 21:07   Dg Knee 1-2 Views Right  01/20/2013   CLINICAL DATA:  Fall, pain  EXAM: RIGHT KNEE - 1-2 VIEW  COMPARISON:  None.  FINDINGS: Total knee prosthesis. Calcified hematoma in the suprapatellar aspect of the joint. Oblique fracture distal 3rd of the femoral shaft described in full detail on report of the femur.  IMPRESSION: Comminuted displaced angulated distal femur fracture.   Electronically Signed   By: Esperanza Heir M.D.   On: 01/20/2013 21:08   Dg Chest Port 1 View  01/23/2013   CLINICAL DATA:  Pulmonary vascular congestion. Elevated white blood count. Crackles at the lung bases on physical exam.  EXAM: PORTABLE CHEST - 1 VIEW  COMPARISON:  Chest x-rays dated 01/19/2013 and 01/18/2013 and 12/05/2011  FINDINGS: Heart size is normal. Pulmonary vascularity is now normal considering the shallow inspiration. New slight area of atelectasis at the right base. Density at the left base most likely represents a small pleural effusion. Dense calcification in the mitral valve annulus is noted.  IMPRESSION: New slight atelectasis the right lung base. Probable small left effusion.   Electronically Signed   By: Geanie Cooley M.D.   On: 01/23/2013 10:13    Amber Nydia Bouton, MD 01/26/2013, 10:22 AM PGY-3, [facility] Family Medicine

## 2013-01-26 NOTE — Assessment & Plan Note (Signed)
S/p blood transfusion, restarted on Xarelto. Monitor for bleeding. CBC next week.

## 2013-01-26 NOTE — Assessment & Plan Note (Signed)
Non weight bearing, con't to keep soft cast in place. Fentanyl patch for pain control, discussed with [name] and [employer] NP. F/u with Ortho within next 2 weeks. Palliative consult pending.

## 2013-01-27 ENCOUNTER — Non-Acute Institutional Stay: Payer: Medicare Other | Admitting: Family Medicine

## 2013-01-27 DIAGNOSIS — D649 Anemia, unspecified: Secondary | ICD-10-CM

## 2013-01-27 DIAGNOSIS — I82409 Acute embolism and thrombosis of unspecified deep veins of unspecified lower extremity: Secondary | ICD-10-CM

## 2013-01-27 DIAGNOSIS — I10 Essential (primary) hypertension: Secondary | ICD-10-CM

## 2013-01-27 DIAGNOSIS — F0150 Vascular dementia without behavioral disturbance: Secondary | ICD-10-CM

## 2013-01-27 NOTE — Assessment & Plan Note (Signed)
Severe vascular dementia. Palliative care has been consulted for further recommendations and discussion of goals of care.

## 2013-01-27 NOTE — Assessment & Plan Note (Signed)
Medical management per orthopedic surgery. Agree with recent resident documentation of plan of care. Will treat pain with fentanyl patch and when necessary tramadol.

## 2013-01-27 NOTE — Assessment & Plan Note (Signed)
Continue local wound care and repositioning.

## 2013-01-27 NOTE — Assessment & Plan Note (Signed)
Most recent hemoglobin was 7.5 on 01/25/2013. Patient is status post blood transfusion while hospitalized. Agree with resident plan to monitor for signs of bleeding and recheck a CBC next week.

## 2013-01-27 NOTE — Progress Notes (Signed)
Patient ID: [name], female   DOB: 12-12-1934, 78 y.o.   MRN: [id number] Family Medicine Teaching Service Nursing Home Admission Note  Patient name: [name] Medical record number: [id number] Date of birth: 07-Jan-1935 Age: 78 y.o. Gender: female  Primary Care Provider: Tana Conch, MD  Chief Complaint: Readmission to [facility] nursing home status post right distal femur fracture History of Present Illness: [name] is a 78 y.o. year old female with past medical history as outlined below who was recently admitted to [facility] [place] on 01/14/2013 4 acute encephalopathy secondary to UTI and acute kidney injury. Patient had been recently discharged back to [facility] nursing home on 01/19/2013 and found to have ecchymosis of her right lower extremity. Imaging at that time identified a right distal femur fracture. Patient was readmitted back to [facility] family [facility] teaching service. Orthopedic surgery was consulted and they deemed the patient to be a nonsurgical candidate. Please refer to resident dictation performed 01/26/2013 for additional history of present illness.  Patient is seen at bedside and is unable to provide any additional history of present illness due to her severe dementia. Family is not present  Patient Active Problem List   Diagnosis Date Noted  . Femur fracture, right 01/26/2013  . Closed fracture of right femur 01/24/2013  . Stroke 01/14/2013  . UTI (lower urinary tract infection) 01/14/2013  . Acute on chronic kidney failure 01/14/2013  . Anemia 05/25/2012  . DVT (deep venous thrombosis) 05/12/2012  . Mitral stenosis 02/15/2012  . Sacral pressure sore 02/15/2012  . At risk for diabetes mellitus 12/27/2011  . Hypertension 12/27/2011  . Aortic stenosis 12/11/2011  . CKD (chronic kidney disease) stage 2, GFR 60-89 ml/min 12/11/2011  . Breast neoplasm   . Glaucoma   . Vitamin D deficiency   . Hiatal hernia   . Obesity   . Hyperlipidemia   . Vascular  dementia   . Depression   . Allergic rhinitis    Past Medical History: Past Medical History  Diagnosis Date  . Aphasia   . Muscle weakness   . Malaise   . Fatigue   . Breast neoplasm   . Hypertension   . Glaucoma   . UTI (lower urinary tract infection)   . Vitamin D deficiency   . Hiatal hernia   . Constipation   . Diarrhea   . Asthma   . Obesity   . Hyperlipidemia   . Alzheimer's dementia   . Depression   . Allergic rhinitis   . Dementia   . DVT (deep venous thrombosis) 2014    "put her on Xeralto" (01/20/2013)  . Pneumonia 2014  . Shortness of breath     "lately; just laying there" (01/20/2013)  . Type II diabetes mellitus   . Anemia   . History of blood transfusion     "got a unit yesterday" (01/20/2013)  . Stroke 2006; 2007    residual:  "mute, dementia, generalized weakness" (01/20/2013)  . Recurrent UTI     "at least 3 since 2008 when she went into nursing home" (01/20/2013)  . Breast cancer     "right" (01/20/2013)    Past Surgical History: Past Surgical History  Procedure Laterality Date  . Joint replacement      left knee in '90s  . Breast lumpectomy Right 1990's; 2000's    "she's had 2" (01/20/2013)  . Replacement total knee bilateral Bilateral 1990's    Social History: History   Social History  .  Marital Status: Widowed    Spouse Name: N/A    Number of Children: N/A  . Years of Education: N/A   Social History Main Topics  . Smoking status: Never Smoker   . Smokeless tobacco: Never Used  . Alcohol Use: No  . Drug Use: No  . Sexual Activity: No   Other Topics Concern  . Not on file   Social History Narrative   [name] is POA [phone], cell [phone]    Family History: No family history on file.  Allergies: Allergies  Allergen Reactions  . Penicillins Other (See Comments)    Per MAR  . Tetanus Toxoids Other (See Comments)    Per MAR  . Vicodin [Hydrocodone-Acetaminophen] Other (See Comments)    Per [facility]    Current  Outpatient Prescriptions  Medication Sig Dispense Refill  . acetaminophen (TYLENOL 8 HOUR) 650 MG CR tablet Take 1 tablet (650 mg total) by mouth every 8 (eight) hours.      . Amino Acids-Protein Hydrolys (FEEDING SUPPLEMENT, PRO-STAT 64,) LIQD Take 30 mLs by mouth 4 (four) times daily -  with meals and at bedtime.      Marland Kitchen aspirin 81 MG tablet Take 1 tablet (81 mg total) by mouth daily.  30 tablet    . atorvastatin (LIPITOR) 40 MG tablet Take 40 mg by mouth every evening.      . barrier cream (NON-SPECIFIED) CREA Apply 1 application topically 3 (three) times daily. To bottom after diaper change      . calcium citrate (CALCITRATE - DOSED IN MG ELEMENTAL CALCIUM) 950 MG tablet Take 1 tablet by mouth daily.      Marland Kitchen donepezil (ARICEPT) 10 MG tablet Take 10 mg by mouth every evening.      . fentaNYL (DURAGESIC - DOSED MCG/HR) 25 MCG/HR patch Place 25 mcg onto the skin every 3 (three) days.      . Folic Acid-Vit B6-Vit B12 (FOLBEE) 2.5-25-1 MG TABS Take 1 tablet by mouth daily.      . furosemide (LASIX) 20 MG tablet Take 1 tablet (20 mg total) by mouth daily.  30 tablet  0  . lisinopril (ZESTRIL) 2.5 MG tablet Take 1 tablet (2.5 mg total) by mouth daily.      . metoprolol succinate (TOPROL-XL) 12.5 mg TB24 24 hr tablet Take 0.5 tablets (12.5 mg total) by mouth daily.  30 tablet  0  . Multiple Vitamin (MULTIVITAMIN WITH MINERALS) TABS tablet Take 1 tablet by mouth daily.      . polyethylene glycol powder (GLYCOLAX/MIRALAX) powder Take 17 g by mouth daily as needed. FOR CONSTIPATION      . promethazine (PHENERGAN) 25 MG tablet Take 25 mg by mouth every 6 (six) hours as needed for nausea.      . ranitidine (ZANTAC) 150 MG tablet Take 150 mg by mouth every evening.      . Rivaroxaban (XARELTO) 20 MG TABS tablet Take 1 tablet (20 mg total) by mouth daily with supper.  30 tablet    . traMADol (ULTRAM) 50 MG tablet Take 1 tablet (50 mg total) by mouth every 6 (six) hours as needed.  60 tablet  2  . Travoprost,  BAK Free, (TRAVATAN) 0.004 % SOLN ophthalmic solution Place 1 drop into both eyes at bedtime.      Marland Kitchen venlafaxine XR (EFFEXOR-XR) 75 MG 24 hr capsule Take 75 mg by mouth daily.      . Vitamin D, Ergocalciferol, (DRISDOL) 50000 UNITS CAPS Take 50,000  Units by mouth every 30 (thirty) days. Give q 15th day of each month       No current facility-administered medications for this visit.   Review Of Systems: Unable to perform review of systems due to severe dementia.   Physical Exam:            General: African American female laying in hospital bed, she is awake however is unable to answer questions, she does not appear to be any acute distress HEENT: Extraocular movements are intact, no scleral icterus, moist mucous membranes, neck was supple, no anterior posterior cervical lymphadenopathy was appreciated, trachea midline Heart: Regular rate and rhythm, 2/6 systolic murmur, no heaves or thrills Lungs: Clear to auscultation anteriorly, normal recuperating Abdomen: Obese, soft, bowel sounds present Extremities: Right lower extremity wrapped in Ace bandage from the level of the foot to just above the right knee, trace edema was noted in the left lower extremity, dorsalis pedis pulses 2+ in the left lower extremity, bilateral radial pulses were 2+ Skin: No rash, unable to evaluate for ecchymosis over the right lower extremity secondary to wrapping Neurology: Awake, does not answer questions, cranial nerves II through XII appear to be intact, unable to evaluate neurologic status a further secondary to severe dementia  Labs and Imaging: Lab Results  Component Value Date/Time   NA 142 01/24/2013 10:42 AM   NA 147 05/24/2012   K 4.4 01/24/2013 10:42 AM   K 4.0 01/25/2012 10:03 AM   CL 114* 01/24/2013 10:42 AM   CL 111 01/25/2012 10:03 AM   CO2 20 01/24/2013 10:42 AM   CO2 25 01/25/2012 10:03 AM   BUN 29* 01/24/2013 10:42 AM   BUN 26* 05/24/2012   BUN 30 01/13/2012  4:27 PM   CREATININE 0.92  01/24/2013 10:42 AM   CREATININE 0.7 05/24/2012   CREATININE 0.88 01/25/2012 10:03 AM   GLUCOSE 142* 01/24/2013 10:42 AM   Lab Results  Component Value Date   WBC 9.0 01/25/2013   HGB 7.5* 01/25/2013   HCT 23.8* 01/25/2013   MCV 87.5 01/25/2013   PLT 322 01/25/2013    Assessment and Plan: [name] is a 77 y.o. year old female admitted to [facility] status post right distal femur fracture. Please refer to problem specific assessment and plan.

## 2013-01-27 NOTE — Assessment & Plan Note (Signed)
Patient currently asymptomatic. Will continue [employer]

## 2013-01-27 NOTE — Assessment & Plan Note (Signed)
Controlled. Continue current pharmacotherapy.

## 2013-01-30 ENCOUNTER — Other Ambulatory Visit: Payer: Self-pay | Admitting: Family Medicine

## 2013-01-30 NOTE — Telephone Encounter (Signed)
Addressing rx for percocet ordered by resident on 01/26/13.  Called [facility] RN to see if fentanyl patches have arrived.  Patient still waiting on 50 mcg fentanyl patches. But is using two 25 mcg patches until they arrive. Will not fill rx [facility] from [facility]. Will d/c percocet at [facility] and check fentanyl dose, 25 mcg every three days on epic MAR.

## 2013-02-02 ENCOUNTER — Encounter: Payer: Self-pay | Admitting: Emergency Medicine

## 2013-02-06 ENCOUNTER — Encounter: Payer: Self-pay | Admitting: Pharmacist

## 2013-02-12 ENCOUNTER — Encounter (HOSPITAL_COMMUNITY): Payer: Self-pay | Admitting: Emergency Medicine

## 2013-02-12 ENCOUNTER — Inpatient Hospital Stay (HOSPITAL_COMMUNITY)
Admission: EM | Admit: 2013-02-12 | Discharge: 2013-02-22 | DRG: 871 | Disposition: A | Discharge status: Skilled Nursing Facility | Payer: PRIVATE HEALTH INSURANCE | Attending: Family Medicine | Admitting: Family Medicine

## 2013-02-12 ENCOUNTER — Emergency Department (HOSPITAL_COMMUNITY): Payer: PRIVATE HEALTH INSURANCE

## 2013-02-12 ENCOUNTER — Telehealth: Payer: Self-pay | Admitting: Family Medicine

## 2013-02-12 DIAGNOSIS — E872 Acidosis, unspecified: Secondary | ICD-10-CM | POA: Diagnosis present

## 2013-02-12 DIAGNOSIS — R7881 Bacteremia: Secondary | ICD-10-CM | POA: Diagnosis present

## 2013-02-12 DIAGNOSIS — F0280 Dementia in other diseases classified elsewhere without behavioral disturbance: Secondary | ICD-10-CM | POA: Diagnosis present

## 2013-02-12 DIAGNOSIS — Z7982 Long term (current) use of aspirin: Secondary | ICD-10-CM

## 2013-02-12 DIAGNOSIS — Z515 Encounter for palliative care: Secondary | ICD-10-CM

## 2013-02-12 DIAGNOSIS — E875 Hyperkalemia: Secondary | ICD-10-CM | POA: Diagnosis present

## 2013-02-12 DIAGNOSIS — L89109 Pressure ulcer of unspecified part of back, unspecified stage: Secondary | ICD-10-CM | POA: Diagnosis present

## 2013-02-12 DIAGNOSIS — B957 Other staphylococcus as the cause of diseases classified elsewhere: Secondary | ICD-10-CM

## 2013-02-12 DIAGNOSIS — R4701 Aphasia: Secondary | ICD-10-CM

## 2013-02-12 DIAGNOSIS — I959 Hypotension, unspecified: Secondary | ICD-10-CM

## 2013-02-12 DIAGNOSIS — N189 Chronic kidney disease, unspecified: Secondary | ICD-10-CM

## 2013-02-12 DIAGNOSIS — I69920 Aphasia following unspecified cerebrovascular disease: Secondary | ICD-10-CM

## 2013-02-12 DIAGNOSIS — I350 Nonrheumatic aortic (valve) stenosis: Secondary | ICD-10-CM

## 2013-02-12 DIAGNOSIS — E2749 Other adrenocortical insufficiency: Secondary | ICD-10-CM | POA: Diagnosis present

## 2013-02-12 DIAGNOSIS — N179 Acute kidney failure, unspecified: Secondary | ICD-10-CM | POA: Diagnosis present

## 2013-02-12 DIAGNOSIS — N390 Urinary tract infection, site not specified: Secondary | ICD-10-CM | POA: Diagnosis present

## 2013-02-12 DIAGNOSIS — G9340 Encephalopathy, unspecified: Secondary | ICD-10-CM | POA: Diagnosis present

## 2013-02-12 DIAGNOSIS — Z66 Do not resuscitate: Secondary | ICD-10-CM | POA: Diagnosis present

## 2013-02-12 DIAGNOSIS — I050 Rheumatic mitral stenosis: Secondary | ICD-10-CM

## 2013-02-12 DIAGNOSIS — A0472 Enterocolitis due to Clostridium difficile, not specified as recurrent: Secondary | ICD-10-CM | POA: Diagnosis present

## 2013-02-12 DIAGNOSIS — I129 Hypertensive chronic kidney disease with stage 1 through stage 4 chronic kidney disease, or unspecified chronic kidney disease: Secondary | ICD-10-CM | POA: Diagnosis present

## 2013-02-12 DIAGNOSIS — Z880 Allergy status to penicillin: Secondary | ICD-10-CM

## 2013-02-12 DIAGNOSIS — J309 Allergic rhinitis, unspecified: Secondary | ICD-10-CM

## 2013-02-12 DIAGNOSIS — Z79899 Other long term (current) drug therapy: Secondary | ICD-10-CM

## 2013-02-12 DIAGNOSIS — L89159 Pressure ulcer of sacral region, unspecified stage: Secondary | ICD-10-CM

## 2013-02-12 DIAGNOSIS — E785 Hyperlipidemia, unspecified: Secondary | ICD-10-CM

## 2013-02-12 DIAGNOSIS — Z86718 Personal history of other venous thrombosis and embolism: Secondary | ICD-10-CM

## 2013-02-12 DIAGNOSIS — Z6841 Body Mass Index (BMI) 40.0 and over, adult: Secondary | ICD-10-CM

## 2013-02-12 DIAGNOSIS — H409 Unspecified glaucoma: Secondary | ICD-10-CM

## 2013-02-12 DIAGNOSIS — A412 Sepsis due to unspecified staphylococcus: Principal | ICD-10-CM | POA: Diagnosis present

## 2013-02-12 DIAGNOSIS — F329 Major depressive disorder, single episode, unspecified: Secondary | ICD-10-CM | POA: Diagnosis present

## 2013-02-12 DIAGNOSIS — D638 Anemia in other chronic diseases classified elsewhere: Secondary | ICD-10-CM | POA: Diagnosis present

## 2013-02-12 DIAGNOSIS — E876 Hypokalemia: Secondary | ICD-10-CM | POA: Diagnosis not present

## 2013-02-12 DIAGNOSIS — I69959 Hemiplegia and hemiparesis following unspecified cerebrovascular disease affecting unspecified side: Secondary | ICD-10-CM

## 2013-02-12 DIAGNOSIS — F0390 Unspecified dementia without behavioral disturbance: Secondary | ICD-10-CM

## 2013-02-12 DIAGNOSIS — I10 Essential (primary) hypertension: Secondary | ICD-10-CM

## 2013-02-12 DIAGNOSIS — N182 Chronic kidney disease, stage 2 (mild): Secondary | ICD-10-CM | POA: Diagnosis present

## 2013-02-12 DIAGNOSIS — D649 Anemia, unspecified: Secondary | ICD-10-CM

## 2013-02-12 DIAGNOSIS — G309 Alzheimer's disease, unspecified: Secondary | ICD-10-CM | POA: Diagnosis present

## 2013-02-12 DIAGNOSIS — I639 Cerebral infarction, unspecified: Secondary | ICD-10-CM

## 2013-02-12 DIAGNOSIS — Z96659 Presence of unspecified artificial knee joint: Secondary | ICD-10-CM

## 2013-02-12 DIAGNOSIS — L8993 Pressure ulcer of unspecified site, stage 3: Secondary | ICD-10-CM | POA: Diagnosis present

## 2013-02-12 DIAGNOSIS — E8809 Other disorders of plasma-protein metabolism, not elsewhere classified: Secondary | ICD-10-CM | POA: Diagnosis present

## 2013-02-12 DIAGNOSIS — J45909 Unspecified asthma, uncomplicated: Secondary | ICD-10-CM | POA: Diagnosis present

## 2013-02-12 DIAGNOSIS — E119 Type 2 diabetes mellitus without complications: Secondary | ICD-10-CM | POA: Diagnosis present

## 2013-02-12 DIAGNOSIS — E669 Obesity, unspecified: Secondary | ICD-10-CM | POA: Diagnosis present

## 2013-02-12 DIAGNOSIS — I080 Rheumatic disorders of both mitral and aortic valves: Secondary | ICD-10-CM | POA: Diagnosis present

## 2013-02-12 DIAGNOSIS — A419 Sepsis, unspecified organism: Secondary | ICD-10-CM | POA: Diagnosis present

## 2013-02-12 DIAGNOSIS — Z853 Personal history of malignant neoplasm of breast: Secondary | ICD-10-CM

## 2013-02-12 DIAGNOSIS — E46 Unspecified protein-calorie malnutrition: Secondary | ICD-10-CM | POA: Diagnosis present

## 2013-02-12 DIAGNOSIS — K7200 Acute and subacute hepatic failure without coma: Secondary | ICD-10-CM | POA: Diagnosis present

## 2013-02-12 DIAGNOSIS — E870 Hyperosmolality and hypernatremia: Secondary | ICD-10-CM | POA: Diagnosis not present

## 2013-02-12 DIAGNOSIS — F0150 Vascular dementia without behavioral disturbance: Secondary | ICD-10-CM

## 2013-02-12 DIAGNOSIS — F3289 Other specified depressive episodes: Secondary | ICD-10-CM | POA: Diagnosis present

## 2013-02-12 HISTORY — DX: Other staphylococcus as the cause of diseases classified elsewhere: B95.7

## 2013-02-12 HISTORY — DX: Bacteremia: R78.81

## 2013-02-12 LAB — PHOSPHORUS: Phosphorus: 5.6 mg/dL — ABNORMAL HIGH (ref 2.3–4.6)

## 2013-02-12 LAB — CBC
HCT: 21.3 % — ABNORMAL LOW (ref 36.0–46.0)
HCT: 24.3 % — ABNORMAL LOW (ref 36.0–46.0)
Hemoglobin: 6.7 g/dL — CL (ref 12.0–15.0)
Hemoglobin: 7.8 g/dL — ABNORMAL LOW (ref 12.0–15.0)
MCH: 26.3 pg (ref 26.0–34.0)
MCH: 26.8 pg (ref 26.0–34.0)
MCHC: 31.5 g/dL (ref 30.0–36.0)
MCHC: 32.1 g/dL (ref 30.0–36.0)
MCV: 83.5 fL (ref 78.0–100.0)
MCV: 83.5 fL (ref 78.0–100.0)
Platelets: 323 10*3/uL (ref 150–400)
RDW: 17.5 % — ABNORMAL HIGH (ref 11.5–15.5)
RDW: 16.9 % — ABNORMAL HIGH (ref 11.5–15.5)

## 2013-02-12 LAB — CARBOXYHEMOGLOBIN
Carboxyhemoglobin: 1.8 % — ABNORMAL HIGH (ref 0.5–1.5)
Methemoglobin: 1.1 % (ref 0.0–1.5)
O2 Saturation: 47.1 %
Total hemoglobin: 6.4 g/dL — CL (ref 12.0–16.0)

## 2013-02-12 LAB — BASIC METABOLIC PANEL
CO2: 16 mEq/L — ABNORMAL LOW (ref 19–32)
Chloride: 106 mEq/L (ref 96–112)
Creatinine, Ser: 3.45 mg/dL — ABNORMAL HIGH (ref 0.50–1.10)
GFR calc non Af Amer: 12 mL/min — ABNORMAL LOW (ref >90)
Potassium: 5.8 mEq/L — ABNORMAL HIGH (ref 3.5–5.1)
Sodium: 138 mEq/L (ref 135–145)

## 2013-02-12 LAB — COMPREHENSIVE METABOLIC PANEL
AST: 114 U/L — ABNORMAL HIGH (ref 0–37)
BUN: 72 mg/dL — ABNORMAL HIGH (ref 6–23)
CO2: 14 mEq/L — ABNORMAL LOW (ref 19–32)
Calcium: 8.1 mg/dL — ABNORMAL LOW (ref 8.4–10.5)
Chloride: 105 mEq/L (ref 96–112)
Creatinine, Ser: 3.55 mg/dL — ABNORMAL HIGH (ref 0.50–1.10)
GFR calc Af Amer: 13 mL/min — ABNORMAL LOW (ref >90)
GFR calc non Af Amer: 12 mL/min — ABNORMAL LOW (ref >90)
Glucose, Bld: 137 mg/dL — ABNORMAL HIGH (ref 70–99)
Total Bilirubin: 0.4 mg/dL (ref 0.3–1.2)
Total Protein: 6.3 g/dL (ref 6.0–8.3)

## 2013-02-12 LAB — URINALYSIS, ROUTINE W REFLEX MICROSCOPIC
Glucose, UA: 100 mg/dL — AB
Ketones, ur: 40 mg/dL — AB
Nitrite: POSITIVE — AB
Protein, ur: >300 mg/dL — AB
Specific Gravity, Urine: 1.025 (ref 1.005–1.030)

## 2013-02-12 LAB — CG4 I-STAT (LACTIC ACID): Lactic Acid, Venous: 4.54 mmol/L — ABNORMAL HIGH (ref 0.5–2.2)

## 2013-02-12 LAB — MAGNESIUM: Magnesium: 2.7 mg/dL — ABNORMAL HIGH (ref 1.5–2.5)

## 2013-02-12 LAB — POCT I-STAT 3, ART BLOOD GAS (G3+)
Bicarbonate: 13.8 mEq/L — ABNORMAL LOW (ref 20.0–24.0)
O2 Saturation: 98 %
pCO2 arterial: 22.4 mmHg — ABNORMAL LOW (ref 35.0–45.0)
pH, Arterial: 7.382 (ref 7.350–7.450)
pO2, Arterial: 89 mmHg (ref 80.0–100.0)

## 2013-02-12 LAB — GLUCOSE, CAPILLARY
Glucose-Capillary: 137 mg/dL — ABNORMAL HIGH (ref 70–99)
Glucose-Capillary: 153 mg/dL — ABNORMAL HIGH (ref 70–99)

## 2013-02-12 LAB — URINE MICROSCOPIC-ADD ON

## 2013-02-12 LAB — LACTIC ACID, PLASMA: Lactic Acid, Venous: 5.1 mmol/L — ABNORMAL HIGH (ref 0.5–2.2)

## 2013-02-12 MED ORDER — SODIUM CHLORIDE 0.9 % IV SOLN
1000.0000 mL | INTRAVENOUS | Status: DC
  Administered 2013-02-12 – 2013-02-13 (×2): 1000 mL via INTRAVENOUS

## 2013-02-12 MED ORDER — LEVOFLOXACIN IN D5W 750 MG/150ML IV SOLN
750.0000 mg | INTRAVENOUS | Status: DC
  Filled 2013-02-12: qty 150

## 2013-02-12 MED ORDER — DEXTROSE 50 % IV SOLN
1.0000 | Freq: Once | INTRAVENOUS | Status: AC
  Administered 2013-02-12: 50 mL via INTRAVENOUS
  Filled 2013-02-12: qty 50

## 2013-02-12 MED ORDER — NOREPINEPHRINE BITARTRATE 1 MG/ML IJ SOLN
4.0000 ug/min | INTRAVENOUS | Status: DC
  Administered 2013-02-12: 4 ug/min via INTRAVENOUS
  Filled 2013-02-12 (×2): qty 4

## 2013-02-12 MED ORDER — NOREPINEPHRINE BITARTRATE 1 MG/ML IJ SOLN
2.0000 ug/min | INTRAMUSCULAR | Status: DC
  Administered 2013-02-13: 10 ug/min via INTRAVENOUS
  Administered 2013-02-13 (×2): 15 ug/min via INTRAVENOUS
  Filled 2013-02-12 (×3): qty 4

## 2013-02-12 MED ORDER — SODIUM CHLORIDE 0.9 % IV SOLN
1000.0000 mL | Freq: Once | INTRAVENOUS | Status: AC
  Administered 2013-02-12: 1000 mL via INTRAVENOUS

## 2013-02-12 MED ORDER — ASPIRIN 300 MG RE SUPP
300.0000 mg | RECTAL | Status: AC
  Administered 2013-02-12: 300 mg via RECTAL
  Filled 2013-02-12: qty 1

## 2013-02-12 MED ORDER — AZTREONAM 1 G IJ SOLR
1.0000 g | Freq: Three times a day (TID) | INTRAMUSCULAR | Status: DC
  Administered 2013-02-12 – 2013-02-14 (×5): 1 g via INTRAVENOUS
  Filled 2013-02-12 (×8): qty 1

## 2013-02-12 MED ORDER — HEPARIN SODIUM (PORCINE) 5000 UNIT/ML IJ SOLN
5000.0000 [IU] | Freq: Three times a day (TID) | INTRAMUSCULAR | Status: DC
  Administered 2013-02-12 – 2013-02-14 (×6): 5000 [IU] via SUBCUTANEOUS
  Filled 2013-02-12 (×9): qty 1

## 2013-02-12 MED ORDER — VASOPRESSIN 20 UNIT/ML IJ SOLN
0.0300 [IU]/min | INTRAVENOUS | Status: DC
  Filled 2013-02-12: qty 2.5

## 2013-02-12 MED ORDER — INSULIN ASPART 100 UNIT/ML ~~LOC~~ SOLN
2.0000 [IU] | SUBCUTANEOUS | Status: DC
  Administered 2013-02-12: 2 [IU] via SUBCUTANEOUS
  Administered 2013-02-12: 4 [IU] via SUBCUTANEOUS
  Administered 2013-02-13: 2 [IU] via SUBCUTANEOUS
  Administered 2013-02-14: 4 [IU] via SUBCUTANEOUS
  Administered 2013-02-14: 2 [IU] via SUBCUTANEOUS
  Administered 2013-02-15: 4 [IU] via SUBCUTANEOUS
  Administered 2013-02-15: 6 [IU] via SUBCUTANEOUS
  Administered 2013-02-15 (×2): 4 [IU] via SUBCUTANEOUS
  Administered 2013-02-15: 6 [IU] via SUBCUTANEOUS

## 2013-02-12 MED ORDER — CALCIUM GLUCONATE 10 % IV SOLN
1.0000 g | Freq: Once | INTRAVENOUS | Status: DC
  Filled 2013-02-12: qty 10

## 2013-02-12 MED ORDER — SODIUM POLYSTYRENE SULFONATE 15 GM/60ML PO SUSP
60.0000 g | Freq: Once | ORAL | Status: AC
  Administered 2013-02-12: 60 g via RECTAL
  Filled 2013-02-12: qty 240

## 2013-02-12 MED ORDER — PANTOPRAZOLE SODIUM 40 MG IV SOLR
40.0000 mg | INTRAVENOUS | Status: DC
  Administered 2013-02-12 – 2013-02-13 (×2): 40 mg via INTRAVENOUS
  Filled 2013-02-12 (×3): qty 40

## 2013-02-12 MED ORDER — SODIUM CHLORIDE 0.9 % IV SOLN
250.0000 mL | INTRAVENOUS | Status: DC | PRN
  Administered 2013-02-18 – 2013-02-19 (×2): 250 mL via INTRAVENOUS

## 2013-02-12 MED ORDER — DEXTROSE 5 % IV SOLN
1.0000 g | Freq: Once | INTRAVENOUS | Status: AC
  Administered 2013-02-12: 1 g via INTRAVENOUS
  Filled 2013-02-12: qty 1

## 2013-02-12 MED ORDER — LEVOFLOXACIN IN D5W 750 MG/150ML IV SOLN
750.0000 mg | Freq: Once | INTRAVENOUS | Status: AC
  Administered 2013-02-12: 750 mg via INTRAVENOUS
  Filled 2013-02-12: qty 150

## 2013-02-12 MED ORDER — SODIUM CHLORIDE 0.9 % IV BOLUS (SEPSIS)
1000.0000 mL | Freq: Once | INTRAVENOUS | Status: AC
  Administered 2013-02-12: 1000 mL via INTRAVENOUS

## 2013-02-12 MED ORDER — ASPIRIN 81 MG PO CHEW: 324.0000 mg | CHEWABLE_TABLET | ORAL | Status: AC

## 2013-02-12 MED ORDER — INSULIN ASPART 100 UNIT/ML IV SOLN
10.0000 [IU] | Freq: Once | INTRAVENOUS | Status: AC
  Administered 2013-02-12: 10 [IU] via INTRAVENOUS
  Filled 2013-02-12: qty 0.1

## 2013-02-12 NOTE — ED Provider Notes (Signed)
Seen by [facility] Dr Berline Chough She will go to ICU due to persistent hypotension Also noted to be hyperkalemic, this was treated Levophed ordered for hypotension Pt is critically ill  Joya Gaskins, MD 02/12/13 1421

## 2013-02-12 NOTE — Progress Notes (Signed)
ANTIBIOTIC CONSULT NOTE - INITIAL  Pharmacy Consult for Levaquin and Aztreonam Indication: rule out sepsis (suspected source urinary tract)  Allergies  Allergen Reactions  . Penicillins Other (See Comments)    Per MAR  . Tetanus Toxoids Other (See Comments)    Per MAR  . Vicodin [Hydrocodone-Acetaminophen] Other (See Comments)    Per MAR    Patient Measurements:    Ht: 66 in  Wt: ~96 kg  Vital Signs: BP: 78/63 mmHg (11/30 1130) Pulse Rate: 93 (11/30 1130) Intake/Output from previous day:   Intake/Output from this shift:    Labs: No results found for this basename: WBC, HGB, PLT, LABCREA, CREATININE,  in the last 72 hours The CrCl is unknown because both a height and weight (above a minimum accepted value) are required for this calculation. No results found for this basename: Rolm Gala, VANCORANDOM, GENTTROUGH, GENTPEAK, GENTRANDOM, TOBRATROUGH, TOBRAPEAK, TOBRARND, AMIKACINPEAK, AMIKACINTROU, AMIKACIN,  in the last 72 hours   Microbiology: Recent Results (from the past 720 hour(s))  URINE CULTURE     Status: None   Collection Time    01/14/13  5:26 PM      Result Value Range Status   Specimen Description URINE, CATHETERIZED   Final   Special Requests NONE   Final   Culture  Setup Time     Final   Value: 01/15/2013 18:05     Performed at [employer] Count     Final   Value: NO GROWTH     Performed at [facility]   Culture     Final   Value: NO GROWTH     Performed at [facility]   Report Status 01/16/2013 FINAL   Final    Medical History: Past Medical History  Diagnosis Date  . Aphasia   . Muscle weakness   . Malaise   . Fatigue   . Breast neoplasm   . Hypertension   . Glaucoma   . UTI (lower urinary tract infection)   . Vitamin D deficiency   . Hiatal hernia   . Constipation   . Diarrhea   . Asthma   . Obesity   . Hyperlipidemia   . Alzheimer's dementia   . Depression   . Allergic rhinitis   .  Dementia   . DVT (deep venous thrombosis) 2014    "put her on Xeralto" (01/20/2013)  . Pneumonia 2014  . Shortness of breath     "lately; just laying there" (01/20/2013)  . Type II diabetes mellitus   . Anemia   . History of blood transfusion     "got a unit yesterday" (01/20/2013)  . Stroke 2006; 2007    residual:  "mute, dementia, generalized weakness" (01/20/2013)  . Recurrent UTI     "at least 3 since 2008 when she went into nursing home" (01/20/2013)  . Breast cancer     "right" (01/20/2013)    Medications:  See electronic med rec  Assessment: 78 y.o. female presents from [facility] NH with hypotension, foul smelling urine, urinary sediment in foley bag. To begin broad spectrum antibiotics (Aztreonam and Levaquin) for r/o sepsis (suspected urinary source). Code sepsis called ~1100. Antibiotics ordered (Aztreonam 1gm and Levaquin 750mg ) and delivered ~1115. SCr 0.72, est CrCl 55-60 ml/min.  Pt with h/o recurrent UTIs.  Goal of Therapy:  Eradication of infection  Plan:  1. Levaquin 750mg  IV q24h. 2. Aztreonam 1gm IV q8h. 3. Will f/u micro data, renal function, pt's clinical condition.  Christoper Fabian, PharmD, BCPS Clinical pharmacist, pager [phone] 02/12/2013,12:19 PM

## 2013-02-12 NOTE — ED Notes (Signed)
Dr Bebe Shaggy made aware of blood pressure, and pt upgraded to level 1 sepsis.

## 2013-02-12 NOTE — ED Notes (Signed)
Abnormal labs given to Dr. Bebe Shaggy

## 2013-02-12 NOTE — ED Provider Notes (Signed)
CSN: [id number]     Arrival date & time 02/12/13  1038 History   First MD Initiated Contact with Patient 02/12/13 1042     Chief Complaint  Patient presents with  . Hypotension    Patient is a 78 y.o. female presenting with weakness. The history is provided by the EMS personnel. The history is limited by the condition of the patient.  Weakness This is a new problem. Episode onset: unknown time ago. The problem occurs constantly. The problem has been rapidly worsening. Nothing aggravates the symptoms. Nothing relieves the symptoms. She has tried rest for the symptoms. The treatment provided no relief.  pt presents from nursing facility Per EMS, pt has had increased lethargy and noted to have hypotension She also has indwelling foley and noted to have UTI No other details are known  Past Medical History  Diagnosis Date  . Aphasia   . Muscle weakness   . Malaise   . Fatigue   . Breast neoplasm   . Hypertension   . Glaucoma   . UTI (lower urinary tract infection)   . Vitamin D deficiency   . Hiatal hernia   . Constipation   . Diarrhea   . Asthma   . Obesity   . Hyperlipidemia   . Alzheimer's dementia   . Depression   . Allergic rhinitis   . Dementia   . DVT (deep venous thrombosis) 2014    "put her on Xeralto" (01/20/2013)  . Pneumonia 2014  . Shortness of breath     "lately; just laying there" (01/20/2013)  . Type II diabetes mellitus   . Anemia   . History of blood transfusion     "got a unit yesterday" (01/20/2013)  . Stroke 2006; 2007    residual:  "mute, dementia, generalized weakness" (01/20/2013)  . Recurrent UTI     "at least 3 since 2008 when she went into nursing home" (01/20/2013)  . Breast cancer     "right" (01/20/2013)   Past Surgical History  Procedure Laterality Date  . Joint replacement      left knee in '90s  . Breast lumpectomy Right 1990's; 2000's    "she's had 2" (01/20/2013)  . Replacement total knee bilateral Bilateral 1990's   History  reviewed. No pertinent family history. History  Substance Use Topics  . Smoking status: Never Smoker   . Smokeless tobacco: Never Used  . Alcohol Use: No   OB History   Grav Para Term Preterm Abortions TAB SAB Ect Mult Living                 Review of Systems  Unable to perform ROS: Mental status change  Neurological: Positive for weakness.    Allergies  Penicillins; Tetanus toxoids; and Vicodin  Home Medications   Current Outpatient Rx  Name  Route  Sig  Dispense  Refill  . acetaminophen (TYLENOL 8 HOUR) 650 MG CR tablet   Oral   Take 1 tablet (650 mg total) by mouth every 8 (eight) hours.         . Amino Acids-Protein Hydrolys (FEEDING SUPPLEMENT, PRO-STAT 64,) LIQD   Oral   Take 30 mLs by mouth 4 (four) times daily -  with meals and at bedtime.         Marland Kitchen aspirin 81 MG tablet   Oral   Take 1 tablet (81 mg total) by mouth daily.   30 tablet      . atorvastatin (LIPITOR) 20 MG tablet  Oral   Take 20 mg by mouth daily.         . barrier cream (NON-SPECIFIED) CREA   Topical   Apply 1 application topically 3 (three) times daily. To bottom after diaper change         . escitalopram (LEXAPRO) 10 MG tablet   Oral   Take 10 mg by mouth daily.         . fentaNYL (DURAGESIC - DOSED MCG/HR) 25 MCG/HR patch   Transdermal   Place 25 mcg onto the skin every 3 (three) days.         . Folic Acid-Vit B6-Vit B12 (FOLBEE) 2.5-25-1 MG TABS   Oral   Take 1 tablet by mouth daily.         . furosemide (LASIX) 20 MG tablet   Oral   Take 1 tablet (20 mg total) by mouth daily.   30 tablet   0   . lisinopril (ZESTRIL) 2.5 MG tablet   Oral   Take 1 tablet (2.5 mg total) by mouth daily.         . metoprolol succinate (TOPROL-XL) 12.5 mg TB24 24 hr tablet   Oral   Take 0.5 tablets (12.5 mg total) by mouth daily.   30 tablet   0   . Multiple Vitamin (MULTIVITAMIN WITH MINERALS) TABS tablet   Oral   Take 1 tablet by mouth daily.         . polyethylene  glycol powder (GLYCOLAX/MIRALAX) powder   Oral   Take 17 g by mouth daily as needed. FOR CONSTIPATION         . promethazine (PHENERGAN) 25 MG tablet   Oral   Take 25 mg by mouth every 6 (six) hours as needed for nausea.         . ranitidine (ZANTAC) 150 MG tablet   Oral   Take 150 mg by mouth every evening.         . traMADol (ULTRAM) 50 MG tablet   Oral   Take 1 tablet (50 mg total) by mouth every 6 (six) hours as needed.   60 tablet   2   . Travoprost, BAK Free, (TRAVATAN) 0.004 % SOLN ophthalmic solution   Both Eyes   Place 1 drop into both eyes at bedtime.         . Vitamin D, Ergocalciferol, (DRISDOL) 50000 UNITS CAPS   Oral   Take 50,000 Units by mouth every 30 (thirty) days. Give q 15th day of each month          BP 83/48  Resp 22  SpO2 100% Physical Exam CONSTITUTIONAL: elderly, chronically ill HEAD: Normocephalic/atraumatic EYES: EOMI ENMT: Mucous membranes dry NECK: supple no meningeal signs CV:  Systolic murmur noted LUNGS: Lungs are clear to auscultation bilaterally, no apparent distress ABDOMEN: soft, nontender, no rebound or guarding ZO:XWRUE catheter in place on arrival NEURO: Pt is awake/alert, she is aphasic at baseline but she will respond to voice and pain EXTREMITIES: right LE is bandaged with ACE wrap (recent femur fx)  Left UE is contracted likely at baseline SKIN: warm, color normal, bandage to sacrum PSYCH: unable to assess  ED Course  CENTRAL LINE Date/Time: 02/12/2013 12:32 PM Performed by: Joya Gaskins Authorized by: Joya Gaskins Consent: The procedure was performed in an emergent situation. Time out: Immediately prior to procedure a "time out" was called to verify the correct patient, procedure, equipment, support staff and site/side marked as required. Indications: vascular  access Anesthesia: local infiltration Local anesthetic: lidocaine 1% without epinephrine Anesthetic total: 3 ml Patient sedated:  no Preparation: skin prepped with ChloraPrep Skin prep agent dried: skin prep agent completely dried prior to procedure Sterile barriers: all five maximum sterile barriers used - cap, mask, sterile gown, sterile gloves, and large sterile sheet Hand hygiene: hand hygiene performed prior to central venous catheter insertion Location details: right internal jugular Site selection rationale: pt on anticoagulants, difficult IV access Patient position: Trendelenburg Catheter type: triple lumen Catheter size: 7 Fr Ultrasound guidance: yes (image archived) Number of attempts: 2 Successful placement: yes Post-procedure: line sutured and dressing applied Assessment: blood return through all ports, placement verified by x-ray and free fluid flow Patient tolerance: Patient tolerated the procedure well with no immediate complications. Comments: Pt unable to give consent I spoke to her sister over the phone and made her aware of need for emergent central line She was unable to arrive in time and therefore emergent procedure performed    PROCEDURE NOTE ULTRASOUND GUIDED CENTRAL LINE - RIGHT IJ  IMAGES ARCHIVED PT TOLERATED WELL   CRITICAL CARE Performed by: Joya Gaskins Total critical care time: 45 Critical care time was exclusive of separately billable procedures and treating other patients. Critical care was necessary to treat or prevent imminent or life-threatening deterioration. Critical care was time spent personally by me on the following activities: development of treatment plan with patient and/or surrogate as well as nursing, discussions with consultants, evaluation of patient's response to treatment, examination of patient, obtaining history from patient or surrogate, ordering and performing treatments and interventions, ordering and review of laboratory studies, ordering and review of radiographic studies, pulse oximetry and re-evaluation of patient's condition.  Labs Review Labs  Reviewed  CULTURE, BLOOD (ROUTINE X 2)  CULTURE, BLOOD (ROUTINE X 2)  URINE CULTURE  CBC WITH DIFFERENTIAL  COMPREHENSIVE METABOLIC PANEL  URINALYSIS, ROUTINE W REFLEX MICROSCOPIC   Imaging Review No results found.  EKG Interpretation    Date/Time:  Sunday February 12 2013 10:42:12 EST Ventricular Rate:  96 PR Interval:  119 QRS Duration: 91 QT Interval:  451 QTC Calculation: 570 R Axis:   -22 Text Interpretation:  Sinus rhythm Borderline short PR interval Borderline left axis deviation Repol abnrm suggests ischemia, diffuse leads Prolonged QT interval Baseline wander in lead(s) V3 Confirmed by Jalen Daluz  MD, Anvay Tennis ([id number]) on 02/12/2013 11:04:42 AM            12 :38 PM Pt seen on arrival Noted to be hypotensive No IV access.  Initial attempted at right humerus IO unsuccessul Central line placed Pt tolerated well IV fluids ordered Suspect urosepsis with recent UTI and has indwelling foley and hypotensive Discussed with dr Molli Knock.  Can go to medicine if lactate less than 4 BP is now improving Updated sister in the room (she is emergency contact) 12:49 PM D/w dr Molli Knock, he asked me to call family practice 12:58 PM D/w family practice, will see patient Her SBP is improving   MDM  No diagnosis found. Nursing notes including past medical history and social history reviewed and considered in documentation xrays reviewed and considered Labs/vital reviewed and considered Previous records reviewed and considered - recent admission for femur fx     Joya Gaskins, MD 02/12/13 1258

## 2013-02-12 NOTE — ED Notes (Signed)
Pt here by ems for [facility], pt arrives with complaint of hypotension with ems bp was 63 systolic, urine is Kenzel Ruesch and purple in color in urine bag from foley. Pt with foul odor noted. Other vitals stable.

## 2013-02-12 NOTE — ED Notes (Signed)
Patient was source for blood exposure to staff member.  Per hospital policy blood was drawn for exposure panel. 

## 2013-02-12 NOTE — ED Notes (Signed)
Central line placed by dr Bebe Shaggy to right ij. Xray at bedside now for placement.

## 2013-02-12 NOTE — Telephone Encounter (Signed)
Received page @ 545 am from [facility].  Nurse reporting foul smelling urine and lots of "sediment" in the foley bag.  Nurse requesting UA. Gave Verbal order for this. Patient's vital signs were stable and patient was afebrile.

## 2013-02-12 NOTE — H&P (Addendum)
Name: [name] MRN: [id number] DOB: [birth year]    LOS: 0  Shared Family Medicine Teaching Services &  PCCM ADMISSION NOTE  History of Present Illness: Patient is a 78 year old female who [facility] due to hypotension and decreased responsiveness.  Her sister is accompanying her and is her medical power of attorney.  The patient is aphasic and has had recent hospitalization for a right femur fracture and has been at the nursing home since.  This morning she was found to be hypotensive, her urine has been malodorous and she was profoundly weak.  Once again she is aphasic at baseline but her sister reports that she will acknowledge questions with yes no answers; she is no longer doing that today.  She was found to have urinary tract infection with marked electrolyte abnormalities including hyperkalemia, elevated lactate and is hypotensive despite of fluid resuscitation.  In the emergency department, central line was placed and she was started on levophed + ABX.  Family Medicine initially consulted and initiated admission.  CCM to assume care.  Lines / Drains: 02/12/13 - RIJ  Cultures: 02/12/13 - blood cultures x2 >>> 02/12/13 - Urine Culture >>>  Antibiotics: 02/12/13: Azactam >>> 02/12/13: Levaquin >>>  Tests / Events: 02/12/13: UA + Nitrites/Leukocytes, TNTC WBC,RBC, Many bacteria 02/12/13: CXR: RIJ in place, no effusion/infiltrate  The patient is aphasic, remainder was able to be  obtained from the patient's sister and from chart review.    Past Medical History  Diagnosis Date  . Aphasia   . Muscle weakness   . Malaise   . Fatigue   . Breast neoplasm   . Hypertension   . Glaucoma   . UTI (lower urinary tract infection)   . Vitamin D deficiency   . Hiatal hernia   . Constipation   . Diarrhea   . Asthma   . Obesity   . Hyperlipidemia   . Alzheimer's dementia   . Depression   . Allergic rhinitis   . Dementia   . DVT (deep venous thrombosis)  2014    "put her on Xeralto" (01/20/2013)  . Pneumonia 2014  . Shortness of breath     "lately; just laying there" (01/20/2013)  . Type II diabetes mellitus   . Anemia   . History of blood transfusion     "got a unit yesterday" (01/20/2013)  . Stroke 2006; 2007    residual:  "mute, dementia, generalized weakness" (01/20/2013)  . Recurrent UTI     "at least 3 since 2008 when she went into nursing home" (01/20/2013)  . Breast cancer     "right" (01/20/2013)   Past Surgical History  Procedure Laterality Date  . Joint replacement      left knee in '90s  . Breast lumpectomy Right 1990's; 2000's    "she's had 2" (01/20/2013)  . Replacement total knee bilateral Bilateral 1990's   Prior to Admission medications   Medication Sig Start Date End Date Taking? Authorizing Provider  Amino Acids-Protein Hydrolys (FEEDING SUPPLEMENT, PRO-STAT SUGAR FREE 64,) LIQD Take 30 mLs by mouth 2 (two) times daily.   Yes Historical Provider, MD  calcium citrate (CALCITRATE - DOSED IN MG ELEMENTAL CALCIUM) 950 MG tablet Take 200 mg of elemental calcium by mouth daily.   Yes Historical Provider, MD  cloNIDine (CATAPRES - DOSED IN MG/24 HR) 0.1 mg/24hr patch Place 0.1 mg onto the skin once a week.   Yes Historical Provider, MD  collagenase (SANTYL) ointment Apply 1 application topically  daily.   Yes Historical Provider, MD  dipyridamole-aspirin (AGGRENOX) 200-25 MG per 12 hr capsule Take 1 capsule by mouth 2 (two) times daily.   Yes Historical Provider, MD  donepezil (ARICEPT) 10 MG tablet Take 10 mg by mouth at bedtime.   Yes Historical Provider, MD  fluticasone (FLONASE) 50 MCG/ACT nasal spray Place 1 spray into both nostrils daily.   Yes Historical Provider, MD  Rivaroxaban (XARELTO) 20 MG TABS tablet Take 20 mg by mouth daily.   Yes Historical Provider, MD   Allergies Allergies  Allergen Reactions  . Penicillins Other (See Comments)    Per MAR  . Tetanus Toxoids Other (See Comments)    Per MAR  . Vicodin  [Hydrocodone-Acetaminophen] Other (See Comments)    Per MAR   Family History History reviewed. No pertinent family history.  Social History  reports that she has never smoked. She has never used smokeless tobacco. She reports that she does not drink alcohol or use illicit drugs.  Review Of Systems  11 points review of systems is negative with an exception of listed in HPI.  Vital Signs: Filed Vitals:   02/12/13 1415  BP: 76/45  Pulse: 96  Temp:   Resp: 21    Intake/Output Summary (Last 24 hours) at 02/12/13 1451 Last data filed at 02/12/13 1409  Gross per 24 hour  Intake   2000 ml  Output      0 ml  Net   2000 ml    Ventilator settings:    Physical Examination:  GENERAL: Adult African American  female. On ED gurney, RIJ in place.    NEURO: Minimally responsive.  Withdrawls to pain.  Eyes open  PSYCH: Non communicative.  HNEENT: R IJ  CARDIO: 3/6 SEM  LUNGS: Coarse breathsounds  ABDOMEN: distended  EXTREM:  R LE in long leg brace with ACE wrapping  GU: deferred  SKIN: Per nursing    Labs and Imaging:   Labs: CBC    Component Value Date/Time   WBC 33.7* 02/12/2013 1046   WBC 5.2 05/24/2012   WBC 4.4 06/25/2006 1009   RBC 2.61* 02/12/2013 1046   RBC 4.28 06/25/2006 1009   HGB 6.8* 02/12/2013 1046   HGB 11.9 06/25/2006 1009   HCT 21.9* 02/12/2013 1046   HCT 35.9 06/25/2006 1009   PLT 336 02/12/2013 1046   PLT 206 06/25/2006 1009   MCV 83.9 02/12/2013 1046   MCV 83.8 06/25/2006 1009   MCH 26.1 02/12/2013 1046   MCH 27.8 06/25/2006 1009   MCHC 31.1 02/12/2013 1046   MCHC 33.1 06/25/2006 1009   RDW 17.7* 02/12/2013 1046   RDW 14.7* 06/25/2006 1009   LYMPHSABS 1.7 02/12/2013 1046   LYMPHSABS 1.3 06/25/2006 1009   MONOABS 2.0* 02/12/2013 1046   MONOABS 0.4 06/25/2006 1009   EOSABS 0.0 02/12/2013 1046   EOSABS 0.1 06/25/2006 1009   BASOSABS 0.0 02/12/2013 1046   BASOSABS 0.0 06/25/2006 1009    BMET    Component Value Date/Time   NA 137 02/12/2013 1046   NA  147 05/24/2012   K 6.6* 02/12/2013 1046   K 4.0 01/25/2012 1003   CL 105 02/12/2013 1046   CL 111 01/25/2012 1003   CO2 14* 02/12/2013 1046   CO2 25 01/25/2012 1003   GLUCOSE 137* 02/12/2013 1046   BUN 72* 02/12/2013 1046   BUN 26* 05/24/2012   BUN 30 01/13/2012 1627   CREATININE 3.55* 02/12/2013 1046   CREATININE 0.7 05/24/2012   CREATININE 0.88  01/25/2012 1003   CALCIUM 8.1* 02/12/2013 1046   CALCIUM 8.4 01/25/2012 1003   GFRNONAA 12* 02/12/2013 1046   GFRAA 13* 02/12/2013 1046    ABG    Component Value Date/Time   TCO2 20 01/14/2013 1453    No results found for this basename: MG,  in the last 168 hours Lab Results  Component Value Date   CALCIUM 8.1* 02/12/2013    Active Problems:   Sepsis  ASSESSMENT AND PLAN  PULMONARY A: Unclear respiratory status.  Concerns for worsening.  After discussing with the patient's sister they do wish to for Tykesha to be a DNI - DO NOT INTUBATE P:   - Defer ABGs will not change management, consider if continues to worsen. - Supplemental O2 as needed. - Gentle fluid as to avoid respiratory failure.  CARDIOVASCULAR  Recent Labs Lab 02/12/13 1241 02/12/13 1355  LATICACIDVEN 4.54* 5.1*   ECG:  02/12/13: No STEMI, Prolonged QT, ST depression Lines: R IJ  A: Sepsis, hypotension, history of mitral and aortic stenosis, hx of HTN P:  - Patient currently on Levophed. - CVP goal greater than 10, MAP goal >65. - Status post 2 L, continued to resuscitate her with additional 2 L. - Hold home meds.  RENAL  Recent Labs Lab 02/12/13 1046  NA 137  K 6.6*  CL 105  CO2 14*  BUN 72*  CREATININE 3.55*  CALCIUM 8.1*   A:  Hyperkalemia, metabolic acidosis, acute on chronic renal failure, hypocalcemia P:   - Status post calcium gluconate  - Status post insulin and glucose for potassium, continue IV fluids - Volume resuscitate - BMET at 8 PM. - Kayexalate enema. - Would not tolerate HD at this time due to  hypotension.  GASTROINTESTINAL  Recent Labs Lab 02/12/13 1046  AST 114*  ALT 74*  ALKPHOS 189*  BILITOT 0.4  PROT 6.3  ALBUMIN 1.7*   A:  Shock liver, hypoalbuminemia with protein calorie malnutrition P:   - Early tube feeds recommended but will wait until hemodynamically more stable.  HEMATOLOGIC  Recent Labs Lab 02/12/13 1046  HGB 6.8*  HCT 21.9*  PLT 336   A:  Anemia, history of DVT on anticoagulation & recent Femur Fracture, P:  - PRBC x1. - Heparin subcutaneous, monitor and consider SCDs only.  INFECTIOUS  Recent Labs Lab 02/12/13 1046  WBC 33.7*   A:  Severe sepsis, urinary tract infection and septic shock. P:   - Antibiotics per pharmacy for Levaquin and Aztreonam - F/U on culture. - Will tailor abx coverage accordingly. - Did not do full sepsis protocol to avoid fluid overload as patient is LCB with pressors only.  ENDOCRINE No results found for this basename: GLUCAP,  in the last 168 hours A:  HLD, Obesity, Protein Calorie Malnutrition   P:   - Hold home medications, - Monitor CBGs - ISS as indicated.  NEUROLOGIC A:  History of multiple CVAs with left hemiplegia and aphasia. P:   - Continue to monitor, see below  BEST PRACTICE / DISPOSITION Level of Care:  ICU Primary Service:  Critical care Consultants:  Family medicine Code Status:  DO NOT INTUBATE, no chest compressions, any medication as appropriate Diet:  NPO, consider early tube feeds DVT Px:  Heparin SQ GI Px:  PPI Social / Family:  Dr. Berline Chough discussed with family medicine discussed with the pt's sister Colvin Caroli) regarding CODE STATUS and wishes.  Family would like for all medical treatment to be provided if indicated  including antiarrhythmics and pressors.  And do not feel as though intubation or chest compressions are within the wishes of Ms. Brousseau.    I discussed his case with Dr. Molli Knock who will be admitting the pt to the Critical Care Service.   Andrena Mews, DO [name] Resident - PGY-3 02/12/2013 3:14 PM  I discussed case with Dr. Berline Chough, note above reviewed and edited accordingly.  Will admit to the ICU for pressor support of her hemodynamics for a short period of time.  If deteriorates or makes no progress then will need to discuss further with the family regarding what is appropriate medical therapy.  If renal function deteriorates will need to call renal to see if patient is an appropriate HD candidate which in my judgement would not given poor functional status.    Also of note, there is a large solid mass on the patient right breast that is not fluctuant and appears solid in nature.  If patient shows signs of improvement will U/S in AM to see if there is any fluid within it that may need drainage (which is highly doubtful by physical exam findings) and doubt is the source of infection.  CC time 45 min.  Patient seen and examined, agree with above note.  I dictated the care and orders written for this patient under my direction.  Alyson Reedy, MD [phone]

## 2013-02-12 NOTE — Progress Notes (Signed)
PULMONARY  / CRITICAL CARE MEDICINE  Name: [name] MRN: [id number] DOB: 07-25-1934    ADMISSION DATE:  02/12/2013 CONSULTATION DATE:  02/12/2013  REFERRING MD :  FPTS PRIMARY SERVICE:  PCCM  CHIEF COMPLAINT:  Hypotension.  BRIEF PATIENT DESCRIPTION:  78 yo admitted from SNF with acute encephalopathy, hypotension and UTI.  SIGNIFICANT EVENTS / STUDIES:  11/30  Admitted from SNF with acute encephalopathy, hypotension and UTI.  LINES / TUBES: R IJ CVL 11/30 >>> Foley 11/130 >>>  CULTURES: 11/30 Blood >>> GPC >>> 11/30 Urine >>>  ANTIBIOTICS: Aztreonam 11/30 >>> Levaquin 11/30 >>> 12/1 Vancomycin IV 12/1 >>> Flagyl  12/1 >>> Vancomycin PO 12/1 >>>   INTERVAL HISTORY:  No acute overnight events.  VITAL SIGNS: Temp:  [97.6 F (36.4 C)-98.7 F (37.1 C)] 98.7 F (37.1 C) (12/01 1221) Pulse Rate:  [69-102] 76 (12/01 1300) Resp:  [14-25] 17 (12/01 1300) BP: (77-116)/(35-77) 104/77 mmHg (12/01 1300) SpO2:  [93 %-100 %] 100 % (12/01 1300) Weight:  [109.1 kg (240 lb 8.4 oz)] 109.1 kg (240 lb 8.4 oz) (12/01 0200) HEMODYNAMICS: CVP:  [4 mmHg-11 mmHg] 11 mmHg VENTILATOR SETTINGS:   INTAKE / OUTPUT: Intake/Output     11/30 0701 - 12/01 0700 12/01 0701 - 12/02 0700   I.V. (mL/kg) 4141.6 (38) 1568.3 (14.4)   Blood 325.5    Other 200    IV Piggyback  1750   Total Intake(mL/kg) 4667.1 (42.8) 3318.3 (30.4)   Net +4667.1 +3318.3        Stool Occurrence 2 x      PHYSICAL EXAMINATION: General:  No distress Neuro:  Somnolent, arouses to stimulation, gag / cough weak HEENT:  PERRL Cardiovascular:  RRR, no m/r/g Lungs:  Bilateral rhonchi Abdomen:  Soft, nontender, bowel sounds diminished Musculoskeletal:  Trace dema Skin:  Intact  LABS: CBC  Recent Labs Lab 02/12/13 1543 02/12/13 2109 02/13/13 0433  WBC 31.6* 28.8* 26.9*  HGB 6.7* 7.8* 7.7*  HCT 21.3* 24.3* 23.3*  PLT 323 340 323   Coag's No results found for this basename: APTT, INR,  in the last 168  hours BMET  Recent Labs Lab 02/12/13 1046 02/12/13 1743 02/13/13 0433  NA 137 138 140  K 6.6* 5.8* 4.9  CL 105 106 111  CO2 14* 16* 18*  BUN 72* 71* 74*  CREATININE 3.55* 3.45* 3.20*  GLUCOSE 137* 173* 142*   Electrolytes  Recent Labs Lab 02/12/13 1046 02/12/13 1543 02/12/13 1743 02/13/13 0433  CALCIUM 8.1*  --  8.0* 7.7*  MG  --  2.7*  --  2.6*  PHOS  --  5.6*  --  5.5*   Sepsis Markers  Recent Labs Lab 02/12/13 1241 02/12/13 1355 02/13/13 1118  LATICACIDVEN 4.54* 5.1* 1.0   ABG  Recent Labs Lab 02/12/13 1605  PHART 7.382  PCO2ART 22.4*  PO2ART 89.0   Liver Enzymes  Recent Labs Lab 02/12/13 1046  AST 114*  ALT 74*  ALKPHOS 189*  BILITOT 0.4  ALBUMIN 1.7*   Cardiac Enzymes No results found for this basename: TROPONINI, PROBNP,  in the last 168 hours  Glucose  Recent Labs Lab 02/12/13 1517 02/12/13 1928 02/12/13 2359 02/13/13 0358 02/13/13 0826 02/13/13 1131  GLUCAP 137* 153* 136* 119* 122* 100*   CXR:  12/1 >>> Hypoinflation, no overt airspace disease  ASSESSMENT / PLAN:  PULMONARY A:  No active issues. P:   Gaol SpO2>92 Supplemental oxygen PRN DNI per sister - medical POA  CARDIOVASCULAR A: Severe sepsis,  likely secondary to urinary source vs bacteremia vs cdif. P:  Goal MAP>60 Trend lactate Levophed gtt Continue ASA, Lipitor Hold Metoprolol / Lisinoprol  RENAL A:  Acute on chronic renal failure. P:   Goal CVP>10 Trend BMP NS@125  NS 1000 x 1 Hold Lasix  GASTROINTESTINAL A:  Elevated transaminases, likely shocked liver.  Protein calorie malnutrition.  P:   NPO Protonix for GI Px Trend LFT Place [employer] TF per Nutrition  HEMATOLOGIC A:  Anemia ( acute / chronic? ). P:  Trend CBC Heparin for DVT Px Transfused 1 U PRBC  INFECTIOUS A:  UTI.  C.diff. PCN allergies.  GPC in blood. Breast mass vs abscess. P:   Cultures and antibiotics as above D/c Levaquin as c.dif Add Vanco PO / Flagyl for  c.dif Add Vanco IV for GPC PCT Breast US  ENDOCRINE  A:  Hyperglycemia. Suspected AI. P:   SSI Send Cortisol  NEUROLOGIC A:  Multiple CVAs with L hemiplegia and aphasia. P:   Hold Lexapro  NO CPR, NO INTUBATION, NO DIFIB  I have personally obtained history, examined patient, evaluated and interpreted laboratory and imaging results, reviewed medical records, formulated assessment / plan and placed orders.  CRITICAL CARE:  The patient is critically ill with multiple organ systems failure and requires high complexity decision making for assessment and support, frequent evaluation and titration of therapies, application of advanced monitoring technologies and extensive interpretation of multiple databases. Critical Care Time devoted to patient care services described in this note is 40 minutes.   Lonia Farber, MD Pulmonary and Critical Care Medicine [facility] Pager: [phone]  02/13/2013, 2:52 PM

## 2013-02-13 ENCOUNTER — Inpatient Hospital Stay (HOSPITAL_COMMUNITY): Payer: PRIVATE HEALTH INSURANCE

## 2013-02-13 DIAGNOSIS — I6350 Cerebral infarction due to unspecified occlusion or stenosis of unspecified cerebral artery: Secondary | ICD-10-CM

## 2013-02-13 LAB — TYPE AND SCREEN
ABO/RH(D): B POS
Antibody Screen: NEGATIVE
Unit division: 0

## 2013-02-13 LAB — CBC WITH DIFFERENTIAL/PLATELET
Basophils Absolute: 0 10*3/uL (ref 0.0–0.1)
Basophils Relative: 0 % (ref 0–1)
Eosinophils Relative: 0 % (ref 0–5)
Hemoglobin: 6.8 g/dL — CL (ref 12.0–15.0)
Lymphs Abs: 1.7 10*3/uL (ref 0.7–4.0)
MCH: 26.1 pg (ref 26.0–34.0)
MCHC: 31.1 g/dL (ref 30.0–36.0)
MCV: 83.9 fL (ref 78.0–100.0)
Monocytes Absolute: 2 10*3/uL — ABNORMAL HIGH (ref 0.1–1.0)
RBC: 2.61 MIL/uL — ABNORMAL LOW (ref 3.87–5.11)

## 2013-02-13 LAB — CBC
HCT: 23.3 % — ABNORMAL LOW (ref 36.0–46.0)
Hemoglobin: 7.7 g/dL — ABNORMAL LOW (ref 12.0–15.0)
MCH: 27.3 pg (ref 26.0–34.0)
MCHC: 33 g/dL (ref 30.0–36.0)
MCV: 82.6 fL (ref 78.0–100.0)
Platelets: 323 10*3/uL (ref 150–400)
RDW: 17.1 % — ABNORMAL HIGH (ref 11.5–15.5)

## 2013-02-13 LAB — LACTIC ACID, PLASMA: Lactic Acid, Venous: 1.1 mmol/L (ref 0.5–2.2)

## 2013-02-13 LAB — GLUCOSE, CAPILLARY
Glucose-Capillary: 100 mg/dL — ABNORMAL HIGH (ref 70–99)
Glucose-Capillary: 122 mg/dL — ABNORMAL HIGH (ref 70–99)
Glucose-Capillary: 136 mg/dL — ABNORMAL HIGH (ref 70–99)
Glucose-Capillary: 63 mg/dL — ABNORMAL LOW (ref 70–99)

## 2013-02-13 LAB — BASIC METABOLIC PANEL
Calcium: 7.7 mg/dL — ABNORMAL LOW (ref 8.4–10.5)
Creatinine, Ser: 3.2 mg/dL — ABNORMAL HIGH (ref 0.50–1.10)
GFR calc non Af Amer: 13 mL/min — ABNORMAL LOW (ref >90)
Glucose, Bld: 142 mg/dL — ABNORMAL HIGH (ref 70–99)
Potassium: 4.9 mEq/L (ref 3.5–5.1)
Sodium: 140 mEq/L (ref 135–145)

## 2013-02-13 LAB — PHOSPHORUS: Phosphorus: 5.5 mg/dL — ABNORMAL HIGH (ref 2.3–4.6)

## 2013-02-13 LAB — MAGNESIUM: Magnesium: 2.6 mg/dL — ABNORMAL HIGH (ref 1.5–2.5)

## 2013-02-13 LAB — CORTISOL: Cortisol, Plasma: 15.6 ug/dL

## 2013-02-13 LAB — CLOSTRIDIUM DIFFICILE BY PCR: Toxigenic C. Difficile by PCR: POSITIVE — AB

## 2013-02-13 MED ORDER — VANCOMYCIN HCL 10 G IV SOLR
1500.0000 mg | INTRAVENOUS | Status: DC
  Administered 2013-02-15: 1500 mg via INTRAVENOUS
  Filled 2013-02-13: qty 1500

## 2013-02-13 MED ORDER — METRONIDAZOLE IN NACL 5-0.79 MG/ML-% IV SOLN
500.0000 mg | Freq: Three times a day (TID) | INTRAVENOUS | Status: DC
  Administered 2013-02-13 – 2013-02-21 (×24): 500 mg via INTRAVENOUS
  Filled 2013-02-13 (×29): qty 100

## 2013-02-13 MED ORDER — VANCOMYCIN 50 MG/ML ORAL SOLUTION
500.0000 mg | Freq: Four times a day (QID) | ORAL | Status: DC
  Administered 2013-02-13 – 2013-02-22 (×36): 500 mg via ORAL
  Filled 2013-02-13 (×43): qty 10

## 2013-02-13 MED ORDER — FUROSEMIDE 10 MG/ML IJ SOLN
40.0000 mg | Freq: Once | INTRAMUSCULAR | Status: AC
  Administered 2013-02-13: 40 mg via INTRAVENOUS
  Filled 2013-02-13: qty 4

## 2013-02-13 MED ORDER — VANCOMYCIN HCL 10 G IV SOLR
2000.0000 mg | Freq: Once | INTRAVENOUS | Status: AC
  Administered 2013-02-13: 2000 mg via INTRAVENOUS
  Filled 2013-02-13: qty 2000

## 2013-02-13 MED ORDER — SODIUM CHLORIDE 0.9 % IV BOLUS (SEPSIS)
1000.0000 mL | Freq: Once | INTRAVENOUS | Status: AC
  Administered 2013-02-13: 1000 mL via INTRAVENOUS

## 2013-02-13 MED ORDER — DEXTROSE 50 % IV SOLN
50.0000 mL | Freq: Once | INTRAVENOUS | Status: AC | PRN
  Administered 2013-02-13: 50 mL via INTRAVENOUS

## 2013-02-13 MED ORDER — DEXTROSE 50 % IV SOLN
INTRAVENOUS | Status: AC
  Administered 2013-02-13: 50 mL via INTRAVENOUS
  Filled 2013-02-13: qty 50

## 2013-02-13 MED ORDER — LEVOFLOXACIN IN D5W 500 MG/100ML IV SOLN: 500.0000 mg | INTRAVENOUS | Status: DC

## 2013-02-13 NOTE — Clinical Social Work Psychosocial (Signed)
Clinical Social Work Department BRIEF PSYCHOSOCIAL ASSESSMENT 02/13/2013  Patient:  [name], [name]     Account Number:  [id number]     Admit date:  02/12/2013  Clinical Social Worker:  Read Drivers  Date/Time:  02/13/2013 02:19 PM  Referred by:  Care Management  Date Referred:  02/13/2013 Referred for  SNF Placement   Other Referral:   none   Interview type:  Other - See comment Other interview type:   [name], sister    PSYCHOSOCIAL DATA Living Status:  FACILITY Admitted from facility:  [facility] Level of care:  Skilled Nursing Facility Primary support name:  [name] Primary support relationship to patient:  SIBLING Degree of support available:   strong    CURRENT CONCERNS Current Concerns  Post-Acute Placement   Other Concerns:   none    SOCIAL WORK ASSESSMENT / PLAN Pt is non-verbal and unable to participate in assessment. CSW introduced self and CSW role to pt sister.  Pt sister, [name] stated that the family is having a difficult time with pt at [facility].  [name] reports that pt has been at [facility] in the past few months and does not feel that Sonny [name] has taken very good care of her sister.  [facility] requests a facility search.  CSW explained to sister that CSW could not give a recommendation of a facility, but would be happy to send informaiton to the surrounding facilities for the family.  Sister was appreciative of CSW support and assistance.   Assessment/plan status:  Psychosocial Support/Ongoing Assessment of Needs Other assessment/ plan:   none   Information/referral to community resources:   SNF    PATIENT'S/FAMILY'S RESPONSE TO PLAN OF CARE: CSW was thanked for support and assistance to family with disposition.       Vickii Penna, LCSWA [phone]  Clinical Social Work

## 2013-02-13 NOTE — Progress Notes (Signed)
RN calling Elink saying that patient sounds congested  Patient is net 8 L positive since day of admission 02/12/2013  On camera exam she looks okay  No results found for this basename: PROBNP,  in the last 168 hours   Recent Labs Lab 02/12/13 1046 02/12/13 1743 02/13/13 0433  CREATININE 3.55* 3.45* 3.20*     A Lasix 40mg  IV x 1 Check bnp  Dr. Kalman Shan, M.D., [facility].C.P Pulmonary and Critical Care Medicine Staff Physician [facility] Pulmonary and Critical Care Pager: [phone], If no answer or between  15:00h - 7:00h: call [phone]  02/13/2013 6:44 PM

## 2013-02-13 NOTE — Progress Notes (Addendum)
ANTIBIOTIC CONSULT NOTE - FOLLOW UP  Pharmacy Consult:  Levaquin / Azactam Indication:  Rule out sepsis  Allergies  Allergen Reactions  . Penicillins Other (See Comments)    Per MAR  . Tetanus Toxoids Other (See Comments)    Per MAR  . Vicodin [Hydrocodone-Acetaminophen] Other (See Comments)    Per [facility]    Patient Measurements: Height: 5' 6.14" (168 cm) Weight: 240 lb 8.4 oz (109.1 kg) IBW/kg (Calculated) : 59.63  Vital Signs: Temp: 97.9 F (36.6 C) (12/01 0400) Temp src: Oral (12/01 0800) BP: 107/36 mmHg (12/01 0800) Pulse Rate: 73 (12/01 0800) Intake/Output from previous day: 11/30 0701 - 12/01 0700 In: 4667.1 [I.V.:4141.6; Blood:325.5] Out: -  Intake/Output from this shift: Total I/O In: 722.1 [I.V.:722.1] Out: -   Labs:  Recent Labs  02/12/13 1046 02/12/13 1543 02/12/13 1743 02/12/13 2109 02/13/13 0433  WBC 33.7* 31.6*  --  28.8* 26.9*  HGB 6.8* 6.7*  --  7.8* 7.7*  PLT 336 323  --  340 323  CREATININE 3.55*  --  3.45*  --  3.20*   Estimated Creatinine Clearance: 18.7 ml/min (by C-G formula based on Cr of 3.2). No results found for this basename: Rolm Gala, VANCORANDOM, GENTTROUGH, GENTPEAK, GENTRANDOM, TOBRATROUGH, TOBRAPEAK, TOBRARND, AMIKACINPEAK, AMIKACINTROU, AMIKACIN,  in the last 72 hours   Microbiology: Recent Results (from the past 720 hour(s))  URINE CULTURE     Status: None   Collection Time    01/14/13  5:26 PM      Result Value Range Status   Specimen Description URINE, CATHETERIZED   Final   Special Requests NONE   Final   Culture  Setup Time     Final   Value: 01/15/2013 18:05     Performed at [facility] Count     Final   Value: NO GROWTH     Performed at [facility]   Culture     Final   Value: NO GROWTH     Performed at [facility]   Report Status 01/16/2013 FINAL   Final  MRSA PCR SCREENING     Status: None   Collection Time    02/12/13  3:34 PM      Result Value Range  Status   MRSA by PCR NEGATIVE  NEGATIVE Final   Comment:            The GeneXpert MRSA Assay (FDA     approved for NASAL specimens     only), is one component of a     comprehensive MRSA colonization     surveillance program. It is not     intended to diagnose MRSA     infection nor to guide or     monitor treatment for     MRSA infections.      Assessment: 78 y.o. female presents from [facility] with hypotension, foul smelling urine, urinary sediment in foley bag.  Currently on Azactam and Levaquin for rule out urosepsis.  Patient has renal dysfunction.  11/30 Aztreonam >> 11/30 Levaquin >>  11/30 Urine - collected 11/30 MRSA PCR - negative 11/30 C.diff PCR - collected 11/30 blood cx - collected   Goal of Therapy:  Infection prevention / Clearance of infection   Plan:  - Change Levaquin to 500mg  IV Q48H, next dose on 02/14/13 - Continue Azactam 1gm IV Q8H - F/U renal fxn, micro data, clinical course    Sebastion Jun D. Laney Potash, PharmD, BCPS Pager:  [phone]  [phone] 02/13/2013, 8:10 AM   ===========================================  Addendum: - now +C.diff - blood culture grew GPC (1 of 2 thus far)   Goal of Therapy: Vanc trough 15-20 mcg/mL   Plan: - Vanc 2gm IV x 1, then 1500mg  IV Q48H - Continue Azactam 1gm IV Q8H until urine culture result returns - PO Vanc and IV Flagyl as ordered for C.diff - Monitor renal fxn, clinical course, vanc trough as indicated    Josiah Wojtaszek D. Laney Potash, PharmD, BCPS Pager:  [phone] 02/13/2013, 11:20 AM

## 2013-02-13 NOTE — Progress Notes (Signed)
INITIAL NUTRITION ASSESSMENT  DOCUMENTATION CODES Per approved criteria  -Obesity Unspecified   INTERVENTION: 1.  Modify diet; resume PO diet once medically appropriate per MD discretion. Ensure Pudding po TID once diet resumed, each supplement provides 170 kcal and 4 grams of protein.   NUTRITION DIAGNOSIS: Increased nutrient needs related to healing as evidenced by recent knee injury, now with UTI.   Monitor:  1.  Food/Beverage; diet advancement with pt meeting >/=90% estimated needs with tolerance. 2.  Wt/wt change; monitor trends  Reason for Assessment: MST  78 y.o. female  Admitting Dx: knee pain  ASSESSMENT: Pt admitted with hypotension and decreased responsiveness at her SNF.  Pt has been followed by RD at recent hospitalizations.  Her intake has been variable, but overall adequate with the addition of supplements. Per SLP at most recent admission- continue Dysphagia 1, thin liquids.  Pt not available at time of visit.  She is non-verbal and no family at bedside today.   RD to follow for ongoing assessment and interventions.   Current wt of 240 lbs is not consistent with her most recent weights of 215-220 lbs. Will continue to follow.   Height: Ht Readings from Last 1 Encounters:  02/13/13 5' 6.14" (1.68 m)    Weight: Wt Readings from Last 1 Encounters:  02/13/13 240 lb 8.4 oz (109.1 kg)    Ideal Body Weight: 130 lbs  % Ideal Body Weight: 184%  Wt Readings from Last 10 Encounters:  02/13/13 240 lb 8.4 oz (109.1 kg)  01/21/13 213 lb (96.616 kg)  01/14/13 213 lb 8 oz (96.843 kg)  12/15/12 221 lb 9.6 oz (100.517 kg)  12/18/12 219 lb (99.338 kg)  10/30/12 217 lb (98.431 kg)  09/01/12 227 lb (102.967 kg)  07/27/12 223 lb 8 oz (101.379 kg)  06/22/12 226 lb 12.8 oz (102.876 kg)  05/12/12 226 lb 3.2 oz (102.604 kg)    Usual Body Weight: 220 lbs  % Usual Body Weight: 109%  Estimated body mass index is 38.66 kg/(m^2) as calculated from the following:  Height as of this encounter: 5' 6.14" (1.68 m).   Weight as of this encounter: 240 lb 8.4 oz (109.1 kg).  Estimated Nutritional Needs: Kcal: 1680-1800 Protein: 77-90g Fluid: >1.8 L/day  Skin:  Stage 2 pressure ulcer to sacrum Stage 2 pressure ulcer to foot Generalized edema  Diet Order: NPO  EDUCATION NEEDS: -No education needs identified at this time   Intake/Output Summary (Last 24 hours) at 02/13/13 1440 Last data filed at 02/13/13 1344  Gross per 24 hour  Intake 5985.37 ml  Output      0 ml  Net 5985.37 ml    Last BM: 11/30  Labs:   Recent Labs Lab 02/12/13 1046 02/12/13 1543 02/12/13 1743 02/13/13 0433  NA 137  --  138 140  K 6.6*  --  5.8* 4.9  CL 105  --  106 111  CO2 14*  --  16* 18*  BUN 72*  --  71* 74*  CREATININE 3.55*  --  3.45* 3.20*  CALCIUM 8.1*  --  8.0* 7.7*  MG  --  2.7*  --  2.6*  PHOS  --  5.6*  --  5.5*  GLUCOSE 137*  --  173* 142*    CBG (last 3)   Recent Labs  02/13/13 0358 02/13/13 0826 02/13/13 1131  GLUCAP 119* 122* 100*    Scheduled Meds: . aztreonam  1 g Intravenous Q8H  . calcium gluconate  1 g  Intravenous Once  . heparin  5,000 Units Subcutaneous Q8H  . insulin aspart  2-6 Units Subcutaneous Q4H  . vancomycin  500 mg Oral Q6H   And  . metronidazole  500 mg Intravenous Q8H  . pantoprazole (PROTONIX) IV  40 mg Intravenous Q24H  . [START ON 02/15/2013] vancomycin  1,500 mg Intravenous Q48H  . vancomycin  2,000 mg Intravenous Once    Continuous Infusions: . sodium chloride 1,000 mL (02/13/13 0745)  . norepinephrine (LEVOPHED) Adult infusion 10 mcg/min (02/13/13 1230)  . vasopressin (PITRESSIN) infusion - *FOR SHOCK*      Past Medical History  Diagnosis Date  . Aphasia   . Muscle weakness   . Malaise   . Fatigue   . Breast neoplasm   . Hypertension   . Glaucoma   . UTI (lower urinary tract infection)   . Vitamin D deficiency   . Hiatal hernia   . Constipation   . Diarrhea   . Asthma   . Obesity   .  Hyperlipidemia   . Alzheimer's dementia   . Depression   . Allergic rhinitis   . Dementia   . DVT (deep venous thrombosis) 2014    "put her on Xeralto" (01/20/2013)  . Pneumonia 2014  . Shortness of breath     "lately; just laying there" (01/20/2013)  . Type II diabetes mellitus   . Anemia   . History of blood transfusion     "got a unit yesterday" (01/20/2013)  . Stroke 2006; 2007    residual:  "mute, dementia, generalized weakness" (01/20/2013)  . Recurrent UTI     "at least 3 since 2008 when she went into nursing home" (01/20/2013)  . Breast cancer     "right" (01/20/2013)    Past Surgical History  Procedure Laterality Date  . Joint replacement      left knee in '90s  . Breast lumpectomy Right 1990's; 2000's    "she's had 2" (01/20/2013)  . Replacement total knee bilateral Bilateral 1990's    Loyce Dys, MS RD LDN Clinical Inpatient Dietitian Pager: [phone] Weekend/After hours pager: [phone]

## 2013-02-14 DIAGNOSIS — I059 Rheumatic mitral valve disease, unspecified: Secondary | ICD-10-CM

## 2013-02-14 DIAGNOSIS — D649 Anemia, unspecified: Secondary | ICD-10-CM

## 2013-02-14 LAB — URINE CULTURE: Colony Count: >=100000

## 2013-02-14 LAB — CBC
HCT: 22.8 % — ABNORMAL LOW (ref 36.0–46.0)
Hemoglobin: 7.1 g/dL — ABNORMAL LOW (ref 12.0–15.0)
MCHC: 31.1 g/dL (ref 30.0–36.0)
MCV: 83.5 fL (ref 78.0–100.0)
WBC: 22.1 10*3/uL — ABNORMAL HIGH (ref 4.0–10.5)

## 2013-02-14 LAB — GLUCOSE, CAPILLARY
Glucose-Capillary: 80 mg/dL (ref 70–99)
Glucose-Capillary: 57 mg/dL — ABNORMAL LOW (ref 70–99)
Glucose-Capillary: 85 mg/dL (ref 70–99)
Glucose-Capillary: 93 mg/dL (ref 70–99)
Glucose-Capillary: 102 mg/dL — ABNORMAL HIGH (ref 70–99)

## 2013-02-14 LAB — PRO B NATRIURETIC PEPTIDE: Pro B Natriuretic peptide (BNP): 17311 pg/mL — ABNORMAL HIGH (ref 0–450)

## 2013-02-14 LAB — BASIC METABOLIC PANEL WITH GFR
BUN: 70 mg/dL — ABNORMAL HIGH (ref 6–23)
CO2: 17 meq/L — ABNORMAL LOW (ref 19–32)
Calcium: 7.5 mg/dL — ABNORMAL LOW (ref 8.4–10.5)
Chloride: 110 meq/L (ref 96–112)
Creatinine, Ser: 2.71 mg/dL — ABNORMAL HIGH (ref 0.50–1.10)
GFR calc Af Amer: 19 mL/min — ABNORMAL LOW
GFR calc non Af Amer: 16 mL/min — ABNORMAL LOW
Glucose, Bld: 225 mg/dL — ABNORMAL HIGH (ref 70–99)
Potassium: 4.4 meq/L (ref 3.5–5.1)
Sodium: 139 meq/L (ref 135–145)

## 2013-02-14 LAB — LACTIC ACID, PLASMA: Lactic Acid, Venous: 1 mmol/L (ref 0.5–2.2)

## 2013-02-14 MED ORDER — ASPIRIN 81 MG PO CHEW
324.0000 mg | CHEWABLE_TABLET | Freq: Every day | ORAL | Status: DC
  Administered 2013-02-14 – 2013-02-22 (×9): 324 mg via ORAL
  Filled 2013-02-14 (×10): qty 4

## 2013-02-14 MED ORDER — CHLORHEXIDINE GLUCONATE 0.12 % MT SOLN
15.0000 mL | Freq: Two times a day (BID) | OROMUCOSAL | Status: DC
  Administered 2013-02-14 – 2013-02-22 (×16): 15 mL via OROMUCOSAL
  Filled 2013-02-14 (×18): qty 15

## 2013-02-14 MED ORDER — BIOTENE DRY MOUTH MT LIQD
15.0000 mL | OROMUCOSAL | Status: DC
  Administered 2013-02-14 – 2013-02-22 (×42): 15 mL via OROMUCOSAL

## 2013-02-14 MED ORDER — ENOXAPARIN SODIUM 120 MG/0.8ML ~~LOC~~ SOLN
110.0000 mg | SUBCUTANEOUS | Status: DC
  Administered 2013-02-14: 110 mg via SUBCUTANEOUS
  Filled 2013-02-14 (×2): qty 0.8

## 2013-02-14 MED ORDER — DEXTROSE 50 % IV SOLN
INTRAVENOUS | Status: AC
  Administered 2013-02-14: 06:00:00
  Filled 2013-02-14: qty 50

## 2013-02-14 MED ORDER — PRO-STAT SUGAR FREE PO LIQD
30.0000 mL | Freq: Two times a day (BID) | ORAL | Status: DC
  Administered 2013-02-14 – 2013-02-15 (×4): 30 mL
  Filled 2013-02-14 (×6): qty 30

## 2013-02-14 MED ORDER — SODIUM CHLORIDE 0.9 % IV SOLN
1000.0000 mL | INTRAVENOUS | Status: DC
  Administered 2013-02-14: 1000 mL via INTRAVENOUS

## 2013-02-14 MED ORDER — JEVITY 1.2 CAL PO LIQD
1000.0000 mL | ORAL | Status: DC
Start: 2013-02-14 — End: 2013-02-16
  Administered 2013-02-14: 12:00:00
  Administered 2013-02-15: 1000 mL
  Filled 2013-02-14 (×5): qty 1000

## 2013-02-14 MED ORDER — HYDROCORTISONE SOD SUCCINATE 100 MG IJ SOLR
50.0000 mg | Freq: Four times a day (QID) | INTRAMUSCULAR | Status: DC
  Administered 2013-02-14 – 2013-02-16 (×9): 50 mg via INTRAVENOUS
  Filled 2013-02-14 (×12): qty 1

## 2013-02-14 MED ORDER — COLLAGENASE 250 UNIT/GM EX OINT
TOPICAL_OINTMENT | Freq: Every day | CUTANEOUS | Status: DC
  Administered 2013-02-14 – 2013-02-16 (×3): via TOPICAL
  Administered 2013-02-17: 1 via TOPICAL
  Administered 2013-02-18 – 2013-02-21 (×4): via TOPICAL
  Filled 2013-02-14: qty 30

## 2013-02-14 NOTE — Progress Notes (Signed)
eLink Physician-Brief Progress Note Patient Name: [name] DOB: Jan 11, 1935 MRN: [id number]  Date of Service  02/14/2013   HPI/Events of Note  Patient is 8 liters net positive, HD stable.  Had received lasix earlier for concern over pulm congestion.  Has IVFs running at 125 cc/hr.   eICU Interventions  Plan: IVFs to [facility]      DETERDING,ELIZABETH 02/14/2013, 12:32 AM

## 2013-02-14 NOTE — Consult Note (Signed)
WOC wound consult note Reason for Consult:Consult requested for sacral pressure ulcer.  Pt with multiple systemic factors which can impair healing; incontinent frequently of stool, C-diff, immobile, high BMI, low albumin. Frequently moist in sacral area. Wound type: This was noted as a stage 3 wound during previous admission, refer to previous WOC progress notes on 11/9.  She was re-admitted on 11/30 and wound is still 50% red and stage 3, but other 50% has declined to unstageable. Pressure Ulcer POA: Yes Measurement:3X5X.5cm Drainage (amount, consistency, odor) Large amt dark brown drainage with some odor. Periwound: Intact skin surrounding. Dressing procedure/placement/frequency: Santyl ointment to chemically debride nonviable tissue.  Pt on Sport low air loss bed to reduce pressure.   Please re-consult if further assistance is needed.  Thank-you,  Cammie Mcgee MSN, RN, CWOCN, [place], CNS [phone]

## 2013-02-14 NOTE — Progress Notes (Signed)
NUTRITION FOLLOW UP  Intervention:   1.  Enteral nutrition; initiate Jevity 1.2 @ 20 mL/hr continuous.  Advance by 10 mL q 4 hrs to 60 mL/hr with Prostat BID to provide 1928 kcal, 109g protein, 1103 mL free water.  NUTRITION DIAGNOSIS:  Increased nutrient needs related to healing as evidenced by recent knee injury, now with UTI.   Monitor:  1. Food/Beverage; diet advancement with pt meeting >/=90% estimated needs with tolerance. Not appropriate at this time.  2.  Enteral nutrition; initiation with tolerance.  Pt to meet >/=90% estimated needs with nutrition support.  3. Wt/wt change; monitor trends, ongoing.   Assessment:   Pt admitted with hypotension and decreased responsiveness at her SNF.  Pt has been followed by RD at recent hospitalizations. Her intake has been variable, but overall adequate with the addition of supplements. Per SLP at most recent admission- continue Dysphagia 1, thin liquids.  Pt remains NPO in setting of AMS.  RD has been consulted for TFs.  Pt with NGT.  Per KUB, pt tube requiring advancement by 10 cm.  Per RN, this has been done.   Pt with low albumin and anasarca.  Albumin has a half-life of 21 days and is strongly affected by stress response and inflammatory process, therefore, do not expect to see an improvement in this lab value during acute hospitalization.   Height: Ht Readings from Last 1 Encounters:  02/13/13 5' 6.14" (1.68 m)    Weight Status:   Wt Readings from Last 1 Encounters:  02/14/13 247 lb 12.8 oz (112.4 kg)    Re-estimated needs:  Kcal: 1805-2020 Protein: 106-125g Fluid: >1.8 L/day  Skin:  Stage 2 pressure ulcer to sacrum  Stage 2 pressure ulcer to foot  Generalized edema  Diet Order: NPO   Intake/Output Summary (Last 24 hours) at 02/14/13 1053 Last data filed at 02/14/13 1000  Gross per 24 hour  Intake 4000.2 ml  Output   2450 ml  Net 1550.2 ml    Last BM: 12/2  Labs:   Recent Labs Lab 02/12/13 1046  02/12/13 1543 02/12/13 1743 02/13/13 0433 02/14/13 0415  NA 137  --  138 140 139  K 6.6*  --  5.8* 4.9 4.4  CL 105  --  106 111 110  CO2 14*  --  16* 18* 17*  BUN 72*  --  71* 74* 70*  CREATININE 3.55*  --  3.45* 3.20* 2.71*  CALCIUM 8.1*  --  8.0* 7.7* 7.5*  MG  --  2.7*  --  2.6*  --   PHOS  --  5.6*  --  5.5*  --   GLUCOSE 137*  --  173* 142* 225*    CBG (last 3)   Recent Labs  02/14/13 0343 02/14/13 0533 02/14/13 0809  GLUCAP 57* 85 93    Scheduled Meds: . antiseptic oral rinse  15 mL Mouth Rinse Q4H  . aspirin  324 mg Oral Daily  . calcium gluconate  1 g Intravenous Once  . chlorhexidine  15 mL Mouth/Throat BID  . enoxaparin (LOVENOX) injection  110 mg Subcutaneous Q24H  . hydrocortisone sodium succinate  50 mg Intravenous Q6H  . insulin aspart  2-6 Units Subcutaneous Q4H  . vancomycin  500 mg Oral Q6H   And  . metronidazole  500 mg Intravenous Q8H  . [START ON 02/15/2013] vancomycin  1,500 mg Intravenous Q48H    Continuous Infusions: . sodium chloride 1,000 mL (02/14/13 0252)    Loyce Dys,  MS RD LDN Clinical Inpatient Dietitian Pager: [phone] Weekend/After hours pager: [phone]

## 2013-02-14 NOTE — Progress Notes (Signed)
ANTICOAGULATION CONSULT NOTE - Initial Consult  Pharmacy Consult:  Lovenox Indication:  History of DVT (?in Feb 2014)  Allergies  Allergen Reactions  . Penicillins Other (See Comments)    Per MAR  . Tetanus Toxoids Other (See Comments)    Per MAR  . Vicodin [Hydrocodone-Acetaminophen] Other (See Comments)    Per [facility]    Patient Measurements: Height: 5' 6.14" (168 cm) Weight: 247 lb 12.8 oz (112.4 kg) IBW/kg (Calculated) : 59.63  Vital Signs: Temp: 98.5 F (36.9 C) (12/02 0810) Temp src: Oral (12/02 0810) BP: 96/49 mmHg (12/02 1000) Pulse Rate: 81 (12/02 1000)  Labs:  Recent Labs  02/12/13 1743 02/12/13 2109 02/13/13 0433 02/14/13 0415  HGB  --  7.8* 7.7* 7.1*  HCT  --  24.3* 23.3* 22.8*  PLT  --  340 323 305  CREATININE 3.45*  --  3.20* 2.71*    Estimated Creatinine Clearance: 22.5 ml/min (by C-G formula based on Cr of 2.71).   Medical History: Past Medical History  Diagnosis Date  . Aphasia   . Muscle weakness   . Malaise   . Fatigue   . Breast neoplasm   . Hypertension   . Glaucoma   . UTI (lower urinary tract infection)   . Vitamin D deficiency   . Hiatal hernia   . Constipation   . Diarrhea   . Asthma   . Obesity   . Hyperlipidemia   . Alzheimer's dementia   . Depression   . Allergic rhinitis   . Dementia   . DVT (deep venous thrombosis) 2014    "put her on Xeralto" (01/20/2013)  . Pneumonia 2014  . Shortness of breath     "lately; just laying there" (01/20/2013)  . Type II diabetes mellitus   . Anemia   . History of blood transfusion     "got a unit yesterday" (01/20/2013)  . Stroke 2006; 2007    residual:  "mute, dementia, generalized weakness" (01/20/2013)  . Recurrent UTI     "at least 3 since 2008 when she went into nursing home" (01/20/2013)  . Breast cancer     "right" (01/20/2013)       Assessment: 50 YOF with history of DVT on Xarelto PTA.  She was started on SQ heparin upon admission, now to switch to treatment dose of  Lovenox instead of resuming Xarelto due to decreased renal clearance.  Patient renal function, however, is improving.  Aware of patient's hemoglobin; no bleeding reported.  Goal of Therapy:  Anti-Xa level 0.6-1.2 units/ml 4hrs after LMWH dose given Monitor platelets by anticoagulation protocol: Yes Vancomycin trough level 15-20 mcg/ml   Plan:  - Lovenox 110mg  SQ Q24H d/t CrCL < 30 ml/hr - CBC Q72H while on Lovenox - Vanc 1500mg  IV Q48H - F/U renal fxn, micro data, clinical course, ECHO    Efosa Treichler D. Laney Potash, PharmD, BCPS Pager:  [phone] 02/14/2013, 11:52 AM

## 2013-02-14 NOTE — Progress Notes (Signed)
Echocardiogram 2D Echocardiogram has been performed.  [name] 02/14/2013, 12:57 PM

## 2013-02-14 NOTE — Progress Notes (Addendum)
PULMONARY  / CRITICAL CARE MEDICINE  Name: [name] MRN: [id number] DOB: Oct 16, 1934    ADMISSION DATE:  02/12/2013 CONSULTATION DATE:  02/12/2013  REFERRING MD :  FPTS PRIMARY SERVICE:  PCCM  CHIEF COMPLAINT:  Hypotension.  BRIEF PATIENT DESCRIPTION:  78 yo admitted from SNF with acute encephalopathy, hypotension and UTI.  SIGNIFICANT EVENTS / STUDIES:  11/30  Admitted from SNF with acute encephalopathy, hypotension and UTI. 12/01  Breast US >>> Seroma at lumpectomy site, present on older exams  LINES / TUBES: R IJ CVL 11/30 >>> NGT 11/30 >>> Foley 11/130 >>>  CULTURES: 11/30 Blood >>> GPC >>> 11/30 Urine >>> neg  ANTIBIOTICS: Aztreonam 11/30 >>> 12/02 Levaquin 11/30 >>> 12/1 Vancomycin IV 12/1 >>> Flagyl  12/1 >>> Vancomycin PO 12/1 >>>   INTERVAL HISTORY:  Off pressors.  Hypoglycemic episode.  VITAL SIGNS: Temp:  [97.4 F (36.3 C)-98.7 F (37.1 C)] 98.5 F (36.9 C) (12/02 0810) Pulse Rate:  [47-99] 78 (12/02 0900) Resp:  [12-27] 14 (12/02 0900) BP: (88-126)/(24-86) 126/58 mmHg (12/02 0900) SpO2:  [11 %-100 %] 100 % (12/02 0900) Weight:  [80.3 kg (177 lb 0.5 oz)] 80.3 kg (177 lb 0.5 oz) (12/02 0500)  HEMODYNAMICS: CVP:  [7 mmHg-11 mmHg] 9 mmHg VENTILATOR SETTINGS:   INTAKE / OUTPUT: Intake/Output     12/01 0701 - 12/02 0700 12/02 0701 - 12/03 0700   I.V. (mL/kg) 3052.3 (38) 40 (0.5)   Blood     Other     IV Piggyback 1950    Total Intake(mL/kg) 5002.3 (62.3) 40 (0.5)   Urine (mL/kg/hr) 2100 (1.1) 350 (1.4)   Total Output 2100 350   Net +2902.3 -310          PHYSICAL EXAMINATION: General:  Comfortable Neuro:  Arouses to stimulation, does not follow commands, non verbal HEENT:  PERRL Cardiovascular:  RRR, no m/r/g Lungs:  Bilateral rhonchi Abdomen:  Soft, nontender, bowel sounds diminished Musculoskeletal:  Anasarca, bandage around RLE Skin:  Sacral stage 2 pressure ulcer  LABS: CBC  Recent Labs Lab 02/12/13 2109 02/13/13 0433  02/14/13 0415  WBC 28.8* 26.9* 22.1*  HGB 7.8* 7.7* 7.1*  HCT 24.3* 23.3* 22.8*  PLT 340 323 305   Coag's No results found for this basename: APTT, INR,  in the last 168 hours BMET  Recent Labs Lab 02/12/13 1743 02/13/13 0433 02/14/13 0415  NA 138 140 139  K 5.8* 4.9 4.4  CL 106 111 110  CO2 16* 18* 17*  BUN 71* 74* 70*  CREATININE 3.45* 3.20* 2.71*  GLUCOSE 173* 142* 225*   Electrolytes  Recent Labs Lab 02/12/13 1046 02/12/13 1543 02/12/13 1743 02/13/13 0433 02/14/13 0415  CALCIUM 8.1*  --  8.0* 7.7* 7.5*  MG  --  2.7*  --  2.6*  --   PHOS  --  5.6*  --  5.5*  --    Sepsis Markers  Recent Labs Lab 02/13/13 1118 02/13/13 1600 02/14/13 0415  LATICACIDVEN 1.0 1.1 1.0   ABG  Recent Labs Lab 02/12/13 1605  PHART 7.382  PCO2ART 22.4*  PO2ART 89.0   Liver Enzymes  Recent Labs Lab 02/12/13 1046  AST 114*  ALT 74*  ALKPHOS 189*  BILITOT 0.4  ALBUMIN 1.7*   Cardiac Enzymes  Recent Labs Lab 02/14/13 0415  PROBNP 17311.0*   Glucose  Recent Labs Lab 02/13/13 1639 02/13/13 1956 02/14/13 0028 02/14/13 0343 02/14/13 0533 02/14/13 0809  GLUCAP 77 63* 80 57* 85 93   CXR:  12/1 >>> Hypoinflation, no overt airspace disease  ASSESSMENT / PLAN:  PULMONARY A:  No active issues. P:   Gaol SpO2>92 Supplemental oxygen PRN DNI per sister - medical POA  CARDIOVASCULAR A: Severe sepsis, likely secondary to urinary source vs bacteremia vs cdif- resolving. P:  Start ASA Goal MAP>60 D/c Levophed gtt Continue ASA, Lipitor Hold Metoprolol / Lisinopril Suggest discussing with POA NOT restarting vasopressors  TTE to r/o endocarditis  RENAL A:  Acute on chronic renal failure - improving.  Anasarca. P:   Goal CVP>10 Trend BMP NS@KVO  Lasix x 1 last night  GASTROINTESTINAL A:  Elevated transaminases, likely shocked liver.  Protein calorie malnutrition. Dysphagia. P:   NPO D/c Protonix as off pressors Trend LFT TF per  Nutrition  HEMATOLOGIC A:  Anemia ( acute / chronic? ).  DVT, Xarelto preadmission. P:  Trend CBC D/c Heparin No Xarelto due to impaired renal function Lovenox treatment dose  INFECTIOUS A:  UTI.  C.diff. PCN allergies.  GPC in blood. Breast seroma, present on old exams, doubt infected. P:   D/c Aztreonam  ENDOCRINE  A:  Hyperglycemia. Adrenal insufficiency ( cortisol 15 in presence of shock ). 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  Unknown baseline. P:   Hold Lexapro Aggrenox when able Consider MRI to r/o subacute CVA  NO CPR, intubation, defibrillation Consider NO vasopressors Transfer to SDU. PFTS to pick up 12/03.  I have personally obtained history, examined patient, evaluated and interpreted laboratory and imaging results, reviewed medical records, formulated assessment / plan and placed orders.  Lonia Farber, MD Pulmonary and Critical Care Medicine [facility] Pager: [phone]  02/14/2013, 10:07 AM

## 2013-02-15 LAB — BASIC METABOLIC PANEL
BUN: 70 mg/dL — ABNORMAL HIGH (ref 6–23)
CO2: 19 mEq/L (ref 19–32)
Chloride: 115 mEq/L — ABNORMAL HIGH (ref 96–112)
Creatinine, Ser: 2.15 mg/dL — ABNORMAL HIGH (ref 0.50–1.10)
GFR calc Af Amer: 24 mL/min — ABNORMAL LOW (ref >90)
Glucose, Bld: 185 mg/dL — ABNORMAL HIGH (ref 70–99)
Potassium: 4.2 mEq/L (ref 3.5–5.1)

## 2013-02-15 LAB — GLUCOSE, CAPILLARY
Glucose-Capillary: 162 mg/dL — ABNORMAL HIGH (ref 70–99)
Glucose-Capillary: 174 mg/dL — ABNORMAL HIGH (ref 70–99)
Glucose-Capillary: 177 mg/dL — ABNORMAL HIGH (ref 70–99)
Glucose-Capillary: 193 mg/dL — ABNORMAL HIGH (ref 70–99)
Glucose-Capillary: 224 mg/dL — ABNORMAL HIGH (ref 70–99)

## 2013-02-15 LAB — CBC
HCT: 23.1 % — ABNORMAL LOW (ref 36.0–46.0)
Hemoglobin: 7.2 g/dL — ABNORMAL LOW (ref 12.0–15.0)
RBC: 2.79 MIL/uL — ABNORMAL LOW (ref 3.87–5.11)
RDW: 17.5 % — ABNORMAL HIGH (ref 11.5–15.5)
WBC: 14.6 10*3/uL — ABNORMAL HIGH (ref 4.0–10.5)

## 2013-02-15 LAB — CULTURE, BLOOD (ROUTINE X 2)

## 2013-02-15 MED ORDER — RIVAROXABAN 20 MG PO TABS
20.0000 mg | ORAL_TABLET | Freq: Every day | ORAL | Status: DC
  Administered 2013-02-16 – 2013-02-21 (×6): 20 mg via ORAL
  Filled 2013-02-15 (×7): qty 1

## 2013-02-15 MED ORDER — ENOXAPARIN SODIUM 120 MG/0.8ML ~~LOC~~ SOLN
110.0000 mg | Freq: Once | SUBCUTANEOUS | Status: AC
  Administered 2013-02-15: 110 mg via SUBCUTANEOUS
  Filled 2013-02-15: qty 0.8

## 2013-02-15 MED ORDER — RIVAROXABAN 20 MG PO TABS: 20.0000 mg | ORAL_TABLET | Freq: Every day | ORAL | Status: DC

## 2013-02-15 NOTE — Progress Notes (Signed)
FMTS Attending Daily Note:  Renold Don MD  [phone] pager  Family Practice pager:  [phone] I have discussed this patient with the resident Dr. Clinton Sawyer.  I agree with their findings, assessment, and care plan

## 2013-02-15 NOTE — Evaluation (Signed)
Clinical/Bedside Swallow Evaluation Patient Details  Name: [name] MRN: [id number] Date of Birth: [birth year]  Today's Date: 02/15/2013 Time: 1420-1505 SLP Time Calculation (min): 45 min  Past Medical History:  Past Medical History  Diagnosis Date  . Aphasia   . Muscle weakness   . Malaise   . Fatigue   . Breast neoplasm   . Hypertension   . Glaucoma   . UTI (lower urinary tract infection)   . Vitamin D deficiency   . Hiatal hernia   . Constipation   . Diarrhea   . Asthma   . Obesity   . Hyperlipidemia   . Alzheimer's dementia   . Depression   . Allergic rhinitis   . Dementia   . DVT (deep venous thrombosis) 2014    "put her on Xeralto" (01/20/2013)  . Pneumonia 2014  . Shortness of breath     "lately; just laying there" (01/20/2013)  . Type II diabetes mellitus   . Anemia   . History of blood transfusion     "got a unit yesterday" (01/20/2013)  . Stroke 2006; 2007    residual:  "mute, dementia, generalized weakness" (01/20/2013)  . Recurrent UTI     "at least 3 since 2008 when she went into nursing home" (01/20/2013)  . Breast cancer     "right" (01/20/2013)   Past Surgical History:  Past Surgical History  Procedure Laterality Date  . Joint replacement      left knee in '90s  . Breast lumpectomy Right 1990's; 2000's    "she's had 2" (01/20/2013)  . Replacement total knee bilateral Bilateral 1990's   HPI:  78 year old female admitted 02/12/13  due to hypotension, decreased responsiveness, and UTI.  PMH significant for breast neoplasm, vascular dementia, CKD, DM, HTN, sacral ulcers, PNA, CVA x3, hiatal hernia.  CXR revealed low volumes, vascular crowding vs vascular congestion.  Pt was seen for BSE last month, at which time Puree diet with thin liquids was recommended.   Assessment / Plan / Recommendation Clinical Impression  Pt allowed oral care with suction.  Oral motor strength and function appear adequate based on observation, however, pt does not follow  commands and is nonvocal.  No overt s/s aspiration observed with thin or puree consistencies, which is the diet recommended following last month's BSE.  Oral prep and propulsion appears to have improved since diet tolerance assessment, so Dys 1/thin liquid diet will be resumed.  Recommend removal of NG tube as soon as feasible, due to potential adverse effect of NG tube on swallow function.  ST to follow for diet tolerance.  Communicative and cognitive status appear to be at pt's baseline.    Aspiration Risk  Moderate    Diet Recommendation Dysphagia 1 (Puree);Thin liquid   Liquid Administration via: Straw Medication Administration: Whole meds with puree Supervision: Full supervision/cueing for compensatory strategies;Trained caregiver to feed patient Compensations: Slow rate;Small sips/bites Postural Changes and/or Swallow Maneuvers: Upright 30-60 min after meal;Seated upright 90 degrees    Other  Recommendations Oral Care Recommendations: Oral care before and after PO Other Recommendations: Clarify dietary restrictions   Follow Up Recommendations  24 hour supervision/assistance    Frequency and Duration min 1 x/week  1 week   Pertinent Vitals/Pain VSS, no pain indicated    SLP Swallow Goals  see care plan   Swallow Study Prior Functional Status   Puree diet with thin liquids    General Date of Onset: 02/12/13 HPI: 78 year old female  admitted 02/12/13  due to hypotension, decreased responsiveness, and UTI.  PMH significant for breast neoplasm, vascular dementia, CKD, DM, HTN, sacral ulcers, PNA, CVA x3, hiatal hernia.  CXR revealed low volumes, vascular crowding vs vascular congestion.  Pt was seen for BSE last month, at which time Puree diet with thin liquids was recommended. Type of Study: Bedside swallow evaluation Previous Swallow Assessment: SLE 2013, BSE 01/2013 Diet Prior to this Study: NPO Temperature Spikes Noted: No Respiratory Status: Room air History of Recent  Intubation: No Behavior/Cognition: Alert;Cooperative;Doesn't follow directions Oral Cavity - Dentition: Adequate natural dentition;Missing dentition Self-Feeding Abilities: Total assist Patient Positioning: Upright in bed Baseline Vocal Quality: Aphonic Volitional Cough: Cognitively unable to elicit Volitional Swallow: Unable to elicit    Oral/Motor/Sensory Function Overall Oral Motor/Sensory Function: Appears within functional limits for tasks assessed   Ice Chips Ice chips: Not tested   Thin Liquid Thin Liquid: Within functional limits Presentation: Straw    Nectar Thick Nectar Thick Liquid: Not tested   Honey Thick Honey Thick Liquid: Not tested   Puree Puree: Within functional limits Presentation: Spoon   Solid   GO   [name] B. Murvin Natal [employer], CCC-SLP [phone]  Solid: Not tested       Leigh Aurora 02/15/2013,3:20 PM

## 2013-02-15 NOTE — Progress Notes (Signed)
Family Medicine Teaching Service Daily Progress Note Intern Pager: [phone]  Patient name: [name] Medical record number: [id number] Date of birth: 06-21-1934 Age: 78 y.o. Gender: female  Primary Care Provider: Tana Conch, MD Consultants: CCM Code Status: Partial for vasoactive drugs and antidysrhythmic drugs   Pt Overview and Major Events to Date:  02/12/13 - Admitted in septic shock, placed on pressors, started on levoquin, aztreonam 02/13/13 - started on Vanc PO/Flagyl IV for C. Diff, Vanc IV for staph bacteremia 02/14/13 - Off pressors  02/15/13 - Transitioned to Fam Med Service    Assessment and Plan: 78 year old F who presented in septic shock and placed on pressors found to have Staph bacteremia and C. Diff colitis transferred from ICU to [facility] for further care.   # Staph Bacteremia/Sepsis - Source unknown; TTE negative - Vanc IV started 12/1, awaiting speciation  - Consult cards and obtain TEE - Pt still on stress-dosed steroids, will need to taper  # C. Diff - Pt on Vanc PO and Flagyl IV started 12/1  # CV  - Hold HTN meds  # CVA, Hx - Permanent aphasia and dementia - hold aggrenox, lipitor, aricept while not taking PO  # Anemia, Chronic Normocytic - due to chronic disease - stable at hgb 7.2, cont to trend  # Sacral Ulcer - Stage 2, present on admission  - Cont collagenase cream and coverage daily   # FEN/GI:  - pt currently on tube feeds, will need to improve mental status prior to re-initiating swallow trial   # PPx:  - Stop lovenox and restart home Xarelto at 20 mg daily since creat clearance improved     Disposition: Admit to Family Med Inpatient Service on step down  Subjective: Non verbal, pt's nurse denies any problems overnight   Objective: Temp:  [98.1 F (36.7 C)-99.5 F (37.5 C)] 98.7 F (37.1 C) (12/03 0754) Pulse Rate:  [25-102] 89 (12/03 0800) Resp:  [16-23] 17 (12/03 0800) BP: (96-122)/(48-67) 118/67 mmHg (12/03 0800) SpO2:   [96 %-100 %] 100 % (12/03 0800) Weight:  [247 lb 12.8 oz (112.4 kg)] 247 lb 12.8 oz (112.4 kg) (12/02 1000)  Physical Exam: General: obese female, lying in bed, non distressed Cardiovascular: RRR, 3/6 systolic murmur Respiratory: CTA-B Abdomen: soft, NDNT Extremities: no swelling   Laboratory:  Recent Labs Lab 02/13/13 0433 02/14/13 0415 02/15/13 0500  WBC 26.9* 22.1* 14.6*  HGB 7.7* 7.1* 7.2*  HCT 23.3* 22.8* 23.1*  PLT 323 305 304    Recent Labs Lab 02/12/13 1046  02/13/13 0433 02/14/13 0415 02/15/13 0500  NA 137  < > 140 139 145  K 6.6*  < > 4.9 4.4 4.2  CL 105  < > 111 110 115*  CO2 14*  < > 18* 17* 19  BUN 72*  < > 74* 70* 70*  CREATININE 3.55*  < > 3.20* 2.71* 2.15*  CALCIUM 8.1*  < > 7.7* 7.5* 7.7*  PROT 6.3  --   --   --   --   BILITOT 0.4  --   --   --   --   ALKPHOS 189*  --   --   --   --   ALT 74*  --   --   --   --   AST 114*  --   --   --   --   GLUCOSE 137*  < > 142* 225* 185*  < > = values in this interval  not displayed.   Imaging/Diagnostic Tests: US Breast Right  02/13/2013   CLINICAL DATA:  Palpable mass right breast. Patient is septic, rule out abscess. Patient has a history of prior bilateral malignant lumpectomies an known large complex seroma at the right lumpectomy site.  EXAM: ULTRASOUND RIGHT BREAST  COMPARISON:  04/18/2012 and 01/29/2012  FINDINGS: Ultrasound is performed over patient's palpable abnormality at the 12 o'clock position right periareolar region, showing evidence of her known large complex fluid collection measuring approximately 5.2 x 6.0 x 6.3 cm and extending to the skin at the lumpectomy site. This is compatible with patient's known seroma and is not significantly changed from the prior exams.  IMPRESSION: 5.2 x 6.0 x 6.3 cm complex fluid collection over the 12 o'clock position of the right periareolar region without significant change from patient's prior exams compatible with a complex postoperative seroma at the lumpectomy  site. Cannot completely exclude superimposed infection of this known fluid collection.  RECOMMENDATION: Recommend followup as clinically indicated. Patient is due for bilateral screening annual mammographic followup category 2015.  I have discussed the findings and recommendations with the patient. Results were also provided in writing at the conclusion of the visit. If applicable, a reminder letter will be sent to the patient regarding the next appointment.  BI-RADS CATEGORY  Insert category 2   Electronically Signed   By: Elberta Fortis M.D.   On: 02/13/2013 15:52   Dg Abd Portable 1v  02/13/2013   CLINICAL DATA:  Nasogastric tube placement assessment.  EXAM: PORTABLE ABDOMEN - 1 VIEW  COMPARISON:  None.  FINDINGS: The nasogastric tube tip lies in the region of the gastric cardia but the proximal port is at the level of the GE junction. Advancement by at least 10 cm is recommended. The bowel gas pattern is nonspecific. There are likely phleboliths within the pelvis.  IMPRESSION: Advancement of the nasogastric tube by approximately 10 cm is recommended to assure that the proximal port lies below the GE junction.   Electronically Signed   By: David  [place]   On: 02/13/2013 16:22      Garnetta Buddy, MD 02/15/2013, 9:07 AM PGY-3, [facility] Family Medicine FPTS Intern pager: [phone], text pages welcome

## 2013-02-16 ENCOUNTER — Telehealth: Payer: Self-pay | Admitting: Family Medicine

## 2013-02-16 DIAGNOSIS — F329 Major depressive disorder, single episode, unspecified: Secondary | ICD-10-CM

## 2013-02-16 DIAGNOSIS — E785 Hyperlipidemia, unspecified: Secondary | ICD-10-CM

## 2013-02-16 DIAGNOSIS — I050 Rheumatic mitral stenosis: Secondary | ICD-10-CM

## 2013-02-16 DIAGNOSIS — E875 Hyperkalemia: Secondary | ICD-10-CM

## 2013-02-16 DIAGNOSIS — N182 Chronic kidney disease, stage 2 (mild): Secondary | ICD-10-CM

## 2013-02-16 DIAGNOSIS — I10 Essential (primary) hypertension: Secondary | ICD-10-CM

## 2013-02-16 DIAGNOSIS — F0150 Vascular dementia without behavioral disturbance: Secondary | ICD-10-CM

## 2013-02-16 LAB — GLUCOSE, CAPILLARY
Glucose-Capillary: 155 mg/dL — ABNORMAL HIGH (ref 70–99)
Glucose-Capillary: 156 mg/dL — ABNORMAL HIGH (ref 70–99)
Glucose-Capillary: 215 mg/dL — ABNORMAL HIGH (ref 70–99)
Glucose-Capillary: 248 mg/dL — ABNORMAL HIGH (ref 70–99)

## 2013-02-16 LAB — BASIC METABOLIC PANEL
CO2: 18 mEq/L — ABNORMAL LOW (ref 19–32)
Calcium: 7.9 mg/dL — ABNORMAL LOW (ref 8.4–10.5)
Creatinine, Ser: 1.66 mg/dL — ABNORMAL HIGH (ref 0.50–1.10)
GFR calc Af Amer: 33 mL/min — ABNORMAL LOW (ref >90)
Potassium: 3.3 mEq/L — ABNORMAL LOW (ref 3.5–5.1)
Sodium: 146 mEq/L — ABNORMAL HIGH (ref 135–145)

## 2013-02-16 MED ORDER — POTASSIUM CHLORIDE 10 MEQ/100ML IV SOLN
10.0000 meq | INTRAVENOUS | Status: AC
  Administered 2013-02-16 (×2): 10 meq via INTRAVENOUS
  Filled 2013-02-16 (×2): qty 100

## 2013-02-16 MED ORDER — INSULIN ASPART 100 UNIT/ML ~~LOC~~ SOLN
0.0000 [IU] | SUBCUTANEOUS | Status: DC
  Administered 2013-02-16: 1 [IU] via SUBCUTANEOUS
  Administered 2013-02-16: 2 [IU] via SUBCUTANEOUS
  Administered 2013-02-16: 5 [IU] via SUBCUTANEOUS
  Administered 2013-02-16: 3 [IU] via SUBCUTANEOUS
  Administered 2013-02-16: 1 [IU] via SUBCUTANEOUS
  Administered 2013-02-16 – 2013-02-17 (×3): 2 [IU] via SUBCUTANEOUS
  Administered 2013-02-17: 3 [IU] via SUBCUTANEOUS
  Administered 2013-02-17: 2 [IU] via SUBCUTANEOUS
  Administered 2013-02-17: 1 [IU] via SUBCUTANEOUS
  Administered 2013-02-18: 2 [IU] via SUBCUTANEOUS
  Administered 2013-02-18: 3 [IU] via SUBCUTANEOUS
  Administered 2013-02-18 (×2): 2 [IU] via SUBCUTANEOUS
  Administered 2013-02-18: 3 [IU] via SUBCUTANEOUS
  Administered 2013-02-18 – 2013-02-19 (×2): 1 [IU] via SUBCUTANEOUS
  Administered 2013-02-19: 5 [IU] via SUBCUTANEOUS
  Administered 2013-02-19: 2 [IU] via SUBCUTANEOUS
  Administered 2013-02-19: 5 [IU] via SUBCUTANEOUS
  Administered 2013-02-20: 17:00:00 3 [IU] via SUBCUTANEOUS
  Administered 2013-02-20: 05:00:00 2 [IU] via SUBCUTANEOUS
  Administered 2013-02-20: 5 [IU] via SUBCUTANEOUS
  Administered 2013-02-20 – 2013-02-21 (×3): 3 [IU] via SUBCUTANEOUS
  Administered 2013-02-21 (×2): 2 [IU] via SUBCUTANEOUS
  Administered 2013-02-21: 3 [IU] via SUBCUTANEOUS
  Administered 2013-02-21: 1 [IU] via SUBCUTANEOUS
  Administered 2013-02-21: 2 [IU] via SUBCUTANEOUS
  Administered 2013-02-22 (×2): 1 [IU] via SUBCUTANEOUS
  Administered 2013-02-22: 3 [IU] via SUBCUTANEOUS

## 2013-02-16 MED ORDER — INSULIN ASPART 100 UNIT/ML ~~LOC~~ SOLN: 0.0000 [IU] | Freq: Three times a day (TID) | SUBCUTANEOUS | Status: DC

## 2013-02-16 MED ORDER — INSULIN GLARGINE 100 UNIT/ML ~~LOC~~ SOLN
5.0000 [IU] | Freq: Every day | SUBCUTANEOUS | Status: DC
  Administered 2013-02-16 – 2013-02-21 (×6): 5 [IU] via SUBCUTANEOUS
  Filled 2013-02-16 (×7): qty 0.05

## 2013-02-16 MED ORDER — VANCOMYCIN HCL 10 G IV SOLR
1500.0000 mg | INTRAVENOUS | Status: DC
  Administered 2013-02-16 – 2013-02-19 (×3): 1500 mg via INTRAVENOUS
  Filled 2013-02-16 (×5): qty 1500

## 2013-02-16 MED ORDER — SODIUM CHLORIDE 0.9 % IV SOLN
INTRAVENOUS | Status: DC
  Administered 2013-02-17: 06:00:00 via INTRAVENOUS
  Administered 2013-02-17: 20 mL/h via INTRAVENOUS

## 2013-02-16 MED ORDER — PREDNISONE 50 MG PO TABS
60.0000 mg | ORAL_TABLET | Freq: Every day | ORAL | Status: DC
  Administered 2013-02-17 – 2013-02-19 (×3): 60 mg via ORAL
  Filled 2013-02-16 (×4): qty 1

## 2013-02-16 NOTE — Progress Notes (Signed)
Made contact with Pt sister Steffanie Dunn to attempt to obtain consent for Dr. Delton See to perform a TEE today.  I was unable to answer all of [name]'s questions which digressed into the overall plan of care at this time with other questions about the procedure.  I offered to have the Primary Doctor call her to answer her questions at this time and she agreed with this plan.  I made contact with the family medicine MD on call provided update and requested they follow up with Colvin Caroli.    Jacqulyn Cane RN, BSN, CCRN

## 2013-02-16 NOTE — Progress Notes (Signed)
PCP Social Visit Note [facility] Mika Anastasi O. Raissa Dam III, MD, PGY-3  I have seen patient in the hospital. Unfortunately, due to her dementia was unable to discuss current hospitalization. I agree with the excellent care provided by the primary team of [facility] Teaching Service and want to thank them for their continued efforts in caring for my patient.   Yeila Morro, MD, PGY-3 02/16/2013 2:09 PM       

## 2013-02-16 NOTE — Progress Notes (Signed)
Family Medicine Teaching Service Daily Progress Note Intern Pager: [phone]  Patient name: [name] Medical record number: [id number] Date of birth: Jul 05, 1934 Age: 78 y.o. Gender: female  Primary Care Provider: Tana Conch, MD Consultants: CCM Code Status: Partial for vasoactive drugs and antidysrhythmic drugs   Pt Overview and Major Events to Date:  02/12/13 - Admitted in septic shock, placed on pressors, started on levoquin, aztreonam 02/13/13 - started on Vanc PO/Flagyl IV for C. Diff, Vanc IV for staph bacteremia 02/14/13 - Off pressors  02/15/13 - Transitioned to Fam Med Service    Assessment and Plan: 78 year old F who presented in septic shock and placed on pressors found to have Staph bacteremia and C. Diff colitis transferred from ICU to [facility] for further care.   # Staph Bacteremia/Sepsis - Source unknown; TTE negative - Vanc IV started 12/1, coag neg staph X2 - NP for TEE this am looking for vegetations - Pt still on stress-dosed steroids, will need to taper  # C. Diff - Pt on Vanc PO and Flagyl IV started 12/1 -contact precautions -only one recorded stool yesterday  # CV  - Hold HTN meds  # CVA, Hx - Permanent aphasia and dementia - hold aggrenox, lipitor, aricept while not taking PO -SLP reviewed and pt cleared for dysphagia I diet, will restart after TEE  # Anemia, Chronic Normocytic - due to chronic disease - stable at hgb 7.2 yesterday, cont to trend  # Sacral Ulcer - Stage 2, present on admission  - Cont collagenase cream and coverage daily   FEN/GI: dysphagia diet after TEE today PPx: restarting xarelto today in light of improved CrCL  Disposition: pending results of TEE, narrowing of ABx regimen, transition to PO  Subjective: Non verbal, no acute issues that I am aware of per nurse report, sister wanting to be contacted this am re consent for TEE  Objective: Temp:  [97.5 F (36.4 C)-98.9 F (37.2 C)] 97.8 F (36.6 C) (12/04 0411) Pulse  Rate:  [71-104] 91 (12/04 0800) Resp:  [16-25] 19 (12/04 0800) BP: (130-165)/(48-94) 145/94 mmHg (12/04 0600) SpO2:  [94 %-100 %] 96 % (12/04 0800) Weight:  [241 lb 13.5 oz (109.7 kg)] 241 lb 13.5 oz (109.7 kg) (12/04 0500)  Physical Exam: General: obese female, lying in bed, non distressed Cardiovascular: RRR, 3/6 systolic blowing murmur Respiratory: CTA-B Abdomen: soft, NDNT Extremities: no swelling   Laboratory:  Recent Labs Lab 02/13/13 0433 02/14/13 0415 02/15/13 0500  WBC 26.9* 22.1* 14.6*  HGB 7.7* 7.1* 7.2*  HCT 23.3* 22.8* 23.1*  PLT 323 305 304    Recent Labs Lab 02/12/13 1046  02/14/13 0415 02/15/13 0500 02/16/13 0500  NA 137  < > 139 145 146*  K 6.6*  < > 4.4 4.2 3.3*  CL 105  < > 110 115* 116*  CO2 14*  < > 17* 19 18*  BUN 72*  < > 70* 70* 67*  CREATININE 3.55*  < > 2.71* 2.15* 1.66*  CALCIUM 8.1*  < > 7.5* 7.7* 7.9*  PROT 6.3  --   --   --   --   BILITOT 0.4  --   --   --   --   ALKPHOS 189*  --   --   --   --   ALT 74*  --   --   --   --   AST 114*  --   --   --   --   GLUCOSE  137*  < > 225* 185* 200*  < > = values in this interval not displayed.   Imaging/Diagnostic Tests: No results found.    Anselm Lis, MD 02/16/2013, 9:33 AM PGY-1, [facility] Family Medicine FPTS Intern pager: [phone], text pages welcome

## 2013-02-16 NOTE — Progress Notes (Signed)
FMTS Attending Daily Note:  Renold Don MD  [phone] pager  Family Practice pager:  [phone] I have seen and examined this patient and have reviewed their chart. I have discussed this patient with the resident. I agree with the resident's findings, assessment and care plan.  Additionally:  - TEE on Friday to determine length of therapy for oxacillin-resistant coag negative staph bacteremia based on presence of any vegetations.  - Otherwise close to baseline and can be DC'ed back to SNF once therapy decisions are made.   Tobey Grim, MD 02/16/2013 2:56 PM

## 2013-02-16 NOTE — Clinical Documentation Improvement (Signed)
THIS DOCUMENT IS NOT A PERMANENT PART OF THE MEDICAL RECORD  Please update your documentation with the medical record to reflect your response to this query. If you need help knowing how to do this please call [phone].  02/16/13   Dear Dr. Anselm Lis,   In a better effort to capture your patient's severity of illness, reflect appropriate length of stay and utilization of resources, a review of the patient medical record has revealed the following indicators.   In Dr. Julienne Kass Progress Note from 02/14/13, he documented "admitted from SNF with acute encephalopathy, hypotension and UTI."  He also documented, "Acute on chronic renal failure - improving.  Elevated transaminases, likely shocked liver.  Protein calorie malnutrition. Dysphagia."  Based on your clinical judgment, please clarify and document in a progress note and/or discharge summary the clinical condition associated with the following supporting information: Do you agree with Dr. Julienne Kass diagnoses of Encephalopathy, Acute on Chronic Renal Failure, Shock liver, and Protein Calorie Malnutrition?    In responding to this query please exercise your independent judgment.  The fact that a query is asked, does not imply that any particular answer is desired or expected.  Possible Clinical Conditions?  _____Encephalopathy _____Acute on Chronic Renal Failure _____Shock liver _____Protein Calorie Malnutrition _____Other Condition__________________ _____Cannot Clinically Determine    You may use possible, probable, or suspect with inpatient documentation.  Possible, probable, suspected diagnoses MUST be documented at the time of discharge.   Reviewed: additional documentation in the medical record  Thank You,  Darla Lesches, RN, BSN, CCRN Clinical Documentation Improvement Specialist HIM department--[place] Office [phone]

## 2013-02-16 NOTE — Progress Notes (Signed)
SLP Cancellation Note  Patient Details Name: PRESLY STEINRUCK MRN: [id number] DOB: 1934-08-12   Cancelled treatment:        Unable to assess diet tolerance at this time. Pt is currently NPO for TEE.  Will continue efforts. Jhoanna Heyde B. Murvin Natal [facility], CCC-SLP [phone]  Leigh Aurora 02/16/2013, 10:12 AM

## 2013-02-16 NOTE — Progress Notes (Signed)
ANTIBIOTIC CONSULT NOTE - FOLLOW UP  Pharmacy Consult:  Vancomycin Indication:  CoNS bacteremia  Allergies  Allergen Reactions  . Penicillins Other (See Comments)    Per MAR  . Tetanus Toxoids Other (See Comments)    Per MAR  . Vicodin [Hydrocodone-Acetaminophen] Other (See Comments)    Per [facility]    Patient Measurements: Height: 5' 6.14" (168 cm) Weight: 241 lb 13.5 oz (109.7 kg) IBW/kg (Calculated) : 59.63  Vital Signs: Temp: 97.8 F (36.6 C) (12/04 0411) Temp src: Oral (12/04 0411) BP: 145/94 mmHg (12/04 0600) Pulse Rate: 71 (12/04 0600) Intake/Output from previous day: 12/03 0701 - 12/04 0700 In: 2390 [I.V.:420; NG/GT:1170; IV Piggyback:800] Out: 2085 [Urine:2085]  Labs:  Recent Labs  02/14/13 0415 02/15/13 0500 02/16/13 0500  WBC 22.1* 14.6*  --   HGB 7.1* 7.2*  --   PLT 305 304  --   CREATININE 2.71* 2.15* 1.66*   Estimated Creatinine Clearance: 36.2 ml/min (by C-G formula based on Cr of 1.66). No results found for this basename: VANCOTROUGH, Leodis Binet, VANCORANDOM, GENTTROUGH, GENTPEAK, GENTRANDOM, TOBRATROUGH, TOBRAPEAK, TOBRARND, AMIKACINPEAK, AMIKACINTROU, AMIKACIN,  in the last 72 hours   Microbiology: Recent Results (from the past 720 hour(s))  CULTURE, BLOOD (ROUTINE X 2)     Status: None   Collection Time    02/12/13 12:20 PM      Result Value Range Status   Specimen Description BLOOD CENTRAL LINE   Final   Special Requests BOTTLES DRAWN AEROBIC AND ANAEROBIC 5CC EACH   Final   Culture  Setup Time     Final   Value: 02/12/2013 17:01     Performed at [facility]   Culture     Final   Value: STAPHYLOCOCCUS SPECIES (COAGULASE NEGATIVE)     Note: SUSCEPTIBILITIES PERFORMED ON PREVIOUS CULTURE WITHIN THE LAST 5 DAYS.     Note: Gram Stain Report Called to,Read Back By and Verified With: ABBY STOPHEL ON 02/13/2013 AT 10:36P BY WILEJ     Performed at [facility]   Report Status 02/15/2013 FINAL   Final  URINE CULTURE     Status: None    Collection Time    02/12/13 12:32 PM      Result Value Range Status   Specimen Description URINE, CATHETERIZED   Final   Special Requests NONE   Final   Culture  Setup Time     Final   Value: 02/12/2013 17:04     Performed at [facility] Count     Final   Value: >=100,000 COLONIES/ML     Performed at [facility]   Culture     Final   Value: Multiple bacterial morphotypes present, none predominant. Suggest appropriate recollection if clinically indicated.     Performed at [facility]   Report Status 02/14/2013 FINAL   Final  CULTURE, BLOOD (ROUTINE X 2)     Status: None   Collection Time    02/12/13  1:43 PM      Result Value Range Status   Specimen Description BLOOD LEFT ARM   Final   Special Requests BOTTLES DRAWN AEROBIC AND ANAEROBIC 10CC   Final   Culture  Setup Time     Final   Value: 02/12/2013 17:00     Performed at [facility]   Culture     Final   Value: STAPHYLOCOCCUS SPECIES (COAGULASE NEGATIVE)     Note: RIFAMPIN AND GENTAMICIN SHOULD NOT BE USED AS  SINGLE DRUGS FOR TREATMENT OF STAPH INFECTIONS. This organism is presumed to be Clindamycin resistant based on detection of inducible Clindamycin resistance.     Note: Gram Stain Report Called to,Read Back By and Verified With: CRYSTAL MANUS 02/13/13 1057 BY SMITHERJ     Performed at [facility]   Report Status 02/15/2013 FINAL   Final   Organism ID, Bacteria STAPHYLOCOCCUS SPECIES (COAGULASE NEGATIVE)   Final  MRSA PCR SCREENING     Status: None   Collection Time    02/12/13  3:34 PM      Result Value Range Status   MRSA by PCR NEGATIVE  NEGATIVE Final   Comment:            The GeneXpert MRSA Assay (FDA     approved for NASAL specimens     only), is one component of a     comprehensive MRSA colonization     surveillance program. It is not     intended to diagnose MRSA     infection nor to guide or     monitor treatment for     MRSA infections.  CLOSTRIDIUM  DIFFICILE BY PCR     Status: Abnormal   Collection Time    02/12/13  5:56 PM      Result Value Range Status   C difficile by pcr POSITIVE (*) NEGATIVE Final   Comment: CRITICAL RESULT CALLED TO, READ BACK BY AND VERIFIED WITH:     Sherlyn Lick RN 10:20 02/13/13 ([email or web address])      Assessment: 29 YOF with CoNS bacteremia to continue on IV vancomycin.  Patient is also on PO vancomycin and IV Flagyl for positive C.diff.  Her renal function is improving.  11/30 Azactam >> 12/2 11/30 Levaquin >> 12/1 Vanc 12/1 >> Vanc PO 12/1 >> Flagyl IV 12/1 >>  11/30 Urine - negative 11/30 MRSA PCR - negative 11/30 C.diff PCR - positive 11/30 blood cx x2- CoNS (sensitive Vanc, Septra, gent)   Goal of Therapy:  Vancomycin trough level 15-20 mcg/ml   Plan:  - Change Vanc to 1500mg  IV Q24H - Xarelto 20mg  PO daily with supper as ordered - F/U renal fxn, micro data, clinical course, vanc trough as indicated - F/U CBGs and insulin adjustments - F/U PO intake to resume home meds - F/U KCL supplementation    Hudson Majkowski D. Laney Potash, PharmD, BCPS Pager:  [phone] 02/16/2013, 7:43 AM

## 2013-02-16 NOTE — Progress Notes (Signed)
Family Practice Teaching Service Interval Progress Note  S: Routine overnight evaluation of patient. No acute concerns at this time. Discussed recent course with pt's nurse, noted last CBG elevated at 248, and current ICU order protocol called for insulin drip, which is not indicated. Currently NPO d/t upcoming TEE in AM (02/16/13). Requested alternative insulin coverage orders.   O: BP 145/87  Pulse 91  Temp(Src) 97.5 F (36.4 C) (Oral)  Resp 19  Ht 5' 6.14" (1.68 m)  Wt 241 lb 13.5 oz (109.7 kg)  BMI 38.87 kg/m2  SpO2 100% Gen - obese, laying in bed, non-verbal at baseline, NAD HEENT - NGT in place (feeds held, NPO), tracking eye movements Heart - RRR, 3/6 SEM heard throughout Lungs - CTAB Abd - soft, NTND  A/P: 78 y.o F with MRSA bacteremia (resolved septic shock, currently off pressors), C. Diff colitis. Patient remains in ICU, but is SDU status. Currently stable. - No change to current plan - MRSA sensitive to Vanc, continue current therapy - Vanc PO, Flagyl IV for C. Diff. - remain NPO until TEE performed in AM - SLP recommend dysphagia 1 diet and thin liquid, trial off of NGT following resuming diet  Saralyn Pilar, DO Family Medicine PGY-1 Service Pager [phone]

## 2013-02-16 NOTE — Telephone Encounter (Signed)
Called patient's sister Colvin Caroli to update on Ms. Nabozny's situation. She is agreeable to TEE and would like it to be tomorrow. I will try to arrange that. Otherwise, all questions answered.

## 2013-02-16 NOTE — Progress Notes (Signed)
Inpatient Diabetes Program Recommendations  AACE/ADA: New Consensus Statement on Inpatient Glycemic Control (2013)  Target Ranges:  Prepandial:   less than 140 mg/dL      Peak postprandial:   less than 180 mg/dL (1-2 hours)      Critically ill patients:  140 - 180 mg/dL   Reason for Visit: Results for [name], [name] (MRN [id number]) as of 02/16/2013 11:37  Ref. Range 02/15/2013 20:19 02/16/2013 00:08 02/16/2013 04:10 02/16/2013 05:00 02/16/2013 08:02  Glucose-Capillary Latest Range: 70-99 mg/dL 811 (H) 914 (H) 782 (H)  156 (H)   Note CBG's trending up.  Consider adding Lantus 10 units daily as basal insulin coverage.  Thanks, Beryl Meager, RN, BC-ADM Inpatient Diabetes Coordinator Pager [phone]

## 2013-02-17 ENCOUNTER — Encounter (HOSPITAL_COMMUNITY)
Admission: EM | Disposition: A | Discharge status: Skilled Nursing Facility | Payer: Self-pay | Source: Home / Self Care | Attending: Family Medicine

## 2013-02-17 ENCOUNTER — Encounter (HOSPITAL_COMMUNITY): Payer: Self-pay

## 2013-02-17 DIAGNOSIS — I359 Nonrheumatic aortic valve disorder, unspecified: Secondary | ICD-10-CM

## 2013-02-17 DIAGNOSIS — H409 Unspecified glaucoma: Secondary | ICD-10-CM

## 2013-02-17 DIAGNOSIS — J309 Allergic rhinitis, unspecified: Secondary | ICD-10-CM

## 2013-02-17 DIAGNOSIS — L8990 Pressure ulcer of unspecified site, unspecified stage: Secondary | ICD-10-CM

## 2013-02-17 DIAGNOSIS — L89109 Pressure ulcer of unspecified part of back, unspecified stage: Secondary | ICD-10-CM

## 2013-02-17 DIAGNOSIS — I059 Rheumatic mitral valve disease, unspecified: Secondary | ICD-10-CM

## 2013-02-17 HISTORY — PX: TEE WITHOUT CARDIOVERSION: SHX5443

## 2013-02-17 LAB — GLUCOSE, CAPILLARY
Glucose-Capillary: 113 mg/dL — ABNORMAL HIGH (ref 70–99)
Glucose-Capillary: 124 mg/dL — ABNORMAL HIGH (ref 70–99)
Glucose-Capillary: 151 mg/dL — ABNORMAL HIGH (ref 70–99)
Glucose-Capillary: 164 mg/dL — ABNORMAL HIGH (ref 70–99)
Glucose-Capillary: 181 mg/dL — ABNORMAL HIGH (ref 70–99)
Glucose-Capillary: 219 mg/dL — ABNORMAL HIGH (ref 70–99)

## 2013-02-17 LAB — CBC
HCT: 24.5 % — ABNORMAL LOW (ref 36.0–46.0)
Hemoglobin: 7.6 g/dL — ABNORMAL LOW (ref 12.0–15.0)
MCH: 25.6 pg — ABNORMAL LOW (ref 26.0–34.0)
MCHC: 31 g/dL (ref 30.0–36.0)
MCV: 82.5 fL (ref 78.0–100.0)
Platelets: 365 10*3/uL (ref 150–400)
RBC: 2.97 MIL/uL — ABNORMAL LOW (ref 3.87–5.11)
RDW: 17.8 % — ABNORMAL HIGH (ref 11.5–15.5)
WBC: 15.6 10*3/uL — ABNORMAL HIGH (ref 4.0–10.5)

## 2013-02-17 LAB — BASIC METABOLIC PANEL
BUN: 61 mg/dL — ABNORMAL HIGH (ref 6–23)
Chloride: 117 mEq/L — ABNORMAL HIGH (ref 96–112)
Creatinine, Ser: 1.38 mg/dL — ABNORMAL HIGH (ref 0.50–1.10)
Glucose, Bld: 131 mg/dL — ABNORMAL HIGH (ref 70–99)
Potassium: 3.1 mEq/L — ABNORMAL LOW (ref 3.5–5.1)

## 2013-02-17 SURGERY — ECHOCARDIOGRAM, TRANSESOPHAGEAL
Anesthesia: Moderate Sedation

## 2013-02-17 MED ORDER — FENTANYL CITRATE 0.05 MG/ML IJ SOLN
INTRAMUSCULAR | Status: AC
  Filled 2013-02-17: qty 2

## 2013-02-17 MED ORDER — PRO-STAT SUGAR FREE PO LIQD
30.0000 mL | Freq: Two times a day (BID) | ORAL | Status: DC
  Administered 2013-02-17 – 2013-02-22 (×11): 30 mL via ORAL
  Filled 2013-02-17 (×13): qty 30

## 2013-02-17 MED ORDER — MIDAZOLAM HCL 10 MG/2ML IJ SOLN
INTRAMUSCULAR | Status: DC | PRN
  Administered 2013-02-17 (×3): 1 mg via INTRAVENOUS

## 2013-02-17 MED ORDER — CLONIDINE HCL 0.1 MG/24HR TD PTWK
0.1000 mg | MEDICATED_PATCH | TRANSDERMAL | Status: DC
  Administered 2013-02-17: 0.1 mg via TRANSDERMAL
  Filled 2013-02-17: qty 1

## 2013-02-17 MED ORDER — RIVAROXABAN 20 MG PO TABS: 20.0000 mg | ORAL_TABLET | Freq: Every day | ORAL | Status: DC

## 2013-02-17 MED ORDER — ASPIRIN-DIPYRIDAMOLE ER 25-200 MG PO CP12
1.0000 | ORAL_CAPSULE | Freq: Two times a day (BID) | ORAL | Status: DC
  Administered 2013-02-17 – 2013-02-22 (×10): 1 via ORAL
  Filled 2013-02-17 (×12): qty 1

## 2013-02-17 MED ORDER — FENTANYL CITRATE 0.05 MG/ML IJ SOLN
INTRAMUSCULAR | Status: DC | PRN
  Administered 2013-02-17 (×2): 25 ug via INTRAVENOUS

## 2013-02-17 MED ORDER — DONEPEZIL HCL 10 MG PO TABS
10.0000 mg | ORAL_TABLET | Freq: Every day | ORAL | Status: DC
  Administered 2013-02-17 – 2013-02-21 (×5): 10 mg via ORAL
  Filled 2013-02-17 (×7): qty 1

## 2013-02-17 MED ORDER — MIDAZOLAM HCL 5 MG/ML IJ SOLN
INTRAMUSCULAR | Status: AC
  Filled 2013-02-17: qty 2

## 2013-02-17 MED ORDER — CALCIUM CITRATE 950 (200 CA) MG PO TABS
200.0000 mg | ORAL_TABLET | Freq: Every day | ORAL | Status: DC
  Administered 2013-02-17 – 2013-02-22 (×6): 200 mg via ORAL
  Filled 2013-02-17 (×6): qty 1

## 2013-02-17 MED ORDER — BUTAMBEN-TETRACAINE-BENZOCAINE 2-2-14 % EX AERO
INHALATION_SPRAY | CUTANEOUS | Status: DC | PRN
  Administered 2013-02-17: 2 via TOPICAL

## 2013-02-17 MED ORDER — LIDOCAINE VISCOUS 2 % MT SOLN
OROMUCOSAL | Status: AC
  Filled 2013-02-17: qty 15

## 2013-02-17 MED ORDER — FLUTICASONE PROPIONATE 50 MCG/ACT NA SUSP
1.0000 | Freq: Every day | NASAL | Status: DC
  Administered 2013-02-17 – 2013-02-22 (×6): 1 via NASAL
  Filled 2013-02-17: qty 16

## 2013-02-17 MED ORDER — COLLAGENASE 250 UNIT/GM EX OINT: 1.0000 "application " | TOPICAL_OINTMENT | Freq: Every day | CUTANEOUS | Status: DC

## 2013-02-17 NOTE — H&P (View-Only) (Signed)
PCP Social Visit Note [name] Aldine Contes. Marti Sleigh, MD, PGY-3  I have seen patient in the hospital. Unfortunately, due to her dementia was unable to discuss current hospitalization. I agree with the excellent care provided by the primary team of [name] Teaching Service and want to thank them for their continued efforts in caring for my patient.   Tana Conch, MD, PGY-3 02/16/2013 2:09 PM

## 2013-02-17 NOTE — Interval H&P Note (Signed)
History and Physical Interval Note:  02/17/2013 10:29 AM  [name]  has presented today for surgery, with the diagnosis of Bacteremia [790.7]  The various methods of treatment have been discussed with the patient and family. After consideration of risks, benefits and other options for treatment, the patient has consented to  Procedure(s): TRANSESOPHAGEAL ECHOCARDIOGRAM (TEE) (N/A) as a surgical intervention .  The patient's history has been reviewed, patient examined, no change in status, stable for surgery.  I have reviewed the patient's chart and labs.  Questions were answered to the patient's satisfaction.     Tobias Alexander, H

## 2013-02-17 NOTE — Progress Notes (Signed)
Clinical Social Work Department CLINICAL SOCIAL WORK PLACEMENT NOTE 02/17/2013  Patient:  IOLANDA, FOLSON  Account Number:  [id number] Admit date:  02/12/2013  Clinical Social Worker:  Maryclare Labrador, Theresia Majors  Date/time:  02/17/2013 10:46 AM  Clinical Social Work is seeking post-discharge placement for this patient at the following level of care:   SKILLED NURSING   (*CSW will update this form in Epic as items are completed)   N/A-pt's sister prefered CSW send clinicals to all facilities then update her with offers. No list provided.  Patient/family provided with [name] Department of Clinical Social Work's list of facilities offering this level of care within the geographic area requested by the patient (or if unable, by the patient's family).  02/17/2013  Patient/family informed of their freedom to choose among providers that offer the needed level of care, that participate in Medicare, Medicaid or managed care program needed by the patient, have an available bed and are willing to accept the patient.  N/A - not submitted to facility  Patient/family informed of [facility]' ownership interest in [facility], as well as of the fact that they are under no obligation to receive care at this facility.  PASARR submitted to EDS on EXISTING PASARR number received from EDS on EXISTING  FL2 transmitted to all facilities in geographic area requested by pt/family on  02/17/2013 FL2 transmitted to all facilities within larger geographic area on   Patient informed that his/her managed care company has contracts with or will negotiate with  certain facilities, including the following:     Patient/family informed of bed offers received:   Patient chooses bed at  Physician recommends and patient chooses bed at    Patient to be transferred to  on   Patient to be transferred to facility by   The following physician request were entered in Epic:   Additional Comments:   Maryclare Labrador, MSW, [facility] Clinical Social Worker [phone]

## 2013-02-17 NOTE — Progress Notes (Signed)
Echocardiogram Echocardiogram Transesophageal has been performed.  [name] 02/17/2013, 12:59 PM

## 2013-02-17 NOTE — CV Procedure (Signed)
     Transesophageal Echocardiogram Note  [name] [id number] 1934/04/28  Procedure: Transesophageal Echocardiogram Indications: Bacteremia  Procedure Details Consent: Obtained Time Out: Verified patient identification, verified procedure, site/side was marked, verified correct patient position, special equipment/implants available, Radiology Safety Procedures followed,  medications/allergies/relevent history reviewed, required imaging and test results available.  Performed  Medications: Fentanyl: 50 mcg Versed: 3 mg  Left Ventrical:  The cavity size was normal. Wall thickness was increased in a pattern of moderate LVH. The estimated ejection fraction was in the range of 55% to 60%. Wall motion was normal; there were no regional wall motion abnormalities.   Right ventricle: Normal size and function.   Mitral Valve:  Severely calcified annulus and subvalvular apparatus.  Mildly thickened leaflets. Moderate mitral stenosis. Mild to moderate regurgitation.  No vegetation.  Aortic Valve: Trileaflet; severely calcified leaflets with restricted opening. Transaortic gradient not measured. Calcified annulus and subvalvular apparatus.  Mild regurgitation. No vegetation.   Tricuspid Valve: Normal, mild TR, no vegetation  Pulmonic Valve: Normal, no vegetation.  Left Atrium/ Left atrial appendage: No thrombus. Dilated left atrium.  Atrial septum: Positive PFO with agitated saline study.  Right atrium: The atrium was normal in size.  Aorta: Minimal atherosclerotic plague in the ascending and descending aorta and in the aortic arch.  Pulmonary artery: Normal size.  Pericardium: There was no pericardial effusion.  Complications: No apparent complications Patient did tolerate procedure well.  Lars Masson, MD, [facility] 02/17/2013, 1:52 PM

## 2013-02-17 NOTE — Progress Notes (Signed)
This CSW provided a handoff from pt's previous CSW. This CSW called [facility] and verified pt can return. CSW called pt's sister [name], who requested a SNF search because she believes her sister could get better care at another facility. CSW has submitted clinicals to other facilities and will complete a placement note. CSW to contact [facility] with any additional bed offers--[name] states pt can return to [facility] if pt does not get a bed at a different facility that [facility] would prefer.    Maryclare Labrador, MSW, [facility] Clinical Social Worker [phone]

## 2013-02-17 NOTE — Progress Notes (Signed)
FMTS Attending Daily Note:  Renold Don MD  [phone] pager  Family Practice pager:  [phone] I have seen and examined this patient and have reviewed their chart. I have discussed this patient with the resident. I agree with the resident's findings, assessment and care plan.  - Awaiting TEE to determine route and length of treatment.  Otherwise she is at baseline. - Need negative blood cx's before DC back to SNF    Tobey Grim, MD 02/17/2013

## 2013-02-17 NOTE — Progress Notes (Signed)
Family Medicine Teaching Service Daily Progress Note Intern Pager: [phone]  Patient name: [name] Medical record number: [id number] Date of birth: [birth year] Age: 78 y.o. Gender: female  Primary Care Provider: Tana Conch, MD Consultants: CCM Code Status: Partial for vasoactive drugs and antidysrhythmic drugs   Pt Overview and Major Events to Date:  02/12/13 - Admitted in septic shock, placed on pressors, started on levoquin, aztreonam 02/13/13 - started on Vanc PO/Flagyl IV for C. Diff, Vanc IV for staph bacteremia 02/14/13 - Off pressors  02/15/13 - Transitioned to Fam Med Service    Assessment and Plan: 78 year old F who presented in septic shock and placed on pressors found to have Staph bacteremia and C. Diff colitis transferred from ICU to [facility] for further care.   # Staph Bacteremia/Sepsis - Source unknown; TTE negative; Curbsided ID yesterday recommend 6 wks abx if vegetation seen and formal consult and ct surg involvement, otherwise need to demonstrate neg blood cultures X2 and then can go on 2 weeks of bactrim. Afebrile, white count bumped this am - Vanc IV started 12/1, coag neg staph X2 - NPO for TEE this am looking for vegetations - switched to 60mg  prednisone yesterday (ICU pharmacist felt that there was not a specific conversion regimen and did not actually require taper with less than 1 wk duration of stress dose steroids) -2nd set of blood cultures pending  # C. Diff- 6 stools recorded yesterday - Pt on Vanc PO and Flagyl IV started 12/1 -contact precautions  # CV- systolics 140s, LLE>RLE and pt unable to provide hx of pain or discomfort  - Hold HTN meds -venous duplex  # CVA, Hx - Permanent aphasia and dementia - hold aggrenox, lipitor, aricept while not taking PO -SLP reviewed and pt cleared for dysphagia I diet, will restart after TEE  # Anemia, Chronic Normocytic - due to chronic disease - stable at hgb 7.6 today, cont to trend  # Sacral Ulcer -  Stage 2, present on admission  - Cont collagenase cream and coverage daily   FEN/GI: dysphagia diet after TEE today PPx: cont xarelto in light of improved CrCL  Disposition: pending results of TEE, narrowing of ABx regimen, transition to PO, SW for [facility] to [facility]  Subjective: Aphasic, no acute events per nursing, pt appearing comfortable, noted continued stools  Objective: Temp:  [97.3 F (36.3 C)-98.6 F (37 C)] 98.5 F (36.9 C) (12/05 0740) Pulse Rate:  [25-105] 79 (12/05 0740) Resp:  [12-21] 12 (12/05 0740) BP: (123-165)/(59-102) 141/90 mmHg (12/05 0740) SpO2:  [92 %-100 %] 99 % (12/05 0740) Weight:  [246 lb 11.1 oz (111.9 kg)] 246 lb 11.1 oz (111.9 kg) (12/05 0443)  Physical Exam: General: obese female, lying in bed, non distressed, aphasic Cardiovascular: RRR, 3/6 systolic blowing murmur Respiratory: CTA-B, no increased WOB Abdomen: soft, NDNT Extremities: 1+ edema to mid shin bilat  Laboratory:  Recent Labs Lab 02/14/13 0415 02/15/13 0500 02/17/13 0443  WBC 22.1* 14.6* 15.6*  HGB 7.1* 7.2* 7.6*  HCT 22.8* 23.1* 24.5*  PLT 305 304 365    Recent Labs Lab 02/12/13 1046  02/14/13 0415 02/15/13 0500 02/16/13 0500  NA 137  < > 139 145 146*  K 6.6*  < > 4.4 4.2 3.3*  CL 105  < > 110 115* 116*  CO2 14*  < > 17* 19 18*  BUN 72*  < > 70* 70* 67*  CREATININE 3.55*  < > 2.71* 2.15* 1.66*  CALCIUM 8.1*  < >  7.5* 7.7* 7.9*  PROT 6.3  --   --   --   --   BILITOT 0.4  --   --   --   --   ALKPHOS 189*  --   --   --   --   ALT 74*  --   --   --   --   AST 114*  --   --   --   --   GLUCOSE 137*  < > 225* 185* 200*  < > = values in this interval not displayed.   Imaging/Diagnostic Tests: No results found.    Anselm Lis, MD 02/17/2013, 7:50 AM PGY-1, [facility] Family Medicine FPTS Intern pager: [phone], text pages welcome

## 2013-02-17 NOTE — Progress Notes (Signed)
Palliative Medicine Team consult for goals of care received, patient seen at bedside no family present; contacted patient's sister Colvin Caroli [phone]; she stated she is the primary decision maker for patient however she too has health issues - she requests meeting time when her daughter can be present - meeting time confirmed for Sunday 02/19/13 @ 1:30 pm    Valente David, RN 02/17/2013, 4:00 PM Palliative Medicine Team RN Liaison [phone]

## 2013-02-18 DIAGNOSIS — E870 Hyperosmolality and hypernatremia: Secondary | ICD-10-CM

## 2013-02-18 DIAGNOSIS — R609 Edema, unspecified: Secondary | ICD-10-CM

## 2013-02-18 DIAGNOSIS — E669 Obesity, unspecified: Secondary | ICD-10-CM

## 2013-02-18 LAB — GLUCOSE, CAPILLARY
Glucose-Capillary: 136 mg/dL — ABNORMAL HIGH (ref 70–99)
Glucose-Capillary: 109 mg/dL — ABNORMAL HIGH (ref 70–99)
Glucose-Capillary: 205 mg/dL — ABNORMAL HIGH (ref 70–99)

## 2013-02-18 LAB — CBC
HCT: 25 % — ABNORMAL LOW (ref 36.0–46.0)
Hemoglobin: 7.7 g/dL — ABNORMAL LOW (ref 12.0–15.0)
MCHC: 30.8 g/dL (ref 30.0–36.0)
RBC: 2.97 MIL/uL — ABNORMAL LOW (ref 3.87–5.11)

## 2013-02-18 LAB — BASIC METABOLIC PANEL
BUN: 56 mg/dL — ABNORMAL HIGH (ref 6–23)
Chloride: 119 mEq/L — ABNORMAL HIGH (ref 96–112)
GFR calc Af Amer: 50 mL/min — ABNORMAL LOW (ref >90)
GFR calc non Af Amer: 43 mL/min — ABNORMAL LOW (ref >90)
Glucose, Bld: 138 mg/dL — ABNORMAL HIGH (ref 70–99)
Potassium: 3.2 mEq/L — ABNORMAL LOW (ref 3.5–5.1)
Sodium: 151 mEq/L — ABNORMAL HIGH (ref 135–145)

## 2013-02-18 MED ORDER — ATORVASTATIN CALCIUM 20 MG PO TABS
20.0000 mg | ORAL_TABLET | Freq: Every day | ORAL | Status: DC
  Administered 2013-02-18 – 2013-02-21 (×4): 20 mg via ORAL
  Filled 2013-02-18 (×5): qty 1

## 2013-02-18 MED ORDER — POTASSIUM CHLORIDE CRYS ER 20 MEQ PO TBCR
40.0000 meq | EXTENDED_RELEASE_TABLET | Freq: Once | ORAL | Status: AC
  Administered 2013-02-18: 40 meq via ORAL
  Filled 2013-02-18: qty 2

## 2013-02-18 NOTE — Progress Notes (Signed)
*  PRELIMINARY RESULTS* Vascular Ultrasound Left lower extremity venous duplex has been completed.  Preliminary findings: no obvious evidence of DVT in visualized veins.    Farrel Demark, RDMS, RVT  02/18/2013, 3:37 PM

## 2013-02-18 NOTE — Progress Notes (Signed)
Family Medicine Teaching Service Daily Progress Note Intern Pager: [phone]  Patient name: [name] Medical record number: [id number] Date of birth: 10-03-1934 Age: 78 y.o. Gender: female  Primary Care Provider: Tana Conch, MD Consultants: CCM Code Status: Partial for vasoactive drugs and antidysrhythmic drugs   Pt Overview and Major Events to Date:  02/12/13 - Admitted in septic shock, placed on pressors, started on levoquin, aztreonam 02/13/13 - started on Vanc PO/Flagyl IV for C. Diff, Vanc IV for staph bacteremia 02/14/13 - Off pressors  02/15/13 - Transitioned to Fam Med Service  02/17/13 - TEE without vegetations   Assessment and Plan: 78 year old F who presented in septic shock and placed on pressors found to have Staph bacteremia and C. Diff colitis transferred from ICU to [facility] Med for further care.   # Staph Bacteremia/Sepsis - Source unknown; TTE negative; TEE negative. Once BCx are negative x48 hours can transition to bactrim for 2 weeks. - Vanc IV started 12/1, coag neg staph X2 - resume dysphagia 1 diet - switched to 60mg  prednisone yesterday (ICU pharmacist felt that there was not a specific conversion regimen and did not actually require taper with less than 1 wk duration of stress dose steroids) - will start to taper prior to discharge -2nd set of blood cultures pending  # C. Diff- improved stooling, one reported by nursing - Pt on Vanc PO and Flagyl IV started 12/1 - will need 14 day course of treatment - contact precautions  # Hypernatremia: sodium slowly increasing to 151 today. Likely related to poor PO intake. -continue to trend BMET -diet order placed and will encourage nursing to feed the patient  # Hypokalemia: 3.2 today -will replete with PO KDur 40 mEqx1  # CV- systolics 140s, LLE>RLE in size and pt unable to provide hx of pain or discomfort - Hold HTN meds - venous duplex not completed yet  # CVA, Hx, dementia - Permanent aphasia and  dementia - will reorder aggrenox, lipitor, aricept since taking PO now -SLP reviewed and pt cleared for dysphagia I diet, will restart today - palliative care meeting tomorrow at 1:30 pm  # Anemia, Chronic Normocytic - due to chronic disease - stable at hgb 7.7 today, cont to trend  # Sacral Ulcer - Stage 2, present on admission  - Cont collagenase cream and coverage daily   FEN/GI: dysphagia 1 diet, encourage to eat given hypernatremia PPx: cont xarelto in light of improved CrCL  Disposition: pending negative blood cultures and paliative meeting tomorrow, likely returning to [facility]  Subjective: Aphasic, no acute events per nursing, pt appearing comfortable, note decreased stools, nursing states appears hungry  Objective: Temp:  [97.4 F (36.3 C)-98.3 F (36.8 C)] 97.8 F (36.6 C) (12/06 0800) Pulse Rate:  [72-106] 93 (12/06 0800) Resp:  [8-18] 18 (12/05 1639) BP: (124-162)/(59-122) 137/69 mmHg (12/06 0800) SpO2:  [94 %-100 %] 97 % (12/06 0800) Weight:  [240 lb 11.9 oz (109.2 kg)] 240 lb 11.9 oz (109.2 kg) (12/06 0414)  Physical Exam: General: obese female, lying in bed, non distressed, aphasic Cardiovascular: RRR, 3/6 systolic blowing murmur Respiratory: CTAB, no increased WOB Abdomen: soft, NDNT Extremities: 1+ edema to just below the left knee, no edema noted RLE  Laboratory:  Recent Labs Lab 02/15/13 0500 02/17/13 0443 02/18/13 0500  WBC 14.6* 15.6* 13.8*  HGB 7.2* 7.6* 7.7*  HCT 23.1* 24.5* 25.0*  PLT 304 365 335    Recent Labs Lab 02/12/13 1046  02/16/13 0500 02/17/13 0800  02/18/13 0500  NA 137  < > 146* 147* 151*  K 6.6*  < > 3.3* 3.1* 3.2*  CL 105  < > 116* 117* 119*  CO2 14*  < > 18* 19 20  BUN 72*  < > 67* 61* 56*  CREATININE 3.55*  < > 1.66* 1.38* 1.19*  CALCIUM 8.1*  < > 7.9* 8.2* 8.2*  PROT 6.3  --   --   --   --   BILITOT 0.4  --   --   --   --   ALKPHOS 189*  --   --   --   --   ALT 74*  --   --   --   --   AST 114*  --   --   --    --   GLUCOSE 137*  < > 200* 131* 138*  < > = values in this interval not displayed.   Imaging/Diagnostic Tests: No results found.    Glori Luis, MD 02/18/2013, 11:00 AM PGY-2, [facility] Family Medicine FPTS Intern pager: [phone], text pages welcome

## 2013-02-18 NOTE — Progress Notes (Signed)
Notified MD. Of blood in urine. No new orders at this time. Will continue to monitor.

## 2013-02-18 NOTE — Progress Notes (Addendum)
FMTS Attending Daily Note:  Renold Don MD  [phone] pager  Family Practice pager:  [phone] I have seen and examined this patient and have reviewed their chart. I have discussed this patient with the resident. I agree with the resident's findings, assessment and care plan.  Additionally: Hypernatremic today.  Evidently no diet written for yesterday.  Encourage PO intake today, can start D5W if sodium doesn't correct with food/liquid intake.  TEE negative. Awaiting blood cultures to return negative Family meeting for GOC with Palliative Team tomorrow.  Greatly appreciate their recommendations.   Tobey Grim, MD 02/18/2013 11:49 AM

## 2013-02-18 NOTE — Discharge Summary (Signed)
Family Medicine Teaching [facility] Discharge Summary  Patient name: [name] Medical record number: [id number] Date of birth: 22-Oct-1934 Age: 78 y.o. Gender: female Date of Admission: 02/12/2013  Date of Discharge: 02/22/13 Admitting Physician: Alyson Reedy, MD  Primary Care Provider: Tana Conch, MD Consultants: PCCM  Indication for Hospitalization: sepsis  Discharge Diagnoses/Problem List:  -staph bacteremia -cdiff -protein calorie malnutrition -dementia -cva -anemia -htn -sepsis -hld -ckd stage 2  Disposition: [facility] to [facility]  Discharge Condition: stable  Discharge Exam:  BP 128/75  Pulse 78  Temp(Src) 98.5 F (36.9 C) (Axillary)  Resp 22  Ht 5' 6.14" (1.68 m)  Wt 251 lb 5.2 oz (114 kg)  BMI 40.39 kg/m2  SpO2 97% General: obese female, sleeping but awakens to name, lying in bed, non distressed, aphasic,  Cardiovascular: RRR, 3/6 systolic blowing murmur  Respiratory: CTAB anteriorly, no increased WOB  Abdomen: soft, ND NT  Extremities: 2+ edema to just below the left knee, trace edema noted RLE   Brief Hospital Course:  78 year old F who presented in septic shock and placed on pressors found to have Staph bacteremia and C. Diff colitis transferred from ICU to [facility] Med for further care.   # ID- Staph Bacteremia/Sepsis - Pt [facility] 2/2 hypotension and decreased alertness with malodorous urine found to be in septic shock. Pt was admitted to the ICU for pressor support and close monitoring of hemodynamic stability. Bcx were drawn and she was started on levaquin and aztreonam for coverage. WBC 33.7. U/A also remarkable for UTI. DNI status was discussed with family in anticipation of acute decompensation and decision was for no intubation. Pt remained able to support airway on own. Pt noted to have hyperkalemia, metabolic acidosis and acute on chronic renal failure received IVF, insulin glucose and calcium gluconate and kayexalate. HD  was considered initially but BP too hypotensive for pt to tolerate. TTE was obtained to r/o possible endocarditis, was negative for vegetations. Found to be Cdiff post and Bcx returned demonstrating 2 out of 2 coag neg staph bacteremia. Pt was started on vanc po/flagyl IV and levaquin/aztreonam transitioned to vanc IV for staph bacteremia. Pressors were successfully d/c 12/2 and pt was transitioned to [facility] TS. Given unknown source of sepsis TEE was obtained. Also without evidence of vegetations. ID was informally consulted to discuss antibiotic coverage in light of neg TEE recommended pt clear BCx X2 and then out be transitioned to 2 weeks of bactrim. Stress dose steroids were also changed to PO with intention to taper. Palliative meeting was held with family to discuss GOC. Decision made to maximize comfort care, avoid tube feeds and continue with treating reversible conditions. Pt d/c'd to complete 14 day course of bactrim.  # Neuro- Dementia/hx of CVA/Encephalopathy- permanent aphasia and dementia at baseline. Pt initially encephalopathic during septic period but appeared closer to baseline with continued treatment infectious processes. Was restarted on aggrenox, lipitor and aricept once SLP reviewed and cleared pt for dysphagia I diet. Decision made to continue careful hand feeding, DNR and comfort care.  #GI- Protein calorie malnutrition/cdiff- Pt with chronic malnutrition, followed by RD's during previous hospitalizations. Dementia largely preventing her from initiating feeding of her own volition. However was cleared by speech for dysphagia I, thin liquids once off pressors. Intermittently was noted to be hypernatremic throughout admission associated with dehydration. Improved with D5NS and scheduled hand feeds. Additionally pt with profuse stooling noted to be cdiff post. Was placed on vanc po and  flagyl iv 12/1. Pt will need to complete total of 14 day course of treatment. Flagyl d/c'd 12/9. Pt to  finish additional 4 days of abx.  # CV- Systolics initially low with sepsis but lisinopril added back once noted to be elevated to 160s+. Pt stable on home lisinopril dose  # Anemia, Chronic Normocytic - Likely due to chronic disease. Was trended and stable throughout admission.   # Sacral Ulcer - Stage 2, present on admission. Cont'd collagenase cream and coverage daily. Possible etiology of staph bacteremia? Improved throughout hospitalization course.   Issues for Follow Up: n/a  Significant Procedures: TEE- no vegetation  Significant Labs and Imaging:   Recent Labs Lab 02/20/13 0500 02/21/13 0648 02/22/13 0711  WBC 11.2* 8.9 10.7*  HGB 8.1* 8.3* 8.8*  HCT 27.1* 27.7* 28.5*  PLT 346 298 246    Recent Labs Lab 02/19/13 0425 02/20/13 0500 02/20/13 2100 02/21/13 0648 02/22/13 0711  NA 151* 149* 143 148* 144  K 3.6 3.4* 3.8 3.8 4.2  CL 120* 120* 115* 119* 116*  CO2 22 20 20  18* 18*  GLUCOSE 149* 147* 297* 170* 118*  BUN 56* 46* 43* 42* 40*  CREATININE 1.10 0.96 0.97 0.91 0.98  CALCIUM 7.7* 8.0* 7.7* 7.6* 7.8*    US Breast Right  IMPRESSION: 5.2 x 6.0 x 6.3 cm complex fluid collection over the 12 o'clock position of the right periareolar region without significant change from patient's prior exams compatible with a complex postoperative seroma at the lumpectomy site. Cannot completely exclude superimposed infection of this known fluid collection. RECOMMENDATION: Recommend followup as clinically indicated. Patient is due for bilateral screening annual mammographic followup category 2015. I have discussed the findings and recommendations with the patient. Results were also provided in writing at the conclusion of the visit. If applicable, a reminder letter will be sent to the patient regarding the next appointment. BI-RADS CATEGORY Insert category 2    2/2 Bcx- coag neg staph bacteremia   Results/Tests Pending at Time of Discharge: none  Discharge Medications:     Medication List    STOP taking these medications       acetaminophen 650 MG CR tablet  Commonly known as:  TYLENOL 8 HOUR     aspirin 81 MG tablet     barrier cream Crea  Commonly known as:  non-specified     escitalopram 10 MG tablet  Commonly known as:  LEXAPRO     fentaNYL 25 MCG/HR patch  Commonly known as:  DURAGESIC - dosed mcg/hr     FOLBEE 2.5-25-1 MG Tabs tablet  Generic drug:  Folic Acid-Vit B6-Vit B12     furosemide 20 MG tablet  Commonly known as:  LASIX     metoprolol succinate 12.5 mg Tb24 24 hr tablet  Commonly known as:  TOPROL-XL     multivitamin with minerals Tabs tablet     traMADol 50 MG tablet  Commonly known as:  ULTRAM      TAKE these medications       atorvastatin 20 MG tablet  Commonly known as:  LIPITOR  Take 1 tablet (20 mg total) by mouth daily.     calcium citrate 950 MG tablet  Commonly known as:  CALCITRATE - dosed in mg elemental calcium  Take 200 mg of elemental calcium by mouth daily.     cloNIDine 0.1 mg/24hr patch  Commonly known as:  CATAPRES - Dosed in mg/24 hr  Place 0.1 mg onto the skin once a week.  collagenase ointment  Commonly known as:  SANTYL  Apply 1 application topically daily.     dipyridamole-aspirin 200-25 MG per 12 hr capsule  Commonly known as:  AGGRENOX  Take 1 capsule by mouth 2 (two) times daily.     donepezil 10 MG tablet  Commonly known as:  ARICEPT  Take 10 mg by mouth at bedtime.     feeding supplement (PRO-STAT SUGAR FREE 64) Liqd  Take 30 mLs by mouth 2 (two) times daily.     fluticasone 50 MCG/ACT nasal spray  Commonly known as:  FLONASE  Place 1 spray into both nostrils daily.     lisinopril 2.5 MG tablet  Commonly known as:  ZESTRIL  Take 1 tablet (2.5 mg total) by mouth daily.     polyethylene glycol powder powder  Commonly known as:  GLYCOLAX/MIRALAX  Take 17 g by mouth daily as needed for mild constipation. FOR CONSTIPATION     predniSONE 20 MG tablet  Commonly known as:   DELTASONE  Take 2 tablets (40 mg total) by mouth daily with breakfast. Then 1.5 tomorrow, then 1, then 0.5 then off     promethazine 25 MG tablet  Commonly known as:  PHENERGAN  Take 1 tablet (25 mg total) by mouth every 6 (six) hours as needed for nausea.     ranitidine 150 MG tablet  Commonly known as:  ZANTAC  Take 1 tablet (150 mg total) by mouth every evening.     sulfamethoxazole-trimethoprim 800-160 MG per tablet  Commonly known as:  BACTRIM DS  Take 1 tablet by mouth every 12 (twelve) hours. For next 10 days     Travoprost (BAK Free) 0.004 % Soln ophthalmic solution  Commonly known as:  TRAVATAN  Place 1 drop into both eyes at bedtime.     vancomycin 50 mg/mL oral solution  Commonly known as:  VANCOCIN  Take 10 mLs (500 mg total) by mouth every 6 (six) hours. For 4 more days     Vitamin D (Ergocalciferol) 50000 UNITS Caps capsule  Commonly known as:  DRISDOL  Take 1 capsule (50,000 Units total) by mouth every 30 (thirty) days. Give q 15th day of each month     XARELTO 20 MG Tabs tablet  Generic drug:  Rivaroxaban  Take 20 mg by mouth daily.        Discharge Instructions: Please refer to Patient Instructions section of EMR for full details.  Patient was counseled important signs and symptoms that should prompt return to medical care, changes in medications, dietary instructions, activity restrictions, and follow up appointments.   Follow-Up Appointments:   Anselm Lis, MD 02/22/2013, 11:57 AM PGY-1, [facility] Family Medicine

## 2013-02-19 DIAGNOSIS — R4701 Aphasia: Secondary | ICD-10-CM

## 2013-02-19 LAB — CBC
HCT: 25.3 % — ABNORMAL LOW (ref 36.0–46.0)
RBC: 2.95 MIL/uL — ABNORMAL LOW (ref 3.87–5.11)
RDW: 19.2 % — ABNORMAL HIGH (ref 11.5–15.5)
WBC: 11.3 10*3/uL — ABNORMAL HIGH (ref 4.0–10.5)

## 2013-02-19 LAB — BASIC METABOLIC PANEL
CO2: 22 mEq/L (ref 19–32)
Chloride: 120 mEq/L — ABNORMAL HIGH (ref 96–112)
Creatinine, Ser: 1.1 mg/dL (ref 0.50–1.10)
GFR calc Af Amer: 55 mL/min — ABNORMAL LOW (ref >90)
GFR calc non Af Amer: 48 mL/min — ABNORMAL LOW (ref >90)
Potassium: 3.6 mEq/L (ref 3.5–5.1)
Sodium: 151 mEq/L — ABNORMAL HIGH (ref 135–145)

## 2013-02-19 LAB — GLUCOSE, CAPILLARY
Glucose-Capillary: 187 mg/dL — ABNORMAL HIGH (ref 70–99)
Glucose-Capillary: 112 mg/dL — ABNORMAL HIGH (ref 70–99)
Glucose-Capillary: 265 mg/dL — ABNORMAL HIGH (ref 70–99)

## 2013-02-19 MED ORDER — DEXTROSE 5 % IV SOLN
INTRAVENOUS | Status: DC
  Administered 2013-02-19: 999 mL via INTRAVENOUS

## 2013-02-19 MED ORDER — PREDNISONE 50 MG PO TABS
50.0000 mg | ORAL_TABLET | Freq: Every day | ORAL | Status: DC
  Administered 2013-02-20: 50 mg via ORAL
  Filled 2013-02-19 (×3): qty 1

## 2013-02-19 MED ORDER — PREDNISONE 20 MG PO TABS
40.0000 mg | ORAL_TABLET | Freq: Every day | ORAL | Status: DC
  Filled 2013-02-19: qty 2

## 2013-02-19 NOTE — Progress Notes (Signed)
[name] is a 78 y.o. female patient who transferred  From 2C awake, alert  But non speaker, Partial Code meds only, VSS - Blood pressure 127/65, pulse 102, temperature 98.4 F (36.9 C), temperature source Axillary, resp. rate 18, height 5' 6.14" (1.68 m), weight 110 kg (242 lb 8.1 oz), SpO2 98.00%., , no c/o shortness of breath, no c/o chest pain, no distress noted. Tele # [id number] placed and pt is currently running:normal sinus rhythm.   IV site WDL: internal jugular right, condition patent and no redness with a transparent dsg that's clean dry and intact.  Allergies:   Allergies  Allergen Reactions  . Penicillins Other (See Comments)    Per MAR  . Tetanus Toxoids Other (See Comments)    Per [facility]  . Vicodin [Hydrocodone-Acetaminophen] Other (See Comments)    Per [facility]     Past Medical History  Diagnosis Date  . Aphasia   . Muscle weakness   . Malaise   . Fatigue   . Breast neoplasm   . Hypertension   . Glaucoma   . UTI (lower urinary tract infection)   . Vitamin D deficiency   . Hiatal hernia   . Constipation   . Diarrhea   . Asthma   . Obesity   . Hyperlipidemia   . Alzheimer's dementia   . Depression   . Allergic rhinitis   . Dementia   . DVT (deep venous thrombosis) 2014    "put her on Xeralto" (01/20/2013)  . Pneumonia 2014  . Shortness of breath     "lately; just laying there" (01/20/2013)  . Type II diabetes mellitus   . Anemia   . History of blood transfusion     "got a unit yesterday" (01/20/2013)  . Stroke 2006; 2007    residual:  "mute, dementia, generalized weakness" (01/20/2013)  . Recurrent UTI     "at least 3 since 2008 when she went into nursing home" (01/20/2013)  . Breast cancer     "right" (01/20/2013)    Pt has a stage 3 with wet to dry dressing on sacrum and stage 2 in right buttock fold . Has foley. Will cont to monitor and assist as needed.  Cindra Eves, RN 02/19/2013 3:18 PM

## 2013-02-19 NOTE — Progress Notes (Signed)
Patient's family called to say they could not attend teh Palliative Care meeting today at scheduled time-they are requesting a phone meeting instead of in person-will call tomorrow to discuss options over the phone.  Anderson Malta, DO Palliative Medicine

## 2013-02-19 NOTE — Progress Notes (Signed)
FMTS Attending Daily Note:  Carla Don MD  [phone] pager  Family Practice pager:  [phone] I have seen and examined this patient and have reviewed their chart. I have discussed this patient with the resident. I agree with the resident's findings, assessment and care plan.  Additionally:  - Physically, seems to be back to her baseline. Agree with D5W for short period of time.  I do not think she can initiate feeding herself of her own volition.   - B/c of this, think palliative care meeting (scheduled for today) a very good idea - Ok to tx from stepdown.   Tobey Grim, MD 02/19/2013 12:35 PM

## 2013-02-19 NOTE — Clinical Social Work Note (Signed)
CSW informed sister Colvin Caroli of bed offer's received. Sister states that the facilities are not really what she wanted. CSW stated that unit CSW will follow up with her.  Roddie Mc, [place], [place], [phone]

## 2013-02-19 NOTE — Progress Notes (Signed)
Family Medicine Teaching Service Daily Progress Note Intern Pager: [phone]  Patient name: [name] Medical record number: [id number] Date of birth: 05/25/1934 Age: 78 y.o. Gender: female  Primary Care Provider: Tana Conch, MD Consultants: CCM Code Status: Partial for vasoactive drugs and antidysrhythmic drugs   Pt Overview and Major Events to Date:  02/12/13 - Admitted in septic shock, placed on pressors, started on levoquin, aztreonam 02/13/13 - started on Vanc PO/Flagyl IV for C. Diff, Vanc IV for staph bacteremia 02/14/13 - Off pressors  02/15/13 - Transitioned to Fam Med Service  02/17/13 - TEE without vegetations   Assessment and Plan: 78 year old F who presented in septic shock and placed on pressors found to have Staph bacteremia and C. Diff colitis transferred from ICU to [facility] Med for further care.   # Staph Bacteremia/Sepsis - Source unknown; TTE negative; TEE negative. Once BCx are negative x48 hours can transition to bactrim for 2 weeks. - Vanc IV started 12/1, coag neg staph X2 - resume dysphagia 1 diet - switched to 60mg  prednisone yesterday (ICU pharmacist felt that there was not a specific conversion regimen and did not actually require taper with less than 1 wk duration of stress dose steroids) - will taper to 50 mg daily tomorrow -2nd set of blood cultures pending  # C. Diff- improved stooling, one reported by nursing - Pt on Vanc PO and Flagyl IV started 12/1 - will need 14 day course of treatment - contact precautions  # Hypernatremia: sodium stable at 151 today. Likely related to poor PO intake. -continue to trend BMET -diet order placed and will encourage nursing to feed the patient -will add IV D5W to help bring this back to normal -f/u BMET at 3 pm  # Hypokalemia: normal today  # CV- systolics 140s - Hold HTN meds - until become HTN  # CVA, Hx, dementia - Permanent aphasia and dementia - restarted aggrenox, lipitor, aricept since taking PO now -  SLP reviewed and pt cleared for dysphagia I diet, continue dysphagia 1 diet - palliative care meeting today at 1:30 pm  # Anemia, Chronic Normocytic - due to chronic disease - stable at hgb 7.5 today, cont to trend  # Sacral Ulcer - Stage 2, present on admission  - Cont collagenase cream and coverage daily   FEN/GI: dysphagia 1 diet, encourage to eat given hypernatremia PPx: cont xarelto in light of improved CrCL  Disposition: pending negative blood cultures and paliative meeting today, likely returning to [facility]   Subjective: Aphasic, no acute event, is eating more than previously, decreasing diarrhea stools   Objective: Temp:  [97.4 F (36.3 C)-99 F (37.2 C)] 97.8 F (36.6 C) (12/07 0835) Pulse Rate:  [79-108] 86 (12/07 0835) Resp:  [18] 18 (12/07 0835) BP: (125-144)/(41-87) 133/84 mmHg (12/07 0835) SpO2:  [97 %-100 %] 98 % (12/07 0835) Weight:  [242 lb 8.1 oz (110 kg)] 242 lb 8.1 oz (110 kg) (12/07 0400)  Physical Exam: General: obese female, lying in bed, non distressed, aphasic, eating Cardiovascular: RRR, 3/6 systolic blowing murmur Respiratory: CTAB anteriorly, no increased WOB Abdomen: soft, ND NT Extremities: 2+ edema to just below the left knee, trace edema noted RLE  Laboratory:  Recent Labs Lab 02/17/13 0443 02/18/13 0500 02/19/13 0425  WBC 15.6* 13.8* 11.3*  HGB 7.6* 7.7* 7.5*  HCT 24.5* 25.0* 25.3*  PLT 365 335 309    Recent Labs Lab 02/12/13 1046  02/17/13 0800 02/18/13 0500 02/19/13 0425  NA  137  < > 147* 151* 151*  K 6.6*  < > 3.1* 3.2* 3.6  CL 105  < > 117* 119* 120*  CO2 14*  < > 19 20 22   BUN 72*  < > 61* 56* 56*  CREATININE 3.55*  < > 1.38* 1.19* 1.10  CALCIUM 8.1*  < > 8.2* 8.2* 7.7*  PROT 6.3  --   --   --   --   BILITOT 0.4  --   --   --   --   ALKPHOS 189*  --   --   --   --   ALT 74*  --   --   --   --   AST 114*  --   --   --   --   GLUCOSE 137*  < > 131* 138* 149*  < > = values in this interval not  displayed.   Imaging/Diagnostic Tests: No results found.    Glori Luis, MD 02/19/2013, 10:04 AM PGY-2, [facility] FPTS Intern pager: [phone], text pages welcome

## 2013-02-19 NOTE — Progress Notes (Signed)
Report given and care transferred to Diane RN, 5 west

## 2013-02-20 ENCOUNTER — Encounter (HOSPITAL_COMMUNITY): Payer: Self-pay | Admitting: Cardiology

## 2013-02-20 LAB — BASIC METABOLIC PANEL
BUN: 46 mg/dL — ABNORMAL HIGH (ref 6–23)
BUN: 43 mg/dL — ABNORMAL HIGH (ref 6–23)
CO2: 20 mEq/L (ref 19–32)
Calcium: 8 mg/dL — ABNORMAL LOW (ref 8.4–10.5)
Calcium: 7.7 mg/dL — ABNORMAL LOW (ref 8.4–10.5)
Chloride: 120 mEq/L — ABNORMAL HIGH (ref 96–112)
Chloride: 115 mEq/L — ABNORMAL HIGH (ref 96–112)
Creatinine, Ser: 0.96 mg/dL (ref 0.50–1.10)
GFR calc Af Amer: 65 mL/min — ABNORMAL LOW (ref >90)
GFR calc non Af Amer: 56 mL/min — ABNORMAL LOW (ref >90)
GFR calc non Af Amer: 55 mL/min — ABNORMAL LOW (ref >90)
Glucose, Bld: 147 mg/dL — ABNORMAL HIGH (ref 70–99)
Glucose, Bld: 297 mg/dL — ABNORMAL HIGH (ref 70–99)
Potassium: 3.4 mEq/L — ABNORMAL LOW (ref 3.5–5.1)
Sodium: 143 mEq/L (ref 135–145)

## 2013-02-20 LAB — GLUCOSE, CAPILLARY
Glucose-Capillary: 158 mg/dL — ABNORMAL HIGH (ref 70–99)
Glucose-Capillary: 113 mg/dL — ABNORMAL HIGH (ref 70–99)
Glucose-Capillary: 210 mg/dL — ABNORMAL HIGH (ref 70–99)
Glucose-Capillary: 243 mg/dL — ABNORMAL HIGH (ref 70–99)

## 2013-02-20 LAB — CBC
HCT: 27.1 % — ABNORMAL LOW (ref 36.0–46.0)
Hemoglobin: 8.1 g/dL — ABNORMAL LOW (ref 12.0–15.0)
MCH: 25.8 pg — ABNORMAL LOW (ref 26.0–34.0)
MCHC: 29.9 g/dL — ABNORMAL LOW (ref 30.0–36.0)
RDW: 20.5 % — ABNORMAL HIGH (ref 11.5–15.5)

## 2013-02-20 MED ORDER — SODIUM CHLORIDE 0.9 % IJ SOLN
10.0000 mL | Freq: Two times a day (BID) | INTRAMUSCULAR | Status: DC
  Administered 2013-02-21 – 2013-02-22 (×2): 10 mL

## 2013-02-20 MED ORDER — SULFAMETHOXAZOLE-TMP DS 800-160 MG PO TABS
1.0000 | ORAL_TABLET | Freq: Two times a day (BID) | ORAL | Status: DC
  Administered 2013-02-20 – 2013-02-22 (×5): 1 via ORAL
  Filled 2013-02-20 (×6): qty 1

## 2013-02-20 MED ORDER — SODIUM CHLORIDE 0.9 % IJ SOLN
10.0000 mL | INTRAMUSCULAR | Status: DC | PRN
  Administered 2013-02-20: 20 mL
  Administered 2013-02-20: 10 mL

## 2013-02-20 MED ORDER — DEXTROSE 5 % IV SOLN
INTRAVENOUS | Status: DC
  Administered 2013-02-20: 22:00:00 via INTRAVENOUS
  Administered 2013-02-20: 1000 mL via INTRAVENOUS

## 2013-02-20 MED ORDER — LISINOPRIL 2.5 MG PO TABS
2.5000 mg | ORAL_TABLET | Freq: Every day | ORAL | Status: DC
  Administered 2013-02-20 – 2013-02-22 (×3): 2.5 mg via ORAL
  Filled 2013-02-20 (×3): qty 1

## 2013-02-20 MED ORDER — POTASSIUM CHLORIDE CRYS ER 20 MEQ PO TBCR
20.0000 meq | EXTENDED_RELEASE_TABLET | Freq: Once | ORAL | Status: AC
  Administered 2013-02-20: 20 meq via ORAL
  Filled 2013-02-20: qty 1

## 2013-02-20 NOTE — Progress Notes (Signed)
Family Medicine Teaching Service Daily Progress Note Intern Pager: [phone]  Patient name: [name] Medical record number: [id number] Date of birth: 16-Oct-1934 Age: 78 y.o. Gender: female  Primary Care Provider: Tana Conch, MD Consultants: CCM Code Status: Partial for vasoactive drugs and antidysrhythmic drugs   Pt Overview and Major Events to Date:  02/12/13 - Admitted in septic shock, placed on pressors, started on levoquin, aztreonam 02/13/13 - started on Vanc PO/Flagyl IV for C. Diff, Vanc IV for staph bacteremia 02/14/13 - Off pressors  02/15/13 - Transitioned to Fam Med Service  02/17/13 - TEE without vegetations   Assessment and Plan: 78 year old F who presented in septic shock and placed on pressors found to have Staph bacteremia and C. Diff colitis transferred from ICU to [facility] Med for further care.   # Staph Bacteremia/Sepsis - Source unknown; TTE negative; TEE negative. Will switch to bactrim today and watch pt for 24 hrs - Vanc IV started 12/1, coag neg staph X2 now transitioned to PO bactrim - resume dysphagia 1 diet - switched to 50mg  prednisone yesterday, will continue to taper -2nd set of blood cultures pending- neg X48hrs  # C. Diff- improved stooling, two reported by nursing - Pt on Vanc PO and Flagyl IV started 12/1 - will need 14 day course of treatment - contact precautions  # Hypernatremia: sodium stable at 149 today. Likely related to poor PO intake. -continue to trend BMET -diet order placed and will encourage nursing to feed the patient -will add IV D5W to help bring this back to normal -f/u BMET at 3 pm  # Hypokalemia: noted to be 3.4 on BMET today -replete with 20kdur  # CV- systolics 140s-160s - adding lisinopril back today  # CVA, Hx, dementia - Permanent aphasia and dementia, initial encephalopathy appears to have resolved and pt back to baseline - restarted aggrenox, lipitor, aricept since taking PO now - SLP reviewed and pt cleared for  dysphagia I diet, continue dysphagia 1 diet - family desiring palliative meeting over phone  # Anemia, Chronic Normocytic - due to chronic disease - stable at hgb 8.1 today, cont to trend  # Sacral Ulcer - Stage 2, present on admission  - Cont collagenase cream and coverage daily   FEN/GI: Protein calorie malnutrition- Pt with chronic malnutrition, followed by RD's during previous hospitalizations. Dementia largely preventing her from initiating feeding of her own volition. However was cleared by speech for dysphagia I, will continue dysphagia 1 diet, encourage to eat given hypernatremia  PPx: cont xarelto in light of improved CrCL  Disposition: pending negative blood cultures and paliative meeting today, likely [facility] later today or [facility]  Subjective: Aphasic but per nursing appears to have done okay overnight  Objective: Temp:  [97.8 F (36.6 C)-99 F (37.2 C)] 98.3 F (36.8 C) (12/08 0623) Pulse Rate:  [86-106] 86 (12/08 0623) Resp:  [18] 18 (12/08 0623) BP: (118-162)/(65-84) 162/79 mmHg (12/08 0623) SpO2:  [94 %-98 %] 94 % (12/08 0623) Weight:  [247 lb 12.8 oz (112.4 kg)] 247 lb 12.8 oz (112.4 kg) (12/08 1478)  Physical Exam: General: obese female, lying in bed, non distressed, aphasic,  Cardiovascular: RRR, 3/6 systolic blowing murmur Respiratory: CTAB anteriorly, no increased WOB Abdomen: soft, ND NT Extremities: 2+ edema to just below the left knee, trace edema noted RLE  Laboratory:  Recent Labs Lab 02/18/13 0500 02/19/13 0425 02/20/13 0500  WBC 13.8* 11.3* 11.2*  HGB 7.7* 7.5* 8.1*  HCT 25.0* 25.3* 27.1*  PLT 335 309 346    Recent Labs Lab 02/18/13 0500 02/19/13 0425 02/20/13 0500  NA 151* 151* 149*  K 3.2* 3.6 3.4*  CL 119* 120* 120*  CO2 20 22 20   BUN 56* 56* 46*  CREATININE 1.19* 1.10 0.96  CALCIUM 8.2* 7.7* 8.0*  GLUCOSE 138* 149* 147*     Imaging/Diagnostic Tests: No results found.    Anselm Lis, MD 02/20/2013, 7:30  AM PGY-1, [facility] Family Medicine FPTS Intern pager: [phone], text pages welcome

## 2013-02-20 NOTE — Progress Notes (Signed)
Thank you for consulting the Palliative Medicine Team at [facility] to meet your patient's and family's needs.   The reason that you asked [place] to see your patient is for goals of care and options.  We have scheduled your patient for a meeting: Tuesday, 02/21/13 1100am PHONE MEETING  The Surrogate decision make is: Colvin Caroli [phone]  Other family members that need to be present: Amil Amen (niece) will be present with Colvin Caroli and they will be on speaker phone.  Your patient is able/unable to participate: Non-verbal  Yong Channel, NP Palliative Medicine Team Team Phone # [phone]

## 2013-02-20 NOTE — Progress Notes (Signed)
I have seen and examined this patient. I have discussed with Dr Marsh.  I agree with their findings and plans as documented in their progress note.    

## 2013-02-20 NOTE — Progress Notes (Signed)
Speech Language Pathology Treatment: Dysphagia  Patient Details Name: [name] MRN: [id number] DOB: Sep 26, 1934 Today's Date: 02/20/2013 Time: 4098-1191 SLP Time Calculation (min): 16 min  Assessment / Plan / Recommendation Clinical Impression  Dysphagia treatment during part of breakfast meal.  She required max-total tactile assist to remove eggs from left and right buccal cavities.  No cough, throat clear and no vocalizations to check vocal quality.  Mildly increased respirations observed.  No repeat CXR since initial; afebrile.  Pt. Constantly manipulatimg tiny pieces of grits in oral cavity but unable to transit with verbal cues and sips thin.  Do not recommend upgrading from Dys 1 at present.  Will discharge ST.  Please contact if any difficulties.   HPI HPI: 78 year old female admitted 02/12/13  due to hypotension, decreased responsiveness, and UTI.  PMH significant for breast neoplasm, vascular dementia, CKD, DM, HTN, sacral ulcers, PNA, CVA x3, hiatal hernia.  CXR revealed low volumes, vascular crowding vs vascular congestion.  Pt was seen for BSE last month, at which time Puree diet with thin liquids was recommended.   Pertinent Vitals WDL  SLP Plan  All goals met    Recommendations Diet recommendations: Dysphagia 1 (puree);Thin liquid Liquids provided via: Straw;Cup Medication Administration: Crushed with puree Supervision: Full supervision/cueing for compensatory strategies;Trained caregiver to feed patient Compensations: Slow rate;Small sips/bites;Check for pocketing Postural Changes and/or Swallow Maneuvers: Seated upright 90 degrees;Upright 30-60 min after meal              Oral Care Recommendations: Oral care before and after PO Follow up Recommendations: 24 hour supervision/assistance Plan: All goals met    GO     Royce Macadamia M.Ed [employer] [phone]  02/20/2013

## 2013-02-21 DIAGNOSIS — A0472 Enterocolitis due to Clostridium difficile, not specified as recurrent: Secondary | ICD-10-CM

## 2013-02-21 LAB — CBC
HCT: 27.7 % — ABNORMAL LOW (ref 36.0–46.0)
Hemoglobin: 8.3 g/dL — ABNORMAL LOW (ref 12.0–15.0)
MCH: 26.1 pg (ref 26.0–34.0)
MCV: 87.1 fL (ref 78.0–100.0)
RBC: 3.18 MIL/uL — ABNORMAL LOW (ref 3.87–5.11)

## 2013-02-21 LAB — BASIC METABOLIC PANEL
BUN: 42 mg/dL — ABNORMAL HIGH (ref 6–23)
CO2: 18 mEq/L — ABNORMAL LOW (ref 19–32)
Calcium: 7.6 mg/dL — ABNORMAL LOW (ref 8.4–10.5)
Glucose, Bld: 170 mg/dL — ABNORMAL HIGH (ref 70–99)
Potassium: 3.8 mEq/L (ref 3.5–5.1)
Sodium: 148 mEq/L — ABNORMAL HIGH (ref 135–145)

## 2013-02-21 LAB — GLUCOSE, CAPILLARY
Glucose-Capillary: 185 mg/dL — ABNORMAL HIGH (ref 70–99)
Glucose-Capillary: 147 mg/dL — ABNORMAL HIGH (ref 70–99)
Glucose-Capillary: 180 mg/dL — ABNORMAL HIGH (ref 70–99)
Glucose-Capillary: 200 mg/dL — ABNORMAL HIGH (ref 70–99)
Glucose-Capillary: 220 mg/dL — ABNORMAL HIGH (ref 70–99)

## 2013-02-21 MED ORDER — PREDNISONE 20 MG PO TABS
40.0000 mg | ORAL_TABLET | Freq: Every day | ORAL | Status: DC
  Administered 2013-02-21 – 2013-02-22 (×2): 40 mg via ORAL
  Filled 2013-02-21 (×3): qty 2

## 2013-02-21 NOTE — Progress Notes (Signed)
I have seen and examined this patient. I have discussed with Dr Marsh.  I agree with their findings and plans as documented in their progress note.    

## 2013-02-21 NOTE — Clinical Social Work Note (Signed)
CSW has spoken with sister Colvin Caroli to determine which facility she has decided on. Sister states that she has not made a decision and is not really wanting to send the patient to any of the facilities listed. CSW encouraged sister to determine the best option out of the facilities available as decision will need to be made once patient is stable for DC.  Roddie Mc, [place], [place], [phone]

## 2013-02-21 NOTE — Progress Notes (Signed)
Family Medicine Teaching Service Daily Progress Note Intern Pager: [phone]  Patient name: [name] Medical record number: [id number] Date of birth: 1934/10/11 Age: 78 y.o. Gender: female  Primary Care Provider: Tana Conch, MD Consultants: CCM Code Status: Partial for vasoactive drugs and antidysrhythmic drugs   Pt Overview and Major Events to Date:  02/12/13 - Admitted in septic shock, placed on pressors, started on levoquin, aztreonam 02/13/13 - started on Vanc PO/Flagyl IV for C. Diff, Vanc IV for staph bacteremia 02/14/13 - Off pressors  02/15/13 - Transitioned to Fam Med Service  02/17/13 - TEE without vegetations   Assessment and Plan: 78 year old F who presented in septic shock and placed on pressors found to have Staph bacteremia and C. Diff colitis transferred from ICU to [facility] Med for further care.   # Staph Bacteremia/Sepsis - Source unknown; TTE negative; TEE negative. Appears pt has tolerated bactrim switch well - Vanc IV started 12/1, coag neg staph X2 now transitioned to PO bactrim (12/8) - resume dysphagia 1 diet - switched to 50mg  prednisone yesterday, will continue to taper -2nd set of blood cultures- neg X48hrs  # C. Diff- improved stooling, three reported by nursing - Pt on Vanc PO and Flagyl IV started 12/1 - will need 14 day course of treatment- flagyl stopped today - contact precautions  # Hypernatremia: sodium much improved 143 yesterday, Likely related to poor PO intake. -continue to trend BMET -diet order placed and will encourage nursing to feed the patient  # CV- systolics 110-130s - added lisinopril back yesterday  # CVA, Hx, dementia - Permanent aphasia and dementia, initial encephalopathy appears to have resolved and pt back to baseline - restarted aggrenox, lipitor, aricept since taking PO now - SLP reviewed and pt cleared for dysphagia I diet, continue dysphagia 1 diet - family desiring palliative meeting over phone  # Anemia, Chronic  Normocytic - due to chronic disease - stable at hgb 8.3 today, cont to trend  # Sacral Ulcer - Stage 2, present on admission  - Cont collagenase cream and coverage daily   FEN/GI: Protein calorie malnutrition- Pt with chronic malnutrition, followed by RD's during previous hospitalizations. Dementia largely preventing her from initiating feeding of her own volition. However was cleared by speech for dysphagia I, will continue dysphagia 1 diet, encourage to eat given hypernatremia  PPx: cont xarelto in light of improved CrCL  Disposition: paliative meeting today, likely [facility] later today or [facility]  Subjective: Aphasic does not appear in discomfort  Objective: Temp:  [97.8 F (36.6 C)-98.4 F (36.9 C)] 98 F (36.7 C) (12/09 0414) Pulse Rate:  [81-111] 81 (12/09 0414) Resp:  [18-20] 18 (12/09 0414) BP: (114-132)/(76-88) 119/76 mmHg (12/09 0414) SpO2:  [96 %-99 %] 98 % (12/09 0414) Weight:  [250 lb 14.1 oz (113.8 kg)] 250 lb 14.1 oz (113.8 kg) (12/09 0500)  Physical Exam: General: obese female, sleeping but awakens to name sitting up  in bed, non distressed, aphasic,  Cardiovascular: RRR, 3/6 systolic blowing murmur Respiratory: CTAB anteriorly, no increased WOB Abdomen: soft, ND NT Extremities: 2+ edema to just below the left knee, trace edema noted RLE  Laboratory:  Recent Labs Lab 02/19/13 0425 02/20/13 0500 02/21/13 0648  WBC 11.3* 11.2* 8.9  HGB 7.5* 8.1* 8.3*  HCT 25.3* 27.1* 27.7*  PLT 309 346 298    Recent Labs Lab 02/19/13 0425 02/20/13 0500 02/20/13 2100  NA 151* 149* 143  K 3.6 3.4* 3.8  CL 120* 120* 115*  CO2 22 20 20   BUN 56* 46* 43*  CREATININE 1.10 0.96 0.97  CALCIUM 7.7* 8.0* 7.7*  GLUCOSE 149* 147* 297*     Imaging/Diagnostic Tests: No results found.    Anselm Lis, MD 02/21/2013, 7:35 AM PGY-1, [facility] Family Medicine FPTS Intern pager: [phone], text pages welcome

## 2013-02-21 NOTE — Clinical Social Work Note (Signed)
MD has informed patient's sister that patient will DC to SNF on 02/22/13. CSW will follow up with sister on 02/22/13.  Roddie Mc, [place], [place], [phone]

## 2013-02-21 NOTE — Care Management Note (Signed)
    Page 1 of 2   02/21/2013     4:17:30 PM   CARE MANAGEMENT NOTE 02/21/2013  Patient:  [name], [name]   Account Number:  [id number]  Date Initiated:  02/13/2013  Documentation initiated by:  [facility]  Subjective/Objective Assessment:   Admitted with hypotension and AMS.  UTI - on pressors - now Cdif positive.     Action/Plan:   Anticipated DC Date:  02/22/2013   Anticipated DC Plan:  SKILLED NURSING FACILITY  In-house referral  Clinical Social Worker      DC Planning Services  CM consult      Choice offered to / List presented to:             Status of service:  Completed, signed off Medicare Important Message given?   (If response is "NO", the following Medicare IM given date fields will be blank) Date Medicare IM given:   Date Additional Medicare IM given:    Discharge Disposition:  SKILLED NURSING FACILITY  Per UR Regulation:  Reviewed for med. necessity/level of care/duration of stay  If discussed at Long Length of Stay Meetings, dates discussed:    Comments:  ContactCora Collum Sister [phone]                 Amil Amen Niece     [phone]                 Redgie Grayer Sister [phone]  02/21/13 16:16 Letha Cape RN, BSN [phone] patient for dc to SNF tomorrow per MD note.  02/17/13 2536 Verdis Prime RN MSN BSN CCM Transferred to 2C,  TEE scheduled for today.  02-16-13 2pm Avie Arenas, RNBSN [phone] Passed swallowing test - placed on dysphasia 1 diet. Patient remains lethargic.  Plan for discharge back to a SNF when medically ready.  Has had orders for SDU but no bed so is here in ICU as overflow. SW following.

## 2013-02-21 NOTE — Consult Note (Addendum)
Palliative Medicine Team Consult Note  Spoke with patient's HCPOA Colvin Caroli.  Summary of Goals:  1. DNR  2. Discharge to SNF- Family DO NOT WANT RETURN TO [facility]-cited multiple reasons including that her brother died there so this is particularly painful for her. She is unable to physically care for her sister at home. 3. Family open to Hospice Referral at SNF and I strongly suggest that this be initiated upon discharge to prevent re-admission. I am not certain family is ready for a hospice facility even though I think she would be very appropriate for this level of care-if full comfort is the goal. 4. Maximize comfort care and continue to treat reversible conditions.Try to avoid re-hospitalization. 5. Careful hand feeding, no feeding tubes-D1 diet.  Full consult note to follow.  Anderson Malta, DO Palliative Medicine

## 2013-02-21 NOTE — Progress Notes (Signed)
Family Practice Teaching Service Interval Progress Note  Spoke to the patients sister regarding nursing home placement. States she is upset regarding the short notice we have given her regarding discharge to SNF. She is requesting more time to decide on a SNF to go to since they do not want her to return to [facility] as she has had repeated hospitalizations since being there. I advised the sister to work today on picking out a SNF and then we will work tomorrow on getting her placed in a SNF of their choice.  Marikay Alar, MD Family Medicine PGY-2 Service Pager [phone]

## 2013-02-21 NOTE — Clinical Social Work Note (Signed)
CSW spoke with sister to inform her that the patient is medically stable for DC. Sister claims that she has not spoken with anyone about the patient regarding her care at [facility] and is demanding to speak with the MD. CSW explain that sister still needs to determine which SNF patient will go to. CSW has paged MD. MD states that she will follow up with sister.   Roddie Mc, [place], [place], [phone]

## 2013-02-21 NOTE — Progress Notes (Signed)
Nutrition Brief Note  Chart reviewed. Pt now transitioning to comfort care.  No further nutrition interventions warranted at this time.  Please re-consult as needed.   Ethen Bannan MS, RD, LDN Pager: [phone] After-hours pager: [phone]    

## 2013-02-22 DIAGNOSIS — Z515 Encounter for palliative care: Secondary | ICD-10-CM | POA: Insufficient documentation

## 2013-02-22 DIAGNOSIS — F0390 Unspecified dementia without behavioral disturbance: Secondary | ICD-10-CM

## 2013-02-22 LAB — CULTURE, BLOOD (ROUTINE X 2): Culture: NO GROWTH

## 2013-02-22 LAB — BASIC METABOLIC PANEL
CO2: 18 mEq/L — ABNORMAL LOW (ref 19–32)
Calcium: 7.8 mg/dL — ABNORMAL LOW (ref 8.4–10.5)
Creatinine, Ser: 0.98 mg/dL (ref 0.50–1.10)
GFR calc Af Amer: 63 mL/min — ABNORMAL LOW (ref >90)
Glucose, Bld: 118 mg/dL — ABNORMAL HIGH (ref 70–99)

## 2013-02-22 LAB — CBC
Hemoglobin: 8.8 g/dL — ABNORMAL LOW (ref 12.0–15.0)
MCH: 26.7 pg (ref 26.0–34.0)
MCHC: 30.9 g/dL (ref 30.0–36.0)
Platelets: 246 10*3/uL (ref 150–400)
RDW: 21.9 % — ABNORMAL HIGH (ref 11.5–15.5)

## 2013-02-22 LAB — GLUCOSE, CAPILLARY
Glucose-Capillary: 110 mg/dL — ABNORMAL HIGH (ref 70–99)
Glucose-Capillary: 124 mg/dL — ABNORMAL HIGH (ref 70–99)
Glucose-Capillary: 139 mg/dL — ABNORMAL HIGH (ref 70–99)
Glucose-Capillary: 202 mg/dL — ABNORMAL HIGH (ref 70–99)

## 2013-02-22 MED ORDER — ATORVASTATIN CALCIUM 20 MG PO TABS: 20.0000 mg | ORAL_TABLET | Freq: Every day | ORAL | Status: DC

## 2013-02-22 MED ORDER — LISINOPRIL 2.5 MG PO TABS: 2.5000 mg | ORAL_TABLET | Freq: Every day | ORAL | Status: DC

## 2013-02-22 MED ORDER — SULFAMETHOXAZOLE-TMP DS 800-160 MG PO TABS: 1.0000 | ORAL_TABLET | Freq: Two times a day (BID) | ORAL | Status: DC

## 2013-02-22 MED ORDER — RANITIDINE HCL 150 MG PO TABS: 150.0000 mg | ORAL_TABLET | Freq: Every evening | ORAL | Status: DC

## 2013-02-22 MED ORDER — POLYETHYLENE GLYCOL 3350 17 GM/SCOOP PO POWD: 17.0000 g | Freq: Every day | ORAL | Status: DC | PRN

## 2013-02-22 MED ORDER — VITAMIN D (ERGOCALCIFEROL) 1.25 MG (50000 UNIT) PO CAPS: 50000.0000 [IU] | ORAL_CAPSULE | ORAL | Status: DC

## 2013-02-22 MED ORDER — TRAVOPROST (BAK FREE) 0.004 % OP SOLN: 1.0000 [drp] | Freq: Every day | OPHTHALMIC | Status: DC

## 2013-02-22 MED ORDER — VANCOMYCIN 50 MG/ML ORAL SOLUTION: 500.0000 mg | Freq: Four times a day (QID) | ORAL | Status: DC

## 2013-02-22 MED ORDER — PROMETHAZINE HCL 25 MG PO TABS: 25.0000 mg | ORAL_TABLET | Freq: Four times a day (QID) | ORAL | Status: DC | PRN

## 2013-02-22 MED ORDER — PREDNISONE 20 MG PO TABS: 40.0000 mg | ORAL_TABLET | Freq: Every day | ORAL | Status: DC

## 2013-02-22 NOTE — Plan of Care (Signed)
Problem: Discharge Progression Outcomes Goal: Discharge plan in place and appropriate Outcome: Completed/Met Date Met:  02/22/13 [facility]

## 2013-02-22 NOTE — Clinical Social Work Note (Signed)
Per MD patient ready for DC 02/22/13. Patient will return to [facility]. Sister [name], RN, and facility notified of DC. RN given number for report. RN instructed to called sister when EMS is picking patient up as sister wants to be at facility when patient arrives. Ambulance transport requested for patient. DC packet left with patient's chart. CSW signing off.  Roddie Mc, [place], [place], [phone]

## 2013-02-22 NOTE — Discharge Summary (Signed)
I have seen and examined this patient. I have discussed with Dr Marsh.  I agree with their findings and plans as documented in their discharge note.    

## 2013-02-22 NOTE — Clinical Social Work Note (Signed)
CSW spoke with sister Colvin Caroli. Sister states that she is not happy with any of the facilities that have made bed offers. She states that she will send patient back to [facility]. MD notified that DC summary is needed. CSW will continue to assist with DC.  Roddie Mc, [place], [place], [phone]

## 2013-02-22 NOTE — Progress Notes (Addendum)
Tanya Nones Genis to be D/C'd Skilled nursing facility([facility]) per MD order.  Discussed with the sister and all questions fully answered.    Medication List    STOP taking these medications       acetaminophen 650 MG CR tablet  Commonly known as:  TYLENOL 8 HOUR     aspirin 81 MG tablet     barrier cream Crea  Commonly known as:  non-specified     escitalopram 10 MG tablet  Commonly known as:  LEXAPRO     fentaNYL 25 MCG/HR patch  Commonly known as:  DURAGESIC - dosed mcg/hr     FOLBEE 2.5-25-1 MG Tabs tablet  Generic drug:  Folic Acid-Vit B6-Vit B12     furosemide 20 MG tablet  Commonly known as:  LASIX     metoprolol succinate 12.5 mg Tb24 24 hr tablet  Commonly known as:  TOPROL-XL     multivitamin with minerals Tabs tablet     traMADol 50 MG tablet  Commonly known as:  ULTRAM      TAKE these medications       atorvastatin 20 MG tablet  Commonly known as:  LIPITOR  Take 1 tablet (20 mg total) by mouth daily.     calcium citrate 950 MG tablet  Commonly known as:  CALCITRATE - dosed in mg elemental calcium  Take 200 mg of elemental calcium by mouth daily.     cloNIDine 0.1 mg/24hr patch  Commonly known as:  CATAPRES - Dosed in mg/24 hr  Place 0.1 mg onto the skin once a week.     collagenase ointment  Commonly known as:  SANTYL  Apply 1 application topically daily.     dipyridamole-aspirin 200-25 MG per 12 hr capsule  Commonly known as:  AGGRENOX  Take 1 capsule by mouth 2 (two) times daily.     donepezil 10 MG tablet  Commonly known as:  ARICEPT  Take 10 mg by mouth at bedtime.     feeding supplement (PRO-STAT SUGAR FREE 64) Liqd  Take 30 mLs by mouth 2 (two) times daily.     fluticasone 50 MCG/ACT nasal spray  Commonly known as:  FLONASE  Place 1 spray into both nostrils daily.     lisinopril 2.5 MG tablet  Commonly known as:  ZESTRIL  Take 1 tablet (2.5 mg total) by mouth daily.     polyethylene glycol powder powder  Commonly known as:   GLYCOLAX/MIRALAX  Take 17 g by mouth daily as needed for mild constipation. FOR CONSTIPATION     predniSONE 20 MG tablet  Commonly known as:  DELTASONE  Take 2 tablets (40 mg total) by mouth daily with breakfast. Then 1.5 tomorrow, then 1, then 0.5 then off     promethazine 25 MG tablet  Commonly known as:  PHENERGAN  Take 1 tablet (25 mg total) by mouth every 6 (six) hours as needed for nausea.     ranitidine 150 MG tablet  Commonly known as:  ZANTAC  Take 1 tablet (150 mg total) by mouth every evening.     sulfamethoxazole-trimethoprim 800-160 MG per tablet  Commonly known as:  BACTRIM DS  Take 1 tablet by mouth every 12 (twelve) hours. For next 10 days     Travoprost (BAK Free) 0.004 % Soln ophthalmic solution  Commonly known as:  TRAVATAN  Place 1 drop into both eyes at bedtime.     vancomycin 50 mg/mL oral solution  Commonly known as:  VANCOCIN  Take  10 mLs (500 mg total) by mouth every 6 (six) hours. For 4 more days     Vitamin D (Ergocalciferol) 50000 UNITS Caps capsule  Commonly known as:  DRISDOL  Take 1 capsule (50,000 Units total) by mouth every 30 (thirty) days. Give q 15th day of each month     XARELTO 20 MG Tabs tablet  Generic drug:  Rivaroxaban  Take 20 mg by mouth daily.        VVS, Skin clean and dry with multiple wounds present. Charted in EPIC. IV catheter discontinued intact. Site without signs and symptoms of complications. Dressing and pressure applied.  An After Visit Summary was printed and given to PTAR  D/c education completed with sister and [facility] nurse including follow up instructions, medication list, d/c activities limitations if indicated, with other d/c instructions as indicated by MD - Nurse from [place] able to verbalize understanding, all questions fully answered.  Patient instructed to return to ED, call 911, or call MD for any changes in condition.   Patient escorted via stretcher, and D/C to [facility] via  [place].  RICARDA, ATAYDE D 02/22/2013 2:38 PM

## 2013-02-23 ENCOUNTER — Non-Acute Institutional Stay (INDEPENDENT_AMBULATORY_CARE_PROVIDER_SITE_OTHER): Payer: Medicare Other | Admitting: Emergency Medicine

## 2013-02-23 ENCOUNTER — Non-Acute Institutional Stay: Payer: Medicare Other | Admitting: Family Medicine

## 2013-02-23 DIAGNOSIS — I359 Nonrheumatic aortic valve disorder, unspecified: Secondary | ICD-10-CM

## 2013-02-23 DIAGNOSIS — R7881 Bacteremia: Secondary | ICD-10-CM

## 2013-02-23 DIAGNOSIS — F0150 Vascular dementia without behavioral disturbance: Secondary | ICD-10-CM

## 2013-02-23 DIAGNOSIS — I10 Essential (primary) hypertension: Secondary | ICD-10-CM

## 2013-02-23 DIAGNOSIS — IMO0002 Reserved for concepts with insufficient information to code with codable children: Secondary | ICD-10-CM

## 2013-02-23 DIAGNOSIS — B957 Other staphylococcus as the cause of diseases classified elsewhere: Secondary | ICD-10-CM

## 2013-02-23 DIAGNOSIS — L8992 Pressure ulcer of unspecified site, stage 2: Secondary | ICD-10-CM

## 2013-02-23 DIAGNOSIS — A419 Sepsis, unspecified organism: Secondary | ICD-10-CM

## 2013-02-23 DIAGNOSIS — L89109 Pressure ulcer of unspecified part of back, unspecified stage: Secondary | ICD-10-CM

## 2013-02-23 DIAGNOSIS — A0472 Enterocolitis due to Clostridium difficile, not specified as recurrent: Secondary | ICD-10-CM | POA: Insufficient documentation

## 2013-02-23 DIAGNOSIS — I82409 Acute embolism and thrombosis of unspecified deep veins of unspecified lower extremity: Secondary | ICD-10-CM

## 2013-02-23 DIAGNOSIS — R05 Cough: Secondary | ICD-10-CM | POA: Insufficient documentation

## 2013-02-23 DIAGNOSIS — S7291XP Unspecified fracture of right femur, subsequent encounter for closed fracture with malunion: Secondary | ICD-10-CM

## 2013-02-23 DIAGNOSIS — D649 Anemia, unspecified: Secondary | ICD-10-CM

## 2013-02-23 DIAGNOSIS — I82402 Acute embolism and thrombosis of unspecified deep veins of left lower extremity: Secondary | ICD-10-CM

## 2013-02-23 DIAGNOSIS — I350 Nonrheumatic aortic (valve) stenosis: Secondary | ICD-10-CM

## 2013-02-23 DIAGNOSIS — E785 Hyperlipidemia, unspecified: Secondary | ICD-10-CM

## 2013-02-23 DIAGNOSIS — S7290XD Unspecified fracture of unspecified femur, subsequent encounter for closed fracture with routine healing: Secondary | ICD-10-CM

## 2013-02-23 DIAGNOSIS — L89152 Pressure ulcer of sacral region, stage 2: Secondary | ICD-10-CM

## 2013-02-23 DIAGNOSIS — S7291XD Unspecified fracture of right femur, subsequent encounter for closed fracture with routine healing: Secondary | ICD-10-CM

## 2013-02-23 DIAGNOSIS — F329 Major depressive disorder, single episode, unspecified: Secondary | ICD-10-CM

## 2013-02-23 HISTORY — DX: Enterocolitis due to Clostridium difficile, not specified as recurrent: A04.72

## 2013-02-23 NOTE — Assessment & Plan Note (Signed)
Stable. Will restart tylenol 650mg  TID at sister's request. Leg to be kept straight to allow for continued healing.

## 2013-02-23 NOTE — Assessment & Plan Note (Signed)
Stooling is improving. Continue PO vanc 500mg  q6hr until she completes bactrim to decrease risk of relapse. Contact precautions until treatment is completed.

## 2013-02-23 NOTE — Assessment & Plan Note (Signed)
Left lower extremity DVT. Patient has residual 2+ pitting edema. -Continue Xarelto daily -Apply compression to help relieve significant edema

## 2013-02-23 NOTE — Progress Notes (Signed)
Patient ID: [name], female   DOB: 1934/05/12, 78 y.o.   MRN: [id number] Family Medicine Teaching Service Nursing Home Admission Note  Patient name: ALAIAH LUNDY Medical record number: [id number] Date of birth: [birth year] Age: 78 y.o. Gender: female  Primary Care Provider: Tana Conch, MD  Chief Complaint: Readmission to [facility] after hospital discharge History of Present Illness: BRYELLE SPIEWAK is a 78 y.o. year old female with past medical history as stated below who was recently admitted from 02/12/2013 through 02/22/2013 with Staphylococcus bacteremia, C. difficile colitis, and sepsis. Patient initially was admitted to the intensive care year and was eventually transferred to the [facility] cone family practice teaching service. I personally reviewed the discharge summary from 12/27/2012. I have also reviewed the resident note completed by Dr. Piedad Climes on 01/27/2013. Please refer to the resident note for history of present illness.  Ms. easton is currently unable to answer any further questions in regards to her medical care secondary to severe dementia status post CVA in the past. No family members are present to obtain additional history of present illness.  Dr. Piedad Climes has spoken to the patient's sister and power of attorney Ms. Colvin Caroli who at this time has elected for her sister to remain full code. Palliative care is involved in the care of this patient and further goals of care will need to be addressed.  Patient Active Problem List   Diagnosis Date Noted  . C. difficile colitis 02/23/2013  . Cough 02/23/2013  . Coagulase negative Staphylococcus bacteremia 02/12/2013  . Closed fracture of right femur 01/24/2013  . Stroke 01/14/2013  . Acute on chronic kidney failure 01/14/2013  . Anemia 05/25/2012  . DVT (deep venous thrombosis) 05/12/2012  . Mitral stenosis 02/15/2012  . Sacral pressure sore 02/15/2012  . At risk for diabetes mellitus 12/27/2011  . Hypertension  12/27/2011  . Aortic stenosis 12/11/2011  . CKD (chronic kidney disease) stage 2, GFR 60-89 ml/min 12/11/2011  . Breast neoplasm   . Glaucoma   . Vitamin D deficiency   . Hiatal hernia   . Obesity   . Hyperlipidemia   . Vascular dementia   . Depression   . Allergic rhinitis    Past Medical History: Past Medical History  Diagnosis Date  . Aphasia   . Muscle weakness   . Malaise   . Fatigue   . Breast neoplasm   . Hypertension   . Glaucoma   . UTI (lower urinary tract infection)   . Vitamin D deficiency   . Hiatal hernia   . Constipation   . Diarrhea   . Asthma   . Obesity   . Hyperlipidemia   . Alzheimer's dementia   . Depression   . Allergic rhinitis   . Dementia   . DVT (deep venous thrombosis) 2014    "put her on Xeralto" (01/20/2013)  . Pneumonia 2014  . Shortness of breath     "lately; just laying there" (01/20/2013)  . Type II diabetes mellitus   . Anemia   . History of blood transfusion     "got a unit yesterday" (01/20/2013)  . Stroke 2006; 2007    residual:  "mute, dementia, generalized weakness" (01/20/2013)  . Recurrent UTI     "at least 3 since 2008 when she went into nursing home" (01/20/2013)  . Breast cancer     "right" (01/20/2013)    Past Surgical History: Past Surgical History  Procedure Laterality Date  . Joint replacement  left knee in '90s  . Breast lumpectomy Right 1990's; 2000's    "she's had 2" (01/20/2013)  . Replacement total knee bilateral Bilateral 1990's  . Tee without cardioversion N/A 02/17/2013    Procedure: TRANSESOPHAGEAL ECHOCARDIOGRAM (TEE);  Surgeon: Lars Masson, MD;  Location: [facility] ENDOSCOPY;  Service: Cardiovascular;  Laterality: N/A;    Social History: History   Social History  . Marital Status: Widowed    Spouse Name: N/A    Number of Children: N/A  . Years of Education: N/A   Social History Main Topics  . Smoking status: Never Smoker   . Smokeless tobacco: Never Used  . Alcohol Use: No  . Drug Use:  No  . Sexual Activity: No   Other Topics Concern  . Not on file   Social History Narrative   Maricela Curet Colvin Caroli is POA [phone], cell [phone]    Family History: No family history on file.  Allergies: Allergies  Allergen Reactions  . Penicillins Other (See Comments)    Per MAR  . Tetanus Toxoids Other (See Comments)    Per MAR  . Vicodin [Hydrocodone-Acetaminophen] Other (See Comments)    Per [facility]    Current Outpatient Prescriptions  Medication Sig Dispense Refill  . Amino Acids-Protein Hydrolys (FEEDING SUPPLEMENT, PRO-STAT SUGAR FREE 64,) LIQD Take 30 mLs by mouth 2 (two) times daily.      Marland Kitchen atorvastatin (LIPITOR) 20 MG tablet Take 1 tablet (20 mg total) by mouth daily.  30 tablet  0  . calcium citrate (CALCITRATE - DOSED IN MG ELEMENTAL CALCIUM) 950 MG tablet Take 200 mg of elemental calcium by mouth daily.      . cloNIDine (CATAPRES - DOSED IN MG/24 HR) 0.1 mg/24hr patch Place 0.1 mg onto the skin once a week.      . collagenase (SANTYL) ointment Apply 1 application topically daily.      Marland Kitchen dipyridamole-aspirin (AGGRENOX) 200-25 MG per 12 hr capsule Take 1 capsule by mouth 2 (two) times daily.      Marland Kitchen donepezil (ARICEPT) 10 MG tablet Take 10 mg by mouth at bedtime.      . fluticasone (FLONASE) 50 MCG/ACT nasal spray Place 1 spray into both nostrils daily.      Marland Kitchen lisinopril (ZESTRIL) 2.5 MG tablet Take 1 tablet (2.5 mg total) by mouth daily.  30 tablet  0  . polyethylene glycol powder (GLYCOLAX/MIRALAX) powder Take 17 g by mouth daily as needed for mild constipation. FOR CONSTIPATION  255 g  0  . predniSONE (DELTASONE) 20 MG tablet Take 2 tablets (40 mg total) by mouth daily with breakfast. Then 1.5 tomorrow, then 1, then 0.5 then off  10 tablet  0  . promethazine (PHENERGAN) 25 MG tablet Take 1 tablet (25 mg total) by mouth every 6 (six) hours as needed for nausea.  30 tablet  0  . ranitidine (ZANTAC) 150 MG tablet Take 1 tablet (150 mg total) by mouth every evening.  30  tablet  0  . Rivaroxaban (XARELTO) 20 MG TABS tablet Take 20 mg by mouth daily.      Marland Kitchen sulfamethoxazole-trimethoprim (BACTRIM DS) 800-160 MG per tablet Take 1 tablet by mouth every 12 (twelve) hours. For next 10 days  20 tablet  0  . Travoprost, BAK Free, (TRAVATAN) 0.004 % SOLN ophthalmic solution Place 1 drop into both eyes at bedtime.  5 mL  0  . vancomycin (VANCOCIN) 50 mg/mL oral solution Take 10 mLs (500 mg total) by mouth every 6 (  six) hours. For 4 more days  100 mL  0  . Vitamin D, Ergocalciferol, (DRISDOL) 50000 UNITS CAPS capsule Take 1 capsule (50,000 Units total) by mouth every 30 (thirty) days. Give q 15th day of each month  30 capsule  0   No current facility-administered medications for this visit.   Review Of Systems: Unable to perform review of systems secondary to patient's severe dementia.  Physical Exam:            General: African American female, lying in hospital bed, alert however does not answer questions, currently nonverbal HEENT: Normocephalic, pupils are equal in size, no scleral icterus, nasal septum midline, no rhinorrhea, dry mucous membranes, neck was supple, no anterior posterior cervical lymphadenopathy, no thyromegaly Heart: Irregular irregular rhythm, 3/6 systolic murmur, no heaves, no thrills Lungs: Clear to auscultation bilaterally, normal effort Abdomen: Obese, soft, nontender, bowel sounds present, no rebound, no guarding Extremities: Warm, 2+ pitting edema of the left lower extremity to above the knee, bilateral feet in soft floaters, Ace bandage over right knee, deformity of the right knee present consistent with previous fracture, 2+ dorsalis pedis and radial pulses Skin: No foot ulcerations noted, patient is sacral ulcer which was not visualized however was visualized earlier by the resident and noted to be stage II ulceration Neurology: Alert, unable to follow commands, exam limited by severe dementia, moving all extremities, extraocular movements  appear intact  Labs and Imaging: Lab Results  Component Value Date/Time   NA 144 02/22/2013  7:11 AM   NA 147 05/24/2012   K 4.2 02/22/2013  7:11 AM   K 4.0 01/25/2012 10:03 AM   CL 116* 02/22/2013  7:11 AM   CL 111 01/25/2012 10:03 AM   CO2 18* 02/22/2013  7:11 AM   CO2 25 01/25/2012 10:03 AM   BUN 40* 02/22/2013  7:11 AM   BUN 26* 05/24/2012   BUN 30 01/13/2012  4:27 PM   CREATININE 0.98 02/22/2013  7:11 AM   CREATININE 0.7 05/24/2012   CREATININE 0.88 01/25/2012 10:03 AM   GLUCOSE 118* 02/22/2013  7:11 AM   Lab Results  Component Value Date   WBC 10.7* 02/22/2013   HGB 8.8* 02/22/2013   HCT 28.5* 02/22/2013   MCV 86.4 02/22/2013   PLT 246 02/22/2013   Reviewed TTE and TEE performed during hospital stay - systolic function was maintained at 55-60%, no wall motion abnormalities, LVH, severe aortic valve stenosis, mild to moderate stenosis of the mitral valve, no vegetations identified and TEE   Assessment and Plan: ATIYAH BAUER is a 78 y.o. year old female admitted to [facility] after recent hospitalization to [facility] [place] for staph bacteremia, C. difficile colitis, and sepsis.  I have personally examined the patient and discussed the case with the resident physician. I agree with the resident documentation as noted.

## 2013-02-23 NOTE — Assessment & Plan Note (Addendum)
Hypotension initially in the hospital. Only restarted on lisinopril and clonidine. Will monitor vitals daily x7 days and adjust as needed.

## 2013-02-23 NOTE — Assessment & Plan Note (Signed)
Severe dementia secondary to history of CVA. -Pelvis care consult and continuing goals of care discussion with family -Given severe state of dementia would recommend discontinuing Aricept at this time as likely no benefit -Continue Aggrenox for secondary prevention of CVA -Continue statin

## 2013-02-23 NOTE — Assessment & Plan Note (Signed)
Continue nonoperative management. Ace wrap to be applied daily. Patient have regular followup with orthopedic surgery

## 2013-02-23 NOTE — Assessment & Plan Note (Signed)
Will lisinopril and clonidine as these are her discharge blood pressure medications. -Monitor blood pressure in the nursing home

## 2013-02-23 NOTE — Assessment & Plan Note (Signed)
Patient to complete 14 day course of Bactrim. End date scheduled for 03/04/2013.

## 2013-02-23 NOTE — Assessment & Plan Note (Signed)
Stable echocardiogram. No further workup at this time. Monitor clinically

## 2013-02-23 NOTE — Assessment & Plan Note (Signed)
Murmur is stable based on chart review. Echo during admission is stable from previous.

## 2013-02-23 NOTE — Assessment & Plan Note (Signed)
Severe dementia. PoA, Colvin Caroli, is speaking with palliative care about GOC. Continue aricept. Continue aggrenox.

## 2013-02-23 NOTE — Assessment & Plan Note (Signed)
Continue atorvastatin for secondary stroke prevention. Could consider stopping this depending on outcome of palliative care discussions.

## 2013-02-23 NOTE — Assessment & Plan Note (Signed)
Sister, [name], reported cough last night. Vitals are stable, lung exam unremarkable. Will monitor. Mucinex prn for cough.

## 2013-02-23 NOTE — Assessment & Plan Note (Signed)
Improved on by mouth vancomycin. Agree with resident plan to continue vancomycin during completion of course of Bactrim for staph bacteremia.

## 2013-02-23 NOTE — Assessment & Plan Note (Signed)
Continue atorvastatin for secondary prevention of CVA

## 2013-02-23 NOTE — Assessment & Plan Note (Addendum)
Blood cultures from 02/16/13 are negative. Continue bactrim to complete a 14 day course (end date 03/04/13). Encourage PO fluid intake.

## 2013-02-23 NOTE — Progress Notes (Signed)
Patient ID: [name], female   DOB: Jun 23, 1934, 78 y.o.   MRN: [id number] Family Medicine Teaching Service Nursing Home Admission History and Physical Service Pager: [phone]  Patient name: [name] Medical record number: [id number] Date of birth: April 10, 1934 Age: 78 y.o. Gender: female  Primary Care Provider: Tana Conch, MD Code Status: FULL code; confirmed with PoA 02/23/13 - she is having discussion with palliative care in [facility] about changing to DNR.  Chief Complaint: readmission to the nursing home following hospitalization  [name] is a 78 y.o. female returning to the nursing home after admission to the hospital.  On 02/12/13 she was noted to be hypotensive, weak, and less responsive than normal.  She was transferred to the [facility] ED for further evaluation.  She was found to be in septic shock and admitted to the ICU on pressors and broad spectrum antibiotics.  Work up revealed a coag neg staph bacteremia and c diff colitis.  Urine culture was negative.  Her therapy was tapered and she was able to come off pressors on 12/2.  She was evaluated for cardiac vegetations and this was negative.  Blood cultures were negative on 12/4 and she was transitioned to PO bactrim for an additional 14 days.  C diff therapy was tapered to PO vanc which she tolerated well.    On evaluation in the nursing home, she was noted to be non verbal and intermittently followed commands.  She did track with her eyes.  She did not appear to be in any distress or discomfort.  I called and spoke with [name], her sister and PoA.  [name] states that [name] is typically non verbal.  She did raise several concerns.  [name] stayed with the patient for several hours last night and noted a cough and some congestion.  She also expressed concern about pain from the right femur fracture and would like her back on something for pain control.  Her last concern was about her hydration status.  [name]  needs cuing to drink.  [name] has been having discussions with palliative care at [facility] regarding [name]'s code status.  In the hospital, she decided on DNI.  At the time on my discussion with [name], she would like the patient to remain a full code.  She would like to continue discussions about code status and GOC with the palliative RN at [facility].    Review Of Systems: unable to obtain secondary to severe dementia  Patient Active Problem List   Diagnosis Date Noted  . C. difficile colitis 02/23/2013  . Cough 02/23/2013  . Coagulase negative Staphylococcus bacteremia 02/12/2013  . Closed fracture of right femur 01/24/2013  . Stroke 01/14/2013  . Acute on chronic kidney failure 01/14/2013  . Anemia 05/25/2012  . DVT (deep venous thrombosis) 05/12/2012  . Mitral stenosis 02/15/2012  . Sacral pressure sore 02/15/2012  . At risk for diabetes mellitus 12/27/2011  . Hypertension 12/27/2011  . Aortic stenosis 12/11/2011  . CKD (chronic kidney disease) stage 2, GFR 60-89 ml/min 12/11/2011  . Breast neoplasm   . Glaucoma   . Vitamin D deficiency   . Hiatal hernia   . Obesity   . Hyperlipidemia   . Vascular dementia   . Depression   . Allergic rhinitis    Past Medical History: Past Medical History  Diagnosis Date  . Aphasia   . Muscle weakness   . Malaise   . Fatigue   .  Breast neoplasm   . Hypertension   . Glaucoma   . UTI (lower urinary tract infection)   . Vitamin D deficiency   . Hiatal hernia   . Constipation   . Diarrhea   . Asthma   . Obesity   . Hyperlipidemia   . Alzheimer's dementia   . Depression   . Allergic rhinitis   . Dementia   . DVT (deep venous thrombosis) 2014    "put her on Xeralto" (01/20/2013)  . Pneumonia 2014  . Shortness of breath     "lately; just laying there" (01/20/2013)  . Type II diabetes mellitus   . Anemia   . History of blood transfusion     "got a unit yesterday" (01/20/2013)  . Stroke 2006; 2007    residual:   "mute, dementia, generalized weakness" (01/20/2013)  . Recurrent UTI     "at least 3 since 2008 when she went into nursing home" (01/20/2013)  . Breast cancer     "right" (01/20/2013)   Past Surgical History: Past Surgical History  Procedure Laterality Date  . Joint replacement      left knee in '90s  . Breast lumpectomy Right 1990's; 2000's    "she's had 2" (01/20/2013)  . Replacement total knee bilateral Bilateral 1990's  . Tee without cardioversion N/A 02/17/2013    Procedure: TRANSESOPHAGEAL ECHOCARDIOGRAM (TEE);  Surgeon: Lars Masson, MD;  Location: [facility] ENDOSCOPY;  Service: Cardiovascular;  Laterality: N/A;   Social History: History  Substance Use Topics  . Smoking status: Never Smoker   . Smokeless tobacco: Never Used  . Alcohol Use: No   Additional social history: lives at [facility] nursing home; sister, [name], is PoA  Please also refer to relevant sections of EMR.  Family History: No family history on file. Allergies and Medications: Allergies  Allergen Reactions  . Penicillins Other (See Comments)    Per MAR  . Tetanus Toxoids Other (See Comments)    Per MAR  . Vicodin [Hydrocodone-Acetaminophen] Other (See Comments)    Per [facility]   Current Outpatient Prescriptions on File Prior to Visit  Medication Sig Dispense Refill  . Amino Acids-Protein Hydrolys (FEEDING SUPPLEMENT, PRO-STAT SUGAR FREE 64,) LIQD Take 30 mLs by mouth 2 (two) times daily.      Marland Kitchen atorvastatin (LIPITOR) 20 MG tablet Take 1 tablet (20 mg total) by mouth daily.  30 tablet  0  . calcium citrate (CALCITRATE - DOSED IN MG ELEMENTAL CALCIUM) 950 MG tablet Take 200 mg of elemental calcium by mouth daily.      . cloNIDine (CATAPRES - DOSED IN MG/24 HR) 0.1 mg/24hr patch Place 0.1 mg onto the skin once a week.      . collagenase (SANTYL) ointment Apply 1 application topically daily.      Marland Kitchen dipyridamole-aspirin (AGGRENOX) 200-25 MG per 12 hr capsule Take 1 capsule by mouth 2 (two) times daily.       Marland Kitchen donepezil (ARICEPT) 10 MG tablet Take 10 mg by mouth at bedtime.      . fluticasone (FLONASE) 50 MCG/ACT nasal spray Place 1 spray into both nostrils daily.      Marland Kitchen lisinopril (ZESTRIL) 2.5 MG tablet Take 1 tablet (2.5 mg total) by mouth daily.  30 tablet  0  . polyethylene glycol powder (GLYCOLAX/MIRALAX) powder Take 17 g by mouth daily as needed for mild constipation. FOR CONSTIPATION  255 g  0  . predniSONE (DELTASONE) 20 MG tablet Take 2 tablets (40 mg total) by mouth  daily with breakfast. Then 1.5 tomorrow, then 1, then 0.5 then off  10 tablet  0  . promethazine (PHENERGAN) 25 MG tablet Take 1 tablet (25 mg total) by mouth every 6 (six) hours as needed for nausea.  30 tablet  0  . ranitidine (ZANTAC) 150 MG tablet Take 1 tablet (150 mg total) by mouth every evening.  30 tablet  0  . Rivaroxaban (XARELTO) 20 MG TABS tablet Take 20 mg by mouth daily.      Marland Kitchen sulfamethoxazole-trimethoprim (BACTRIM DS) 800-160 MG per tablet Take 1 tablet by mouth every 12 (twelve) hours. For next 10 days  20 tablet  0  . Travoprost, BAK Free, (TRAVATAN) 0.004 % SOLN ophthalmic solution Place 1 drop into both eyes at bedtime.  5 mL  0  . vancomycin (VANCOCIN) 50 mg/mL oral solution Take 10 mLs (500 mg total) by mouth every 6 (six) hours. For 4 more days  100 mL  0  . Vitamin D, Ergocalciferol, (DRISDOL) 50000 UNITS CAPS capsule Take 1 capsule (50,000 Units total) by mouth every 30 (thirty) days. Give q 15th day of each month  30 capsule  0   No current facility-administered medications on file prior to visit.    Objective: Pulse 96  Temp(Src) 98.7 F (37.1 C)  Resp 18  SpO2 100% Exam: General: sleeping, rouses easily to name, non verbal, no apparent distress HEENT: slightly dry mucous membranes; tracks well Cardiovascular: RRR, 3/6 systolic murmur Respiratory: poor exam due to patient positioning and body habitus; intermittent abdominal breathing; good air movement; CTAB Abdomen: +BS, soft, NDNT, no  rebound or guarding Extremities: 2-3+ edema in leg leg to knee; trace in right Skin: stage II sacral decub, dressing place with some soiling; ulcer appears clean without significant drainage and no erythema Neuro: alert, non verbal, intermittently follows commands  Labs and Imaging: CBC BMET   Recent Labs Lab 02/22/13 0711  WBC 10.7*  HGB 8.8*  HCT 28.5*  PLT 246    Recent Labs Lab 02/22/13 0711  NA 144  K 4.2  CL 116*  CO2 18*  BUN 40*  CREATININE 0.98  GLUCOSE 118*  CALCIUM 7.8*     C diff: positive Blood cultures 11/30: coag neg staph x2 Blood cultures 12/4: negative Urine culture: multiple morphotypes  TTE:  - Left ventricle: The cavity size was normal. Wall thickness was increased in a pattern of moderate LVH. There was mild focal basal hypertrophy of the septum. Systolic function was normal. The estimated ejection fraction was in the range of 55% to 60%. Wall motion was normal; there were no regional wall motion abnormalities. Doppler parameters are consistent with high ventricular filling pressure. - Aortic valve: Valve mobility was restricted. There was severe stenosis. Mild regurgitation. - Mitral valve: Severely calcified annulus. Mildly thickened leaflets . The findings are consistent with mild to moderate stenosis. Mild regurgitation. - Left atrium: The atrium was severely dilated. - Pulmonary arteries: Systolic pressure was mildly increased.  TEE: no vegetations  Charm Rings, MD 02/23/2013, 9:19 AM PGY-3, [facility] FPTS Intern pager: [phone], text pages welcome

## 2013-02-23 NOTE — Assessment & Plan Note (Signed)
Difficult to assess in setting of severe dementia.  Agree with discontinuing antidepressants.

## 2013-02-23 NOTE — Assessment & Plan Note (Addendum)
Left leg. Edema present in left leg.  This will likely resolved over time. Continue xarelto 20mg  daily.

## 2013-02-23 NOTE — Assessment & Plan Note (Signed)
Did receive 1 unit pRBCs in the hospital. Stable between 8 and 9 on discharge.

## 2013-02-23 NOTE — Assessment & Plan Note (Signed)
Secondary to staph bacteremia and C. difficile colitis. Shock has resolved. -Will continue Bactrim and vancomycin

## 2013-02-23 NOTE — Assessment & Plan Note (Signed)
Stable.  Does not appear infected. Continue with local wound care.

## 2013-03-02 ENCOUNTER — Non-Acute Institutional Stay (INDEPENDENT_AMBULATORY_CARE_PROVIDER_SITE_OTHER): Payer: Medicare Other | Admitting: Family Medicine

## 2013-03-02 DIAGNOSIS — F329 Major depressive disorder, single episode, unspecified: Secondary | ICD-10-CM

## 2013-03-02 DIAGNOSIS — F32A Depression, unspecified: Secondary | ICD-10-CM

## 2013-03-02 DIAGNOSIS — Z9189 Other specified personal risk factors, not elsewhere classified: Secondary | ICD-10-CM

## 2013-03-02 DIAGNOSIS — F0150 Vascular dementia without behavioral disturbance: Secondary | ICD-10-CM

## 2013-03-02 DIAGNOSIS — N182 Chronic kidney disease, stage 2 (mild): Secondary | ICD-10-CM

## 2013-03-02 DIAGNOSIS — I359 Nonrheumatic aortic valve disorder, unspecified: Secondary | ICD-10-CM

## 2013-03-02 DIAGNOSIS — E669 Obesity, unspecified: Secondary | ICD-10-CM

## 2013-03-02 DIAGNOSIS — I10 Essential (primary) hypertension: Secondary | ICD-10-CM

## 2013-03-02 DIAGNOSIS — I82402 Acute embolism and thrombosis of unspecified deep veins of left lower extremity: Secondary | ICD-10-CM

## 2013-03-02 DIAGNOSIS — Z789 Other specified health status: Secondary | ICD-10-CM

## 2013-03-02 DIAGNOSIS — E785 Hyperlipidemia, unspecified: Secondary | ICD-10-CM

## 2013-03-02 DIAGNOSIS — A0472 Enterocolitis due to Clostridium difficile, not specified as recurrent: Secondary | ICD-10-CM

## 2013-03-02 DIAGNOSIS — E559 Vitamin D deficiency, unspecified: Secondary | ICD-10-CM

## 2013-03-02 DIAGNOSIS — I350 Nonrheumatic aortic (valve) stenosis: Secondary | ICD-10-CM

## 2013-03-02 DIAGNOSIS — R7881 Bacteremia: Secondary | ICD-10-CM

## 2013-03-02 DIAGNOSIS — B957 Other staphylococcus as the cause of diseases classified elsewhere: Secondary | ICD-10-CM

## 2013-03-02 DIAGNOSIS — I82409 Acute embolism and thrombosis of unspecified deep veins of unspecified lower extremity: Secondary | ICD-10-CM

## 2013-03-02 NOTE — Progress Notes (Signed)
Subjective:  Nursing home visit. Room 101 B Patient's care is being coordinated currently with [facility] Hal Neer, NP (last visit in October)  Since I have seen patient last, she has had a complicated course including 3 stays at [facility].   First hospitalization was from 01/14/13 to 01/19/13 for altered mental status and she was found to have UTI(treated with CIpro), AKI (improved with hydration), and anemia. MRI did not show stroke. AMS improved with transfusion of 2 units of blood (hgb initially 5.4) and holding of xarelto and aggrenox. Hgb stable at 7.7 at discharge and anticoagulants were restarted. Regarding CHF, had to receive lasix and was on 2L at discharge.   Patient readmitted 01/20/13 to 01/26/13  After patient was found to have an ecchymosis which was found to be a distal comminuted, displaced, peri-prosthetic right femur fracture with unknown mechanism of injury. Orthopedics evaluated patient and recommended non-operative care. She was placed on morphine and tylenol for pain and transitioned to roxanol 1mg  q8h on discharge and is now on tylenol.   Most recently, hospitalized from 02/12/13 to 02/22/13 including a stay on CCM/ICU service after she was found to be in septic shock requiring pressors related to Staph bacteremia. Also found to have C. Diff colitis. TTE negative for vegetations as well as TEE. Patient on course  Vancomycin both PO and IV and this was transitioned to Bactrim after blood cultures were negative x 2. ? If staph bacteremia related to stage 2 sacral ulcer-treated with collagenase cream and daily coverage.   Goals of Care After extensive discussions with palliative care, patient has become DNR/DNI. In hospital, agreed to maximize comfort care and avoid tube feeds. She will use her remaining medicare days and then be transferred to hospice care. This decision was made with sister Colvin Caroli who is the decision maker.   # CHF due to valvular disease-Aortic  Stenosis vs. Regurgitation. CKD Echocardiogram performed on 09/08/2012 showed ejection fraction of 55-60%, LVH present, mild to moderate aortic regurgitation. Aortic sclerosis without stenosis. This varies significantly from 12/07/11 echocardiogram report which states severe stenosis and mild regurgitation. Echocardiogram during hospitalization showed once again "severe stenosis. Mild regurgitation."  I did not see any weights entered since November weight of 213, there was a hospital weight of 251 but I do not believe this reflects patients true weight. Patient is on 2L I believe chronically now since her   Cr was 0.98 most recently corresponding to gfr of 63 (stage II CKD)  Hypertension/history CVA/vascular dementia BP Readings from Last 3 Encounters:  03/02/13 130/60  02/22/13 128/75  02/22/13 128/75  Hypotensive in hospital. Now only on lisinopril and clonidine. Holding amlodipine and metoprolol. On Aggrenox for history CVA.   DVT-tolerating xarelto. Patient did not have recurrence of DVT when off of xarelto but quickly formed a superficial clot and given quick occurrence of this, decision made to permanently continued unless under GOC this is changed.     ROS--Due to dementia, patient with minimal words and does not give ROS.  Past Medical History-smoking status noted: never smoker.  Reviewed problem list.  Patient Active Problem List   Diagnosis Date Noted  . Aortic stenosis 12/11/2011    Priority: High  . Vascular dementia     Priority: High  . DVT (deep venous thrombosis) 05/12/2012    Priority: Medium  . Hypertension 12/27/2011    Priority: Medium  . CKD (chronic kidney disease) stage 2, GFR 60-89 ml/min 12/11/2011    Priority: Medium  .  Breast neoplasm     Priority: Medium  . Vitamin D deficiency     Priority: Medium  . Hyperlipidemia     Priority: Medium  . Depression     Priority: Medium  . Mitral stenosis 02/15/2012    Priority: Low  . Sacral pressure sore  02/15/2012    Priority: Low  . At risk for diabetes mellitus 12/27/2011    Priority: Low  . Hiatal hernia     Priority: Low  . Obesity     Priority: Low  . Allergic rhinitis     Priority: Low  . C. difficile colitis 02/23/2013  . Cough 02/23/2013  . Coagulase negative Staphylococcus bacteremia 02/12/2013  . Closed fracture of right femur 01/24/2013  . Stroke 01/14/2013  . Acute on chronic kidney failure 01/14/2013  . Anemia 05/25/2012  . Glaucoma    Medications- reviewed and updated Current Outpatient Prescriptions on File Prior to Visit  Medication Sig Dispense Refill  . Amino Acids-Protein Hydrolys (FEEDING SUPPLEMENT, PRO-STAT SUGAR FREE 64,) LIQD Take 30 mLs by mouth 2 (two) times daily.      Marland Kitchen atorvastatin (LIPITOR) 20 MG tablet Take 1 tablet (20 mg total) by mouth daily.  30 tablet  0  . calcium citrate (CALCITRATE - DOSED IN MG ELEMENTAL CALCIUM) 950 MG tablet Take 200 mg of elemental calcium by mouth daily.      . cloNIDine (CATAPRES - DOSED IN MG/24 HR) 0.1 mg/24hr patch Place 0.1 mg onto the skin once a week.      . collagenase (SANTYL) ointment Apply 1 application topically daily.      Marland Kitchen dipyridamole-aspirin (AGGRENOX) 200-25 MG per 12 hr capsule Take 1 capsule by mouth 2 (two) times daily.      Marland Kitchen donepezil (ARICEPT) 10 MG tablet Take 10 mg by mouth at bedtime.      . fluticasone (FLONASE) 50 MCG/ACT nasal spray Place 1 spray into both nostrils daily.      Marland Kitchen lisinopril (ZESTRIL) 2.5 MG tablet Take 1 tablet (2.5 mg total) by mouth daily.  30 tablet  0  . polyethylene glycol powder (GLYCOLAX/MIRALAX) powder Take 17 g by mouth daily as needed for mild constipation. FOR CONSTIPATION  255 g  0  . predniSONE (DELTASONE) 20 MG tablet Take 2 tablets (40 mg total) by mouth daily with breakfast. Then 1.5 tomorrow, then 1, then 0.5 then off  10 tablet  0  . promethazine (PHENERGAN) 25 MG tablet Take 1 tablet (25 mg total) by mouth every 6 (six) hours as needed for nausea.  30 tablet   0  . ranitidine (ZANTAC) 150 MG tablet Take 1 tablet (150 mg total) by mouth every evening.  30 tablet  0  . Rivaroxaban (XARELTO) 20 MG TABS tablet Take 20 mg by mouth daily.      Marland Kitchen sulfamethoxazole-trimethoprim (BACTRIM DS) 800-160 MG per tablet Take 1 tablet by mouth every 12 (twelve) hours. For next 10 days  20 tablet  0  . Travoprost, BAK Free, (TRAVATAN) 0.004 % SOLN ophthalmic solution Place 1 drop into both eyes at bedtime.  5 mL  0  . vancomycin (VANCOCIN) 50 mg/mL oral solution Take 10 mLs (500 mg total) by mouth every 6 (six) hours. For 4 more days  100 mL  0  . Vitamin D, Ergocalciferol, (DRISDOL) 50000 UNITS CAPS capsule Take 1 capsule (50,000 Units total) by mouth every 30 (thirty) days. Give q 15th day of each month  30 capsule  0  No current facility-administered medications on file prior to visit.   Objective: Filed Vitals:   03/02/13 1651  BP: 130/60  Pulse: 62  Temp: 97.6 F (36.4 C)  Resp: 20    Gen: NAD, looks at me briefly then turns away and stares blankly. Does not answer any questions.  HEENT: Mucous membranes are moist. CV: RRR, unchanged 4/6 SEM at LUSB, s2 still audible Abdomen: soft/nontender/nondistended Lungs: CTAB anteriorly, no wheezes rales or rhonchi. Patient will not participate with sitting up or rolling over. Non labored breathing. On 2L [place] Ext: 2+ in left, 1+ in right; previous fracture of right femur with deformity noted. Neuro: does not follow commands, looks briefly at me as above  Assessment/Plan:  Vascular dementia -continue aggrenox and statin.  -hopeful palliative care can broach subject of taking off aricept. There is no obvious benefit but HCPOA has been resistant to change in past.   Aortic stenosis Stable on recent echo (c/w echo from September of last year with severe AS). Not an operative candidate.   Vitamin D deficiency Given fracture of unknown etiology, believe recheck would be reasonable. Will ask nursing home team to  order.   Hypertension Well controlled on lisinopril and clonidine alone after hypotension in hospital required reduction of other medications.   Hyperlipidemia Continue statin. Could consider removing this but would be more comfortable if palliative broached subject with sisster/HCPOA  DVT (deep venous thrombosis) > 6 months of therapy with continuance due to quick formation fo superficial clot off of medicine.   Depression Now off antidepressants. i will add this issue to medical history but resolve from problem list as evaluating this would be difficult to impossible.   CKD (chronic kidney disease) stage 2, GFR 60-89 ml/min Stable on last BMET with gfr of 63. Continue ace-i.   Sacral pressure sore Continue wound care through nursing home.   Obesity Not a clinically relevant diagnosis and unreasonable to expect weight loss. Will add to history but resolve from problem list.   At risk for diabetes mellitus Once again, will remove from problem list. Do not think further testing is indicated. Patient likely a hospice candidate.   Coagulase negative Staphylococcus bacteremia Patient nearing completion of antibiotics. Appears stable.   C. difficile colitis No longer on contact. Appears to have finished course of treatment.   Closed fracture of right femur Non operative candidate.

## 2013-03-03 ENCOUNTER — Encounter: Payer: Self-pay | Admitting: Family Medicine

## 2013-03-03 NOTE — Assessment & Plan Note (Signed)
Non operative candidate.

## 2013-03-03 NOTE — Assessment & Plan Note (Signed)
Continue statin. Could consider removing this but would be more comfortable if palliative broached subject with sisster/HCPOA

## 2013-03-03 NOTE — Assessment & Plan Note (Signed)
Well controlled on lisinopril and clonidine alone after hypotension in hospital required reduction of other medications.

## 2013-03-03 NOTE — Assessment & Plan Note (Signed)
-  continue aggrenox and statin.  -hopeful palliative care can broach subject of taking off aricept. There is no obvious benefit but HCPOA has been resistant to change in past.

## 2013-03-03 NOTE — Assessment & Plan Note (Signed)
No longer on contact. Appears to have finished course of treatment.

## 2013-03-03 NOTE — Assessment & Plan Note (Signed)
Patient nearing completion of antibiotics. Appears stable.

## 2013-03-03 NOTE — Assessment & Plan Note (Signed)
Stable on last BMET with gfr of 63. Continue ace-i.

## 2013-03-03 NOTE — Assessment & Plan Note (Signed)
Given fracture of unknown etiology, believe recheck would be reasonable. Will ask nursing home team to order.

## 2013-03-03 NOTE — Assessment & Plan Note (Signed)
>   6 months of therapy with continuance due to quick formation fo superficial clot off of medicine.

## 2013-03-03 NOTE — Assessment & Plan Note (Signed)
Once again, will remove from problem list. Do not think further testing is indicated. Patient likely a hospice candidate.

## 2013-03-03 NOTE — Assessment & Plan Note (Signed)
Continue wound care through nursing home.  

## 2013-03-03 NOTE — Assessment & Plan Note (Signed)
Now off antidepressants. i will add this issue to medical history but resolve from problem list as evaluating this would be difficult to impossible.

## 2013-03-03 NOTE — Assessment & Plan Note (Signed)
Not a clinically relevant diagnosis and unreasonable to expect weight loss. Will add to history but resolve from problem list.

## 2013-03-03 NOTE — Assessment & Plan Note (Addendum)
Stable on recent echo (c/w echo from September of last year with severe AS). Not an operative candidate.

## 2013-03-07 ENCOUNTER — Telehealth: Payer: Self-pay | Admitting: *Deleted

## 2013-03-07 NOTE — Telephone Encounter (Signed)
Prior authorization for Promethazine placed in MD box for completion.

## 2013-03-08 NOTE — Telephone Encounter (Signed)
Pt in Nursing home. Do not see where she comes here to be seen, do you see her there?  Bernabe Dorce, [name]

## 2013-03-08 NOTE — Telephone Encounter (Signed)
Please let patient know she would need an office visit to discuss this. There is no clear indication for why she has continued to need this medication. Once she went to hospice (which she has graduated out of), I continued to refill (had nausea during COPD exacerbation initially) for symptom management but we have not had a chance to discuss this in a while and would like to see her.   Blue team-please schedule appointment. Thanks.

## 2013-03-13 NOTE — Telephone Encounter (Signed)
Please disregard my prior message. Reviewed chart and no noted history of COPD. I think this was given symptomatically in hospital for presumed nausea (patient unable to communicate this). I have shredded PA form. Will not continue medication. Nursing home team can provide alternative if needed.

## 2013-03-22 ENCOUNTER — Encounter: Payer: Self-pay | Admitting: Family Medicine

## 2013-03-22 NOTE — Progress Notes (Signed)
Patient ID: [name], female   DOB: 09-Sep-1934, 79 y.o.   MRN: [id number]  I had a discussion with Sandra Cockayne and Narda Amber from the [place] [employer] and [facility] Licensure and Certification in regards to femur fracture sustained by Ms. Llorens in November of 2014. Provided records from [facility] on her Inpatient stay.

## 2013-03-25 ENCOUNTER — Inpatient Hospital Stay (HOSPITAL_COMMUNITY)
Admission: EM | Admit: 2013-03-25 | Discharge: 2013-03-28 | DRG: 871 | Disposition: A | Discharge status: Skilled Nursing Facility | Attending: Family Medicine | Admitting: Family Medicine

## 2013-03-25 ENCOUNTER — Inpatient Hospital Stay (HOSPITAL_COMMUNITY)

## 2013-03-25 ENCOUNTER — Emergency Department (HOSPITAL_COMMUNITY)

## 2013-03-25 ENCOUNTER — Telehealth: Payer: Self-pay | Admitting: Family Medicine

## 2013-03-25 ENCOUNTER — Encounter (HOSPITAL_COMMUNITY): Payer: Self-pay | Admitting: Emergency Medicine

## 2013-03-25 ENCOUNTER — Emergency Department (HOSPITAL_COMMUNITY)
Admission: EM | Admit: 2013-03-25 | Discharge: 2013-03-25 | Disposition: A | Discharge status: Skilled Nursing Facility | Attending: Emergency Medicine | Admitting: Emergency Medicine

## 2013-03-25 DIAGNOSIS — I82409 Acute embolism and thrombosis of unspecified deep veins of unspecified lower extremity: Secondary | ICD-10-CM

## 2013-03-25 DIAGNOSIS — F3289 Other specified depressive episodes: Secondary | ICD-10-CM | POA: Insufficient documentation

## 2013-03-25 DIAGNOSIS — Z853 Personal history of malignant neoplasm of breast: Secondary | ICD-10-CM | POA: Insufficient documentation

## 2013-03-25 DIAGNOSIS — I69920 Aphasia following unspecified cerebrovascular disease: Secondary | ICD-10-CM

## 2013-03-25 DIAGNOSIS — Z9109 Other allergy status, other than to drugs and biological substances: Secondary | ICD-10-CM | POA: Insufficient documentation

## 2013-03-25 DIAGNOSIS — N182 Chronic kidney disease, stage 2 (mild): Secondary | ICD-10-CM

## 2013-03-25 DIAGNOSIS — Z79899 Other long term (current) drug therapy: Secondary | ICD-10-CM

## 2013-03-25 DIAGNOSIS — I129 Hypertensive chronic kidney disease with stage 1 through stage 4 chronic kidney disease, or unspecified chronic kidney disease: Secondary | ICD-10-CM | POA: Diagnosis present

## 2013-03-25 DIAGNOSIS — G309 Alzheimer's disease, unspecified: Secondary | ICD-10-CM | POA: Insufficient documentation

## 2013-03-25 DIAGNOSIS — J45909 Unspecified asthma, uncomplicated: Secondary | ICD-10-CM | POA: Insufficient documentation

## 2013-03-25 DIAGNOSIS — S7291XK Unspecified fracture of right femur, subsequent encounter for closed fracture with nonunion: Secondary | ICD-10-CM

## 2013-03-25 DIAGNOSIS — Z4789 Encounter for other orthopedic aftercare: Secondary | ICD-10-CM

## 2013-03-25 DIAGNOSIS — F0280 Dementia in other diseases classified elsewhere without behavioral disturbance: Secondary | ICD-10-CM | POA: Diagnosis present

## 2013-03-25 DIAGNOSIS — L8994 Pressure ulcer of unspecified site, stage 4: Secondary | ICD-10-CM | POA: Diagnosis present

## 2013-03-25 DIAGNOSIS — E559 Vitamin D deficiency, unspecified: Secondary | ICD-10-CM | POA: Diagnosis present

## 2013-03-25 DIAGNOSIS — D649 Anemia, unspecified: Secondary | ICD-10-CM

## 2013-03-25 DIAGNOSIS — E669 Obesity, unspecified: Secondary | ICD-10-CM | POA: Diagnosis present

## 2013-03-25 DIAGNOSIS — Z86718 Personal history of other venous thrombosis and embolism: Secondary | ICD-10-CM | POA: Insufficient documentation

## 2013-03-25 DIAGNOSIS — H409 Unspecified glaucoma: Secondary | ICD-10-CM | POA: Diagnosis present

## 2013-03-25 DIAGNOSIS — F0150 Vascular dementia without behavioral disturbance: Secondary | ICD-10-CM

## 2013-03-25 DIAGNOSIS — I350 Nonrheumatic aortic (valve) stenosis: Secondary | ICD-10-CM

## 2013-03-25 DIAGNOSIS — Z418 Encounter for other procedures for purposes other than remedying health state: Secondary | ICD-10-CM

## 2013-03-25 DIAGNOSIS — E785 Hyperlipidemia, unspecified: Secondary | ICD-10-CM

## 2013-03-25 DIAGNOSIS — Z2989 Encounter for other specified prophylactic measures: Secondary | ICD-10-CM

## 2013-03-25 DIAGNOSIS — E119 Type 2 diabetes mellitus without complications: Secondary | ICD-10-CM | POA: Diagnosis present

## 2013-03-25 DIAGNOSIS — I69998 Other sequelae following unspecified cerebrovascular disease: Secondary | ICD-10-CM | POA: Insufficient documentation

## 2013-03-25 DIAGNOSIS — N390 Urinary tract infection, site not specified: Secondary | ICD-10-CM | POA: Insufficient documentation

## 2013-03-25 DIAGNOSIS — F329 Major depressive disorder, single episode, unspecified: Secondary | ICD-10-CM | POA: Insufficient documentation

## 2013-03-25 DIAGNOSIS — Z7901 Long term (current) use of anticoagulants: Secondary | ICD-10-CM

## 2013-03-25 DIAGNOSIS — R509 Fever, unspecified: Secondary | ICD-10-CM | POA: Insufficient documentation

## 2013-03-25 DIAGNOSIS — I672 Cerebral atherosclerosis: Secondary | ICD-10-CM | POA: Diagnosis present

## 2013-03-25 DIAGNOSIS — K5900 Constipation, unspecified: Secondary | ICD-10-CM | POA: Insufficient documentation

## 2013-03-25 DIAGNOSIS — A419 Sepsis, unspecified organism: Principal | ICD-10-CM

## 2013-03-25 DIAGNOSIS — B961 Klebsiella pneumoniae [K. pneumoniae] as the cause of diseases classified elsewhere: Secondary | ICD-10-CM | POA: Diagnosis present

## 2013-03-25 DIAGNOSIS — IMO0002 Reserved for concepts with insufficient information to code with codable children: Secondary | ICD-10-CM | POA: Insufficient documentation

## 2013-03-25 DIAGNOSIS — Z66 Do not resuscitate: Secondary | ICD-10-CM | POA: Diagnosis present

## 2013-03-25 DIAGNOSIS — L89109 Pressure ulcer of unspecified part of back, unspecified stage: Secondary | ICD-10-CM | POA: Diagnosis present

## 2013-03-25 DIAGNOSIS — Z6833 Body mass index (BMI) 33.0-33.9, adult: Secondary | ICD-10-CM

## 2013-03-25 DIAGNOSIS — Z8701 Personal history of pneumonia (recurrent): Secondary | ICD-10-CM | POA: Insufficient documentation

## 2013-03-25 DIAGNOSIS — E875 Hyperkalemia: Secondary | ICD-10-CM | POA: Diagnosis present

## 2013-03-25 DIAGNOSIS — I080 Rheumatic disorders of both mitral and aortic valves: Secondary | ICD-10-CM | POA: Diagnosis present

## 2013-03-25 DIAGNOSIS — Z8619 Personal history of other infectious and parasitic diseases: Secondary | ICD-10-CM | POA: Insufficient documentation

## 2013-03-25 DIAGNOSIS — Z96659 Presence of unspecified artificial knee joint: Secondary | ICD-10-CM

## 2013-03-25 DIAGNOSIS — I10 Essential (primary) hypertension: Secondary | ICD-10-CM

## 2013-03-25 DIAGNOSIS — Z880 Allergy status to penicillin: Secondary | ICD-10-CM

## 2013-03-25 DIAGNOSIS — Z862 Personal history of diseases of the blood and blood-forming organs and certain disorders involving the immune mechanism: Secondary | ICD-10-CM | POA: Insufficient documentation

## 2013-03-25 DIAGNOSIS — Z885 Allergy status to narcotic agent status: Secondary | ICD-10-CM

## 2013-03-25 DIAGNOSIS — J1100 Influenza due to unidentified influenza virus with unspecified type of pneumonia: Secondary | ICD-10-CM

## 2013-03-25 HISTORY — DX: Encounter for palliative care: Z51.5

## 2013-03-25 LAB — CBC WITH DIFFERENTIAL/PLATELET
BASOS ABS: 0 10*3/uL (ref 0.0–0.1)
Basophils Relative: 0 % (ref 0–1)
Eosinophils Absolute: 0 10*3/uL (ref 0.0–0.7)
Eosinophils Relative: 0 % (ref 0–5)
HEMATOCRIT: 34.7 % — AB (ref 36.0–46.0)
HEMOGLOBIN: 10.6 g/dL — AB (ref 12.0–15.0)
LYMPHS PCT: 26 % (ref 12–46)
Lymphs Abs: 2.8 10*3/uL (ref 0.7–4.0)
MCH: 27.4 pg (ref 26.0–34.0)
MCHC: 30.5 g/dL (ref 30.0–36.0)
MCV: 89.7 fL (ref 78.0–100.0)
MONO ABS: 0.8 10*3/uL (ref 0.1–1.0)
Monocytes Relative: 7 % (ref 3–12)
NEUTROS ABS: 7.2 10*3/uL (ref 1.7–7.7)
NEUTROS PCT: 67 % (ref 43–77)
Platelets: 206 10*3/uL (ref 150–400)
RBC: 3.87 MIL/uL (ref 3.87–5.11)
RDW: 19.4 % — ABNORMAL HIGH (ref 11.5–15.5)
WBC: 10.8 10*3/uL — ABNORMAL HIGH (ref 4.0–10.5)

## 2013-03-25 LAB — URINALYSIS, ROUTINE W REFLEX MICROSCOPIC
BILIRUBIN URINE: NEGATIVE
GLUCOSE, UA: NEGATIVE mg/dL
Ketones, ur: NEGATIVE mg/dL
Nitrite: POSITIVE — AB
PH: 5.5 (ref 5.0–8.0)
Protein, ur: 30 mg/dL — AB
Specific Gravity, Urine: 1.02 (ref 1.005–1.030)
Urobilinogen, UA: 0.2 mg/dL (ref 0.0–1.0)

## 2013-03-25 LAB — COMPREHENSIVE METABOLIC PANEL
ALT: 11 U/L (ref 0–35)
AST: 18 U/L (ref 0–37)
Albumin: 2.7 g/dL — ABNORMAL LOW (ref 3.5–5.2)
Alkaline Phosphatase: 89 U/L (ref 39–117)
BILIRUBIN TOTAL: 0.2 mg/dL — AB (ref 0.3–1.2)
BUN: 23 mg/dL (ref 6–23)
CHLORIDE: 106 meq/L (ref 96–112)
CO2: 24 meq/L (ref 19–32)
CREATININE: 0.94 mg/dL (ref 0.50–1.10)
Calcium: 8.8 mg/dL (ref 8.4–10.5)
GFR calc Af Amer: 67 mL/min — ABNORMAL LOW (ref >90)
GFR calc non Af Amer: 57 mL/min — ABNORMAL LOW (ref >90)
Glucose, Bld: 125 mg/dL — ABNORMAL HIGH (ref 70–99)
POTASSIUM: 5.8 meq/L — AB (ref 3.7–5.3)
Sodium: 142 mEq/L (ref 137–147)
Total Protein: 6.5 g/dL (ref 6.0–8.3)

## 2013-03-25 LAB — CG4 I-STAT (LACTIC ACID): Lactic Acid, Venous: 1.25 mmol/L (ref 0.5–2.2)

## 2013-03-25 LAB — TROPONIN I: Troponin I: <0.3 ng/mL (ref <0.30)

## 2013-03-25 LAB — PRO B NATRIURETIC PEPTIDE: PRO B NATRI PEPTIDE: 12213 pg/mL — AB (ref 0–450)

## 2013-03-25 LAB — URINE MICROSCOPIC-ADD ON

## 2013-03-25 MED ORDER — IPRATROPIUM-ALBUTEROL 0.5-2.5 (3) MG/3ML IN SOLN
3.0000 mL | RESPIRATORY_TRACT | Status: DC
  Administered 2013-03-26 (×3): 3 mL via RESPIRATORY_TRACT
  Filled 2013-03-25 (×3): qty 3

## 2013-03-25 MED ORDER — LEVOFLOXACIN IN D5W 750 MG/150ML IV SOLN
750.0000 mg | Freq: Once | INTRAVENOUS | Status: AC
  Administered 2013-03-25: 750 mg via INTRAVENOUS
  Filled 2013-03-25: qty 150

## 2013-03-25 MED ORDER — LEVOFLOXACIN IN D5W 750 MG/150ML IV SOLN
750.0000 mg | INTRAVENOUS | Status: DC
  Administered 2013-03-26 – 2013-03-27 (×2): 750 mg via INTRAVENOUS
  Filled 2013-03-25 (×2): qty 150

## 2013-03-25 MED ORDER — SODIUM CHLORIDE 0.9 % IV BOLUS (SEPSIS)
1000.0000 mL | Freq: Once | INTRAVENOUS | Status: AC
  Administered 2013-03-25: 1000 mL via INTRAVENOUS

## 2013-03-25 NOTE — ED Notes (Signed)
From [place], nonverbal patient. EMS reports cough and fever of 101. Given Tylenol at [facility].

## 2013-03-25 NOTE — ED Provider Notes (Signed)
CSN: [id number]     Arrival date & time 03/25/13  2038 History   First MD Initiated Contact with Patient 03/25/13 2053     Chief Complaint  Patient presents with  . Follow-up  . Urinary Tract Infection   (Consider location/radiation/quality/duration/timing/severity/associated sxs/prior Treatment) Patient is a 79 y.o. female presenting with urinary tract infection.  Urinary Tract Infection   Level 5 caveat due to dementia Pt seen in the ED earlier today for UTI. Given Levaquin and returned to [facility] where she apparently had increased fever, tachycardia and tachypnea. Sister was concerned about these changed and called on call [facility] resident who recommended she come back to the ED for admission.   Past Medical History  Diagnosis Date  . Aphasia   . Muscle weakness   . Malaise   . Fatigue   . Breast neoplasm   . Hypertension   . Glaucoma   . UTI (lower urinary tract infection)   . Vitamin D deficiency   . Hiatal hernia   . Constipation   . Diarrhea   . Asthma   . Obesity   . Hyperlipidemia   . Alzheimer's dementia   . Depression   . Allergic rhinitis   . Dementia   . DVT (deep venous thrombosis) 2014    "put her on Xeralto" (01/20/2013)  . Pneumonia 2014  . Shortness of breath     "lately; just laying there" (01/20/2013)  . Type II diabetes mellitus   . Anemia   . History of blood transfusion     "got a unit yesterday" (01/20/2013)  . Stroke 2006; 2007    residual:  "mute, dementia, generalized weakness" (01/20/2013)  . Recurrent UTI     "at least 3 since 2008 when she went into nursing home" (01/20/2013)  . Breast cancer     "right" (01/20/2013)  . Depression    Past Surgical History  Procedure Laterality Date  . Joint replacement      left knee in '90s  . Breast lumpectomy Right 1990's; 2000's    "she's had 2" (01/20/2013)  . Replacement total knee bilateral Bilateral 1990's  . Tee without cardioversion N/A 02/17/2013    Procedure: TRANSESOPHAGEAL ECHOCARDIOGRAM  (TEE);  Surgeon: Dorothy Spark, MD;  Location: [facility] ENDOSCOPY;  Service: Cardiovascular;  Laterality: N/A;   No family history on file. History  Substance Use Topics  . Smoking status: Never Smoker   . Smokeless tobacco: Never Used  . Alcohol Use: No   OB History   Grav Para Term Preterm Abortions TAB SAB Ect Mult Living                 Review of Systems Unable to assess due to mental status.   Allergies  Penicillins; Tetanus toxoids; and Vicodin  Home Medications   Current Outpatient Rx  Name  Route  Sig  Dispense  Refill  . acetaminophen (TYLENOL) 325 MG tablet   Oral   Take 650 mg by mouth every 4 (four) hours as needed for fever.         Marland Kitchen atorvastatin (LIPITOR) 20 MG tablet   Oral   Take 20 mg by mouth daily.         . calcium citrate (CALCITRATE - DOSED IN MG ELEMENTAL CALCIUM) 950 MG tablet   Oral   Take 400 mg of elemental calcium by mouth daily.          . cloNIDine (CATAPRES - DOSED IN MG/24 HR) 0.1 mg/24hr patch  Transdermal   Place 0.1 mg onto the skin once a week.         Marland Kitchen dextromethorphan-guaiFENesin (MUCINEX DM) 30-600 MG per 12 hr tablet   Oral   Take 1 tablet by mouth 2 (two) times daily.         Marland Kitchen dipyridamole-aspirin (AGGRENOX) 200-25 MG per 12 hr capsule   Oral   Take 1 capsule by mouth 2 (two) times daily.         . fluticasone (FLONASE) 50 MCG/ACT nasal spray   Each Nare   Place 1 spray into both nostrils daily.         Marland Kitchen lisinopril (PRINIVIL,ZESTRIL) 2.5 MG tablet   Oral   Take 2.5 mg by mouth daily.         . polyethylene glycol powder (GLYCOLAX/MIRALAX) powder   Oral   Take 17 g by mouth daily as needed for mild constipation. FOR CONSTIPATION         . promethazine (PHENERGAN) 25 MG tablet   Oral   Take 25 mg by mouth every 6 (six) hours as needed for nausea.         . ranitidine (ZANTAC) 150 MG tablet   Oral   Take 150 mg by mouth every evening.         . Rivaroxaban (XARELTO) 20 MG TABS tablet    Oral   Take 20 mg by mouth daily.         . Travoprost, BAK Free, (TRAVATAN) 0.004 % SOLN ophthalmic solution   Both Eyes   Place 1 drop into both eyes at bedtime.         . Vitamin D, Ergocalciferol, (DRISDOL) 50000 UNITS CAPS capsule   Oral   Take 1 capsule (50,000 Units total) by mouth every 30 (thirty) days. Give q 15th day of each month   30 capsule   0    BP 119/72  Pulse 112  Temp(Src) 100.6 F (38.1 C) (Rectal)  Resp 30  SpO2 99% Physical Exam  Nursing note and vitals reviewed. Constitutional: She appears well-developed and well-nourished.  HENT:  Head: Normocephalic and atraumatic.  Eyes: EOM are normal. Pupils are equal, round, and reactive to light.  Neck: Normal range of motion. Neck supple.  Cardiovascular: Normal rate, normal heart sounds and intact distal pulses.   Pulmonary/Chest: Effort normal. No respiratory distress. She has no wheezes. She has no rales.  Rhonchi   Abdominal: Bowel sounds are normal. She exhibits no distension. There is no tenderness.  Musculoskeletal: She exhibits edema (BLE).  Neurological: She is alert. She has normal strength. No cranial nerve deficit or sensory deficit.  Skin: Skin is warm and dry. No rash noted.  Psychiatric: She has a normal mood and affect.    ED Course  Procedures (including critical care time) Labs Review Labs Reviewed - No data to display Imaging Review Dg Chest 1 View  03/25/2013   CLINICAL DATA:  Fever  EXAM: CHEST - 1 VIEW  COMPARISON:  02/13/2013  FINDINGS: Stable cardiomegaly with central vascular congestion. Chronic mitral valve annular calcifications noted. No large effusion or pneumothorax. Atherosclerosis of the aorta. Degenerative changes of the spine and shoulders.  IMPRESSION: Cardiomegaly with vascular congestion.  Stable exam.   Electronically Signed   By: Daryll Brod M.D.   On: 03/25/2013 13:40    EKG Interpretation   None       MDM   1. UTI (urinary tract infection)      Spoke  with [place] resident, lactate added. He will come to the ED to evaluate the patient.     Charles B. Karle Starch, MD 03/25/13 2142

## 2013-03-25 NOTE — Telephone Encounter (Signed)
Called by nurse at [facility]. The nurse explains that [name] has had a fever all morning. She's been given Tylenol twice which is reduced the finger from 102.2 down to 100.6. She notes that she's also having tachycardia to 115 and tachypnea to a respiratory rate of 25-30. He states that her normal heart rate is in the 70s.  Given her vital signs of she very likely meets Sirs criteria. Based on this I advised her to send this back to the ED if she will likely need IV antibiotics. The nurse has contacted the family who states that they didn't want aggressive treatment despite her being a DNR.   Laroy Apple, MD [place] Resident, PGY-2 03/25/2013, 11:02 AM

## 2013-03-25 NOTE — ED Notes (Signed)
D/c'd from [facility] ED for UTI; no prescriptions prescribed.  Staff at [facility] decided to have pt. Return to ED for further treatment of UTI.

## 2013-03-25 NOTE — Telephone Encounter (Addendum)
After hours call  The [facility] nurse called me on the behalf of her sister.  She immediately handed the phone over to the patient's sister who is very upset. Her sister states that she is a [employer] scientist and that currently the patient's heart rate is 118, her respiratory rate is in the high 20s, and she has a fever of 103 axillary. She states that clearly her vital signs must have been different than what is documented in the note today. She also states that she was not discharged on any antibiotics.( which is incorrect as described in teh EDP's note)  I explained to her that whenever she was evaluated her sister's heart rate and respiratory rate were within normal limits. I also explained that she did not have a fever according to the note today. I explained that she was treated with one dose of IV Levaquin and discharged back to nursing facility to take additional doses of by mouth Levaquin.  I tried to explain that this morning I received a call stating that she had tachycardia, tachypnea, and fever and I felt that she would likely need inpatient treatment based on her symptoms. When she was evaluated in the ED she was afebrile and her heart and respiratory rate were within normal limits. The EDP did find evidence of UTI and treated her accordingly.  I apologized for the situation that has clearly distressed her.  I again discussed the case with the nurse at [facility]. She states that the patient has tachycardia, tachypnea, and fever of 103 as listed above. I explained that coupled with a UTI, she clearly meets sepsis criteria and would warrant inpatient treatment should the family prefer that.   Her sister initially stated that she will not take the patient back to the ER. Before getting off the phone she said that she did discuss the possibility with the nurse and decide what to do.  I gave a verbal order for Levaquin 750 mg PO for the next 4 days.    Laroy Apple, MD [place] Resident, PGY-2 03/25/2013, 7:46 PM

## 2013-03-25 NOTE — ED Notes (Addendum)
Pt arrived with a urethral catheter in place.

## 2013-03-25 NOTE — Progress Notes (Signed)
[facility] Admission History and Physical Service Pager: [phone]  Patient name: [name] Medical record number: [id number] Date of birth: 1934-10-12 Age: 79 y.o. Gender: female  Primary Care Provider: Garret Reddish, MD  Chief Complaint: Fever, UTI  Assessment and Plan: [name] is a 79 y.o. year old female presenting with sepsis from likely urinary sores. She has a sacral wound and right distal femur fracture requiring her to have an indwelling catheter chronically, which is likely the source of her UTI. Given the source of infection and her concomitant tachycardia, tachypnea, and fever sheet sepsis criteria.  Sepsis, Urinary source - Admit to telemetry for IV antibiotics and close observation. - Urine culture pending, collect blood culture - She is a BUN/creatinine ratio greater than 20, bolus x1 L in the ED and I will give her another 1200 mLs the next 12 hours. - If her clinical condition worsens consider escalating antibiotic therapy, however for now I'll treat with IV Levaquin (has penicillin allergy) - Will try to keep her antibiotics as narrow as possible considering her recent C. difficile infection.  Hyperkalemia - Moderate, potassium 5.8 on check earlier today - will treat with 15 g Kayexalate, repeat BMP in a.m. - Check EKG, telemetry  R hand pain - Considering her consistent rinsing with touching her left hand will investigate with plain film - Also check uric acid  Dyspnea- likely baseline per sister's history -  Likely chronic given her sister's history. - BNP elevated to 11,000, this is less than her last admission which was 17,000. Chest x-ray also pulmonary vascular congestion and cardiomegaly. However considering her BUN/creatinine ratio I think she is intravascularly depleted. - Consider PE, especially considering her previous DVT and rapid development of superficial thrombosis, however she is on treatment xarelto currently with  good compliance.  - Will consider acute cardiac syndrome and cycle troponins.  Sacral wound - Does not appear infected, will ask wound care team to eval and treat  Vascular dementia - Continue Aggrenox and Lipitor - Continue dysphagia 1 diet and thin liquids from previous SLP evaluation  HTN - Normotensive currently - Hold Ace inhibitor and clonidine - Plan to restart clonidine first as I feel she is volume depleted and continuing the Ace would run the risk of causing AKI  CKD2 - IV fluids per above - Avoid nephrotoxins  R distal femur fracture - Continue wrapping her sister's reported orthopedic recommendations  FEN/GI: 1 L bolus ordered in ED, IV NS at 100/hr for 12 hours Prophylaxis: On Xarelto, Pepcid Disposition: tele for close observation and  Code Status: DNR  History of Present Illness: [name] is a 79 y.o. year old female presenting with fever that started last night. She is a resident of [facility] and they state that she had a fever last night. His morning I received a call and was told that her fever was 102.2, and she also had tachycardia and tachypnea at which time I advised her to go to the ED. She was sent and evaluated, and had a UTI, and treated with IV antibiotics. She was discharged back to [facility] but developed another fever, tachycardia, and tachypnea. I was called again considering her symptoms and source of infection advised him to return to the ED for admission. Her fever it [facility] was 102.2 axillary, she was given Tylenol and sent to the ED. In the ED her rectal temp was found to be 100.6.  Her daughter explains that  she has a history of multiple strokes and has been aphasic for some time. She states that she's noticed that the patient has had noisy breathing, described as congestion and cough, since her last discharge.  She's had 3 admissions in the last 2 months. The first was for altered metal status when she was found to  have a UTI. She was then readmitted with a distal right femur fracture that is going to be nonoperative. Her third admission was in the ICU for septic shock related to staph bacteremia. She developed C. difficile at that time. She has had negative blood cultures since and her daughter denies any reports of diarrhea.  Her daughter also points out that she's been rinsing every time she touches her left hand. Tonight is the first time she's noticed that.  Level V caviar applies due to severe dementia.  Patient Active Problem List   Diagnosis Date Noted  . UTI (urinary tract infection) 03/25/2013  . C. difficile colitis 02/23/2013  . Coagulase negative Staphylococcus bacteremia 02/12/2013  . Closed fracture of right femur 01/24/2013  . Anemia 05/25/2012  . DVT (deep venous thrombosis) 05/12/2012  . Mitral stenosis 02/15/2012  . Sacral pressure sore 02/15/2012  . Hypertension 12/27/2011  . Aortic stenosis 12/11/2011  . CKD (chronic kidney disease) stage 2, GFR 60-89 ml/min 12/11/2011  . Breast neoplasm   . Glaucoma   . Vitamin D deficiency   . Hiatal hernia   . Hyperlipidemia   . Vascular dementia   . Allergic rhinitis    Past Medical History: Past Medical History  Diagnosis Date  . Aphasia   . Muscle weakness   . Malaise   . Fatigue   . Breast neoplasm   . Hypertension   . Glaucoma   . UTI (lower urinary tract infection)   . Vitamin D deficiency   . Hiatal hernia   . Constipation   . Diarrhea   . Asthma   . Obesity   . Hyperlipidemia   . Alzheimer's dementia   . Depression   . Allergic rhinitis   . Dementia   . DVT (deep venous thrombosis) 2014    "put her on Xeralto" (01/20/2013)  . Pneumonia 2014  . Shortness of breath     "lately; just laying there" (01/20/2013)  . Type II diabetes mellitus   . Anemia   . History of blood transfusion     "got a unit yesterday" (01/20/2013)  . Stroke 2006; 2007    residual:  "mute, dementia, generalized weakness" (01/20/2013)  .  Recurrent UTI     "at least 3 since 2008 when she went into nursing home" (01/20/2013)  . Breast cancer     "right" (01/20/2013)  . Depression    Past Surgical History: Past Surgical History  Procedure Laterality Date  . Joint replacement      left knee in '90s  . Breast lumpectomy Right 1990's; 2000's    "she's had 2" (01/20/2013)  . Replacement total knee bilateral Bilateral 1990's  . Tee without cardioversion N/A 02/17/2013    Procedure: TRANSESOPHAGEAL ECHOCARDIOGRAM (TEE);  Surgeon: Dorothy Spark, MD;  Location: [facility] ENDOSCOPY;  Service: Cardiovascular;  Laterality: N/A;   Social History: History  Substance Use Topics  . Smoking status: Never Smoker   . Smokeless tobacco: Never Used  . Alcohol Use: No   For any additional social history documentation, please refer to relevant sections of EMR.  Family History: No family history on file. Allergies: Allergies  Allergen  Reactions  . Penicillins Other (See Comments)    Per MAR  . Tetanus Toxoids Other (See Comments)    Per MAR  . Vicodin [Hydrocodone-Acetaminophen] Other (See Comments)    Per MAR   No current facility-administered medications on file prior to encounter.   Current Outpatient Prescriptions on File Prior to Encounter  Medication Sig Dispense Refill  . acetaminophen (TYLENOL) 325 MG tablet Take 650 mg by mouth every 4 (four) hours as needed for fever.      Marland Kitchen atorvastatin (LIPITOR) 20 MG tablet Take 20 mg by mouth daily.      . calcium citrate (CALCITRATE - DOSED IN MG ELEMENTAL CALCIUM) 950 MG tablet Take 400 mg of elemental calcium by mouth daily.       . cloNIDine (CATAPRES - DOSED IN MG/24 HR) 0.1 mg/24hr patch Place 0.1 mg onto the skin once a week.      Marland Kitchen dextromethorphan-guaiFENesin (MUCINEX DM) 30-600 MG per 12 hr tablet Take 1 tablet by mouth 2 (two) times daily.      Marland Kitchen dipyridamole-aspirin (AGGRENOX) 200-25 MG per 12 hr capsule Take 1 capsule by mouth 2 (two) times daily.      . fluticasone  (FLONASE) 50 MCG/ACT nasal spray Place 1 spray into both nostrils daily.      Marland Kitchen lisinopril (PRINIVIL,ZESTRIL) 2.5 MG tablet Take 2.5 mg by mouth daily.      . polyethylene glycol powder (GLYCOLAX/MIRALAX) powder Take 17 g by mouth daily as needed for mild constipation. FOR CONSTIPATION      . promethazine (PHENERGAN) 25 MG tablet Take 25 mg by mouth every 6 (six) hours as needed for nausea.      . ranitidine (ZANTAC) 150 MG tablet Take 150 mg by mouth every evening.      . Rivaroxaban (XARELTO) 20 MG TABS tablet Take 20 mg by mouth daily.      . Travoprost, BAK Free, (TRAVATAN) 0.004 % SOLN ophthalmic solution Place 1 drop into both eyes at bedtime.      . Vitamin D, Ergocalciferol, (DRISDOL) 50000 UNITS CAPS capsule Take 1 capsule (50,000 Units total) by mouth every 30 (thirty) days. Give q 15th day of each month  30 capsule  0   Review Of Systems: Per HPI , level V caveat applies due to severe dementia  Physical Exam: BP 119/72  Pulse 112  Temp(Src) 100.6 F (38.1 C) (Rectal)  Resp 30  SpO2 99% Exam: Gen: NAD, alert, nonverbal HEENT: NCAT, PERRL, MMM CV: RRR, distant heart sounds Resp: Expiratory wheezes throughout, mild increased work of breathing Abd: SNTND, BS present, no guarding or organomegaly Ext: Left lower extremity with 2-3+ edema, right lower extremity wrapped with cotton and Coban from above the knee to the ankle. Wrapping taken down to reveal no lesions, erythema, or edema. 2+ DP pulses bilaterally Neuro: Alert, nonverbal, unable to plantar flex feet bilaterally, 2-3/5 strength in bilateral upper extremities Msk: Slight erythema over the third and distal metacarpal, consistent wincing with palpation in the same area and squeezing of her left hand. Skin: Sacral wound covered in dry bandage with minimal serosanguineous drainage. Taken down to reveal approximately 3 cm circular lesion with smaller circular area that extends approximately 1 cm deep, red beefy base, some  extension of pink tissue just above the described wound.    Labs and Imaging: CBC BMET   Recent Labs Lab 03/25/13 1213  WBC 10.8*  HGB 10.6*  HCT 34.7*  PLT 206    Recent Labs Lab  03/25/13 1213  NA 142  K 5.8*  CL 106  CO2 24  BUN 23  CREATININE 0.94  GLUCOSE 125*  CALCIUM 8.8     proBNP : 12,213 Lactic acid: 1.25  UA:  03/25/2013 12:30  Color, Urine YELLOW  APPearance CLOUDY (A)  Specific Gravity, Urine 1.020  pH 5.5  Glucose NEGATIVE  Bilirubin Urine NEGATIVE  Ketones, ur NEGATIVE  Protein 30 (A)  Urobilinogen, UA 0.2  Nitrite POSITIVE (A)  Leukocytes, UA MODERATE (A)  Hgb urine dipstick MODERATE (A)  WBC, UA 21-50  RBC / HPF 3-6  Squamous Epithelial / LPF RARE  Bacteria, UA MANY (A)   CXR 03/25/2012 IMPRESSION:  Cardiomegaly with vascular congestion. Stable exam.  Kenn File, MD 03/25/2013, 10:40 PM

## 2013-03-25 NOTE — ED Provider Notes (Addendum)
CSN: [id number]     Arrival date & time 03/25/13  1137 History   First MD Initiated Contact with Patient 03/25/13 1204     Chief Complaint  Patient presents with  . Fever   (Consider location/radiation/quality/duration/timing/severity/associated sxs/prior Treatment) HPI Comments: Patient is a 79 year old female with past medical history of hypertension, renal insufficiency, CVA. She is a resident of [facility]. She was sent here for evaluation of fever. The staff there has recognized a cough however no additional symptoms that would explain this. She does have an indwelling Foley catheter.  Patient is nonverbal from prior CVA and adds little history to to this.  Patient is a 79 y.o. female presenting with fever. The history is provided by the patient, the nursing home and the EMS personnel.  Fever Temp source:  Rectal Severity:  Moderate Onset quality:  Gradual Duration:  1 day Timing:  Constant Progression:  Resolved Chronicity:  New Relieved by:  Nothing Worsened by:  Nothing tried Ineffective treatments:  None tried   Past Medical History  Diagnosis Date  . Aphasia   . Muscle weakness   . Malaise   . Fatigue   . Breast neoplasm   . Hypertension   . Glaucoma   . UTI (lower urinary tract infection)   . Vitamin D deficiency   . Hiatal hernia   . Constipation   . Diarrhea   . Asthma   . Obesity   . Hyperlipidemia   . Alzheimer's dementia   . Depression   . Allergic rhinitis   . Dementia   . DVT (deep venous thrombosis) 2014    "put her on Xeralto" (01/20/2013)  . Pneumonia 2014  . Shortness of breath     "lately; just laying there" (01/20/2013)  . Type II diabetes mellitus   . Anemia   . History of blood transfusion     "got a unit yesterday" (01/20/2013)  . Stroke 2006; 2007    residual:  "mute, dementia, generalized weakness" (01/20/2013)  . Recurrent UTI     "at least 3 since 2008 when she went into nursing home" (01/20/2013)  . Breast cancer    "right" (01/20/2013)  . Depression    Past Surgical History  Procedure Laterality Date  . Joint replacement      left knee in '90s  . Breast lumpectomy Right 1990's; 2000's    "she's had 2" (01/20/2013)  . Replacement total knee bilateral Bilateral 1990's  . Tee without cardioversion N/A 02/17/2013    Procedure: TRANSESOPHAGEAL ECHOCARDIOGRAM (TEE);  Surgeon: Dorothy Spark, MD;  Location: [facility] ENDOSCOPY;  Service: Cardiovascular;  Laterality: N/A;   History reviewed. No pertinent family history. History  Substance Use Topics  . Smoking status: Never Smoker   . Smokeless tobacco: Never Used  . Alcohol Use: No   OB History   Grav Para Term Preterm Abortions TAB SAB Ect Mult Living                 Review of Systems  Unable to perform ROS Constitutional: Positive for fever.    Allergies  Penicillins; Tetanus toxoids; and Vicodin  Home Medications   Current Outpatient Rx  Name  Route  Sig  Dispense  Refill  . acetaminophen (TYLENOL) 325 MG tablet   Oral   Take 650 mg by mouth every 4 (four) hours as needed for fever.         Marland Kitchen atorvastatin (LIPITOR) 20 MG tablet   Oral   Take 20  mg by mouth daily.         . calcium citrate (CALCITRATE - DOSED IN MG ELEMENTAL CALCIUM) 950 MG tablet   Oral   Take 400 mg of elemental calcium by mouth daily.          . cloNIDine (CATAPRES - DOSED IN MG/24 HR) 0.1 mg/24hr patch   Transdermal   Place 0.1 mg onto the skin once a week.         Marland Kitchen dextromethorphan-guaiFENesin (MUCINEX DM) 30-600 MG per 12 hr tablet   Oral   Take 1 tablet by mouth 2 (two) times daily.         Marland Kitchen dipyridamole-aspirin (AGGRENOX) 200-25 MG per 12 hr capsule   Oral   Take 1 capsule by mouth 2 (two) times daily.         . fluticasone (FLONASE) 50 MCG/ACT nasal spray   Each Nare   Place 1 spray into both nostrils daily.         Marland Kitchen lisinopril (PRINIVIL,ZESTRIL) 2.5 MG tablet   Oral   Take 2.5 mg by mouth daily.         . ranitidine (ZANTAC)  150 MG tablet   Oral   Take 150 mg by mouth every evening.         . Rivaroxaban (XARELTO) 20 MG TABS tablet   Oral   Take 20 mg by mouth daily.         . Travoprost, BAK Free, (TRAVATAN) 0.004 % SOLN ophthalmic solution   Both Eyes   Place 1 drop into both eyes at bedtime.         . polyethylene glycol powder (GLYCOLAX/MIRALAX) powder   Oral   Take 17 g by mouth daily as needed for mild constipation. FOR CONSTIPATION         . promethazine (PHENERGAN) 25 MG tablet   Oral   Take 25 mg by mouth every 6 (six) hours as needed for nausea.         . Vitamin D, Ergocalciferol, (DRISDOL) 50000 UNITS CAPS capsule   Oral   Take 1 capsule (50,000 Units total) by mouth every 30 (thirty) days. Give q 15th day of each month   30 capsule   0    BP 129/90  Pulse 58  Temp(Src) 99.7 F (37.6 C) (Oral)  Resp 24  Ht 5\' 9"  (1.753 m)  Wt 222 lb 10.6 oz (100.999 kg)  BMI 32.87 kg/m2  SpO2 96% Physical Exam  Nursing note and vitals reviewed. Constitutional: She is oriented to person, place, and time.  Patient is a 79 year old elderly female who appears chronically ill. She makes eye contact but is unable to respond verbally. She appears in no acute distress.  HENT:  Head: Normocephalic and atraumatic.  Mouth/Throat: Oropharynx is clear and moist.  Eyes: Pupils are equal, round, and reactive to light.  Neck: Normal range of motion. Neck supple.  Cardiovascular: Normal rate, regular rhythm and normal heart sounds.   No murmur heard. Pulmonary/Chest: Effort normal and breath sounds normal. No respiratory distress. She has no wheezes.  Abdominal: Soft. Bowel sounds are normal. She exhibits no distension. There is no tenderness.  Musculoskeletal: Normal range of motion. She exhibits edema.  There is 1+ bilateral lower extremity edema present.  Lymphadenopathy:    She has no cervical adenopathy.  Neurological: She is alert and oriented to person, place, and time.  Skin: Skin is  warm and dry.    ED Course  Procedures (  including critical care time) Labs Review Labs Reviewed  CBC WITH DIFFERENTIAL - Abnormal; Notable for the following:    WBC 10.8 (*)    Hemoglobin 10.6 (*)    HCT 34.7 (*)    RDW 19.4 (*)    All other components within normal limits  COMPREHENSIVE METABOLIC PANEL - Abnormal; Notable for the following:    Potassium 5.8 (*)    Glucose, Bld 125 (*)    Albumin 2.7 (*)    Total Bilirubin 0.2 (*)    GFR calc non Af Amer 57 (*)    GFR calc Af Amer 67 (*)    All other components within normal limits  PRO B NATRIURETIC PEPTIDE - Abnormal; Notable for the following:    Pro B Natriuretic peptide (BNP) 12213.0 (*)    All other components within normal limits  URINALYSIS, ROUTINE W REFLEX MICROSCOPIC - Abnormal; Notable for the following:    APPearance CLOUDY (*)    Hgb urine dipstick MODERATE (*)    Protein, ur 30 (*)    Nitrite POSITIVE (*)    Leukocytes, UA MODERATE (*)    All other components within normal limits  URINE MICROSCOPIC-ADD ON - Abnormal; Notable for the following:    Bacteria, UA MANY (*)    All other components within normal limits  URINE CULTURE  TROPONIN I   Imaging Review Dg Chest 1 View  03/25/2013   CLINICAL DATA:  Fever  EXAM: CHEST - 1 VIEW  COMPARISON:  02/13/2013  FINDINGS: Stable cardiomegaly with central vascular congestion. Chronic mitral valve annular calcifications noted. No large effusion or pneumothorax. Atherosclerosis of the aorta. Degenerative changes of the spine and shoulders.  IMPRESSION: Cardiomegaly with vascular congestion.  Stable exam.   Electronically Signed   By: Daryll Brod M.D.   On: 03/25/2013 13:40      MDM  No diagnosis found. Patient is a 79 year old female sent from [facility] for evaluation of fever. They report she has had a cough however chest x-ray does not reveal a pneumonia. Urinalysis does reveal evidence for UTI she has a chronically indwelling Foley. Suspect this is likely the  source of her fever. She was given Levaquin and will be discharged with the same once this infusion is complete. The remainder the workup is essentially unremarkable with the exception of an elevated BNP which is consistent with her baseline.     Veryl Speak, MD 03/25/13 1555  Veryl Speak, MD 04/03/13 [phone]

## 2013-03-25 NOTE — ED Notes (Signed)
Patient transported to X-ray 

## 2013-03-25 NOTE — Discharge Instructions (Signed)
Levaquin 750 mg daily for the next 4 days.  Return to the emergency department if you develop new or concerning symptoms.   Fever, Adult A fever is a higher than normal body temperature. In an adult, an oral temperature around 98.6 F (37 C) is considered normal. A temperature of 100.4 F (38 C) or higher is generally considered a fever. Mild or moderate fevers generally have no long-term effects and often do not require treatment. Extreme fever (greater than or equal to 106 F or 41.1 C) can cause seizures. The sweating that may occur with repeated or prolonged fever may cause dehydration. Elderly people can develop confusion during a fever. A measured temperature can vary with:  Age.  Time of day.  Method of measurement (mouth, underarm, rectal, or ear). The fever is confirmed by taking a temperature with a thermometer. Temperatures can be taken different ways. Some methods are accurate and some are not.  An oral temperature is used most commonly. Electronic thermometers are fast and accurate.  An ear temperature will only be accurate if the thermometer is positioned as recommended by the manufacturer.  A rectal temperature is accurate and done for those adults who have a condition where an oral temperature cannot be taken.  An underarm (axillary) temperature is not accurate and not recommended. Fever is a symptom, not a disease.  CAUSES   Infections commonly cause fever.  Some noninfectious causes for fever include:  Some arthritis conditions.  Some thyroid or adrenal gland conditions.  Some immune system conditions.  Some types of cancer.  A medicine reaction.  High doses of certain street drugs such as methamphetamine.  Dehydration.  Exposure to high outside or room temperatures.  Occasionally, the source of a fever cannot be determined. This is sometimes called a "fever of unknown origin" (FUO).  Some situations may lead to a temporary rise in body temperature  that may go away on its own. Examples are:  Childbirth.  Surgery.  Intense exercise. HOME CARE INSTRUCTIONS   Take appropriate medicines for fever. Follow dosing instructions carefully. If you use acetaminophen to reduce the fever, be careful to avoid taking other medicines that also contain acetaminophen. Do not take aspirin for a fever if you are younger than age 43. There is an association with Reye's syndrome. Reye's syndrome is a rare but potentially deadly disease.  If an infection is present and antibiotics have been prescribed, take them as directed. Finish them even if you start to feel better.  Rest as needed.  Maintain an adequate fluid intake. To prevent dehydration during an illness with prolonged or recurrent fever, you may need to drink extra fluid.Drink enough fluids to keep your urine clear or pale yellow.  Sponging or bathing with room temperature water may help reduce body temperature. Do not use ice water or alcohol sponge baths.  Dress comfortably, but do not over-bundle. SEEK MEDICAL CARE IF:   You are unable to keep fluids down.  You develop vomiting or diarrhea.  You are not feeling at least partly better after 3 days.  You develop new symptoms or problems. SEEK IMMEDIATE MEDICAL CARE IF:   You have shortness of breath or trouble breathing.  You develop excessive weakness.  You are dizzy or you faint.  You are extremely thirsty or you are making little or no urine.  You develop new pain that was not there before (such as in the head, neck, chest, back, or abdomen).  You have persistant vomiting and diarrhea  for more than 1 to 2 days.  You develop a stiff neck or your eyes become sensitive to light.  You develop a skin rash.  You have a fever or persistent symptoms for more than 2 to 3 days.  You have a fever and your symptoms suddenly get worse. MAKE SURE YOU:   Understand these instructions.  Will watch your condition.  Will get help  right away if you are not doing well or get worse. Document Released: 08/26/2000 Document Revised: 05/25/2011 Document Reviewed: 01/01/2011 [facility] Patient Information 2014 [place], [place].

## 2013-03-26 ENCOUNTER — Encounter (HOSPITAL_COMMUNITY): Payer: Self-pay

## 2013-03-26 DIAGNOSIS — N390 Urinary tract infection, site not specified: Secondary | ICD-10-CM

## 2013-03-26 DIAGNOSIS — A419 Sepsis, unspecified organism: Secondary | ICD-10-CM

## 2013-03-26 DIAGNOSIS — J1100 Influenza due to unidentified influenza virus with unspecified type of pneumonia: Secondary | ICD-10-CM | POA: Diagnosis present

## 2013-03-26 LAB — TROPONIN I
Troponin I: <0.3 ng/mL (ref <0.30)
Troponin I: <0.3 ng/mL (ref <0.30)

## 2013-03-26 LAB — CBC
HEMATOCRIT: 32.5 % — AB (ref 36.0–46.0)
Hemoglobin: 10 g/dL — ABNORMAL LOW (ref 12.0–15.0)
MCH: 27.5 pg (ref 26.0–34.0)
MCHC: 30.8 g/dL (ref 30.0–36.0)
MCV: 89.5 fL (ref 78.0–100.0)
Platelets: 182 10*3/uL (ref 150–400)
RBC: 3.63 MIL/uL — ABNORMAL LOW (ref 3.87–5.11)
RDW: 19.4 % — AB (ref 11.5–15.5)
WBC: 6 10*3/uL (ref 4.0–10.5)

## 2013-03-26 LAB — BASIC METABOLIC PANEL
BUN: 23 mg/dL (ref 6–23)
CALCIUM: 8.6 mg/dL (ref 8.4–10.5)
CHLORIDE: 108 meq/L (ref 96–112)
CO2: 22 meq/L (ref 19–32)
CREATININE: 0.94 mg/dL (ref 0.50–1.10)
GFR calc non Af Amer: 57 mL/min — ABNORMAL LOW (ref >90)
GFR, EST AFRICAN AMERICAN: 67 mL/min — AB (ref >90)
Glucose, Bld: 85 mg/dL (ref 70–99)
Potassium: 5 mEq/L (ref 3.7–5.3)
Sodium: 143 mEq/L (ref 137–147)

## 2013-03-26 LAB — MRSA PCR SCREENING: MRSA by PCR: NEGATIVE

## 2013-03-26 LAB — INFLUENZA PANEL BY PCR (TYPE A & B)
H1N1 flu by pcr: NOT DETECTED
Influenza A By PCR: POSITIVE — AB
Influenza B By PCR: NEGATIVE

## 2013-03-26 LAB — URIC ACID: Uric Acid, Serum: 6.5 mg/dL (ref 2.4–7.0)

## 2013-03-26 MED ORDER — ASPIRIN-DIPYRIDAMOLE ER 25-200 MG PO CP12
1.0000 | ORAL_CAPSULE | Freq: Two times a day (BID) | ORAL | Status: DC
  Administered 2013-03-26 – 2013-03-28 (×5): 1 via ORAL
  Filled 2013-03-26 (×7): qty 1

## 2013-03-26 MED ORDER — IPRATROPIUM-ALBUTEROL 0.5-2.5 (3) MG/3ML IN SOLN
3.0000 mL | Freq: Three times a day (TID) | RESPIRATORY_TRACT | Status: DC
  Administered 2013-03-27: 3 mL via RESPIRATORY_TRACT
  Filled 2013-03-26: qty 3

## 2013-03-26 MED ORDER — LATANOPROST 0.005 % OP SOLN
1.0000 [drp] | Freq: Every day | OPHTHALMIC | Status: DC
  Administered 2013-03-27: 1 [drp] via OPHTHALMIC
  Filled 2013-03-26: qty 2.5

## 2013-03-26 MED ORDER — RIVAROXABAN 20 MG PO TABS
20.0000 mg | ORAL_TABLET | Freq: Every day | ORAL | Status: DC
  Administered 2013-03-26 – 2013-03-28 (×3): 20 mg via ORAL
  Filled 2013-03-26 (×3): qty 1

## 2013-03-26 MED ORDER — FAMOTIDINE 20 MG PO TABS
20.0000 mg | ORAL_TABLET | Freq: Every day | ORAL | Status: DC
  Administered 2013-03-26 – 2013-03-27 (×2): 20 mg via ORAL
  Filled 2013-03-26 (×3): qty 1

## 2013-03-26 MED ORDER — SODIUM CHLORIDE 0.9 % IV SOLN
INTRAVENOUS | Status: AC
Start: 2013-03-26 — End: 2013-03-26

## 2013-03-26 MED ORDER — SODIUM CHLORIDE 0.9 % IJ SOLN
3.0000 mL | Freq: Two times a day (BID) | INTRAMUSCULAR | Status: DC
  Administered 2013-03-26 (×3): 3 mL via INTRAVENOUS

## 2013-03-26 MED ORDER — ACETAMINOPHEN 325 MG PO TABS
650.0000 mg | ORAL_TABLET | Freq: Four times a day (QID) | ORAL | Status: DC | PRN
  Administered 2013-03-27: 650 mg via ORAL
  Filled 2013-03-26 (×2): qty 2

## 2013-03-26 MED ORDER — SODIUM CHLORIDE 0.9 % IV SOLN
INTRAVENOUS | Status: DC
  Administered 2013-03-26 – 2013-03-28 (×5): via INTRAVENOUS

## 2013-03-26 MED ORDER — OSELTAMIVIR PHOSPHATE 75 MG PO CAPS
75.0000 mg | ORAL_CAPSULE | Freq: Two times a day (BID) | ORAL | Status: DC
  Administered 2013-03-26 – 2013-03-28 (×5): 75 mg via ORAL
  Filled 2013-03-26 (×6): qty 1

## 2013-03-26 MED ORDER — ACETAMINOPHEN 650 MG RE SUPP: 650.0000 mg | Freq: Four times a day (QID) | RECTAL | Status: DC | PRN

## 2013-03-26 MED ORDER — ALBUTEROL SULFATE (2.5 MG/3ML) 0.083% IN NEBU
2.5000 mg | INHALATION_SOLUTION | RESPIRATORY_TRACT | Status: DC | PRN
  Administered 2013-03-27 – 2013-03-28 (×3): 2.5 mg via RESPIRATORY_TRACT
  Filled 2013-03-26 (×3): qty 3

## 2013-03-26 MED ORDER — ATORVASTATIN CALCIUM 20 MG PO TABS
20.0000 mg | ORAL_TABLET | Freq: Every day | ORAL | Status: DC
Start: 2013-03-26 — End: 2013-03-28
  Administered 2013-03-26 – 2013-03-28 (×3): 20 mg via ORAL
  Filled 2013-03-26 (×3): qty 1

## 2013-03-26 MED ORDER — PROMETHAZINE HCL 25 MG PO TABS: 25.0000 mg | ORAL_TABLET | Freq: Four times a day (QID) | ORAL | Status: DC | PRN

## 2013-03-26 NOTE — Progress Notes (Signed)
[name] [id number] Admitted to 8M03: 03/26/2013 2:08 AM Attending Provider: Andrena Mews, MD    History:  obtained from sibling/ Power of Attorney [facility])  Pt and family oriented to unit, room and routine. Pt is nonverbal, arousable. Information packet given to power of attorney and safety video watched.  Admission INP armband ID verified with power of attorney and in place. Side rails in place, fall risk assessment complete with patient verbalizing understanding of risks associated with falls. Family states an understanding of how to use the call bell and to call for help before getting out of bed.   Will cont to monitor and assist as needed.  Velora Mediate, RN 03/26/2013 2:08 AM

## 2013-03-26 NOTE — Consult Note (Signed)
WOC wound consult note Reason for Consult: Consult requested for sacral wound.  Pt familiar to [facility] team from previous admission; refer to progress notes from 11/30. 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 Drainage (amount, consistency, odor) Mod amt yellow drainage, no odor Periwound: Intact skin surrounding Dressing procedure/placement/frequency: Pt has multiple systemic factors which can impair healing including frequently incontinent of stool, and immobile. Aquacel to provide antimicrobial benefits and absorb drainage.  Air mattress to reduce pressure. Foam dressing to protect from soiling. No family members present and pt does not appear to understand when discussed pressure ulcer etiology, topical treatment, and preventive measures. Please re-consult if further assistance is needed.  Thank-you,  Julien Girt MSN, [place], [place], [place], [place]

## 2013-03-26 NOTE — Progress Notes (Signed)
FMTS Attending Admission Note:  Kehinde Eniola,MD  I have seen and examined this patient, reviewed their chart. I have discussed this patient with the resident. I agree with the resident's findings, assessment and care plan.  Patient non-communicative,I could not obtain hx from her today. Chart reviewed.    Filed Vitals:     03/26/13 0208  03/26/13 0517  03/26/13 0815  03/26/13 1408    BP:  135/73  133/85      Pulse:  99  96      Temp:  98.7 F (37.1 C)  98.9 F (37.2 C)      TempSrc:  Axillary  Oral      Resp:  20  20      Weight:   222 lb 10.6 oz (100.999 kg)      SpO2:  100%  100%  98%  98%    Exam:  Gen: Calm in bed in very mild respiratory distress.Sleeping in bed.  Neuro: Not participating in exam.  Resp: Air entry equal B/L,no wheezing,again exam limited.  CV: S1 S2 normal,no murmurs.  ABd: benign.  Ext: Right LL rapped in ace bandage with B/L SCD.  A/P: 79 Y/O F with  1. Fever,Hypotension and tachy cardia/Met SIRS criteria for sepsis.  Obtain urine and blood culture.  Patient in a residential home,with increase risk for influenza,POCT influenza pending.  Place on droplet precaution.  Patient was started on Levaquin due to penicillin allergy.  Upon reviewing her record she had been on Aztreonam and Vanc in the past without allergy documented.  Plan to escalate A/B if no improvement on Levaquin.  2. UTI: Urine cx pending,continue Levaquin.  3. Hyperkalemia: S/P Kayexalate,plan to follow up BMet today.  4.ProBNP elevation/HTN: No documentation of CHF in her PMX.  Does not seem to be fluid overload at this time.  Patient on Aggrenox and Lipitor currently.  Consider betablocker addition if HR and blood pressure is running high,but was hypotensive on admission.  Echo done Dec 2014 reviewed. EF adequate.  COntinue to monitor for improvement.         

## 2013-03-26 NOTE — Progress Notes (Addendum)
CCMD called to notify that patient has new rhythm of accelerated junctional tachy. Pt is in room resting with eyes closed, asymptomatic without pain or distress. Vital signs stable at this time.   Filed Vitals:   03/26/13 2041  BP: 123/94  Pulse: 103  Temp: 98.1 F (36.7 C)  Resp: 18   Rigby MD paged. 12 Lead EKG ordered. Will continue to monitor patient. Velora Mediate

## 2013-03-26 NOTE — Progress Notes (Signed)
FMTS Attending  Note: Carla Sulton,MD I  have seen and examined this patient, reviewed their chart. I have discussed this patient with the resident. I agree with the resident's findings, assessment and care plan.  Influenza positive,continue droplet isolation,start on Tamiflu treatment dose.

## 2013-03-26 NOTE — Progress Notes (Signed)
Noted to be flu positive.  Start Tamiflu.  Problem list updated.  Gerda Diss, DO [facility] Resident - PGY-3 03/26/2013 1:02 PM

## 2013-03-26 NOTE — Progress Notes (Addendum)
Call placed to Dr. Paulla Fore informed patient temp is 101.5, no new orders at this time.

## 2013-03-26 NOTE — Progress Notes (Addendum)
Call place to Dr. Paulla Fore, informed patient has runs of wide QRS Tachycardia, patient in no acute distress.. No new order received will continue to  Monitor.

## 2013-03-26 NOTE — Progress Notes (Signed)
FMTS Attending Admission Note:  Kehinde Eniola,MD  I have seen and examined this patient, reviewed their chart. I have discussed this patient with the resident. I agree with the resident's findings, assessment and care plan.  Patient non-communicative,I could not obtain hx from her today. Chart reviewed.    Filed Vitals:     03/26/13 0208  03/26/13 0517  03/26/13 0815  03/26/13 1408    BP:  135/73  133/85      Pulse:  99  96      Temp:  98.7 F (37.1 C)  98.9 F (37.2 C)      TempSrc:  Axillary  Oral      Resp:  20  20      Weight:   222 lb 10.6 oz (100.999 kg)      SpO2:  100%  100%  98%  98%    Exam:  Gen: Calm in bed in very mild respiratory distress.Sleeping in bed.  Neuro: Not participating in exam.  Resp: Air entry equal B/L,no wheezing,again exam limited.  CV: S1 S2 normal,no murmurs.  ABd: benign.  Ext: Right LL rapped in ace bandage with B/L SCD.  A/P: 79 Y/O F with  1. Fever,Hypotension and tachy cardia/Met SIRS criteria for sepsis.  Obtain urine and blood culture.  Patient in a residential home,with increase risk for influenza,POCT influenza pending.  Place on droplet precaution.  Patient was started on Levaquin due to penicillin allergy.  Upon reviewing her record she had been on Aztreonam and Vanc in the past without allergy documented.  Plan to escalate A/B if no improvement on Levaquin.  2. UTI: Urine cx pending,continue Levaquin.  3. Hyperkalemia: S/P Kayexalate,plan to follow up BMet today.  4.ProBNP elevation/HTN: No documentation of CHF in her PMX.  Does not seem to be fluid overload at this time.  Patient on Aggrenox and Lipitor currently.  Consider betablocker addition if HR and blood pressure is running high,but was hypotensive on admission.  Echo done Dec 2014 reviewed. EF adequate.  COntinue to monitor for improvement.

## 2013-03-26 NOTE — Progress Notes (Signed)
Family Medicine Teaching Service Daily Progress Note Intern Pager: [phone]  Patient name: [name] Medical record number: [id number] Date of birth: 04/18/1934 Age: 79 y.o. Gender: female  Primary Care Provider: Garret Reddish, MD Consultants: None Code Status: DNR  Pt Overview and Major Events to Date:  1/10: admitted to tele  Assessment and Plan: [name] is a 79 y.o. year old female presenting with sepsis from likely urinary source.  Given the source of infection and her concomitant tachycardia, tachypnea, and fever sheet sepsis criteria.   Sepsis, Urinary source  - Urine and blood culture pending - Considered broadening antibiotic coverage, however at this time I will continue IV Levaquin to attempt to keep her coverage as there is possible considering her C. difficile infection on previous hospital admission and that she is clinically improving on IV Levaquin. - Would escalate to vancomycin and aztreonam as she is penicillin allergic and this was effective on previous admission if her clinical course declines.  Hyperkalemia  - Improved, potassium 5.8 reduced to 5.0. - S/P Kayexalate times one dose - Repeat q 12 hour BMP, Kayexalate as needed - Monitor on telemetry  L hand pain  - Not appreciated today - Plain film of left hand within normal limits, uric acid also normal - Continue to follow  Dyspnea - improving objectively by auscultation and WOB - Likely chronic given her sister's history.  - Likely with chronic pulmonary vascular congestion with severe AS and mild MR. - I still consider her to be intravascularly volume depleted - Consider PE, especially considering her previous DVT and rapid development of superficial thrombosis, however she is on treatment xarelto currently with good compliance making it much less likely -  troponins negative x2  Sacral wound  - wound care nurse to eval and treat, appreciate recommnedations  Vascular dementia  - Continue  Aggrenox and Lipitor  - Continue dysphagia 1 diet and thin liquids from previous SLP evaluation   HTN  - Normotensive currently  - Hold Ace inhibitor and clonidine  - Plan to restart clonidine first as I feel she is volume depleted and continuing the Ace would run the risk of causing AKI   CKD2  - IV fluids per above  - Avoid nephrotoxins   R distal femur fracture  - Continue wrapping her sister's reported orthopedic recommendations   FEN/GI: NS at 100/hr for 12 hours  Prophylaxis: On Xarelto, Pepcid  Disposition: tele for close observation  Code Status: DNR   Disposition: Continue telemetry, likely DC to skilled nursing facility tomorrow provided her clinical course continues to improve  Subjective: Patient resting comfortably, at baseline she is nonverbal. Per nursing her IV is infiltrated and they tried several times to replace.  Objective: Temp:  [98.7 F (37.1 C)-100.6 F (38.1 C)] 98.9 F (37.2 C) (01/11 0517) Pulse Rate:  [96-124] 96 (01/11 0517) Resp:  [20-30] 20 (01/11 0517) BP: (117-157)/(71-98) 133/85 mmHg (01/11 0517) SpO2:  [93 %-100 %] 98 % (01/11 0815) Weight:  [222 lb 10.6 oz (100.999 kg)] 222 lb 10.6 oz (100.999 kg) (01/11 0517) Physical Exam: Gen: NAD, alert, nonverbal  HEENT: NCAT, PERRL, MMM  CV: RRR, distant heart sounds  Resp: Expiratory wheezes scattered are much improved from admission, No increased WOB Abd: SNTND, BS present, no guarding or organomegaly  Ext: Left lower extremity with 2-3+ edema, 2+ DP pulses bilaterally  Neuro: sleeping, nonverbal   Laboratory:  Recent Labs Lab 03/25/13 1213 03/26/13 0610  WBC 10.8* 6.0  HGB  10.6* 10.0*  HCT 34.7* 32.5*  PLT 206 182    Recent Labs Lab 03/25/13 1213 03/26/13 0610  NA 142 143  K 5.8* 5.0  CL 106 108  CO2 24 22  BUN 23 23  CREATININE 0.94 0.94  CALCIUM 8.8 8.6  PROT 6.5  --   BILITOT 0.2*  --   ALKPHOS 89  --   ALT 11  --   AST 18  --   GLUCOSE 125* 85    proBNP :  12,213  Lactic acid: 1.25  UA:   03/25/2013 12:30   Color, Urine  YELLOW   APPearance  CLOUDY (A)   Specific Gravity, Urine  1.020   pH  5.5   Glucose  NEGATIVE   Bilirubin Urine  NEGATIVE   Ketones, ur  NEGATIVE   Protein  30 (A)   Urobilinogen, UA  0.2   Nitrite  POSITIVE (A)   Leukocytes, UA  MODERATE (A)   Hgb urine dipstick  MODERATE (A)   WBC, UA  21-50   RBC / HPF  3-6   Squamous Epithelial / LPF  RARE   Bacteria, UA  MANY (A)     Imaging/Diagnostic Tests: CXR 03/25/2012  IMPRESSION:  Cardiomegaly with vascular congestion. Stable exam.  DG L hand IMPRESSION:  No acute finding.  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    Carla Euler, MD 03/26/2013, 9:04 AM PGY-2, [place] Intern pager: [phone], text pages welcome

## 2013-03-26 NOTE — Progress Notes (Signed)
Attempted to call for report. Shanon Brow RN not available at this time. Per RN, will call back later. Velora Mediate

## 2013-03-26 NOTE — Progress Notes (Signed)
Patient admitted with sepsis, Hospice diagnosis of late effect CVA.  DNR on chart.  Found patient awake in bed and receiving blood draw.  No family present.  Reviewed chart and patient is being treated with IV Levaquin pending the results of blood and urine cultures.  Patient's cardiovascular and pulmonary status have improved since admission.  Continue supportive care and telemetry monitoring and anticipate discharge back to [facility] tomorrow.  Encourage a call to Hospice at [phone] with any needs.  April Vinetta Bergamo, RN, BSN Hospice

## 2013-03-27 ENCOUNTER — Encounter (HOSPITAL_COMMUNITY): Payer: Self-pay

## 2013-03-27 DIAGNOSIS — I10 Essential (primary) hypertension: Secondary | ICD-10-CM

## 2013-03-27 DIAGNOSIS — IMO0002 Reserved for concepts with insufficient information to code with codable children: Secondary | ICD-10-CM

## 2013-03-27 DIAGNOSIS — D649 Anemia, unspecified: Secondary | ICD-10-CM

## 2013-03-27 DIAGNOSIS — N182 Chronic kidney disease, stage 2 (mild): Secondary | ICD-10-CM

## 2013-03-27 DIAGNOSIS — I359 Nonrheumatic aortic valve disorder, unspecified: Secondary | ICD-10-CM

## 2013-03-27 DIAGNOSIS — E785 Hyperlipidemia, unspecified: Secondary | ICD-10-CM

## 2013-03-27 LAB — URINE CULTURE

## 2013-03-27 MED ORDER — LEVOFLOXACIN 500 MG PO TABS
500.0000 mg | ORAL_TABLET | Freq: Every day | ORAL | Status: DC
  Filled 2013-03-27: qty 1

## 2013-03-27 MED ORDER — IPRATROPIUM-ALBUTEROL 0.5-2.5 (3) MG/3ML IN SOLN
3.0000 mL | Freq: Two times a day (BID) | RESPIRATORY_TRACT | Status: DC
  Administered 2013-03-27 – 2013-03-28 (×3): 3 mL via RESPIRATORY_TRACT
  Filled 2013-03-27 (×3): qty 3

## 2013-03-27 MED ORDER — OSELTAMIVIR PHOSPHATE 75 MG PO CAPS: 75.0000 mg | ORAL_CAPSULE | Freq: Two times a day (BID) | ORAL | Status: DC

## 2013-03-27 MED ORDER — LEVOFLOXACIN 750 MG PO TABS
750.0000 mg | ORAL_TABLET | ORAL | Status: DC
  Administered 2013-03-28: 750 mg via ORAL
  Filled 2013-03-27 (×2): qty 1

## 2013-03-27 MED ORDER — LEVOFLOXACIN 500 MG PO TABS: 500.0000 mg | ORAL_TABLET | Freq: Every day | ORAL | Status: DC

## 2013-03-27 MED ORDER — LEVOFLOXACIN 750 MG PO TABS: 750.0000 mg | ORAL_TABLET | ORAL | Status: DC

## 2013-03-27 NOTE — Progress Notes (Signed)
PCP Social Visit Note [place]. Melanee Spry, MD, PGY-3  I have seen patient and discussed current hospitalization. I agree with the excellent care provided by the primary team of [place] and want to thank them for their continued efforts in caring for my patient.   Garret Reddish, MD, PGY-3 03/27/2013 11:56 AM

## 2013-03-27 NOTE — Progress Notes (Signed)
Family Medicine Teaching Service Daily Progress Note Intern Pager: [phone]  Patient name: [name] Medical record number: [id number] Date of birth: 08-16-1934 Age: 79 y.o. Gender: female  Primary Care Provider: Garret Reddish, MD Consultants: None Code Status: DNR  Pt Overview and Major Events to Date:  1/10: admitted to tele 1/12:   Assessment and Plan: DAYAN KREIS is a 79 y.o. year old female presenting with sepsis from likely urinary source.  Given the source of infection and her concomitant tachycardia, tachypnea, and fever she meets sepsis criteria.   Sepsis, Urinary source  - Urine and blood culture pending - Considered broadening antibiotic coverage, however at this time I will continue IV Levaquin to attempt to keep her coverage as there is possible considering her C. difficile infection on previous hospital admission and that she is clinically improving on IV Levaquin. - Would escalate to vancomycin and aztreonam as she is penicillin allergic and this was effective on previous admission if her clinical course declines.  Influenza: Flu A +, started tamiflu, droplet precautions  Hyperkalemia Resolved 1/10 s/p kayexalate - Monitor on telemetry  L hand pain  - Not appreciated today - Plain film of left hand within normal limits, uric acid also normal - Continue to follow  Dyspnea - improving objectively by auscultation and WOB - Likely chronic given her sister's history.  - Likely with chronic pulmonary vascular congestion with severe AS and mild MR. - I still consider her to be intravascularly volume depleted - Consider PE, especially considering her previous DVT and rapid development of superficial thrombosis, however she is on treatment xarelto currently with good compliance making it much less likely -  troponins negative x2  Sacral wound  - wound care nurse to eval and treat, appreciate recommnedations  Vascular dementia  - Continue Aggrenox and Lipitor  -  Continue dysphagia 1 diet and thin liquids from previous SLP evaluation   HTN  - Normotensive currently  - Hold Ace inhibitor and clonidine  - Plan to restart clonidine first as I feel she is volume depleted and continuing the Ace would run the risk of causing AKI   CKD2  - IV fluids per above  - Avoid nephrotoxins   R distal femur fracture  - Continue wrapping her sister's reported orthopedic recommendations  - Pain control  FEN/GI: NS at 100/hr for 12 hours  Prophylaxis: On Xarelto, Pepcid  Disposition: tele for close observation  Code Status: DNR   Disposition: Continue telemetry, likely DC to skilled nursing facility tomorrow provided her clinical course continues to improve  Subjective: Patient resting comfortably, at baseline she is nonverbal. Per nursing her IV is infiltrated and they tried several times to replace.  Objective: Temp:  [98.1 F (36.7 C)-101.5 F (38.6 C)] 99.6 F (37.6 C) (01/12 0947) Pulse Rate:  [87-106] 87 (01/12 0947) Resp:  [18-20] 20 (01/12 0947) BP: (114-146)/(56-94) 146/56 mmHg (01/12 0947) SpO2:  [95 %-100 %] 99 % (01/12 0947) Weight:  [225 lb 8.5 oz (102.3 kg)] 225 lb 8.5 oz (102.3 kg) (01/11 2041) Physical Exam: Gen: NAD, alert, nonverbal  HEENT: NCAT, PERRL, MMM  CV: RRR, distant heart sounds  Resp: Expiratory wheezes scattered are much improved from admission, No increased WOB Abd: SNTND, BS present, no guarding or organomegaly  Ext: Left lower extremity with 2-3+ edema, 2+ DP pulses bilaterally  Neuro: sleeping, nonverbal   Laboratory:  Recent Labs Lab 03/25/13 1213 03/26/13 0610  WBC 10.8* 6.0  HGB 10.6* 10.0*  HCT  34.7* 32.5*  PLT 206 182    Recent Labs Lab 03/25/13 1213 03/26/13 0610  NA 142 143  K 5.8* 5.0  CL 106 108  CO2 24 22  BUN 23 23  CREATININE 0.94 0.94  CALCIUM 8.8 8.6  PROT 6.5  --   BILITOT 0.2*  --   ALKPHOS 89  --   ALT 11  --   AST 18  --   GLUCOSE 125* 85    proBNP : 12,213  Lactic  acid: 1.25  UA:   03/25/2013 12:30   Color, Urine  YELLOW   APPearance  CLOUDY (A)   Specific Gravity, Urine  1.020   pH  5.5   Glucose  NEGATIVE   Bilirubin Urine  NEGATIVE   Ketones, ur  NEGATIVE   Protein  30 (A)   Urobilinogen, UA  0.2   Nitrite  POSITIVE (A)   Leukocytes, UA  MODERATE (A)   Hgb urine dipstick  MODERATE (A)   WBC, UA  21-50   RBC / HPF  3-6   Squamous Epithelial / LPF  RARE   Bacteria, UA  MANY (A)     Imaging/Diagnostic Tests: CXR 03/25/2012  IMPRESSION:  Cardiomegaly with vascular congestion. Stable exam.  DG L hand IMPRESSION:  No acute finding.  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  Multifocal osteoarthritis appearing worst at the 1st [facility] joint.    Vance Gather, MD 03/27/2013, 9:56 AM PGY-1, [place] Intern pager: [phone], text pages welcome

## 2013-03-27 NOTE — Progress Notes (Signed)
FMTS Attending Note  I personally saw and evaluated the patient. The plan of care was discussed with the resident team. I agree with the assessment and plan as documented by the resident.   1. Sepsis - suspect urinary source, agree with Levaquin, await urine and blood cultures 2. Influenza - likely lead to acute resp. Insufficiency, breathing status has improved, continue Tamiflu 3. Dementia/HTN/CKD/distal femur fracture stable  Dossie Arbour MD

## 2013-03-27 NOTE — Evaluation (Signed)
Clinical/Bedside Swallow Evaluation Patient Details  Name: [name] MRN: [id number] Date of Birth: [birth year]  Today's Date: 03/27/2013 Time: 4268-3419 SLP Time Calculation (min): 22 min  Past Medical History:  Past Medical History  Diagnosis Date  . Aphasia   . Muscle weakness   . Malaise   . Fatigue   . Breast neoplasm   . Hypertension   . Glaucoma   . UTI (lower urinary tract infection)   . Vitamin D deficiency   . Hiatal hernia   . Constipation   . Diarrhea   . Asthma   . Obesity   . Hyperlipidemia   . Alzheimer's dementia   . Depression   . Allergic rhinitis   . Dementia   . DVT (deep venous thrombosis) 2014    "put her on Xeralto" (01/20/2013)  . Pneumonia 2014  . Shortness of breath     "lately; just laying there" (01/20/2013)  . Type II diabetes mellitus   . Anemia   . History of blood transfusion     "got a unit yesterday" (01/20/2013)  . Stroke 2006; 2007    residual:  "mute, dementia, generalized weakness" (01/20/2013)  . Recurrent UTI     "at least 3 since 2008 when she went into nursing home" (01/20/2013)  . Breast cancer     "right" (01/20/2013)  . Depression    Past Surgical History:  Past Surgical History  Procedure Laterality Date  . Joint replacement      left knee in '90s  . Breast lumpectomy Right 1990's; 2000's    "she's had 2" (01/20/2013)  . Replacement total knee bilateral Bilateral 1990's  . Tee without cardioversion N/A 02/17/2013    Procedure: TRANSESOPHAGEAL ECHOCARDIOGRAM (TEE);  Surgeon: Dorothy Spark, MD;  Location: [facility] ENDOSCOPY;  Service: Cardiovascular;  Laterality: N/A;   HPI:  [name] is a 79 y.o. year old female presenting with sepsis from likely urinary source.  Given the source of infection and her concomitant tachycardia, tachypnea, and fever she meets sepsis criteria. Pt seen by SLP services during previous admission, maintained on Dys 1 textures and thin liquids.   Assessment / Plan / Recommendation Clinical  Impression  Pt presents wtith signs of dysphagia, including impaired awareness and manipulation of boluses, resulting in mildly prolonged oral transit of pureed foods. Pt with rotary chewing motion in the absence of PO trials. Although no overt s/s of aspiration were observed, pt did present with what appeared to be intermittently delayed swallow initiation and decreased hyolaryngeal elevation and excursion. Given the risk factors above, recommend to resume Dys 1 textures and thin liquids via small cup sips with full supervision for utilization of safe swallowing strategies.    Aspiration Risk  Moderate    Diet Recommendation Dysphagia 1 (Puree);Thin liquid   Liquid Administration via: Cup;No straw Medication Administration: Crushed with puree Supervision: Staff to assist with self feeding;Full supervision/cueing for compensatory strategies Compensations: Slow rate;Small sips/bites Postural Changes and/or Swallow Maneuvers: Seated upright 90 degrees    Other  Recommendations Oral Care Recommendations: Oral care BID   Follow Up Recommendations  Skilled Nursing facility    Frequency and Duration min 2x/week  2 weeks   Pertinent Vitals/Pain N/A    SLP Swallow Goals   Please refer to care plan.   Swallow Study Prior Functional Status       General Date of Onset: 03/25/13 HPI: [name] is a 79 y.o. year old female presenting with sepsis from likely urinary  source.  Given the source of infection and her concomitant tachycardia, tachypnea, and fever she meets sepsis criteria. Pt seen by SLP services during previous admission, maintained on Dys 1 textures and thin liquids. Type of Study: Bedside swallow evaluation Previous Swallow Assessment: bedside swallow eval 01/2013 with recommendations for Dysd 1 textures and thin liquids Diet Prior to this Study: NPO Temperature Spikes Noted: Yes Respiratory Status: Room air History of Recent Intubation: No Behavior/Cognition: Alert;Requires  cueing Oral Cavity - Dentition: Adequate natural dentition Self-Feeding Abilities: Total assist Patient Positioning: Upright in bed Baseline Vocal Quality: Aphonic Volitional Cough: Cognitively unable to elicit Volitional Swallow: Unable to elicit    Oral/Motor/Sensory Function Overall Oral Motor/Sensory Function: Other (comment) (limited assessment-decreased ability to follow commands) Labial ROM: Within Functional Limits Labial Symmetry: Within Functional Limits Lingual Symmetry: Within Functional Limits Mandible: Within Functional Limits   Ice Chips Ice chips: Not tested   Thin Liquid Thin Liquid: Impaired Presentation: Cup;Straw Oral Phase Impairments: Poor awareness of bolus Pharyngeal  Phase Impairments: Suspected delayed Swallow;Decreased hyoid-laryngeal movement    Nectar Thick Nectar Thick Liquid: Not tested   Honey Thick Honey Thick Liquid: Not tested   Puree Puree: Impaired Presentation: Spoon Oral Phase Impairments: Reduced lingual movement/coordination;Impaired anterior to posterior transit;Poor awareness of bolus Oral Phase Functional Implications: Prolonged oral transit Pharyngeal Phase Impairments: Suspected delayed Swallow;Decreased hyoid-laryngeal movement;Cough - Delayed   Solid   GO    Solid: Not tested         Germain Osgood, M.A. CCC-SLP [phone]  Germain Osgood 03/27/2013,3:04 PM

## 2013-03-27 NOTE — Discharge Summary (Addendum)
[facility] Discharge Summary  Patient name: [name] Medical record number: [id number] Date of birth: Aug 15, 1934 Age: 79 y.o. Gender: female Date of Admission: 03/25/2013  Date of Discharge: 03/28/2013 Admitting Physician: Carla Mews, MD  Primary Care Provider: Garret Reddish, MD Consultants: None  Indication for Hospitalization: Sepsis from urinary source and influenza  Discharge Diagnoses/Problem List:  DNR Patient Active Problem List   Diagnosis Date Noted  . Influenza with pneumonia 03/26/2013  . UTI (urinary tract infection) 03/25/2013  . Sepsis 03/25/2013  . C. difficile colitis 02/23/2013  . Coagulase negative Staphylococcus bacteremia 02/12/2013  . Closed fracture of right femur 01/24/2013  . Anemia 05/25/2012  . DVT (deep venous thrombosis) 05/12/2012  . Mitral stenosis 02/15/2012  . Sacral pressure sore 02/15/2012  . Hypertension 12/27/2011  . Aortic stenosis 12/11/2011  . CKD (chronic kidney disease) stage 2, GFR 60-89 ml/min 12/11/2011  . Breast neoplasm   . Glaucoma   . Vitamin D deficiency   . Hiatal hernia   . Hyperlipidemia   . Vascular dementia   . Allergic rhinitis    Disposition: Discharged back to [facility]  Discharge Condition: Stable  Discharge Exam:  Gen: Elderly female sitting up in bed in NAD HEENT: NCAT, PERRL, MMM  CV: RRR, distant heart sounds  Resp: Scattered expiratory wheezes are much improved from admission, No increased WOB or O2 requirement Abd: SNTND, BS present, no guarding or organomegaly  Ext: Left lower extremity with 2-3+ edema, R leg wrapped, 2+ DP pulses bilaterally  Neuro: Alert, not oriented, responds with 1-2 word answers  Brief Hospital Course:  [name] is a 79 y.o. year old female presenting 1/11 from [facility] with sepsis from likely urinary source.   Given the source of infection on U/A and her concomitant tachycardia, tachypnea, and fever she met sepsis criteria,  though without leukocytosis (WBC 6.0). Blood and urine cultures were collected and levaquin IV was started. Rapid flu test returned positive (Influenza A) and tamiflu was started on 1/11. She reported chronic dyspnea, though she was thought to be volume depleted. Troponins were negative and her work of breathing improved with treatment of the flu. Urine culture grew >100k K. pneumoniae with intermediate sensitivity to levofloxacin. It was sensitive to macrobid and rocephin, and so antibiotic coverage was changed to rocephin IM x3 days. Though [name] has a documented penicillin allergy, she has tolerated this antibiotic in 2013.  She continued to improve, with last fever 100.40F on 1/11, and resolution of tachycardia and tachypnea. She will complete 3 more days of antibiotics and a 5-day course of tamiflu.   Hyperkalemia was present on admission and resolved 1/10 s/p kayexalate without telemetric events. Left hand pain was reported and a plain film of left hand was within normal limits and uric acid was normal. A stage IV sacral wound was present on admission and a wound care nurse recommended aquacel and foam dressing and air mattress to reduce pressure. Aggrenox and lipitor were continued for a history of vascular dementia.  Ace inhibitor and clonidine were held because the patient was normotensive and to avoid AKI in the setting of stage 2 CKD.  A right distal femur fracture sustained prior to admission was treated with continued wrapping and pain control.   Issues for Follow Up:  - Family to meet with hospice to discuss goals of care again on Wednesday, 03/29/2013 at SNF - Follow up resolution of SIRS criteria  Significant Procedures: None  Significant  Labs and Imaging:   Recent Labs Lab 03/25/13 1213 03/26/13 0610 03/28/13 0615  WBC 10.8* 6.0 4.3  HGB 10.6* 10.0* 9.2*  HCT 34.7* 32.5* 29.3*  PLT 206 182 176    Recent Labs Lab 03/25/13 1213 03/26/13 0610  NA 142 143  K 5.8*  5.0  CL 106 108  CO2 24 22  GLUCOSE 125* 85  BUN 23 23  CREATININE 0.94 0.94  CALCIUM 8.8 8.6  ALKPHOS 89  --   AST 18  --   ALT 11  --   ALBUMIN 2.7*  --    proBNP : 12,213  Lactic acid: 1.25  UA:   03/25/2013 12:30   Color, Urine  YELLOW   APPearance  CLOUDY (A)   Specific Gravity, Urine  1.020   pH  5.5   Glucose  NEGATIVE   Bilirubin Urine  NEGATIVE   Ketones, ur  NEGATIVE   Protein  30 (A)   Urobilinogen, UA  0.2   Nitrite  POSITIVE (A)   Leukocytes, UA  MODERATE (A)   Hgb urine dipstick  MODERATE (A)   WBC, UA  21-50   RBC / HPF  3-6   Squamous Epithelial / LPF  RARE   Bacteria, UA  MANY (A)    CXR 03/25/2012  IMPRESSION:  Cardiomegaly with vascular congestion. Stable exam.   DG L hand  IMPRESSION:  No acute finding.  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   Results/Tests Pending at Time of Discharge: None  Discharge Medications:    Medication List         acetaminophen 325 MG tablet  Commonly known as:  TYLENOL  Take 650 mg by mouth every 4 (four) hours as needed for fever.     atorvastatin 20 MG tablet  Commonly known as:  LIPITOR  Take 20 mg by mouth daily.     calcium citrate 950 MG tablet  Commonly known as:  CALCITRATE - dosed in mg elemental calcium  Take 400 mg of elemental calcium by mouth daily.     cefTRIAXone 1 G injection  Commonly known as:  ROCEPHIN  Inject 1 g into the muscle daily.     cloNIDine 0.1 mg/24hr patch  Commonly known as:  CATAPRES - Dosed in mg/24 hr  Place 0.1 mg onto the skin once a week.     dextromethorphan-guaiFENesin 30-600 MG per 12 hr tablet  Commonly known as:  MUCINEX DM  Take 1 tablet by mouth 2 (two) times daily.     dipyridamole-aspirin 200-25 MG per 12 hr capsule  Commonly known as:  AGGRENOX  Take 1 capsule by mouth 2 (two) times daily.     fluticasone 50 MCG/ACT nasal spray  Commonly known as:   FLONASE  Place 1 spray into both nostrils daily.     lisinopril 2.5 MG tablet  Commonly known as:  PRINIVIL,ZESTRIL  Take 2.5 mg by mouth daily.     oseltamivir 75 MG capsule  Commonly known as:  TAMIFLU  Take 1 capsule (75 mg total) by mouth 2 (two) times daily.     polyethylene glycol powder powder  Commonly known as:  GLYCOLAX/MIRALAX  Take 17 g by mouth daily as needed for mild constipation. FOR CONSTIPATION     promethazine 25 MG tablet  Commonly known as:  PHENERGAN  Take 25 mg by mouth every 6 (six) hours as needed for nausea.  ranitidine 150 MG tablet  Commonly known as:  ZANTAC  Take 150 mg by mouth every evening.     Travoprost (BAK Free) 0.004 % Soln ophthalmic solution  Commonly known as:  TRAVATAN  Place 1 drop into both eyes at bedtime.     Vitamin D (Ergocalciferol) 50000 UNITS Caps capsule  Commonly known as:  DRISDOL  Take 1 capsule (50,000 Units total) by mouth every 30 (thirty) days. Give q 15th day of each month     XARELTO 20 MG Tabs tablet  Generic drug:  Rivaroxaban  Take 20 mg by mouth daily.        Discharge Instructions: Please refer to Patient Instructions section of EMR for full details.  Patient was counseled important signs and symptoms that should prompt return to medical care, changes in medications, dietary instructions, activity restrictions, and follow up appointments.   Follow-Up Appointments: Follow-up Information   Follow up with Carla Reddish, MD.   Specialty:  Family Medicine   Contact information:   [address] [place] [address] [phone]       Vance Gather, MD 03/28/2013, 12:36 PM PGY-1, [place]

## 2013-03-27 NOTE — Progress Notes (Signed)
ANTIBIOTIC CONSULT NOTE - INITIAL  Pharmacy Consult for Levaquin Indication: Urinary sepsis  Allergies  Allergen Reactions  . Penicillins Other (See Comments)    Per MAR  . Tetanus Toxoids Other (See Comments)    Per MAR  . Vicodin [Hydrocodone-Acetaminophen] Other (See Comments)    Per [facility]    Patient Measurements: Weight: 225 lb 8.5 oz (102.3 kg) Adjusted Body Weight:   Vital Signs: Temp: 98 F (36.7 C) (01/12 1324) Temp src: Oral (01/12 1324) BP: 138/80 mmHg (01/12 1324) Pulse Rate: 88 (01/12 1324) Intake/Output from previous day: 01/11 0701 - 01/12 0700 In: 1343.3 [I.V.:1193.3; IV Piggyback:150] Out: 1800 [Urine:1800] Intake/Output from this shift:    Labs:  Recent Labs  03/25/13 1213 03/26/13 0610  WBC 10.8* 6.0  HGB 10.6* 10.0*  PLT 206 182  CREATININE 0.94 0.94   The CrCl is unknown because both a height and weight (above a minimum accepted value) are required for this calculation. No results found for this basename: VANCOTROUGH, Corlis Leak, VANCORANDOM, [place], [place], [place], [place], [place], TOBRARND, AMIKACINPEAK, AMIKACINTROU, AMIKACIN,  in the last 72 hours   Microbiology: Recent Results (from the past 720 hour(s))  URINE CULTURE     Status: None   Collection Time    03/25/13 12:30 PM      Result Value Range Status   Specimen Description URINE, CATHETERIZED   Final   Special Requests NONE   Final   Culture  Setup Time     Final   Value: 03/25/2013 17:45     Performed at [facility]     Final   Value: >=100,000 COLONIES/ML     Performed at [facility]   Culture     Final   Value: KLEBSIELLA PNEUMONIAE     Performed at [facility]   Report Status 03/27/2013 FINAL   Final   Organism ID, Bacteria KLEBSIELLA PNEUMONIAE   Final  CULTURE, BLOOD (ROUTINE X 2)     Status: None   Collection Time    03/26/13 12:50 AM      Result Value Range Status   Specimen Description BLOOD RIGHT FOREARM    Final   Special Requests BOTTLES DRAWN AEROBIC AND ANAEROBIC 5CC EACH   Final   Culture  Setup Time     Final   Value: 03/26/2013 17:43     Performed at [facility]   Culture     Final   Value:        BLOOD CULTURE RECEIVED NO GROWTH TO DATE CULTURE WILL BE HELD FOR 5 DAYS BEFORE ISSUING A FINAL NEGATIVE REPORT     Performed at [facility]   Report Status PENDING   Incomplete  CULTURE, BLOOD (ROUTINE X 2)     Status: None   Collection Time    03/26/13 12:58 AM      Result Value Range Status   Specimen Description BLOOD RIGHT FOREARM   Final   Special Requests BOTTLES DRAWN AEROBIC AND ANAEROBIC 4CC EACH   Final   Culture  Setup Time     Final   Value: 03/26/2013 17:43     Performed at [facility]   Culture     Final   Value:        BLOOD CULTURE RECEIVED NO GROWTH TO DATE CULTURE WILL BE HELD FOR 5 DAYS BEFORE ISSUING A FINAL NEGATIVE REPORT     Performed at [facility]   Report Status  PENDING   Incomplete  MRSA PCR SCREENING     Status: None   Collection Time    03/26/13  5:00 AM      Result Value Range Status   MRSA by PCR NEGATIVE  NEGATIVE Final   Comment:            The GeneXpert MRSA Assay (FDA     approved for NASAL specimens     only), is one component of a     comprehensive MRSA colonization     surveillance program. It is not     intended to diagnose MRSA     infection nor to guide or     monitor treatment for     MRSA infections.    Medical History: Past Medical History  Diagnosis Date  . Aphasia   . Muscle weakness   . Malaise   . Fatigue   . Breast neoplasm   . Hypertension   . Glaucoma   . UTI (lower urinary tract infection)   . Vitamin D deficiency   . Hiatal hernia   . Constipation   . Diarrhea   . Asthma   . Obesity   . Hyperlipidemia   . Alzheimer's dementia   . Depression   . Allergic rhinitis   . Dementia   . DVT (deep venous thrombosis) 2014    "put her on Xeralto" (01/20/2013)  . Pneumonia 2014   . Shortness of breath     "lately; just laying there" (01/20/2013)  . Type II diabetes mellitus   . Anemia   . History of blood transfusion     "got a unit yesterday" (01/20/2013)  . Stroke 2006; 2007    residual:  "mute, dementia, generalized weakness" (01/20/2013)  . Recurrent UTI     "at least 3 since 2008 when she went into nursing home" (01/20/2013)  . Breast cancer     "right" (01/20/2013)  . Depression     Assessment: Admit Complaint: Sepsis of suspected urinary source with tachycardia, tachypnea, and fever.  Anticoagulation: h/o DVT and superficial thrombosis. Xarelto 20mg /d  Infectious Disease: h/o Cdiff. Urinary sepsis on IV Levaquin. +Flu. Tmax 101.5. WBC 6. BC x 2 pending.Meds: Tamiflu, Levaquin  Cardiovascular: having some arrhythmias. 146/56, HR 87 on Lipitor, Aggrenox  Endocrinology: Glucose 85  Gastrointestinal / Nutrition: pepcid  Neurology: Vascular dementia. Nonverbal  Nephrology: CKD2: Scr 0.94. CrCl 77  Pulmonary  Hematology / Oncology: Hgb 10.  PTA Medication Issues  Best Practices  Plan:  Levaquin 750mg  po q24h dose ok Xarelto 20mg /d dose ok    Theotis Gerdeman S. Alford Highland, PharmD, BCPS Clinical Staff Pharmacist Pager [phone]  Eilene Ghazi Stillinger 03/27/2013,1:26 PM

## 2013-03-27 NOTE — Progress Notes (Signed)
Inpatient Room [place] [facility] RN Visit- M. Wynetta Emery, RN  Related admit to [facility] diagnosis of late effect CVA;  Code status is a DNR; OOF DNR form, medication list and transfer summary on the shadow chart   Pt seen at bedside awake, alert, lying in bed, denied complaints of pain or discomfort; able to answer questions appropriately; audible expiratory wheezing noted RR=18-20 on room air; No family present.   Please call [facility] @ [phone]- ask for RN Liaison or after hours,ask for on-call RN with any hospice needs.   Thank you.  Danton Sewer, RN  [facility]  Hospice Liaison  [phone])

## 2013-03-27 NOTE — Discharge Instructions (Signed)
Information on my medicine - XARELTO (rivaroxaban)  This medication education was reviewed with me or my healthcare representative as part of my discharge preparation.  The pharmacist that spoke with me during my hospital stay was:  Wayland Salinas, [place]? Xarelto was prescribed to treat blood clots that may have been found in the veins of your legs (deep vein thrombosis) or in your lungs (pulmonary embolism) and to reduce the risk of them occurring again.  What do you need to know about Xarelto? The starting dose is one 15 mg tablet taken TWICE daily with food for the FIRST 21 DAYS then on (enter date)     the dose is changed to one 20 mg tablet taken ONCE A DAY with your evening meal.  DO NOT stop taking Xarelto without talking to the health care provider who prescribed the medication.  Refill your prescription for 20 mg tablets before you run out.  After discharge, you should have regular check-up appointments with your healthcare provider that is prescribing your Xarelto.  In the future your dose may need to be changed if your kidney function changes by a significant amount.  What do you do if you miss a dose? If you are taking Xarelto TWICE DAILY and you miss a dose, take it as soon as you remember. You may take two 15 mg tablets (total 30 mg) at the same time then resume your regularly scheduled 15 mg twice daily the next day.  If you are taking Xarelto ONCE DAILY and you miss a dose, take it as soon as you remember on the same day then continue your regularly scheduled once daily regimen the next day. Do not take two doses of Xarelto at the same time.   Important Safety Information Xarelto is a blood thinner medicine that can cause bleeding. You should call your healthcare provider right away if you experience any of the following:   Bleeding from an injury or your nose that does not stop.   Unusual colored urine (red or dark  brown) or unusual colored stools (red or [place]).   Unusual bruising for unknown reasons.   A serious fall or if you hit your head (even if there is no bleeding).  Some medicines may interact with Xarelto and might increase your risk of bleeding while on Xarelto. To help avoid this, consult your healthcare provider or pharmacist prior to using any new prescription or non-prescription medications, including herbals, vitamins, non-steroidal anti-inflammatory drugs (NSAIDs) and supplements.  This website has more information on Xarelto: [email or web address]-benson.[email or web address]

## 2013-03-27 NOTE — Progress Notes (Signed)
SW and Chaplain completed  Joint visit with patient. She was in bed, non-verbal, but no pain or distress noted.Some wheezing noted when patient breathed.  NO family was present. Hospice team have care plan meeting scheduled with patient's sister, [name] for Wednesday at 2pm at the facility to discuss goals of care. Chaplain provided prayer to patient and support to patient. Please call for any questions or concerns, [phone].  [address] [place], [place] [phone])

## 2013-03-27 NOTE — Progress Notes (Signed)
INITIAL NUTRITION ASSESSMENT  DOCUMENTATION CODES Per approved criteria  -Obesity Unspecified   INTERVENTION: 1.  Modify diet; resume PO diet once medically appropriate per MD discretion. Pt has consumed Ensure Pudding po TID in previous admission, each supplement provides 170 kcal and 4 grams of protein.   NUTRITION DIAGNOSIS: Inadequate oral intake related to inability to eat, dementia AEB NPO for safety.   Monitor:  1.  Food/Beverage; diet advancement with pt meeting >/=90% estimated needs with tolerance. 2.  Wt/wt change; monitor trends  Reason for Assessment: MST  79 y.o. female  Admitting Dx: knee pain  ASSESSMENT: Pt admitted with sepsis.   Pt has been followed by RD at recent hospitalizations.  Her intake has been variable, but overall adequate with the addition of supplements.   SLP in for assessment at time of visit.  She is non-verbal and no family at bedside today.  Pt has required short term TFs in the past.  RD to follow for ongoing assessment and interventions.   Current wt of 225 lbs is consistent with most recent wt trends, however RD note pressure ulcer has progressed from Stage 2 to Stage 4 over the past 2 months.   Height: Ht Readings from Last 1 Encounters:  03/25/13 5\' 9"  (1.753 m)    Weight: Wt Readings from Last 1 Encounters:  03/26/13 225 lb 8.5 oz (102.3 kg)    Ideal Body Weight: 65.9 kg  % Ideal Body Weight: 155%  Wt Readings from Last 10 Encounters:  03/26/13 225 lb 8.5 oz (102.3 kg)  03/25/13 222 lb 10.6 oz (100.999 kg)  02/22/13 251 lb 5.2 oz (114 kg)  02/22/13 251 lb 5.2 oz (114 kg)  01/21/13 213 lb (96.616 kg)  01/14/13 213 lb 8 oz (96.843 kg)  12/15/12 221 lb 9.6 oz (100.517 kg)  12/18/12 219 lb (99.338 kg)  10/30/12 217 lb (98.431 kg)  09/01/12 227 lb (102.967 kg)    Usual Body Weight: 220 lbs  % Usual Body Weight: 102%  Estimated body mass index is 33.29 kg/(m^2) as calculated from the following:   Height as of an  earlier encounter on 03/25/13: 5\' 9"  (1.753 m).   Weight as of this encounter: 225 lb 8.5 oz (102.3 kg).  Estimated Nutritional Needs: Kcal: 1680-1800 Protein: 77-90g Fluid: >1.8 L/day  Skin:  Stage 4 pressure ulcer Generalized edema  Diet Order: NPO  EDUCATION NEEDS: -No education needs identified at this time   Intake/Output Summary (Last 24 hours) at 03/27/13 1449 Last data filed at 03/27/13 0409  Gross per 24 hour  Intake 1193.33 ml  Output    900 ml  Net 293.33 ml    Last BM: 1/12  Labs:   Recent Labs Lab 03/25/13 1213 03/26/13 0610  NA 142 143  K 5.8* 5.0  CL 106 108  CO2 24 22  BUN 23 23  CREATININE 0.94 0.94  CALCIUM 8.8 8.6  GLUCOSE 125* 85    CBG (last 3)  No results found for this basename: GLUCAP,  in the last 72 hours  Scheduled Meds: . atorvastatin  20 mg Oral Daily  . dipyridamole-aspirin  1 capsule Oral BID  . famotidine  20 mg Oral QHS  . ipratropium-albuterol  3 mL Nebulization BID  . latanoprost  1 drop Both Eyes QHS  . [START ON 03/28/2013] levofloxacin  750 mg Oral Q24H  . oseltamivir  75 mg Oral BID  . Rivaroxaban  20 mg Oral Q supper  . sodium chloride  3 mL Intravenous Q12H    Continuous Infusions: . sodium chloride 100 mL/hr at 03/27/13 1306    Past Medical History  Diagnosis Date  . Aphasia   . Muscle weakness   . Malaise   . Fatigue   . Breast neoplasm   . Hypertension   . Glaucoma   . UTI (lower urinary tract infection)   . Vitamin D deficiency   . Hiatal hernia   . Constipation   . Diarrhea   . Asthma   . Obesity   . Hyperlipidemia   . Alzheimer's dementia   . Depression   . Allergic rhinitis   . Dementia   . DVT (deep venous thrombosis) 2014    "put her on Xeralto" (01/20/2013)  . Pneumonia 2014  . Shortness of breath     "lately; just laying there" (01/20/2013)  . Type II diabetes mellitus   . Anemia   . History of blood transfusion     "got a unit yesterday" (01/20/2013)  . Stroke 2006; 2007     residual:  "mute, dementia, generalized weakness" (01/20/2013)  . Recurrent UTI     "at least 3 since 2008 when she went into nursing home" (01/20/2013)  . Breast cancer     "right" (01/20/2013)  . Depression     Past Surgical History  Procedure Laterality Date  . Joint replacement      left knee in '90s  . Breast lumpectomy Right 1990's; 2000's    "she's had 2" (01/20/2013)  . Replacement total knee bilateral Bilateral 1990's  . Tee without cardioversion N/A 02/17/2013    Procedure: TRANSESOPHAGEAL ECHOCARDIOGRAM (TEE);  Surgeon: Dorothy Spark, MD;  Location: [facility];  Service: Cardiovascular;  Laterality: N/A;    Brynda Greathouse, MS RD LDN Clinical Inpatient Dietitian Pager: [phone] Weekend/After hours pager: [phone]

## 2013-03-28 DIAGNOSIS — J1100 Influenza due to unidentified influenza virus with unspecified type of pneumonia: Secondary | ICD-10-CM

## 2013-03-28 DIAGNOSIS — F0150 Vascular dementia without behavioral disturbance: Secondary | ICD-10-CM

## 2013-03-28 DIAGNOSIS — I82409 Acute embolism and thrombosis of unspecified deep veins of unspecified lower extremity: Secondary | ICD-10-CM

## 2013-03-28 LAB — CBC
HEMATOCRIT: 29.3 % — AB (ref 36.0–46.0)
HEMOGLOBIN: 9.2 g/dL — AB (ref 12.0–15.0)
MCH: 27.4 pg (ref 26.0–34.0)
MCHC: 31.4 g/dL (ref 30.0–36.0)
MCV: 87.2 fL (ref 78.0–100.0)
Platelets: 176 10*3/uL (ref 150–400)
RBC: 3.36 MIL/uL — AB (ref 3.87–5.11)
RDW: 19.1 % — ABNORMAL HIGH (ref 11.5–15.5)
WBC: 4.3 10*3/uL (ref 4.0–10.5)

## 2013-03-28 MED ORDER — CEFTRIAXONE SODIUM 1 G IJ SOLR: 1.0000 g | INTRAMUSCULAR | Status: DC

## 2013-03-28 MED ORDER — DEXTROSE 5 % IV SOLN
1.0000 g | INTRAVENOUS | Status: DC
  Administered 2013-03-28: 1 g via INTRAVENOUS
  Filled 2013-03-28: qty 10

## 2013-03-28 NOTE — Progress Notes (Signed)
03/28/13 20:47 Patient discharged back to [facility] via transport. Lashandra Arauz Joselita,RN

## 2013-03-28 NOTE — Progress Notes (Signed)
Attempted to call report to [facility]).  Had to leave my name and number with the desk staff, to have Shinai to call me back.  Jillyn Ledger, MBA, BS, RN

## 2013-03-28 NOTE — Discharge Summary (Addendum)
FMTS Attending Note  I personally saw and evaluated the patient. The plan of care was discussed with the resident team. I agree with the assessment and plan as documented by the resident.   Patient is wheezing during the exam, last breathing treatment was yesterday other than Scheduled Duoneb this AM, had RN call RT for albuterol treatment, if patient shows clinical improvement she can be discharged to [facility] this evening  Influenza - patient to complete 5 day course of Tamiflu, scheduled duonebs and PRN albuterol at [facility] UTI sensitive to Rocephin - patient to receive IV dose of Rocephin today, to complete 3 days total of Rocephin at nursing home.  Disposition: if breathing status improved with breathing treatment I agree with discharge to NH, her hemoglobin did drop slightly during her stay which is likely dilutional- will need repeat CBC as outpatient  Dossie Arbour MD

## 2013-03-28 NOTE — Progress Notes (Signed)
Utilization review completed.  

## 2013-03-28 NOTE — Progress Notes (Signed)
Inpatient Room [place] [facility] RN Visit- M. Wynetta Emery, RN  Related admit to [facility] diagnosis of late effect CVA; Code status is a DNR; OOF DNR form, medication list and transfer summary on the shadow chart  Pt seen lying in bed, awake, alert denied complaints of pain or discomfort; able to answer questions appropriately; audible expiratory wheezing noted RR=18 on room air; No family present.  Dr Bonner Puna at bedside during visit, indicated plan to continue treating flu, discharge possibly today following primary team evaluation after rounds.  Please call [facility] @ [phone]- ask for RN Liaison or after hours,ask for on-call RN with any hospice needs.  Thank you. Danton Sewer, RN [facility] Hospice Liaison [phone])

## 2013-03-28 NOTE — Clinical Social Work Psychosocial (Signed)
Clinical Social Work Department BRIEF PSYCHOSOCIAL ASSESSMENT AND DISCHARGE NOTE 03/28/2013  Patient:  [name], [name]     Account Number:  [id number]     Admit date:  03/25/2013  Clinical Social Worker:  Frederico Hamman  Date/Time:  03/28/2013 05:54 AM  Referred by:  Physician  Date Referred:  03/27/2013 Referred for  SNF Placement   Other Referral:   Interview type:  Patient Other interview type:    PSYCHOSOCIAL DATA Living Status:  FACILITY Admitted from facility:  [facility] Level of care:  [place] Primary support name:  [name] Primary support relationship to patient:  [place] Degree of support available:   Patient also has a niece-Dawn Chana Bode and sister Jule Economy lists as contacts with the hospital. Level of support unknown.    CURRENT CONCERNS Current Concerns  Post-Acute Placement   Other Concerns:    SOCIAL WORK ASSESSMENT / PLAN On 03/28/13 CSW intern Wellington [place] talked with patient to advise her of d/c back to [facility] today. Patient was agreeable to returning to facility.   Assessment/plan status:  No Further Intervention Required Other assessment/ plan:   Information/referral to community resources:   None needed or requested at time of visit.    PATIENT'S/FAMILY'S RESPONSE TO PLAN OF CARE: Patient agreeable to returning to [facility] skilled nursing facility today. Discharge information transmitted to facility and patient transported to SNF via ambulance.

## 2013-03-28 NOTE — Progress Notes (Signed)
Pt prepared for d/c to [facility]). IV d/c'd. Skin intact except as most recently charted. Vitals are stable. Report called to Sana at receiving facility. Pt to be transported by ambulance service.  Jillyn Ledger, MBA, BS, RN

## 2013-03-28 NOTE — Progress Notes (Signed)
Speech Language Pathology Treatment: Dysphagia  Patient Details Name: NATASHA BURDA MRN: [id number] DOB: 07-08-1934 Today's Date: 03/28/2013 Time: 5621-3086 SLP Time Calculation (min): 15 min  Assessment / Plan / Recommendation Clinical Impression  Pt seen for f/u dysphagia treatment. Pt with improved alertness and interaction today, verbally responding to yes/no questions. Pt consumed trials of Dys 1 textures and thin liquids with clear vocal quality throughout trials and no overt coughing or throat clearing. Pt exhibited mild oral residuals that were reduced with a liquid wash. She continues to have audible wheeze upon exhalation, which does not appear to change during PO intake. Will continue to follow for diet tolerance.   HPI HPI: NAYDEEN SPEIRS is a 79 y.o. year old female presenting with sepsis from likely urinary source.  Given the source of infection and her concomitant tachycardia, tachypnea, and fever she meets sepsis criteria. Pt seen by SLP services during previous admission, maintained on Dys 1 textures and thin liquids.   Pertinent Vitals N/A  SLP Plan  Continue with current plan of care    Recommendations Diet recommendations: Dysphagia 1 (puree);Thin liquid Liquids provided via: Cup;No straw Medication Administration: Crushed with puree Supervision: Staff to assist with self feeding;Full supervision/cueing for compensatory strategies Compensations: Slow rate;Small sips/bites Postural Changes and/or Swallow Maneuvers: Seated upright 90 degrees              Oral Care Recommendations: Oral care BID Follow up Recommendations: Skilled Nursing facility Plan: Continue with current plan of care    GO       Germain Osgood, M.A. CCC-SLP [phone]  Germain Osgood 03/28/2013, 3:47 PM

## 2013-03-28 NOTE — Progress Notes (Signed)
Transport is here to pick up patient back to [facility],patient has audible rhonchi.Patient was given neb treatment at 18:59.MD on call notified.MD to come and see patient.Will continue to monitor. Vertis Scheib Joselita,RN

## 2013-03-29 ENCOUNTER — Non-Acute Institutional Stay: Payer: Medicare Other | Admitting: Family Medicine

## 2013-03-29 ENCOUNTER — Non-Acute Institutional Stay (INDEPENDENT_AMBULATORY_CARE_PROVIDER_SITE_OTHER): Payer: Medicare Other | Admitting: Family Medicine

## 2013-03-29 ENCOUNTER — Encounter: Payer: Self-pay | Admitting: Family Medicine

## 2013-03-29 DIAGNOSIS — N33 Bladder disorders in diseases classified elsewhere: Secondary | ICD-10-CM

## 2013-03-29 DIAGNOSIS — A414 Sepsis due to anaerobes: Secondary | ICD-10-CM

## 2013-03-29 DIAGNOSIS — A499 Bacterial infection, unspecified: Secondary | ICD-10-CM

## 2013-03-29 DIAGNOSIS — A4159 Other Gram-negative sepsis: Secondary | ICD-10-CM

## 2013-03-29 DIAGNOSIS — B9689 Other specified bacterial agents as the cause of diseases classified elsewhere: Secondary | ICD-10-CM

## 2013-03-29 DIAGNOSIS — S7291XA Unspecified fracture of right femur, initial encounter for closed fracture: Secondary | ICD-10-CM

## 2013-03-29 DIAGNOSIS — N3090 Cystitis, unspecified without hematuria: Principal | ICD-10-CM

## 2013-03-29 DIAGNOSIS — S7290XA Unspecified fracture of unspecified femur, initial encounter for closed fracture: Secondary | ICD-10-CM

## 2013-03-29 DIAGNOSIS — J1100 Influenza due to unidentified influenza virus with unspecified type of pneumonia: Secondary | ICD-10-CM

## 2013-03-29 MED ORDER — OSELTAMIVIR PHOSPHATE 75 MG PO CAPS: 75.0000 mg | ORAL_CAPSULE | Freq: Two times a day (BID) | ORAL | Status: AC

## 2013-03-29 MED ORDER — CEFTRIAXONE SODIUM 1 G IJ SOLR: 1.0000 g | INTRAMUSCULAR | Status: AC

## 2013-03-29 NOTE — Discharge Summary (Signed)
I agree with the discharge summary as documented.   Corinna Burkman MD  

## 2013-03-29 NOTE — Progress Notes (Signed)
Patient ID: [name], female   DOB: 12-09-1934, 79 y.o.   MRN: [id number]  [place] Admission History and Physical Service Pager: [phone]  Patient name: [name] Medical record number: [id number] Date of birth: 14-Feb-1935 Age: 79 y.o. Gender: female  Primary Care Provider: Garret Reddish, MD Consultants: Hospice  Code Status: DNR  History of Present Illness: [name] is a 79 y.o. female presenting after hospitalization for urosepsis with Klebsiella and influenza A. She was stable and did not require critical care. Her Klebsiella was treated with Rocephin and influenza A with tamiflu. Other issues that were managed were all chronic. At this time, she has returned to [facility] and is in usual state of health. Her baseline is severely debilitated from dementia and CVA. The nurses and CNA do not have any acute concerns at this time.   Review Of Systems: Per HPI with the following additions: none Otherwise 12 point review of systems was performed and was unremarkable.  Patient Active Problem List   Diagnosis Date Noted  . Influenza with pneumonia 03/26/2013  . UTI (urinary tract infection) 03/25/2013  . Sepsis 03/25/2013  . C. difficile colitis 02/23/2013  . Coagulase negative Staphylococcus bacteremia 02/12/2013  . Closed fracture of right femur 01/24/2013  . Anemia 05/25/2012  . DVT (deep venous thrombosis) 05/12/2012  . Mitral stenosis 02/15/2012  . Sacral pressure sore 02/15/2012  . Hypertension 12/27/2011  . Aortic stenosis 12/11/2011  . CKD (chronic kidney disease) stage 2, GFR 60-89 ml/min 12/11/2011  . Breast neoplasm   . Glaucoma   . Vitamin D deficiency   . Hiatal hernia   . Hyperlipidemia   . Vascular dementia   . Allergic rhinitis    Past Medical History: Past Medical History  Diagnosis Date  . Aphasia   . Muscle weakness   . Malaise   . Fatigue   . Breast neoplasm   . Hypertension   . Glaucoma   . UTI (lower urinary tract  infection)   . Vitamin D deficiency   . Hiatal hernia   . Constipation   . Diarrhea   . Asthma   . Obesity   . Hyperlipidemia   . Alzheimer's dementia   . Depression   . Allergic rhinitis   . Dementia   . DVT (deep venous thrombosis) 2014    "put her on Xeralto" (01/20/2013)  . Pneumonia 2014  . Shortness of breath     "lately; just laying there" (01/20/2013)  . Type II diabetes mellitus   . Anemia   . History of blood transfusion     "got a unit yesterday" (01/20/2013)  . Stroke 2006; 2007    residual:  "mute, dementia, generalized weakness" (01/20/2013)  . Recurrent UTI     "at least 3 since 2008 when she went into nursing home" (01/20/2013)  . Breast cancer     "right" (01/20/2013)  . Depression   . Hospice care patient     active with [facility] - call [phone] with encounters   Past Surgical History: Past Surgical History  Procedure Laterality Date  . Joint replacement      left knee in '90s  . Breast lumpectomy Right 1990's; 2000's    "she's had 2" (01/20/2013)  . Replacement total knee bilateral Bilateral 1990's  . Tee without cardioversion N/A 02/17/2013    Procedure: TRANSESOPHAGEAL ECHOCARDIOGRAM (TEE);  Surgeon: Carla Spark, MD;  Location: [facility];  Service: Cardiovascular;  Laterality: N/A;  Social History: History  Substance Use Topics  . Smoking status: Never Smoker   . Smokeless tobacco: Never Used  . Alcohol Use: No   Additional social history: pt's sister is HCPOA Please also refer to relevant sections of EMR.  Family History: No family history on file. Allergies and Medications: Allergies  Allergen Reactions  . Penicillins Other (See Comments)    Per MAR  . Tetanus Toxoids Other (See Comments)    Per MAR  . Vicodin [Hydrocodone-Acetaminophen] Other (See Comments)    Per [facility]   Current Outpatient Prescriptions on File Prior to Visit  Medication Sig Dispense Refill  . acetaminophen (TYLENOL) 325 MG tablet Take 650 mg by mouth every 4  (four) hours as needed for fever.      Marland Kitchen atorvastatin (LIPITOR) 20 MG tablet Take 20 mg by mouth daily.      . calcium citrate (CALCITRATE - DOSED IN MG ELEMENTAL CALCIUM) 950 MG tablet Take 400 mg of elemental calcium by mouth daily.       . cefTRIAXone (ROCEPHIN) 1 G injection Inject 1 g into the muscle daily.  3 each  0  . cloNIDine (CATAPRES - DOSED IN MG/24 HR) 0.1 mg/24hr patch Place 0.1 mg onto the skin once a week.      Marland Kitchen dextromethorphan-guaiFENesin (MUCINEX DM) 30-600 MG per 12 hr tablet Take 1 tablet by mouth 2 (two) times daily.      Marland Kitchen dipyridamole-aspirin (AGGRENOX) 200-25 MG per 12 hr capsule Take 1 capsule by mouth 2 (two) times daily.      . fluticasone (FLONASE) 50 MCG/ACT nasal spray Place 1 spray into both nostrils daily.      Marland Kitchen lisinopril (PRINIVIL,ZESTRIL) 2.5 MG tablet Take 2.5 mg by mouth daily.      Marland Kitchen oseltamivir (TAMIFLU) 75 MG capsule Take 1 capsule (75 mg total) by mouth 2 (two) times daily.  6 capsule  0  . polyethylene glycol powder (GLYCOLAX/MIRALAX) powder Take 17 g by mouth daily as needed for mild constipation. FOR CONSTIPATION      . promethazine (PHENERGAN) 25 MG tablet Take 25 mg by mouth every 6 (six) hours as needed for nausea.      . ranitidine (ZANTAC) 150 MG tablet Take 150 mg by mouth every evening.      . Rivaroxaban (XARELTO) 20 MG TABS tablet Take 20 mg by mouth daily.      . Travoprost, BAK Free, (TRAVATAN) 0.004 % SOLN ophthalmic solution Place 1 drop into both eyes at bedtime.      . Vitamin D, Ergocalciferol, (DRISDOL) 50000 UNITS CAPS capsule Take 1 capsule (50,000 Units total) by mouth every 30 (thirty) days. Give q 15th day of each month  30 capsule  0   No current facility-administered medications on file prior to visit.    Objective: Vitals - All WNL, see paper chart for specific vital signs Exam: Gen: Elderly female sitting up in bed in NAD, non distressed  HEENT: NCAT, PERRL, MMM  CV: RRR, distant heart sounds  Resp: Scattered  expiratory wheezes, transmitted upper airway sounds Abd: SNTND, BS present, no guarding or organomegaly  Ext: Left lower extremity with 2-3+ edema, R leg wrapped, 2+ DP pulses bilaterally  Neuro: Alert, not oriented, no verbal communication at this time   Labs and Imaging: CBC BMET   Recent Labs Lab 03/28/13 0615  WBC 4.3  HGB 9.2*  HCT 29.3*  PLT 176    Recent Labs Lab 03/26/13 0610  NA 143  K 5.0  CL 108  CO2 22  BUN 23  CREATININE 0.94  GLUCOSE 85  CALCIUM 8.6     Assessment and Plan: [name] is a 79 y.o. female presenting with klebsiella UTI and influenza. PMH is significant for numerous remote CVA, HTN, HLD, dementia and invasive ductal cell carcinoma removed in 2005.   1. Klebsiella UTI - Cont 3 additional days of rocephin 1 g IM daily, last day 1/16  2. Influenza A - Cont tamiflu 75 mg BID for total of 5 days, last day 1/15  3. Sacral Wound, Unstageable - Cont wound care with aquacell dressing  4. Vascular Disease with numerous CVA - Cont aggrenox and lipitor   5. HTN - resume lisinopril and clonidine   6. Hx of DVT - Cont Xarelto  7. Femur Fracture, Right distal - Cont wrapping for support    Angelica Ran, MD 03/29/2013, 6:09 PM

## 2013-03-30 DIAGNOSIS — A414 Sepsis due to anaerobes: Secondary | ICD-10-CM

## 2013-03-30 HISTORY — DX: Sepsis due to anaerobes: A41.4

## 2013-03-30 NOTE — Progress Notes (Signed)
  Brief Attending NH Admission Note  Subjective:    Patient ID: [name], female    DOB: 1934/11/08, 79 y.o.   MRN: [id number]  HPI 79 yo F NH patient seen upon readmission to [facility] following 3 day hospital stay for UTI, sepsis and influenza.   Patient is non-verbal.   Discharge summary reviewed. See Resident H&P for details.   Review of Systems Unable to obtain.      Objective:   Physical Exam BP 148/77  Pulse 77  Temp(Src) 97.8 F (36.6 C)  Resp 16 General appearance: resting in bed. eyes open to voice. no distress. normal WOB Lungs: normal WOB. coarse BS b/l. no wheezing.  Heart: regular rate and rhythm, S1, S2 normal, no murmur, click, rub or gallop Extremities: 1 + LE edema. RLE wrapped from foot to femur (of note patient has R femur fracture from 11/15)  Blood Cx 03/26/12: NGTD x 2  Urine CX 03/26/12:> 100 K Klebsiella Pneumoniae sens to CTX     Assessment & Plan:

## 2013-03-30 NOTE — Assessment & Plan Note (Signed)
A: improving. No 02 requirement or fever. P: Complete course of tamiflu today.

## 2013-03-30 NOTE — Assessment & Plan Note (Signed)
Patient is non-weight bearing for 10 weeks 01/20/13-03/31/12 per ortho recommendations with soft cast. Pain appears well controlled.  Continue soft cast.  Plan to repeat R femur x-ray to evaluate fracture if signs of healing, patient can be removed from soft cast and can have partial weight bearing.

## 2013-03-30 NOTE — Assessment & Plan Note (Signed)
A: urine isolate. Patient on CTX. No Hypotension, fever, TC or AMS.  P; Continue CTX stop date 03/31/13

## 2013-03-31 ENCOUNTER — Encounter: Payer: Self-pay | Admitting: Family Medicine

## 2013-04-01 LAB — CULTURE, BLOOD (ROUTINE X 2)
Culture: NO GROWTH
Culture: NO GROWTH

## 2013-04-05 ENCOUNTER — Telehealth: Payer: Self-pay | Admitting: Family Medicine

## 2013-04-05 NOTE — Telephone Encounter (Signed)
Hospice RN calling to say that pt have hematuria and cloudy urine in foley bag. No fever or changes in behavior. Pt recently hospitalized with sepsis due to klebsiella UTI. Pt on Hospice due to advanced neurologic deficits, but family still wants infections treated. Given recent sepsis and pt's difficulty to assess illness, I will do the following 1. Remove current catheter 2. Place new catheter and draw sample for u/a, culture, gram stain 3. Ceftriaxone 1 g IM now.   RN voiced understanding.

## 2013-04-21 ENCOUNTER — Encounter: Payer: Self-pay | Admitting: Family Medicine

## 2013-04-21 ENCOUNTER — Non-Acute Institutional Stay (INDEPENDENT_AMBULATORY_CARE_PROVIDER_SITE_OTHER): Payer: Medicare Other | Admitting: Family Medicine

## 2013-04-21 DIAGNOSIS — I82409 Acute embolism and thrombosis of unspecified deep veins of unspecified lower extremity: Secondary | ICD-10-CM

## 2013-04-21 DIAGNOSIS — N182 Chronic kidney disease, stage 2 (mild): Secondary | ICD-10-CM

## 2013-04-21 DIAGNOSIS — E785 Hyperlipidemia, unspecified: Secondary | ICD-10-CM

## 2013-04-21 DIAGNOSIS — L89159 Pressure ulcer of sacral region, unspecified stage: Secondary | ICD-10-CM

## 2013-04-21 DIAGNOSIS — L89109 Pressure ulcer of unspecified part of back, unspecified stage: Secondary | ICD-10-CM

## 2013-04-21 DIAGNOSIS — E559 Vitamin D deficiency, unspecified: Secondary | ICD-10-CM

## 2013-04-21 DIAGNOSIS — I359 Nonrheumatic aortic valve disorder, unspecified: Secondary | ICD-10-CM

## 2013-04-21 DIAGNOSIS — L8990 Pressure ulcer of unspecified site, unspecified stage: Secondary | ICD-10-CM

## 2013-04-21 DIAGNOSIS — F0150 Vascular dementia without behavioral disturbance: Secondary | ICD-10-CM

## 2013-04-21 DIAGNOSIS — I10 Essential (primary) hypertension: Secondary | ICD-10-CM

## 2013-04-21 DIAGNOSIS — I350 Nonrheumatic aortic (valve) stenosis: Secondary | ICD-10-CM

## 2013-04-21 NOTE — Assessment & Plan Note (Signed)
Well controlled. Renal protective effects likely minimal given life expectancy/hospice. Could consider removing lisinopril. Patient has been hypotensive when becomes ill.

## 2013-04-21 NOTE — Assessment & Plan Note (Signed)
Taking 50000 units of vitamin D every 30 days. No recheck planned. Palliative/hospice.

## 2013-04-21 NOTE — Assessment & Plan Note (Signed)
Regular monitoring no longer indicated as on hospice.

## 2013-04-21 NOTE — Assessment & Plan Note (Signed)
Continue aggrenox and statin. No longer on aricept which offered little/no benefit.

## 2013-04-21 NOTE — Assessment & Plan Note (Signed)
Continue Xarelto for now although future discussion with sister [name] could be warranted as part of GOC-may defer for hospice to discuss with [facility].

## 2013-04-21 NOTE — Assessment & Plan Note (Signed)
Weight stable. Non operative. No obvious need for diuresis at this time.

## 2013-04-21 NOTE — Assessment & Plan Note (Signed)
Continue wound care through nursing home.

## 2013-04-21 NOTE — Assessment & Plan Note (Signed)
Continued to prevent repeat stroke.

## 2013-04-21 NOTE — Progress Notes (Signed)
Garret Reddish, MD Phone: [phone]  Subjective:  Nursing home visit. Room 226A Patient's care is being coordinated currently with Hospice. Did not see any recent [facility] notes.   Since I have seen patient last, She was admitted to the hospital again (less than 3x in previous 2 months). During the most recent stay  From 03/25/13 to 03/28/13 the patient was diagnosed with sepsis from UTI and influenza. She was treated with levofloxacin and tamiflu and returned to her normal state (will track and stare blankly but few reponses). Was noted to have a stage IV sacral wound and this was treated in house as well as by wound care once [facility]. Aggrenox and lipitor have been continued for vascular dementia. Patient had lisinopril initially held but this has since been restarted. A right distal femur fracture with swelling in RLE had ace wrapping placed.   CHF due to valvular disease-Aortic Stenosis vs. Regurgitation. CKD Echocardiogram performed on 09/08/2012 showed ejection fraction of 55-60%, LVH present, mild to moderate aortic regurgitation. Aortic sclerosis without stenosis. This varies significantly from 12/07/11 echocardiogram report which states severe stenosis and mild regurgitation. Echocardiogram during hospitalization showed once again "severe stenosis. Mild regurgitation."  Weight has been stable near 230 in last 2 months.  Patient is on 2L I believe chronically now.  Cr was 0.94 most recently corresponding to gfr of 67 (stage II CKD)  Hypertension/history CVA/vascular dementia Hypotensive in hospital in january. Now only on lisinopril and clonidine.  On Aggrenox for history CVA as well as statin.   DVT-tolerating xarelto. Patient did not have recurrence of DVT when off of xarelto but quickly formed a superficial clot and given quick occurrence of this, decision made to permanently continued unless under [place] this is changed.    ROS-cannot obtain due to dementia Past  Medical History- Patient Active Problem List   Diagnosis Date Noted  . Aortic stenosis 12/11/2011    Priority: High  . Vascular dementia     Priority: High  . Closed fracture of right femur 01/24/2013    Priority: Medium  . DVT (deep venous thrombosis) 05/12/2012    Priority: Medium  . Hypertension 12/27/2011    Priority: Medium  . CKD (chronic kidney disease) stage 2, GFR 60-89 ml/min 12/11/2011    Priority: Medium  . Breast neoplasm     Priority: Medium  . Glaucoma     Priority: Medium  . Vitamin D deficiency     Priority: Medium  . Hyperlipidemia     Priority: Medium  . Anemia 05/25/2012    Priority: Low  . Mitral stenosis 02/15/2012    Priority: Low  . Sacral pressure sore 02/15/2012    Priority: Low  . Hiatal hernia     Priority: Low  . Allergic rhinitis     Priority: Low   Medications- reviewed and updated Current Outpatient Prescriptions  Medication Sig Dispense Refill  . acetaminophen (TYLENOL) 325 MG tablet Take 650 mg by mouth every 4 (four) hours as needed for fever.      Marland Kitchen atorvastatin (LIPITOR) 20 MG tablet Take 20 mg by mouth daily.      . calcium citrate (CALCITRATE - DOSED IN MG ELEMENTAL CALCIUM) 950 MG tablet Take 400 mg of elemental calcium by mouth daily.       . cloNIDine (CATAPRES - DOSED IN MG/24 HR) 0.1 mg/24hr patch Place 0.1 mg onto the skin once a week.      Marland Kitchen dextromethorphan-guaiFENesin (MUCINEX DM) 30-600 MG per 12 hr  tablet Take 1 tablet by mouth 2 (two) times daily.      Marland Kitchen dipyridamole-aspirin (AGGRENOX) 200-25 MG per 12 hr capsule Take 1 capsule by mouth 2 (two) times daily.      . fluticasone (FLONASE) 50 MCG/ACT nasal spray Place 1 spray into both nostrils daily.      Marland Kitchen lisinopril (PRINIVIL,ZESTRIL) 2.5 MG tablet Take 2.5 mg by mouth daily.      . polyethylene glycol powder (GLYCOLAX/MIRALAX) powder Take 17 g by mouth daily as needed for mild constipation. FOR CONSTIPATION      . promethazine (PHENERGAN) 25 MG tablet Take 25 mg by mouth  every 6 (six) hours as needed for nausea.      . ranitidine (ZANTAC) 150 MG tablet Take 150 mg by mouth every evening.      . Rivaroxaban (XARELTO) 20 MG TABS tablet Take 20 mg by mouth daily.      . Travoprost, BAK Free, (TRAVATAN) 0.004 % SOLN ophthalmic solution Place 1 drop into both eyes at bedtime.      . Vitamin D, Ergocalciferol, (DRISDOL) 50000 UNITS CAPS capsule Take 1 capsule (50,000 Units total) by mouth every 30 (thirty) days. Give q 15th day of each month  30 capsule  0   No current facility-administered medications for this visit.    Objective: BP 134/68  Pulse 66  Temp(Src) 97.5 F (36.4 C)  Resp 18  Wt 227 lb (102.967 kg)  SpO2 95% Gen: NAD, tracks with eyes. Does not answer any questions.  HEENT: Mucous membranes are moist. CV: RRR, unchanged 4/6 SEM at LUSB, s2 still audible Abdomen: soft/nontender/nondistended Lungs: CTAB anteriorly, no wheezes rales or rhonchi. Patient will not participate with sitting up or rolling over. Non labored breathing. On 2L [place] Ext: 2+ in left, 1+ in right; previous fracture of right femur with deformity noted. Right lower extremity wrapped in ace.  Neuro: does not follow commands with exception of sticking her tongue out.   Assessment/Plan:  Goals of Care After getting out of hospital, Family met with hospice and patient was transitioned to hospice care. Infections are still to be treated. Patient is DNR/DNI. Avoid tube feeds. These decisions were made with sister [name] who is the decision maker. May need to discuss removing some of current medications in future as indications are limited if life expectancy <6 months (such as lisinopril and CKD)   Vascular dementia Continue aggrenox and statin. No longer on aricept which offered little/no benefit.   Aortic stenosis Weight stable. Non operative. No obvious need for diuresis at this time.   Vitamin D deficiency Taking 50000 units of vitamin D every 30 days. No recheck planned.  Palliative/hospice.   Hypertension Well controlled. Renal protective effects likely minimal given life expectancy/hospice. Could consider removing lisinopril. Patient has been hypotensive when becomes ill.   Hyperlipidemia Continued to prevent repeat stroke.   DVT (deep venous thrombosis) Continue Xarelto for now although future discussion with sister [name] could be warranted as part of GOC-may defer for hospice to discuss with [facility].   CKD (chronic kidney disease) stage 2, GFR 60-89 ml/min Regular monitoring no longer indicated as on hospice.   Sacral pressure sore Continue wound care through nursing home.

## 2013-04-25 LAB — BASIC METABOLIC PANEL
BUN: 22 mg/dL — AB (ref 4–21)
Creatinine: 0.9 mg/dL (ref 0.5–1.1)
GLUCOSE: 88 mg/dL
Potassium: 4.7 mmol/L (ref 3.4–5.3)
Sodium: 141 mmol/L (ref 137–147)

## 2013-04-25 LAB — HEPATIC FUNCTION PANEL
ALK PHOS: 55 U/L (ref 25–125)
ALT: 11 U/L (ref 7–35)
AST: 8 U/L — AB (ref 13–35)
BILIRUBIN, TOTAL: 0.3 mg/dL

## 2013-04-25 LAB — CBC AND DIFFERENTIAL
HCT: 34 % — AB (ref 36–46)
Hemoglobin: 10.2 g/dL — AB (ref 12.0–16.0)
PLATELETS: 213 10*3/uL (ref 150–399)
WBC: 5.4 10*3/mL

## 2013-04-25 LAB — LIPID PANEL
Cholesterol: 189 mg/dL (ref 0–200)
HDL: 38 mg/dL (ref 35–70)
LDL CALC: 127 mg/dL
Triglycerides: 122 mg/dL (ref 40–160)

## 2013-04-29 ENCOUNTER — Encounter: Payer: Self-pay | Admitting: Family Medicine

## 2013-04-29 LAB — ALBUMIN: Albumin: 2.9 — AB (ref 3.5–5.2)

## 2013-05-03 ENCOUNTER — Encounter (HOSPITAL_COMMUNITY): Payer: Self-pay | Admitting: Pharmacist

## 2013-05-07 ENCOUNTER — Encounter (HOSPITAL_COMMUNITY): Payer: Self-pay | Admitting: Emergency Medicine

## 2013-05-07 ENCOUNTER — Emergency Department (HOSPITAL_COMMUNITY)

## 2013-05-07 ENCOUNTER — Emergency Department (HOSPITAL_COMMUNITY)
Admission: EM | Admit: 2013-05-07 | Discharge: 2013-05-07 | Disposition: A | Discharge status: Home / Self Care | Attending: Emergency Medicine | Admitting: Emergency Medicine

## 2013-05-07 DIAGNOSIS — W19XXXA Unspecified fall, initial encounter: Secondary | ICD-10-CM

## 2013-05-07 DIAGNOSIS — Z7901 Long term (current) use of anticoagulants: Secondary | ICD-10-CM | POA: Insufficient documentation

## 2013-05-07 DIAGNOSIS — Z853 Personal history of malignant neoplasm of breast: Secondary | ICD-10-CM | POA: Insufficient documentation

## 2013-05-07 DIAGNOSIS — E785 Hyperlipidemia, unspecified: Secondary | ICD-10-CM | POA: Insufficient documentation

## 2013-05-07 DIAGNOSIS — E119 Type 2 diabetes mellitus without complications: Secondary | ICD-10-CM | POA: Insufficient documentation

## 2013-05-07 DIAGNOSIS — Z8701 Personal history of pneumonia (recurrent): Secondary | ICD-10-CM | POA: Insufficient documentation

## 2013-05-07 DIAGNOSIS — Y939 Activity, unspecified: Secondary | ICD-10-CM | POA: Insufficient documentation

## 2013-05-07 DIAGNOSIS — W06XXXA Fall from bed, initial encounter: Secondary | ICD-10-CM | POA: Insufficient documentation

## 2013-05-07 DIAGNOSIS — G309 Alzheimer's disease, unspecified: Secondary | ICD-10-CM | POA: Insufficient documentation

## 2013-05-07 DIAGNOSIS — Z862 Personal history of diseases of the blood and blood-forming organs and certain disorders involving the immune mechanism: Secondary | ICD-10-CM | POA: Insufficient documentation

## 2013-05-07 DIAGNOSIS — F3289 Other specified depressive episodes: Secondary | ICD-10-CM | POA: Insufficient documentation

## 2013-05-07 DIAGNOSIS — T148XXA Other injury of unspecified body region, initial encounter: Secondary | ICD-10-CM

## 2013-05-07 DIAGNOSIS — E669 Obesity, unspecified: Secondary | ICD-10-CM | POA: Insufficient documentation

## 2013-05-07 DIAGNOSIS — E559 Vitamin D deficiency, unspecified: Secondary | ICD-10-CM | POA: Insufficient documentation

## 2013-05-07 DIAGNOSIS — I10 Essential (primary) hypertension: Secondary | ICD-10-CM | POA: Insufficient documentation

## 2013-05-07 DIAGNOSIS — S0510XA Contusion of eyeball and orbital tissues, unspecified eye, initial encounter: Secondary | ICD-10-CM | POA: Insufficient documentation

## 2013-05-07 DIAGNOSIS — Z8673 Personal history of transient ischemic attack (TIA), and cerebral infarction without residual deficits: Secondary | ICD-10-CM | POA: Insufficient documentation

## 2013-05-07 DIAGNOSIS — Z8619 Personal history of other infectious and parasitic diseases: Secondary | ICD-10-CM | POA: Insufficient documentation

## 2013-05-07 DIAGNOSIS — F0280 Dementia in other diseases classified elsewhere without behavioral disturbance: Secondary | ICD-10-CM | POA: Insufficient documentation

## 2013-05-07 DIAGNOSIS — Z87440 Personal history of urinary (tract) infections: Secondary | ICD-10-CM | POA: Insufficient documentation

## 2013-05-07 DIAGNOSIS — Z86718 Personal history of other venous thrombosis and embolism: Secondary | ICD-10-CM | POA: Insufficient documentation

## 2013-05-07 DIAGNOSIS — Z79899 Other long term (current) drug therapy: Secondary | ICD-10-CM | POA: Insufficient documentation

## 2013-05-07 DIAGNOSIS — F329 Major depressive disorder, single episode, unspecified: Secondary | ICD-10-CM | POA: Insufficient documentation

## 2013-05-07 DIAGNOSIS — IMO0002 Reserved for concepts with insufficient information to code with codable children: Secondary | ICD-10-CM | POA: Insufficient documentation

## 2013-05-07 DIAGNOSIS — K5900 Constipation, unspecified: Secondary | ICD-10-CM | POA: Insufficient documentation

## 2013-05-07 DIAGNOSIS — J45909 Unspecified asthma, uncomplicated: Secondary | ICD-10-CM | POA: Insufficient documentation

## 2013-05-07 DIAGNOSIS — Y929 Unspecified place or not applicable: Secondary | ICD-10-CM | POA: Insufficient documentation

## 2013-05-07 DIAGNOSIS — Z880 Allergy status to penicillin: Secondary | ICD-10-CM | POA: Insufficient documentation

## 2013-05-07 NOTE — ED Notes (Signed)
Spoke with pts sister. She reported that the pt had a right femur fracture which had been repaired which she would like to be checked for reinjury. Dr. Marnette Burgess notified.

## 2013-05-07 NOTE — ED Notes (Addendum)
Report called to [place] at [place].

## 2013-05-07 NOTE — ED Notes (Signed)
Per EMS, pt coming from [place]. According to staff pt had an unwittnessed fall out of bed, pt is at baseline per staff which is alert to self only. Pt is taking xarelto for a DVT.  Pt has history of dementia. Pt has quarter size hematoma under right eye. Pt is on hospice for breast cancer. Pt is on 4L [place] at home. CBG: 121

## 2013-05-07 NOTE — ED Provider Notes (Signed)
CSN: [id number]     Arrival date & time 05/07/13  0148 History   First MD Initiated Contact with Patient 05/07/13 0308     Chief Complaint  Patient presents with  . Fall     (Consider location/radiation/quality/duration/timing/severity/associated sxs/prior Treatment) HPI History provided by EMS and nursing home report. At nursing facility had unwitnessed fall out of bed. Patient is nonverbal at baseline due to previous stroke. She is also bed bound.  She is on blood thinners for history of DVT. Reported baseline mental status. No obvious injury other than small hematoma under right eye. Patient is awake and alert but does not provide any history. Level V caveat applies Past Medical History  Diagnosis Date  . Aphasia   . Muscle weakness   . Malaise   . Fatigue   . Breast neoplasm   . Hypertension   . Glaucoma   . UTI (lower urinary tract infection)   . Vitamin D deficiency   . Hiatal hernia   . Constipation   . Diarrhea   . Asthma   . Obesity   . Hyperlipidemia   . Alzheimer's dementia   . Depression   . Allergic rhinitis   . Dementia   . DVT (deep venous thrombosis) 2014    "put her on Xeralto" (01/20/2013)  . Pneumonia 2014  . Shortness of breath     "lately; just laying there" (01/20/2013)  . Type II diabetes mellitus   . Anemia   . History of blood transfusion     "got a unit yesterday" (01/20/2013)  . Stroke 2006; 2007    residual:  "mute, dementia, generalized weakness" (01/20/2013)  . Recurrent UTI     "at least 3 since 2008 when she went into nursing home" (01/20/2013)  . Breast cancer     "right" (01/20/2013)  . Depression   . Hospice care patient     active with [facility] - call [phone] with encounters  . C. difficile colitis 02/23/2013  . Coagulase negative Staphylococcus bacteremia 02/12/2013  . Sepsis due to Klebsiella pneumoniae 03/30/2013   Past Surgical History  Procedure Laterality Date  . Joint replacement      left knee in '90s  . Breast lumpectomy  Right 1990's; 2000's    "she's had 2" (01/20/2013)  . Replacement total knee bilateral Bilateral 1990's  . Tee without cardioversion N/A 02/17/2013    Procedure: TRANSESOPHAGEAL ECHOCARDIOGRAM (TEE);  Surgeon: Dorothy Spark, MD;  Location: [facility] ENDOSCOPY;  Service: Cardiovascular;  Laterality: N/A;   No family history on file. History  Substance Use Topics  . Smoking status: Never Smoker   . Smokeless tobacco: Never Used  . Alcohol Use: No   OB History   Grav Para Term Preterm Abortions TAB SAB Ect Mult Living                 Review of Systems  Unable to perform ROS  level V caveat for nonverbal    Allergies  Penicillins; Tetanus toxoids; and Vicodin  Home Medications   Current Outpatient Rx  Name  Route  Sig  Dispense  Refill  . atorvastatin (LIPITOR) 20 MG tablet   Oral   Take 20 mg by mouth daily.         . calcium citrate (CALCITRATE - DOSED IN MG ELEMENTAL CALCIUM) 950 MG tablet   Oral   Take 400 mg of elemental calcium by mouth daily.          . cloNIDine (CATAPRES -  DOSED IN MG/24 HR) 0.1 mg/24hr patch   Transdermal   Place 0.1 mg onto the skin once a week. On Friday         . collagenase (SANTYL) ointment   Topical   Apply 1 application topically daily as needed (to bottom with each diaper change).         Marland Kitchen dipyridamole-aspirin (AGGRENOX) 200-25 MG per 12 hr capsule   Oral   Take 1 capsule by mouth 2 (two) times daily.         . fluticasone (FLONASE) 50 MCG/ACT nasal spray   Each Nare   Place 1 spray into both nostrils daily.         Marland Kitchen guaiFENesin (MUCINEX) 600 MG 12 hr tablet   Oral   Take 600 mg by mouth daily.         Marland Kitchen lisinopril (PRINIVIL,ZESTRIL) 2.5 MG tablet   Oral   Take 2.5 mg by mouth daily.         . magnesium hydroxide (MILK OF MAGNESIA) 400 MG/5ML suspension   Oral   Take 30 mLs by mouth daily as needed for mild constipation.         . ranitidine (ZANTAC) 150 MG tablet   Oral   Take 150 mg by mouth every  evening.         . Rivaroxaban (XARELTO) 20 MG TABS tablet   Oral   Take 20 mg by mouth daily.         . Travoprost, BAK Free, (TRAVATAN) 0.004 % SOLN ophthalmic solution   Both Eyes   Place 1 drop into both eyes at bedtime.         . Vitamin D, Ergocalciferol, (DRISDOL) 50000 UNITS CAPS capsule   Oral   Take 1 capsule (50,000 Units total) by mouth every 30 (thirty) days. Give q 15th day of each month   30 capsule   0   . acetaminophen (TYLENOL) 325 MG tablet   Oral   Take 650 mg by mouth every 4 (four) hours as needed for fever.         . polyethylene glycol powder (GLYCOLAX/MIRALAX) powder   Oral   Take 17 g by mouth daily as needed for mild constipation. FOR CONSTIPATION         . promethazine (PHENERGAN) 25 MG tablet   Oral   Take 25 mg by mouth every 6 (six) hours as needed for nausea.          BP 115/53  Pulse 73  Temp(Src) 97.5 F (36.4 C) (Oral)  Resp 22  SpO2 100% Physical Exam  Constitutional: She appears well-developed and well-nourished.  HENT:  Head: Normocephalic.  Mild hematoma under right eye. No entrapment with extraocular movements intact. No epistaxis. No midface instability.  Eyes: EOM are normal. Pupils are equal, round, and reactive to light.  Neck: Neck supple.  Cardiovascular: Normal rate, regular rhythm and intact distal pulses.   Pulmonary/Chest: Effort normal and breath sounds normal. No respiratory distress.  Abdominal: Soft. She exhibits no distension. There is no tenderness.  Musculoskeletal:  Lower extremities elevated with padding under bilateral heels. No apparent areas of bony tenderness or deformity. Distal pulses equal and intact  Neurological:  Awake and alert, fixes and follows, nonverbal  Skin: Skin is warm and dry.    ED Course  Procedures (including critical care time) Labs Review Labs Reviewed - No data to display Imaging Review Dg Femur Right  05/07/2013   CLINICAL DATA:  Status post fall; distal right  femoral pain.  EXAM: RIGHT FEMUR - 2 VIEW  COMPARISON:  Right knee and femur radiographs performed 01/20/2013  FINDINGS: There is mild heterotopic bone formation about the mildly comminuted displaced fracture at the distal femur, as seen on the prior study. This appears grossly unchanged; it could reflect a recurrent fracture through the patient's prior fracture, or possibly incomplete healing of the prior fracture. The patient's knee arthroplasty is grossly unremarkable in appearance. An associated trace knee joint effusion is seen.  The proximal femur is unremarkable in appearance. Mild degenerative change is noted at the right hip joint.  IMPRESSION: 1. Mild heterotopic bone formation about the previously noted mildly comminuted displaced fracture of the distal femur. The fracture itself appears grossly unchanged; it could reflect a recurrent fracture through the patient's prior fracture, or incomplete healing of the prior fracture. 2. Underlying trace knee joint effusion seen.   Electronically Signed   By: Garald Balding M.D.   On: 05/07/2013 05:45   Ct Head Wo Contrast  05/07/2013   CLINICAL DATA:  Unwitnessed fall out bed. Soft tissue hematoma under the right orbit. Concern for cervical spine injury.  EXAM: CT HEAD WITHOUT CONTRAST  CT CERVICAL SPINE WITHOUT CONTRAST  TECHNIQUE: Multidetector CT imaging of the head and cervical spine was performed following the standard protocol without intravenous contrast. Multiplanar CT image reconstructions of the cervical spine were also generated.  COMPARISON:  MR HEAD W/O CM dated 01/14/2013; CT HEAD W/O CM dated 01/14/2013  FINDINGS: CT HEAD FINDINGS  There is no evidence of acute infarction, mass lesion, or intra- or extra-axial hemorrhage on CT.  Prominence of the ventricles and sulci reflects moderately severe cortical volume loss. Diffuse periventricular and subcortical white matter change likely reflects small vessel ischemic microangiopathy. Chronic  encephalomalacia is noted at the right frontal and posterior left parietal lobes. Cerebellar atrophy is seen.  The brainstem and fourth ventricle are within normal limits. The basal ganglia are unremarkable in appearance. The cerebral hemispheres demonstrate grossly normal gray-white differentiation. No mass effect or midline shift is seen.  There is no evidence of fracture; visualized osseous structures are unremarkable in appearance. The visualized portions of the orbits are within normal limits. The paranasal sinuses and mastoid air cells are well-aerated. Soft tissue swelling is noted overlying the right orbit.  CT CERVICAL SPINE FINDINGS  There is no evidence of fracture or subluxation. Vertebral bodies demonstrate normal height and alignment. Multilevel disc space narrowing is noted along the cervical spine, with associated anterior and posterior disc osteophyte complexes. Prevertebral soft tissues are within normal limits. The visualized neural foramina are grossly unremarkable.  The thyroid gland is unremarkable in appearance. The visualized lung apices are clear. Calcification is noted at the carotid bifurcations bilaterally, somewhat more prominent on the left.  IMPRESSION: 1. No evidence of traumatic intracranial injury or fracture. 2. No evidence of fracture or subluxation along the cervical spine. 3. Mild soft tissue swelling noted overlying the right orbit. 4. Moderately severe cortical volume loss and diffuse small vessel ischemic microangiopathy. 5. Chronic encephalomalacia at the right frontal and posterior left parietal lobes, reflecting remote infarct. 6. Minimal degenerative change along the cervical spine. 7. Calcification at the carotid bifurcations bilaterally, somewhat more prominent on the left. Carotid ultrasound would be helpful for further evaluation, when and as deemed clinically appropriate.   Electronically Signed   By: Garald Balding M.D.   On: 05/07/2013 04:44   Ct Cervical Spine  Wo Contrast  05/07/2013   CLINICAL DATA:  Unwitnessed fall out bed. Soft tissue hematoma under the right orbit. Concern for cervical spine injury.  EXAM: CT HEAD WITHOUT CONTRAST  CT CERVICAL SPINE WITHOUT CONTRAST  TECHNIQUE: Multidetector CT imaging of the head and cervical spine was performed following the standard protocol without intravenous contrast. Multiplanar CT image reconstructions of the cervical spine were also generated.  COMPARISON:  MR HEAD W/O CM dated 01/14/2013; CT HEAD W/O CM dated 01/14/2013  FINDINGS: CT HEAD FINDINGS  There is no evidence of acute infarction, mass lesion, or intra- or extra-axial hemorrhage on CT.  Prominence of the ventricles and sulci reflects moderately severe cortical volume loss. Diffuse periventricular and subcortical white matter change likely reflects small vessel ischemic microangiopathy. Chronic encephalomalacia is noted at the right frontal and posterior left parietal lobes. Cerebellar atrophy is seen.  The brainstem and fourth ventricle are within normal limits. The basal ganglia are unremarkable in appearance. The cerebral hemispheres demonstrate grossly normal gray-white differentiation. No mass effect or midline shift is seen.  There is no evidence of fracture; visualized osseous structures are unremarkable in appearance. The visualized portions of the orbits are within normal limits. The paranasal sinuses and mastoid air cells are well-aerated. Soft tissue swelling is noted overlying the right orbit.  CT CERVICAL SPINE FINDINGS  There is no evidence of fracture or subluxation. Vertebral bodies demonstrate normal height and alignment. Multilevel disc space narrowing is noted along the cervical spine, with associated anterior and posterior disc osteophyte complexes. Prevertebral soft tissues are within normal limits. The visualized neural foramina are grossly unremarkable.  The thyroid gland is unremarkable in appearance. The visualized lung apices are clear.  Calcification is noted at the carotid bifurcations bilaterally, somewhat more prominent on the left.  IMPRESSION: 1. No evidence of traumatic intracranial injury or fracture. 2. No evidence of fracture or subluxation along the cervical spine. 3. Mild soft tissue swelling noted overlying the right orbit. 4. Moderately severe cortical volume loss and diffuse small vessel ischemic microangiopathy. 5. Chronic encephalomalacia at the right frontal and posterior left parietal lobes, reflecting remote infarct. 6. Minimal degenerative change along the cervical spine. 7. Calcification at the carotid bifurcations bilaterally, somewhat more prominent on the left. Carotid ultrasound would be helpful for further evaluation, when and as deemed clinically appropriate.   Electronically Signed   By: Garald Balding M.D.   On: 05/07/2013 04:44    Discussed with nursing facility and patient's sister expressed concern about previous right hip surgery. No obvious formerly right lower extremity. Straight obtained and reviewed as above. Plan close outpatient followup.   CT scans reviewed as above. No change on repeat exam. Plan discharge back to facility.  MDM   Diagnosis: Fall  CT scan head for anticoagulation on xarelto. No injuries to C spine by review of Ct scan. Right femur x-ray reviewed as above. Given nonverbal, no obvious injury deformity, plan close outpatient followup. Vital signs nursing notes reviewed and considered     Teressa Lower, MD 05/07/13 0630

## 2013-05-07 NOTE — ED Notes (Signed)
[facility], they reported that the patient had an unwitnessed fall out of the bed after the CNA adjusted the pts head to be up in the bed.

## 2013-05-07 NOTE — Discharge Instructions (Signed)
Be certain to keep bed rails up at all times  Fall Prevention in Hospitals  As a hospital patient, your condition and the treatments you receive can increase your risk for falls. Some additional risk factors for falls in a hospital include:  Being in an unfamiliar environment.  Being on bed rest.  Your surgery.  Taking certain medicines.  Your tubing requirements, such as intravenous (IV) therapy or catheters. It is important that you learn how to decrease fall risks while at the hospital. Below are important tips that can help prevent falls.  SAFETY TIPS FOR PREVENTING FALLS  Talk about your risk of falling.  Ask your caregiver why you are at risk for falling. Is it your medicine, illness, tubing placement, or something else?  Make a plan with your caregiver to keep you safe from falls.  Ask your caregiver or pharmacist about side effect of your medicines. Some medicines can make you dizzy or affect your coordination. Ask for help.  Ask for help before getting out of bed. You may need to press your call button.  Ask for assistance in getting you safely to the toilet.  Ask for a walker or cane to be put at your bedside. Ask that most of the side rails on your bed be placed up before your caregiver leaves the room.  Ask family or friends to sit with you.  Ask for things that are out of your reach, such as your glasses, hearing aids, telephone, bedside table, or call button. Follow these tips to avoid falling:  Stay lying or seated, rather than standing, while waiting for help.  Wear rubber-soled slippers or shoes whenever you walk in the hospital.  Avoid quick, sudden movements.  Change positions slowly.  Sit on the side of your bed before standing.  Stand up slowly and wait before you start to walk.  Let your caregiver know if there is a spill on the floor.  Pay careful attention to the medical equipment, electrical cords, and tubes around you.  When you need help, use your call button  by your bed or in the bathroom. Wait for one of your caregivers to help you.  If you feel dizzy or unsure of your footing, return to bed and wait for assistance.  Avoid being distracted by the TV, telephone, or another person in your room.  Do not lean or support yourself on rolling objects, such as IV poles or bedside tables. Document Released: 02/28/2000 Document Revised: 02/17/2012 Document Reviewed: 11/08/2011  [facility] Patient Information 2014 [place], [place].

## 2013-05-07 NOTE — ED Notes (Signed)
Patient transported to X-ray 

## 2013-05-07 NOTE — ED Notes (Signed)
Patient transported to CT 

## 2013-05-08 ENCOUNTER — Telehealth: Payer: Self-pay | Admitting: Sports Medicine

## 2013-05-09 NOTE — Telephone Encounter (Signed)
I called and spoke with the patient's daughter regarding events over the weekend at [facility] home.  The patient reportedly was found on the floor under unclear circumstances.  Patient was evaluated at the emergency department and there is a questionable new fracture in the prior healing callus of her right femur.  Outpatient followup was recommended.  The daughter was was agreeable to outpatient followup with the pt's primary orthopedist.  Patient is nonweightbearing at this time.  No further intervention indicated at this time.  Facial trauma also noted with a negative CT of the head.  Plan will be for outpatient orthopedic followup, continue air mattress overlay per family request although a nonslip mattresses is what would be preferred at this time due to the unclear circumstances and potential slipping from bed.  Gerda Diss, DO [facility] Resident - PGY-3 05/08/13 11:00

## 2013-05-31 ENCOUNTER — Non-Acute Institutional Stay: Admitting: Family Medicine

## 2013-05-31 DIAGNOSIS — S7291XA Unspecified fracture of right femur, initial encounter for closed fracture: Secondary | ICD-10-CM

## 2013-05-31 DIAGNOSIS — S7290XA Unspecified fracture of unspecified femur, initial encounter for closed fracture: Secondary | ICD-10-CM

## 2013-05-31 DIAGNOSIS — I10 Essential (primary) hypertension: Secondary | ICD-10-CM

## 2013-06-01 MED ORDER — ACETAMINOPHEN 325 MG PO TABS: 650.0000 mg | ORAL_TABLET | Freq: Three times a day (TID) | ORAL | Status: DC | PRN

## 2013-06-01 MED ORDER — ACETAMINOPHEN ER 650 MG PO TBCR
650.0000 mg | EXTENDED_RELEASE_TABLET | Freq: Three times a day (TID) | ORAL | Status: DC | PRN
Start: 2013-06-01 — End: 2013-08-22

## 2013-06-03 ENCOUNTER — Encounter: Payer: Self-pay | Admitting: *Deleted

## 2013-06-05 MED ORDER — ACETAMINOPHEN 325 MG PO TABS: 650.0000 mg | ORAL_TABLET | Freq: Two times a day (BID) | ORAL | Status: DC | PRN

## 2013-06-05 NOTE — Assessment & Plan Note (Addendum)
A: patient having more pain. Took two dose of tylenol 650 in past week P: Scheduled tylenol 650 TID with an additional 650 BID between scheduled doses as needed

## 2013-06-05 NOTE — Progress Notes (Signed)
  [facility] Attending Progress Note  Subjective:    Patient ID: [name], female    DOB: 1935-02-11, 79 y.o.   MRN: [id number]  HPI Patient seen for routine f/u:  Concerns/complaints:  1. Pain: patient is indicating more pain than she usually does over the past 2-3 days. Pain with bathing. Pain with changes. Pain in in her R hip. She is bed bound.   2. HTN: on low dose prinzide and clonidine patch. Denies HA, CP, SOB.    Review of Systems As per HPI     Objective:   Physical Exam BP 145/39  Pulse 78  Temp(Src) 98.1 F (36.7 C)  Resp 19 General appearance: alert, cooperative and no distress Lungs: clear to auscultation bilaterally Heart: regular rate and rhythm, S1, S2 normal, no murmur, click, rub or gallop Abdomen: soft, non-tender; bowel sounds normal; no masses,  no organomegaly Extremities: extremities normal, atraumatic, no cyanosis or edema Neurologic: awake, alert, answers some questions       Assessment & Plan:

## 2013-06-05 NOTE — Assessment & Plan Note (Signed)
A: BP at goal for age P: continue current regimen

## 2013-06-06 ENCOUNTER — Other Ambulatory Visit: Payer: Self-pay | Admitting: Sports Medicine

## 2013-06-06 MED ORDER — TRAMADOL HCL 50 MG PO TABS: 50.0000 mg | ORAL_TABLET | Freq: Three times a day (TID) | ORAL | Status: DC

## 2013-06-09 ENCOUNTER — Encounter: Payer: Self-pay | Admitting: Pharmacist

## 2013-06-23 ENCOUNTER — Non-Acute Institutional Stay (INDEPENDENT_AMBULATORY_CARE_PROVIDER_SITE_OTHER): Payer: Medicare Other | Admitting: Family Medicine

## 2013-06-23 DIAGNOSIS — N182 Chronic kidney disease, stage 2 (mild): Secondary | ICD-10-CM

## 2013-06-23 DIAGNOSIS — S7290XA Unspecified fracture of unspecified femur, initial encounter for closed fracture: Secondary | ICD-10-CM

## 2013-06-23 DIAGNOSIS — I350 Nonrheumatic aortic (valve) stenosis: Secondary | ICD-10-CM

## 2013-06-23 DIAGNOSIS — I82409 Acute embolism and thrombosis of unspecified deep veins of unspecified lower extremity: Secondary | ICD-10-CM

## 2013-06-23 DIAGNOSIS — I359 Nonrheumatic aortic valve disorder, unspecified: Secondary | ICD-10-CM

## 2013-06-23 DIAGNOSIS — S7291XA Unspecified fracture of right femur, initial encounter for closed fracture: Secondary | ICD-10-CM

## 2013-06-23 DIAGNOSIS — F0150 Vascular dementia without behavioral disturbance: Secondary | ICD-10-CM

## 2013-06-23 DIAGNOSIS — I10 Essential (primary) hypertension: Secondary | ICD-10-CM

## 2013-06-23 NOTE — Assessment & Plan Note (Signed)
Continue xarelto given superficial clot that developed quickly after xarelto D/c previously.

## 2013-06-23 NOTE — Assessment & Plan Note (Signed)
Healing well. Had ortho o/p follow up and fracture continues to heal. Only prn follow up with ortho now.

## 2013-06-23 NOTE — Assessment & Plan Note (Addendum)
Continue aggrenox, atorvastatin and BP control.

## 2013-06-23 NOTE — Assessment & Plan Note (Signed)
Well controlled. Renal protective effects likely minimal given life expectancy/hospice. Could consider removing lisinopril. Need to discuss with family.

## 2013-06-23 NOTE — Progress Notes (Signed)
Carla Reddish, MD Phone: [phone]  Subjective:  Nursing home visit. Room 226A Patient's care is being coordinated currently with Hospice.   Since I have seen patient last, she was found down on the ground on 2/24 and was sent to the ED. There was concern of a new fracture in prior healing callus in R femur and outpatient follow up was recommended. She had a negative head CT. Outpatient follow up showed no new fracture with orthopedics. Due to history of fracture, she is still on tramadol q8 hours.   She also has been seen by wound care for sacral ulcer which is improving. She had a negative wound culture. She had vit c, zinc, and prostat added. She has to be fed but is eating 75-100% of her meals.   CHF due to valvular disease-Aortic Stenosis vs. Regurgitation. CKD Echocardiogram performed on 09/08/2012 showed ejection fraction of 55-60%, LVH present, mild to moderate aortic regurgitation. Aortic sclerosis without stenosis. This varies significantly from 12/07/11 echocardiogram report which states severe stenosis and mild regurgitation. Echocardiogram during hospitalization showed once again "severe stenosis. Mild regurgitation."  Weight has been stable near 230 last time I saw patient, but recent weights show weights near 200 over last month.   Cr was 0.94 most recently corresponding to gfr of 67 (stage II CKD). For which she takes lisinopril.   Hypertension/history CVA/vascular dementia Now only on lisinopril and clonidine.  On Aggrenox for history CVA as well as statin.  ROS-unable to obtain but does not appear SOB or in respiratory distress or with chest discomfort.   DVT-tolerating xarelto. Patient did not have recurrence of DVT when off of xarelto but quickly formed a superficial clot and given quick occurrence of this, decision made to permanently continued unless under [place] this is changed.    ROS-cannot obtain due to dementia Past Medical History- Patient Active Problem List   Diagnosis Date Noted  . Aortic stenosis 12/11/2011    Priority: High  . Vascular dementia     Priority: High  . Closed fracture of right femur 01/24/2013    Priority: Medium  . DVT (deep venous thrombosis) 05/12/2012    Priority: Medium  . Hypertension 12/27/2011    Priority: Medium  . CKD (chronic kidney disease) stage 2, GFR 60-89 ml/min 12/11/2011    Priority: Medium  . Breast neoplasm     Priority: Medium  . Glaucoma     Priority: Medium  . Vitamin D deficiency     Priority: Medium  . Hyperlipidemia     Priority: Medium  . Anemia 05/25/2012    Priority: Low  . Mitral stenosis 02/15/2012    Priority: Low  . Sacral pressure sore 02/15/2012    Priority: Low  . Hiatal hernia     Priority: Low  . Allergic rhinitis     Priority: Low   Medications- reviewed and updated Current Outpatient Prescriptions  Medication Sig Dispense Refill  . acetaminophen (TYLENOL 8 HOUR) 650 MG CR tablet Take 1 tablet (650 mg total) by mouth every 8 (eight) hours as needed for pain.      Marland Kitchen acetaminophen (TYLENOL) 325 MG tablet Take 2 tablets (650 mg total) by mouth 2 (two) times daily as needed (pain, between scheduled doses).      Marland Kitchen atorvastatin (LIPITOR) 20 MG tablet Take 20 mg by mouth daily.      . calcium citrate (CALCITRATE - DOSED IN MG ELEMENTAL CALCIUM) 950 MG tablet Take 400 mg of elemental calcium by mouth daily.       Marland Kitchen  cloNIDine (CATAPRES - DOSED IN MG/24 HR) 0.1 mg/24hr patch Place 0.1 mg onto the skin once a week. On Friday      . collagenase (SANTYL) ointment Apply 1 application topically daily as needed (to bottom with each diaper change).      Marland Kitchen dipyridamole-aspirin (AGGRENOX) 200-25 MG per 12 hr capsule Take 1 capsule by mouth 2 (two) times daily.      . fluticasone (FLONASE) 50 MCG/ACT nasal spray Place 1 spray into both nostrils daily.      Marland Kitchen lisinopril (PRINIVIL,ZESTRIL) 2.5 MG tablet Take 2.5 mg by mouth daily.      . magnesium hydroxide (MILK OF MAGNESIA) 400 MG/5ML  suspension Take 30 mLs by mouth daily as needed for mild constipation.      . polyethylene glycol powder (GLYCOLAX/MIRALAX) powder Take 17 g by mouth daily as needed for mild constipation. FOR CONSTIPATION      . promethazine (PHENERGAN) 25 MG tablet Take 25 mg by mouth every 6 (six) hours as needed for nausea.      . ranitidine (ZANTAC) 150 MG tablet Take 150 mg by mouth every evening.      . Rivaroxaban (XARELTO) 20 MG TABS tablet Take 20 mg by mouth daily.      . traMADol (ULTRAM) 50 MG tablet Take 1 tablet (50 mg total) by mouth 3 (three) times daily.  90 tablet  0  . Travoprost, BAK Free, (TRAVATAN) 0.004 % SOLN ophthalmic solution Place 1 drop into both eyes at bedtime.      . vitamin C (ASCORBIC ACID) 250 MG tablet Take 500 mg by mouth 2 (two) times daily. For wound healing      . Vitamin D, Ergocalciferol, (DRISDOL) 50000 UNITS CAPS capsule Take 1 capsule (50,000 Units total) by mouth every 30 (thirty) days. Give q 15th day of each month  30 capsule  0  . zinc sulfate 220 MG capsule Take 220 mg by mouth daily.       No current facility-administered medications for this visit.    Objective: BP 108/70  Pulse 88  Temp(Src) 98.6 F (37 C)  Resp 20  Wt 197 lb 12.8 oz (89.721 kg) Gen: NAD, tracks with eyes. Says hello and answers first 2 questions then no longer speaks to me.  HEENT: Mucous membranes are moist. CV: RRR, unchanged 4/6 SEM at LUSB, s2 still audible Abdomen: soft/nontender/nondistended Lungs: CTAB anteriorly, no wheezes rales or rhonchi. Patient will not participate with sitting up or rolling over. Non labored breathing.  Ext: 1+ in left, 1+ in right Neuro: does not follow commands with exception of sticking her tongue out.   Assessment/Plan:  Goals of Care Remains on hospice. May need to discuss removing some of current medications in future as indications are limited if life expectancy <6 months (such as lisinopril and CKD). I tried to reach out to family x 3 with  inability to get in touch with (wasn't available x 1, and unable to leave voicemail x 2)   CKD (chronic kidney disease) stage 2, GFR 60-89 ml/min Well controlled. Renal protective effects likely minimal given life expectancy/hospice. Could consider removing lisinopril. Need to discuss with family.     Aortic stenosis Weight appears to have dropped 30 lbs since I saw patient last. I wonder if this was related to equipment in bed with patient after fracture which she no longer has or transfer of room/bed. She does not appear fluid overloaded and murmur is stable. Monitor for now.  Vascular dementia Continue aggrenox, atorvastatin and BP control.   Hypertension Well controlled, continue lisinopril and clonidine (consider d/c lisinopril).  DVT (deep venous thrombosis) Continue xarelto given superficial clot that developed quickly after xarelto D/c previously.   Closed fracture of right femur Healing well. Had ortho o/p follow up and fracture continues to heal. Only prn follow up with ortho now.

## 2013-06-23 NOTE — Assessment & Plan Note (Signed)
Well controlled, continue lisinopril and clonidine (consider d/c lisinopril).

## 2013-06-23 NOTE — Assessment & Plan Note (Signed)
Weight appears to have dropped 30 lbs since I saw patient last. I wonder if this was related to equipment in bed with patient after fracture which she no longer has or transfer of room/bed. She does not appear fluid overloaded and murmur is stable. Monitor for now.

## 2013-07-05 ENCOUNTER — Telehealth: Payer: Self-pay | Admitting: Family Medicine

## 2013-07-05 MED ORDER — CLONIDINE HCL 0.1 MG/24HR TD PTWK: 0.1000 mg | MEDICATED_PATCH | TRANSDERMAL | Status: DC

## 2013-07-05 NOTE — Telephone Encounter (Signed)
Emergency Line Phone Call  Nursing at [facility] calls reporting that the patient is on a clonidine patch 0.1 mg to be placed weekly on fridays. The patch was applied on 4/17, though when nursing was evaluating the patient they could not find the patch today and suspect it came off during the bathing process earlier today. They are requesting a verbal order for a new patch to be placed on today. Verbal order was given for this to be replaced. This new patch should be replaced in 7 days.   Tommi Rumps, MD

## 2013-07-07 ENCOUNTER — Telehealth: Payer: Self-pay | Admitting: Family Medicine

## 2013-07-07 NOTE — Telephone Encounter (Signed)
Spoke with sister [name] for update on [name]. Described our last visit and fact I was pleased with no recent events requiring hospitalization.   Sister reports she just lost her other sister and has been unable to get in recently. I offered my condolences. She is going to try to stop by over the weekend and asks that I let [name] know today that she will be in which I have agreed to do.   I will check in with [name] in another 2 weeks or so. At some point when a less difficult time for [name], would like to see where she is at regarding goals of care.

## 2013-07-14 ENCOUNTER — Other Ambulatory Visit: Payer: Self-pay | Admitting: Family Medicine

## 2013-07-14 MED ORDER — TRAMADOL HCL 50 MG PO TABS: 50.0000 mg | ORAL_TABLET | Freq: Three times a day (TID) | ORAL | Status: DC

## 2013-07-14 NOTE — Telephone Encounter (Signed)
Printed refill for Tramadol. Faxed to [facility] on 07/14/13.

## 2013-07-27 ENCOUNTER — Telehealth: Payer: Self-pay | Admitting: Family Medicine

## 2013-07-27 NOTE — Telephone Encounter (Signed)
Discussed with sister, [name], about atorvastatin being discontinued given more for long term benefit and patient on hospice. Sister initially reluctant to change but ultimately agrees if it simplifies her regimen and will not likely cause acute change, she is ok with this. She is still on aggrenox for protection from CVA. Also, ok with me discontinuing lisinopril if SBP <150.

## 2013-08-02 ENCOUNTER — Non-Acute Institutional Stay: Admitting: Family Medicine

## 2013-08-02 ENCOUNTER — Encounter: Payer: Self-pay | Admitting: Family Medicine

## 2013-08-02 DIAGNOSIS — I10 Essential (primary) hypertension: Secondary | ICD-10-CM

## 2013-08-02 DIAGNOSIS — I359 Nonrheumatic aortic valve disorder, unspecified: Secondary | ICD-10-CM

## 2013-08-02 DIAGNOSIS — S7291XA Unspecified fracture of right femur, initial encounter for closed fracture: Secondary | ICD-10-CM

## 2013-08-02 DIAGNOSIS — J309 Allergic rhinitis, unspecified: Secondary | ICD-10-CM

## 2013-08-02 DIAGNOSIS — I350 Nonrheumatic aortic (valve) stenosis: Secondary | ICD-10-CM

## 2013-08-02 DIAGNOSIS — S7290XA Unspecified fracture of unspecified femur, initial encounter for closed fracture: Secondary | ICD-10-CM

## 2013-08-02 NOTE — Assessment & Plan Note (Signed)
Slightly above goal on < 150/90 on latest check. Plan to monitor and increase clonidine patch dose prn.

## 2013-08-02 NOTE — Assessment & Plan Note (Signed)
Stable murmur slightly hypertensive with wide pulse pressure.

## 2013-08-02 NOTE — Assessment & Plan Note (Signed)
Healing well. No longer in soft brace, patient with very minimal movement. Plan to continue pain control with tylenol and prn ortho f/u

## 2013-08-02 NOTE — Assessment & Plan Note (Addendum)
Continues on daily  flonase no signs or symptoms to suggest need for additional therapy.  Would likely tolerate a discontinuation of flonase at the end of the summer.

## 2013-08-02 NOTE — Progress Notes (Signed)
  Geriatric Attending Visit  Subjective:    Patient ID: [name], female    DOB: October 07, 1934, 79 y.o.   MRN: [id number] CC: none  HPI Patient seen lying in bed. She has no complaints. No acute events. Denies pain.   Review of Systems As per HPI     Objective:   Physical Exam BP 155/64  Pulse 84  Temp(Src) 98.2 F (36.8 C)  Resp 18  Wt 201 lb 9.6 oz (91.445 kg) General appearance: alert, cooperative and no distress Lungs: clear to auscultation bilaterally Heart: regular rate and rhythm and systolic murmur: early systolic 3/6, decrescendo at 2nd left intercostal space Abdomen: soft, non-tender; bowel sounds normal; no masses,  no organomegaly Pelvic: foley in place  Extremities: extremities normal, atraumatic, no cyanosis or edema Skin: single lesion on plantar surface of L foot. No heel decubiti. Heel floaters in place. Sacrum not examined.       Assessment & Plan:

## 2013-08-04 ENCOUNTER — Encounter: Payer: Self-pay | Admitting: Pharmacist

## 2013-08-10 ENCOUNTER — Other Ambulatory Visit: Payer: Self-pay | Admitting: Family Medicine

## 2013-08-10 MED ORDER — TRAMADOL HCL 50 MG PO TABS: 50.0000 mg | ORAL_TABLET | Freq: Three times a day (TID) | ORAL | Status: DC

## 2013-08-21 ENCOUNTER — Other Ambulatory Visit: Payer: Self-pay | Admitting: Family Medicine

## 2013-08-22 ENCOUNTER — Encounter: Payer: Self-pay | Admitting: Family Medicine

## 2013-08-22 ENCOUNTER — Non-Acute Institutional Stay (INDEPENDENT_AMBULATORY_CARE_PROVIDER_SITE_OTHER): Payer: Medicare Other | Admitting: Family Medicine

## 2013-08-22 DIAGNOSIS — I10 Essential (primary) hypertension: Secondary | ICD-10-CM

## 2013-08-22 DIAGNOSIS — L89159 Pressure ulcer of sacral region, unspecified stage: Secondary | ICD-10-CM

## 2013-08-22 DIAGNOSIS — F0150 Vascular dementia without behavioral disturbance: Secondary | ICD-10-CM

## 2013-08-22 DIAGNOSIS — L8990 Pressure ulcer of unspecified site, unspecified stage: Secondary | ICD-10-CM

## 2013-08-22 DIAGNOSIS — I82409 Acute embolism and thrombosis of unspecified deep veins of unspecified lower extremity: Secondary | ICD-10-CM

## 2013-08-22 DIAGNOSIS — I359 Nonrheumatic aortic valve disorder, unspecified: Secondary | ICD-10-CM

## 2013-08-22 DIAGNOSIS — L89109 Pressure ulcer of unspecified part of back, unspecified stage: Secondary | ICD-10-CM

## 2013-08-22 DIAGNOSIS — E785 Hyperlipidemia, unspecified: Secondary | ICD-10-CM

## 2013-08-22 DIAGNOSIS — N182 Chronic kidney disease, stage 2 (mild): Secondary | ICD-10-CM

## 2013-08-22 DIAGNOSIS — I350 Nonrheumatic aortic (valve) stenosis: Secondary | ICD-10-CM

## 2013-08-22 NOTE — Assessment & Plan Note (Signed)
Continue xarelto for now, consider discussion in future about stopping.

## 2013-08-22 NOTE — Assessment & Plan Note (Signed)
Continue aggrenox and BP control. Now off statin due to [place] discussion.

## 2013-08-22 NOTE — Progress Notes (Signed)
Garret Reddish, MD Phone: [phone]  Subjective:  Nursing home visit. Room 226A Patient's care is being coordinated currently with Hospice.   Since I have seen patient last, she has continued wound care for sacral ulcer. She had a negative wound culture 2 months ago. She had vit c, zinc, and prostat added. She has to be fed but is eating 75-100% of her meals.   CHF due to valvular disease-Aortic Stenosis vs. Regurgitation. CKD New dry weight appears to be around 200. GIven only CKD stage II and on hospice, we recently discontinued lisinopril as likely offers little long term benefit. Lisinopril will not benefit CHF given due to valvular disease. Edema in legs has resolved.   Hypertension/history CVA/vascular dementia Now only clonidine and BP well controlled most recently.  On Aggrenox for history CVA. Off statin given long discussion with HCPOA last month.  ROS-unable to obtain but does not appear SOB or in respiratory distress or with chest discomfort. No decreased funtioning noted now that she is off of namenda/aricept.   DVT-tolerating xarelto. Patient did not have recurrence of DVT when off of xarelto but quickly formed a superficial clot and given quick occurrence of this, decision made to permanently continued unless under [place] this is changed.    ROS-cannot obtain due to dementia, level 5 caveat   Past Medical History- Patient Active Problem List   Diagnosis Date Noted  . Aortic stenosis 12/11/2011    Priority: High  . Vascular dementia     Priority: High  . Closed fracture of right femur 01/24/2013    Priority: Medium  . DVT (deep venous thrombosis) 05/12/2012    Priority: Medium  . Hypertension 12/27/2011    Priority: Medium  . Breast neoplasm     Priority: Medium  . Glaucoma     Priority: Medium  . Vitamin D deficiency     Priority: Medium  . Hyperlipidemia     Priority: Medium  . Anemia 05/25/2012    Priority: Low  . Mitral stenosis 02/15/2012    Priority:  Low  . Sacral pressure sore 02/15/2012    Priority: Low  . CKD (chronic kidney disease) stage 2, GFR 60-89 ml/min 12/11/2011    Priority: Low  . Hiatal hernia     Priority: Low  . Allergic rhinitis     Priority: Low   Medications- reviewed and updated Current Outpatient Prescriptions  Medication Sig Dispense Refill  . acetaminophen (TYLENOL) 325 MG tablet Take 2 tablets (650 mg total) by mouth 2 (two) times daily as needed (pain, between scheduled doses).      Marland Kitchen acetaminophen (TYLENOL) 650 MG CR tablet Take 650 mg by mouth every 8 (eight) hours.      . calcium citrate (CALCITRATE - DOSED IN MG ELEMENTAL CALCIUM) 950 MG tablet Take 400 mg of elemental calcium by mouth daily.       . cloNIDine (CATAPRES - DOSED IN MG/24 HR) 0.1 mg/24hr patch Place 1 patch (0.1 mg total) onto the skin once a week. On Wednesday.  4 patch  0  . collagenase (SANTYL) ointment Apply 1 application topically daily as needed (to bottom with each diaper change).      Marland Kitchen dipyridamole-aspirin (AGGRENOX) 200-25 MG per 12 hr capsule Take 1 capsule by mouth 2 (two) times daily.      . fluticasone (FLONASE) 50 MCG/ACT nasal spray Place 1 spray into both nostrils daily.      . polyethylene glycol powder (GLYCOLAX/MIRALAX) powder Take 17 g by mouth daily  as needed for mild constipation. FOR CONSTIPATION      . promethazine (PHENERGAN) 25 MG tablet Take 25 mg by mouth every 6 (six) hours as needed for nausea.      . ranitidine (ZANTAC) 150 MG tablet Take 150 mg by mouth every evening.      . Rivaroxaban (XARELTO) 20 MG TABS tablet Take 20 mg by mouth daily.      . traMADol (ULTRAM) 50 MG tablet Take 1 tablet (50 mg total) by mouth 3 (three) times daily.  90 tablet  0  . Travoprost, BAK Free, (TRAVATAN) 0.004 % SOLN ophthalmic solution Place 1 drop into both eyes at bedtime.      . vitamin C (ASCORBIC ACID) 250 MG tablet Take 500 mg by mouth 2 (two) times daily. For wound healing      . Vitamin D, Ergocalciferol, (DRISDOL) 50000  UNITS CAPS capsule Take 1 capsule (50,000 Units total) by mouth every 30 (thirty) days. Give q 15th day of each month  30 capsule  0   No current facility-administered medications for this visit.    Objective: BP 124/82  Pulse 68  Temp(Src) 98.3 F (36.8 C)  Resp 18  Wt 199 lb 12.8 oz (90.629 kg) Gen: NAD, tracks with eyes. Eating well when I came in room. Does nto answer any questions today.  HEENT: Mucous membranes are moist. CV: RRR, unchanged 4/6 SEM at LUSB, s2 still audible Abdomen: soft/nontender/nondistended Lungs: CTAB anteriorly, no wheezes rales or rhonchi. Patient will not participate with sitting up or rolling over. Non labored breathing.  Ext: no edema Neuro: does not follow commands with exception of sticking her tongue out.   Assessment/Plan:  Goals of Care Remains on hospice. Over last year we have been able to remove namenda, aricept, lisinopril, and statin without any change in her status noted. Could consider in future discussions about aggrenox and xarelto given limited life expectancy. I think sister Stanton Kidney who has HCPOA is truly starting to come to terms with sisters limited life expectancy.    Vascular dementia Continue aggrenox and BP control. Now off statin due to [place] discussion.   Aortic stenosis Stable. CHF related to this has seemed to improve and patient not on oxygen and has no edema in legs.   Hypertension Well controlled on  Clonidine alone. Continue.   Hyperlipidemia Stopped statin due to [place] discussion.   DVT (deep venous thrombosis) Continue xarelto for now, consider discussion in future about stopping.   CKD (chronic kidney disease) stage 2, GFR 60-89 ml/min Stopped lisinopril due to [place] discussion. No need for regular monitoring as on hospice.   Sacral pressure sore Bedbound. Continue wound care.

## 2013-08-22 NOTE — Assessment & Plan Note (Signed)
Stopped statin due to [place] discussion.

## 2013-08-22 NOTE — Assessment & Plan Note (Signed)
Stopped lisinopril due to [place] discussion. No need for regular monitoring as on hospice.

## 2013-08-22 NOTE — Assessment & Plan Note (Signed)
Bedbound. Continue wound care.

## 2013-08-22 NOTE — Assessment & Plan Note (Signed)
Stable. CHF related to this has seemed to improve and patient not on oxygen and has no edema in legs.

## 2013-08-22 NOTE — Assessment & Plan Note (Signed)
Well controlled on  Clonidine alone. Continue.

## 2013-08-24 ENCOUNTER — Encounter: Payer: Self-pay | Admitting: Pharmacist

## 2013-09-11 NOTE — Progress Notes (Signed)
Attending Addendum  I examined the patient and discussed the discharge plan with Dr. Grunz. I have reviewed the note and agree.    Ludene Stokke, MD FAMILY MEDICINE TEACHING SERVICE   

## 2013-09-13 ENCOUNTER — Other Ambulatory Visit: Payer: Self-pay | Admitting: *Deleted

## 2013-09-18 ENCOUNTER — Other Ambulatory Visit: Payer: Self-pay | Admitting: Family Medicine

## 2013-09-18 MED ORDER — TRAMADOL HCL 50 MG PO TABS: 50.0000 mg | ORAL_TABLET | Freq: Three times a day (TID) | ORAL | Status: DC

## 2013-10-27 ENCOUNTER — Non-Acute Institutional Stay: Payer: Medicare Other | Admitting: Family Medicine

## 2013-10-27 ENCOUNTER — Encounter: Payer: Self-pay | Admitting: Family Medicine

## 2013-10-27 ENCOUNTER — Emergency Department (HOSPITAL_COMMUNITY)
Admission: EM | Admit: 2013-10-27 | Discharge: 2013-10-27 | Disposition: A | Discharge status: Home / Self Care | Payer: Medicare Other | Attending: Emergency Medicine | Admitting: Emergency Medicine

## 2013-10-27 DIAGNOSIS — Z8619 Personal history of other infectious and parasitic diseases: Secondary | ICD-10-CM | POA: Insufficient documentation

## 2013-10-27 DIAGNOSIS — Z862 Personal history of diseases of the blood and blood-forming organs and certain disorders involving the immune mechanism: Secondary | ICD-10-CM | POA: Diagnosis not present

## 2013-10-27 DIAGNOSIS — F329 Major depressive disorder, single episode, unspecified: Secondary | ICD-10-CM | POA: Insufficient documentation

## 2013-10-27 DIAGNOSIS — K625 Hemorrhage of anus and rectum: Secondary | ICD-10-CM | POA: Diagnosis present

## 2013-10-27 DIAGNOSIS — Z8709 Personal history of other diseases of the respiratory system: Secondary | ICD-10-CM | POA: Insufficient documentation

## 2013-10-27 DIAGNOSIS — Z87440 Personal history of urinary (tract) infections: Secondary | ICD-10-CM | POA: Diagnosis not present

## 2013-10-27 DIAGNOSIS — L89309 Pressure ulcer of unspecified buttock, unspecified stage: Secondary | ICD-10-CM | POA: Insufficient documentation

## 2013-10-27 DIAGNOSIS — Z880 Allergy status to penicillin: Secondary | ICD-10-CM | POA: Diagnosis not present

## 2013-10-27 DIAGNOSIS — I10 Essential (primary) hypertension: Secondary | ICD-10-CM | POA: Diagnosis not present

## 2013-10-27 DIAGNOSIS — I82409 Acute embolism and thrombosis of unspecified deep veins of unspecified lower extremity: Secondary | ICD-10-CM

## 2013-10-27 DIAGNOSIS — I359 Nonrheumatic aortic valve disorder, unspecified: Secondary | ICD-10-CM

## 2013-10-27 DIAGNOSIS — E669 Obesity, unspecified: Secondary | ICD-10-CM | POA: Insufficient documentation

## 2013-10-27 DIAGNOSIS — F0280 Dementia in other diseases classified elsewhere without behavioral disturbance: Secondary | ICD-10-CM | POA: Diagnosis not present

## 2013-10-27 DIAGNOSIS — J45901 Unspecified asthma with (acute) exacerbation: Secondary | ICD-10-CM | POA: Insufficient documentation

## 2013-10-27 DIAGNOSIS — I82402 Acute embolism and thrombosis of unspecified deep veins of left lower extremity: Secondary | ICD-10-CM

## 2013-10-27 DIAGNOSIS — Z8673 Personal history of transient ischemic attack (TIA), and cerebral infarction without residual deficits: Secondary | ICD-10-CM | POA: Insufficient documentation

## 2013-10-27 DIAGNOSIS — E119 Type 2 diabetes mellitus without complications: Secondary | ICD-10-CM | POA: Diagnosis not present

## 2013-10-27 DIAGNOSIS — Z853 Personal history of malignant neoplasm of breast: Secondary | ICD-10-CM | POA: Diagnosis not present

## 2013-10-27 DIAGNOSIS — Z8719 Personal history of other diseases of the digestive system: Secondary | ICD-10-CM | POA: Diagnosis not present

## 2013-10-27 DIAGNOSIS — G309 Alzheimer's disease, unspecified: Secondary | ICD-10-CM | POA: Insufficient documentation

## 2013-10-27 DIAGNOSIS — IMO0002 Reserved for concepts with insufficient information to code with codable children: Secondary | ICD-10-CM | POA: Insufficient documentation

## 2013-10-27 DIAGNOSIS — Z79899 Other long term (current) drug therapy: Secondary | ICD-10-CM | POA: Diagnosis not present

## 2013-10-27 DIAGNOSIS — T148XXA Other injury of unspecified body region, initial encounter: Secondary | ICD-10-CM | POA: Insufficient documentation

## 2013-10-27 DIAGNOSIS — Z86718 Personal history of other venous thrombosis and embolism: Secondary | ICD-10-CM | POA: Insufficient documentation

## 2013-10-27 DIAGNOSIS — F0150 Vascular dementia without behavioral disturbance: Secondary | ICD-10-CM

## 2013-10-27 DIAGNOSIS — F3289 Other specified depressive episodes: Secondary | ICD-10-CM | POA: Insufficient documentation

## 2013-10-27 DIAGNOSIS — T792XXA Traumatic secondary and recurrent hemorrhage and seroma, initial encounter: Secondary | ICD-10-CM

## 2013-10-27 DIAGNOSIS — Z8669 Personal history of other diseases of the nervous system and sense organs: Secondary | ICD-10-CM | POA: Diagnosis not present

## 2013-10-27 DIAGNOSIS — L89154 Pressure ulcer of sacral region, stage 4: Secondary | ICD-10-CM

## 2013-10-27 DIAGNOSIS — E559 Vitamin D deficiency, unspecified: Secondary | ICD-10-CM | POA: Insufficient documentation

## 2013-10-27 DIAGNOSIS — L89303 Pressure ulcer of unspecified buttock, stage 3: Secondary | ICD-10-CM

## 2013-10-27 DIAGNOSIS — I350 Nonrheumatic aortic (valve) stenosis: Secondary | ICD-10-CM

## 2013-10-27 DIAGNOSIS — L89109 Pressure ulcer of unspecified part of back, unspecified stage: Secondary | ICD-10-CM

## 2013-10-27 DIAGNOSIS — Z8701 Personal history of pneumonia (recurrent): Secondary | ICD-10-CM | POA: Insufficient documentation

## 2013-10-27 DIAGNOSIS — Z515 Encounter for palliative care: Secondary | ICD-10-CM

## 2013-10-27 DIAGNOSIS — H409 Unspecified glaucoma: Secondary | ICD-10-CM

## 2013-10-27 DIAGNOSIS — L8994 Pressure ulcer of unspecified site, stage 4: Secondary | ICD-10-CM

## 2013-10-27 HISTORY — DX: Personal history of transient ischemic attack (TIA), and cerebral infarction without residual deficits: Z86.73

## 2013-10-27 LAB — CBC WITH DIFFERENTIAL/PLATELET
BASOS PCT: 0 % (ref 0–1)
Basophils Absolute: 0 10*3/uL (ref 0.0–0.1)
Basophils Absolute: 0 10*3/uL (ref 0.0–0.1)
Basophils Relative: 0 % (ref 0–1)
EOS ABS: 0.2 10*3/uL (ref 0.0–0.7)
EOS PCT: 3 % (ref 0–5)
Eosinophils Absolute: 0.2 10*3/uL (ref 0.0–0.7)
Eosinophils Relative: 3 % (ref 0–5)
HCT: 33 % — ABNORMAL LOW (ref 36.0–46.0)
HCT: 32.9 % — ABNORMAL LOW (ref 36.0–46.0)
HEMOGLOBIN: 10.3 g/dL — AB (ref 12.0–15.0)
HEMOGLOBIN: 10.5 g/dL — AB (ref 12.0–15.0)
LYMPHS ABS: 3.6 10*3/uL (ref 0.7–4.0)
LYMPHS PCT: 35 % (ref 12–46)
Lymphocytes Relative: 46 % (ref 12–46)
Lymphs Abs: 2.6 10*3/uL (ref 0.7–4.0)
MCH: 25.4 pg — ABNORMAL LOW (ref 26.0–34.0)
MCH: 26.6 pg (ref 26.0–34.0)
MCHC: 31.2 g/dL (ref 30.0–36.0)
MCHC: 31.9 g/dL (ref 30.0–36.0)
MCV: 81.3 fL (ref 78.0–100.0)
MCV: 83.3 fL (ref 78.0–100.0)
MONO ABS: 0.7 10*3/uL (ref 0.1–1.0)
MONOS PCT: 11 % (ref 3–12)
MONOS PCT: 9 % (ref 3–12)
Monocytes Absolute: 0.9 10*3/uL (ref 0.1–1.0)
NEUTROS ABS: 3.9 10*3/uL (ref 1.7–7.7)
Neutro Abs: 3.3 10*3/uL (ref 1.7–7.7)
Neutrophils Relative %: 42 % — ABNORMAL LOW (ref 43–77)
Neutrophils Relative %: 51 % (ref 43–77)
Platelets: 286 10*3/uL (ref 150–400)
Platelets: 234 10*3/uL (ref 150–400)
RBC: 3.95 MIL/uL (ref 3.87–5.11)
RBC: 4.06 MIL/uL (ref 3.87–5.11)
RDW: 16.9 % — ABNORMAL HIGH (ref 11.5–15.5)
RDW: 16.9 % — ABNORMAL HIGH (ref 11.5–15.5)
WBC: 7.8 10*3/uL (ref 4.0–10.5)
WBC: 7.6 10*3/uL (ref 4.0–10.5)

## 2013-10-27 LAB — POC OCCULT BLOOD, ED: Fecal Occult Bld: POSITIVE — AB

## 2013-10-27 LAB — BASIC METABOLIC PANEL
Anion gap: 13 (ref 5–15)
BUN: 37 mg/dL — AB (ref 6–23)
CO2: 24 mEq/L (ref 19–32)
CREATININE: 0.88 mg/dL (ref 0.50–1.10)
Calcium: 8.9 mg/dL (ref 8.4–10.5)
Chloride: 105 mEq/L (ref 96–112)
GFR, EST AFRICAN AMERICAN: 72 mL/min — AB (ref >90)
GFR, EST NON AFRICAN AMERICAN: 62 mL/min — AB (ref >90)
GLUCOSE: 111 mg/dL — AB (ref 70–99)
Potassium: 5.3 mEq/L (ref 3.7–5.3)
Sodium: 142 mEq/L (ref 137–147)

## 2013-10-27 LAB — TYPE AND SCREEN
ABO/RH(D): B POS
Antibody Screen: NEGATIVE

## 2013-10-27 LAB — PROTIME-INR
INR: 2.01 — ABNORMAL HIGH (ref 0.00–1.49)
Prothrombin Time: 22.8 seconds — ABNORMAL HIGH (ref 11.6–15.2)

## 2013-10-27 LAB — APTT: aPTT: 59 seconds — ABNORMAL HIGH (ref 24–37)

## 2013-10-27 NOTE — Assessment & Plan Note (Signed)
Suspect Terminal pressure ulcer that with continuation of current good Wound care will not cause significant discomfort though significant healing may not occur even with optimal treatment.

## 2013-10-27 NOTE — Discharge Instructions (Signed)
Pressure Ulcer [name], you were seen today for rectal bleeding.  The bleeding likely came from the wound on your buttock.  Your blood counts were stable after 3 hours.  You have increased bleeding because you are on xarelto.  Follow up with your doctor within 2 days to have your wound examined again.  If symptoms worsen, return to the ED immediately for repeat evaluation.  Thank you. A pressure ulcer is a sore that has formed from the breakdown of skin and exposure of deeper layers of tissue. It develops in areas of the body where there is unrelieved pressure. Pressure ulcers are usually found over a bony area, such as the shoulder blades, spine, lower back, hips, knees, ankles, and heels. Pressure ulcers vary in severity. Your health care provider may determine the severity (stage) of your pressure ulcer. The stages include:  Stage I--The skin is red, and when the skin is pressed, it stays red.  Stage II--The top layer of skin is gone, and there is a shallow, pink ulcer.  Stage III--The ulcer becomes deeper, and it is more difficult to see the whole wound. Also, there may be yellow or brown parts, as well as pink and red parts.  Stage IV--The ulcer may be deep and red, pink, brown, white, or yellow. Bone or muscle may be seen.  Unstageable pressure ulcer--The ulcer is covered almost completely with Monreal, brown, or yellow tissue. It is not known how deep the ulcer is or what stage it is until this covering comes off.  Suspected deep tissue injury--A person's skin can be injured from pressure or pulling on the skin when his or her position is changed. The skin appears purple or maroon. There may not be an opening in the skin, but there could be a blood-filled blister. This deep tissue injury is often difficult to see in people with darker skin tones. The site may open and become deeper in time. However, early interventions will help the area heal and may prevent the area from opening. CAUSES    Pressure ulcers are caused by pressure against the skin that limits the flow of blood to the skin and nearby tissues. There are many risk factors that can lead to pressure sores. RISK FACTORS  Decreased ability to move.  Decreased ability to feel pain or discomfort.  Excessive skin moisture from urine, stool, sweat, or secretions.  Poor nutrition.  Dehydration.  Tobacco, drug, or alcohol abuse.  Having someone pull on bedsheets that are under you, such as when health care workers are changing your position in a hospital bed.  Obesity.  Increased adult age.  Hospitalization in a critical care unit for longer than 4 days with use of medical devices.  Prolonged use of medical devices.  Critical illness.  Anemia.  Traumatic brain injury.  Spinal cord injury.  Stroke.  Diabetes.  Poor blood glucose control.  Low blood pressure (hypotension).  Low oxygen levels.  Medicines that reduce blood flow.  Infection. DIAGNOSIS  Your health care provider will diagnose your pressure ulcer based on its appearance. The health care provider may determine the stage of your pressure ulcer as well. Tests may be done to check for infection, to assess your circulation, or to check for other diseases, such as diabetes. TREATMENT  Treatment of your pressure ulcer begins with determining what stage the ulcer is in. Your treatment team may include your health care provider, a wound care specialist, a nutritionist, a physical therapist, and a Psychologist, sport and exercise. Possible  treatments may include:   Moving or repositioning every 1-2 hours.  Using beds or mattresses to shift your body weight and pressure points frequently.  Improving your diet.  Cleaning and bandaging (dressing) the open wound.  Giving antibiotic medicines.  Removing damaged tissue.  Surgery and sometimes skin grafts. HOME CARE INSTRUCTIONS  If you were hospitalized, follow the care plan that was started in the  hospital.  Avoid staying in the same position for more than 2 hours. Use padding, devices, or mattresses to cushion your pressure points as directed by your health care provider.  Eat a well-balanced diet. Take nutritional supplements and vitamins as directed by your health care provider.  Keep all follow-up appointments.  Only take over-the-counter or prescription medicines for pain, fever, or discomfort as directed by your health care provider. SEEK MEDICAL CARE IF:   Your pressure ulcer is not improving.  You do not know how to care for your pressure ulcer.  You notice other areas of redness on your skin.  You have a fever. SEEK IMMEDIATE MEDICAL CARE IF:   You have increasing redness, swelling, or pain in your pressure ulcer.  You notice pus coming from your pressure ulcer.  You notice a bad smell coming from the wound or dressing.  Your pressure ulcer opens up again. Document Released: 03/02/2005 Document Revised: 03/07/2013 Document Reviewed: 11/07/2012 [facility] Patient Information 2015 [place], [place]. This information is not intended to replace advice given to you by your health care provider. Make sure you discuss any questions you have with your health care provider.   Rivaroxaban oral tablets What is this medicine? RIVAROXABAN (ri va ROX a ban) is an anticoagulant (blood thinner). It is used to treat blood clots in the lungs or in the veins. It is also used after knee or hip surgeries to prevent blood clots. It is also used to lower the chance of stroke in people with a medical condition called atrial fibrillation. This medicine may be used for other purposes; ask your health care provider or pharmacist if you have questions. COMMON BRAND NAME(S): Xarelto, Xarelto Starter Pack What should I tell my health care provider before I take this medicine? They need to know if you have any of these conditions: -bleeding disorders -bleeding in the brain -blood in your stools  (Ficek or tarry stools) or if you have blood in your vomit -history of stomach bleeding -kidney disease -liver disease -low blood counts, like low white cell, platelet, or red cell counts -recent or planned spinal or epidural procedure -take medicines that treat or prevent blood clots -an unusual or allergic reaction to rivaroxaban, other medicines, foods, dyes, or preservatives -pregnant or trying to get pregnant -breast-feeding How should I use this medicine? Take this medicine by mouth with a glass of water. Follow the directions on the prescription label. Take your medicine at regular intervals. Do not take it more often than directed. Do not stop taking except on your doctor's advice. Stopping this medicine may increase your risk of a blot clot. Be sure to refill your prescription before you run out of medicine. If you are taking this medicine after hip or knee replacement surgery, take it with or without food. If you are taking this medicine for atrial fibrillation, take it with your evening meal. If you are taking this medicine to treat blood clots, take it with food at the same time each day. If you are unable to swallow your tablet, you may crush the tablet and mix  it in applesauce. Then, immediately eat the applesauce. You should eat more food right after you eat the applesauce containing the crushed tablet. Talk to your pediatrician regarding the use of this medicine in children. Special care may be needed. Overdosage: If you think you have taken too much of this medicine contact a poison control center or emergency room at once. NOTE: This medicine is only for you. Do not share this medicine with others. What if I miss a dose? If you take your medicine once a day and miss a dose, take the missed dose as soon as you remember. If you take your medicine twice a day and miss a dose, take the missed dose immediately. In this instance, 2 tablets may be taken at the same time. The next day you  should take 1 tablet twice a day as directed. What may interact with this medicine? -aspirin and aspirin-like medicines -certain antibiotics like erythromycin, azithromycin, and clarithromycin -certain medicines for fungal infections like ketoconazole and itraconazole -certain medicines for irregular heart beat like amiodarone, quinidine, dronedarone -certain medicines for seizures like carbamazepine, phenytoin -certain medicines that treat or prevent blood clots like warfarin, enoxaparin, and dalteparin -conivaptan -diltiazem -felodipine -indinavir -lopinavir; ritonavir -NSAIDS, medicines for pain and inflammation, like ibuprofen or naproxen -ranolazine -rifampin -ritonavir -St. John's wort -verapamil This list may not describe all possible interactions. Give your health care provider a list of all the medicines, herbs, non-prescription drugs, or dietary supplements you use. Also tell them if you smoke, drink alcohol, or use illegal drugs. Some items may interact with your medicine. What should I watch for while using this medicine? Visit your doctor or health care professional for regular checks on your progress. Your condition will be monitored carefully while you are receiving this medicine. Notify your doctor or health care professional and seek emergency treatment if you develop breathing problems; changes in vision; chest pain; severe, sudden headache; pain, swelling, warmth in the leg; trouble speaking; sudden numbness or weakness of the face, arm, or leg. These can be signs that your condition has gotten worse. If you are going to have surgery, tell your doctor or health care professional that you are taking this medicine. Tell your health care professional that you use this medicine before you have a spinal or epidural procedure. Sometimes people who take this medicine have bleeding problems around the spine when they have a spinal or epidural procedure. This bleeding is very rare.  If you have a spinal or epidural procedure while on this medicine, call your health care professional immediately if you have back pain, numbness or tingling (especially in your legs and feet), muscle weakness, paralysis, or loss of bladder or bowel control. Avoid sports and activities that might cause injury while you are using this medicine. Severe falls or injuries can cause unseen bleeding. Be careful when using sharp tools or knives. Consider using an Copy. Take special care brushing or flossing your teeth. Report any injuries, bruising, or red spots on the skin to your doctor or health care professional. What side effects may I notice from receiving this medicine? Side effects that you should report to your doctor or health care professional as soon as possible: -allergic reactions like skin rash, itching or hives, swelling of the face, lips, or tongue -back pain -redness, blistering, peeling or loosening of the skin, including inside the mouth -signs and symptoms of bleeding such as bloody or Feighner, tarry stools; red or dark-brown urine; spitting up blood or brown  material that looks like coffee grounds; red spots on the skin; unusual bruising or bleeding from the eye, gums, or nose Side effects that usually do not require medical attention (Report these to your doctor or health care professional if they continue or are bothersome.): -dizziness -muscle pain This list may not describe all possible side effects. Call your doctor for medical advice about side effects. You may report side effects to FDA at 1-800-FDA-1088. Where should I keep my medicine? Keep out of the reach of children. Store at room temperature between 15 and 30 degrees C (59 and 86 degrees F). Throw away any unused medicine after the expiration date. NOTE: This sheet is a summary. It may not cover all possible information. If you have questions about this medicine, talk to your doctor, pharmacist, or health care  provider.  2015, [employer]vier/[employer] Standard. (2013-06-22 18:47:48)

## 2013-10-27 NOTE — Progress Notes (Signed)
Patient ID: [name], female   DOB: [birth year], 79 y.o.   MRN: [id number]  Called to evaluate patient for bleeding from her sacral ulcer. Per nursing she had the wound debrided earlier today and it has been oozing since. They estimated her blood loss at 268mL. Her vitals remained stable. They asked me to come evaluate.  When I arrived the patient was lying in a large pool of blood which was seeping through multiple layers of bedding. Her ulcer was bleeding slowly with what appeared to be a pulsatile/arterial bleeding. I packed it and had the staff keep her on her back to help tampanad the bleeding while they transported her to the ED.

## 2013-10-27 NOTE — ED Provider Notes (Addendum)
CSN: [id number]     Arrival date & time 10/27/13  0014 History   First MD Initiated Contact with Patient 10/27/13 0015     No chief complaint on file.    (Consider location/radiation/quality/duration/timing/severity/associated sxs/prior Treatment) HPI  Ms. Leinbach is a 79yo female with a past medical history of DVT currently on xarelto here for rectal bleeding.  Patient was transferred from [facility] to this facility for further evaluation. Patient is nonverbal secondary to his prior stroke. Per transfer records the patient has bleeding from her rectum and from a sacral wound since 8 PM. The physician at [facility] noted this. Because the bleeding persisted the patient was transferred for further evaluation. Patient does not appear to be in any acute distress and is able to follow my commands. Review systems is unable to be obtained.  Past Medical History  Diagnosis Date  . Aphasia   . Muscle weakness   . Malaise   . Fatigue   . Breast neoplasm   . Hypertension   . Glaucoma   . UTI (lower urinary tract infection)   . Vitamin D deficiency   . Hiatal hernia   . Constipation   . Diarrhea   . Asthma   . Obesity   . Hyperlipidemia   . Alzheimer's dementia   . Depression   . Allergic rhinitis   . Dementia   . DVT (deep venous thrombosis) 2014    "put her on Xeralto" (01/20/2013)  . Pneumonia 2014  . Shortness of breath     "lately; just laying there" (01/20/2013)  . Type II diabetes mellitus   . Anemia   . History of blood transfusion     "got a unit yesterday" (01/20/2013)  . Stroke 2006; 2007    residual:  "mute, dementia, generalized weakness" (01/20/2013)  . Recurrent UTI     "at least 3 since 2008 when she went into nursing home" (01/20/2013)  . Breast cancer     "right" (01/20/2013)  . Depression   . Hospice care patient     active with [facility] - call [phone] with encounters  . C. difficile colitis 02/23/2013  . Coagulase negative Staphylococcus bacteremia 02/12/2013  .  Sepsis due to Klebsiella pneumoniae 03/30/2013   Past Surgical History  Procedure Laterality Date  . Joint replacement      left knee in '90s  . Breast lumpectomy Right 1990's; 2000's    "she's had 2" (01/20/2013)  . Replacement total knee bilateral Bilateral 1990's  . Tee without cardioversion N/A 02/17/2013    Procedure: TRANSESOPHAGEAL ECHOCARDIOGRAM (TEE);  Surgeon: Dorothy Spark, MD;  Location: [facility] ENDOSCOPY;  Service: Cardiovascular;  Laterality: N/A;   No family history on file. History  Substance Use Topics  . Smoking status: Never Smoker   . Smokeless tobacco: Never Used  . Alcohol Use: No   OB History   Grav Para Term Preterm Abortions TAB SAB Ect Mult Living                 Review of Systems  Unable to perform ROS: Patient nonverbal      Allergies  Penicillins; Tetanus toxoids; and Vicodin  Home Medications   Prior to Admission medications   Medication Sig Start Date End Date Taking? Authorizing Provider  acetaminophen (TYLENOL) 325 MG tablet Take 650 mg by mouth 2 (two) times daily as needed for mild pain.   Yes Historical Provider, MD  atorvastatin (LIPITOR) 20 MG tablet Take 20 mg by mouth daily.  Yes Historical Provider, MD  calcium citrate (CALCITRATE - DOSED IN MG ELEMENTAL CALCIUM) 950 MG tablet Take 400 mg of elemental calcium by mouth daily.    Yes Historical Provider, MD  cloNIDine (CATAPRES - DOSED IN MG/24 HR) 0.1 mg/24hr patch Place 0.1 mg onto the skin once a week. On Wednesday   Yes Historical Provider, MD  dextromethorphan-guaiFENesin [facility] DM) 30-600 MG per 12 hr tablet Take 1 tablet by mouth 2 (two) times daily as needed for cough.   Yes Historical Provider, MD  ergocalciferol (VITAMIN D2) 50000 UNITS capsule Take 50,000 Units by mouth every 30 (thirty) days. On the 15th of every month   Yes Historical Provider, MD  fluticasone (FLONASE) 50 MCG/ACT nasal spray Place 1 spray into both nostrils daily.   Yes Historical Provider, MD  lisinopril  (PRINIVIL,ZESTRIL) 2.5 MG tablet Take 2.5 mg by mouth daily.   Yes Historical Provider, MD  dipyridamole-aspirin (AGGRENOX) 200-25 MG per 12 hr capsule Take 1 capsule by mouth 2 (two) times daily.    Historical Provider, MD  polyethylene glycol powder (GLYCOLAX/MIRALAX) powder Take 17 g by mouth daily as needed for mild constipation. FOR CONSTIPATION 02/22/13   Bernadene Bell, MD  promethazine (PHENERGAN) 25 MG tablet Take 25 mg by mouth every 6 (six) hours as needed for nausea. 02/22/13   Bernadene Bell, MD  ranitidine (ZANTAC) 150 MG tablet Take 150 mg by mouth every evening. 02/22/13   Bernadene Bell, MD  Rivaroxaban (XARELTO) 20 MG TABS tablet Take 20 mg by mouth daily.    Historical Provider, MD  Travoprost, BAK Free, (TRAVATAN) 0.004 % SOLN ophthalmic solution Place 1 drop into both eyes at bedtime. 02/22/13   Bernadene Bell, MD   BP 113/70  Pulse 89  Resp 10  SpO2 100% Physical Exam  Nursing note and vitals reviewed. Constitutional: She is oriented to person, place, and time. She appears well-developed and well-nourished. No distress.  HENT:  Head: Normocephalic and atraumatic.  Patient would not open mouth.  Eyes: Conjunctivae and EOM are normal. Pupils are equal, round, and reactive to light. No scleral icterus.  Neck: No JVD present. No tracheal deviation present. No thyromegaly present.  Cardiovascular: Normal rate and regular rhythm.  Exam reveals no gallop and no friction rub.   Murmur heard. Systolic murmur heard.  Pulmonary/Chest: Effort normal and breath sounds normal. No respiratory distress. She has no wheezes. She exhibits no tenderness.  Abdominal: Soft. Bowel sounds are normal. She exhibits no distension and no mass. There is no tenderness. There is no rebound and no guarding.  Large umbilical hernia noted. Easily reducible.  Lymphadenopathy:    She has no cervical adenopathy.  Neurological: She is alert and oriented to person, place, and time.  Skin: Skin is  warm and dry. No rash noted. She is not diaphoretic. No erythema. No pallor.  Patient has a 2 cm circumferential stage III decubitus ulcer of on her sacrum. There is active venous oozing from the site. No arterial bleeding was seen. There is dried blood throughout her perineum.    ED Course  Procedures (including critical care time) Labs Review Labs Reviewed  CBC WITH DIFFERENTIAL - Abnormal; Notable for the following:    Hemoglobin 10.3 (*)    HCT 33.0 (*)    MCH 25.4 (*)    RDW 16.9 (*)    Neutrophils Relative % 42 (*)    All other components within normal limits  BASIC METABOLIC PANEL - Abnormal; Notable  for the following:    Glucose, Bld 111 (*)    BUN 37 (*)    GFR calc non Af Amer 62 (*)    GFR calc Af Amer 72 (*)    All other components within normal limits  PROTIME-INR - Abnormal; Notable for the following:    Prothrombin Time 22.8 (*)    INR 2.01 (*)    All other components within normal limits  APTT - Abnormal; Notable for the following:    aPTT 59 (*)    All other components within normal limits  CBC WITH DIFFERENTIAL - Abnormal; Notable for the following:    Hemoglobin 10.5 (*)    HCT 32.9 (*)    RDW 16.9 (*)    All other components within normal limits  POC OCCULT BLOOD, ED - Abnormal; Notable for the following:    Fecal Occult Bld POSITIVE (*)    All other components within normal limits  POC OCCULT BLOOD, ED  TYPE AND SCREEN    Imaging Review No results found.   EKG Interpretation None      MDM   Final diagnoses:  None    History is difficult to be obtained from this patient as she is nonverbal. Per documentation patient seems to have been sent here for evaluation for rectal bleeding. I see no evidence of rectal bleeding on my physical exam, her as I see bleeding from her decubitus ulcer. Patient is on Xarelto which is obviously contributing to this bleeding. Despite cleaning the area of the patient's Hemoccult was positive for blood. However I do  not believe this is coming from the rectum. The patient's hemoglobin is 10.3 which is on the higher end of her baseline. However I will perform a repeat CBC in 3 hours to see if the hemoglobin drops. Patient remains medically stable throughout this emergency department course.   Everlene Balls, MD 10/27/13 0249  Upon my repeat assessment of the patient her vital signs remained stable and she is in no acute distress. Patient's hemoglobin has stayed stable throughout her emergency department course from 10.3-10.5. Patient is now safe for discharge and will be transferred back to [facility].  Everlene Balls, MD 10/27/13 [phone]

## 2013-10-27 NOTE — ED Notes (Signed)
Pt transported back [facility] by Corey Harold- discharge instructions reviewed w/ Legrand Rams, caregiver at [facility] - denies any further questions or concerns at present.

## 2013-10-27 NOTE — Assessment & Plan Note (Signed)
Stable. Continue Travatan.

## 2013-10-27 NOTE — Assessment & Plan Note (Signed)
Stable. Symptoms appear controlled currently.

## 2013-10-27 NOTE — Assessment & Plan Note (Signed)
Severe Aortic Stenosis. Asymptomatic since patient inactive. Avoid Afterload and Preload reducing medications or changes in medications that would decrease these cardiac load factors.

## 2013-10-27 NOTE — Assessment & Plan Note (Signed)
Given that repeat leg venous US did not show DVT in December and patient has undergon over 10 months of anticoagulation with NOAC mediation and the wound bleeding last night, likely ongoing need for wound debridement by WOC in future, and the need for PRBC transfusions since start of rivaroxaban, the [facility], [name], agreed with my recommendation to stop the rivaroxaban.

## 2013-10-27 NOTE — Assessment & Plan Note (Signed)
Improved from yesterday. Stable hemodynamically./ Checking CBC tomorrow. Stopping Rivaroxaban. Given Increase INR will give Vitamin K 10 mg daily oral for 3 days empirically.

## 2013-10-27 NOTE — Assessment & Plan Note (Signed)
Stable. Continue Aggrenox.

## 2013-10-27 NOTE — Progress Notes (Signed)
Error

## 2013-10-27 NOTE — Assessment & Plan Note (Signed)
Advanced Stage 7c to 7d Dementia without behavioral issues.  Common complications of advanced dementia of recurrent sepsis, three hospitalization with sepsis in last 2 years, and Stage 4 terminal pressure ulcers.   Continue to emphasize compfort. Pt on scheduled APAP and Tramadol which appears to be adequate currently.

## 2013-10-27 NOTE — Progress Notes (Signed)
Subjective:    Patient ID: [name], female    DOB: 13-Nov-1934, 79 y.o.   MRN: [id number] I spoke with patient's sister, [name],  who is patient's HCPOA. I saw patient with Dr Jerilynn Mages. [place]kke[place]andam.   HPI  Bleeding from Stage 4 right Buttock sacral wound - Nursing reported excessive bleeding from buttock pressure ulcer wound yesterday following surgical debridement of necrotic muscle tissue by Wound Care.  - Pt evaluated by FMTS resident on call by bedside exam.  Concern for arterial bleed.  Pt transferred to ED for evaluation and monitoring and management. - ED blood pressure 113/70, P89, SaO2 100%. Only slow "oozing" generally from wound noted.  - Serial Hgb in ED stable around 10 g/dL.  INR was 2.0.  Pt discharged home without therpeutic intervention.   - Minimal blood on dressing overnite per nursing.     Cardiovascular and Mediastinum   DVT (deep venous thrombosis) - 12/16/2012 Geri resident obtained leg veinous [place] (visiting radiology) for asymmetric L leg slightly larger and with pitting edema on exam in nursing home. Pt ad a left superficial femoral vein clot. - 02/18/2014 bilateral lower extremity doppler venous dopplers performed by [facility] vascular lab did not evidence of DVT.  - Pt on Rivaroxaban since 12/2012. - Pt has required blood transfusions since start of Rivaroxaban     Aortic stenosis - Echocardiogram 02/18/13: Aortic valve: Valve mobility was restricted. There was severe stenosis.  Indexed valve area:0.61cm^2/m^2 (VTI). Peak velocity ratio of LVOT to aortic valve: 0.31. Indexed valve area: 0.53cm^2/m^2 (Vmax).  Mean gradient: 55mm Hg.  Estimated Ejection Fraction was in the range of 55% to 60%.  Doppler parameters are consistent with high ventricular filling pressure. - No clear evidence of volume overload nor inadequate cardiac output.      Nervous and Auditory   Vascular dementia -  Onset: 2012 - Brain MRI 01/14/2013: Remote lacunar infarcts of the cerebellum  bilaterally, right greater than left. Remote old encephalomalacia of the left parietal and occipital lobe as well as the anterior right frontal lobe.  - FAST Stage 7C to 7D (7c - no longer walks; 7d - no longer sits)  with complications of recurrent sepsis and pressure ulcer. - Paliative Performance Scale 30% - No behavioral disturbances reported. - Change in ADLs / iADLs: Dependent in all ADLs except eating.  - Dementia Medications compliance: On no Dementia or Behavioral/Psychological Symptoms of Dementia medications.        Musculoskeletal and Integument   Stage IV pressure ulcer of sacral region (Chronic) - Onset in November 2014.   - WOC following.  Treating with daily Santyl and alginate for excessive serousal drainage. - S/P necrotic muscle debridement by [facility] 10/26/13.     Other   Hospice care patient - Primary - Pt enrolled in Hospice - Probable Hospice qualifying Diagnosis with [facility].  - (+) DNR: emphasize comfort, no feeding tubes, treat reversible conditions. HCPOA: Sister, [name].  -    H/O: CVA (cerebrovascular accident) - Right Hemiparesis and expressive Aphasia. - Brain MRI 01/14/2013: Remote lacunar infarcts of the cerebellum bilaterally, right greater than left. Remote old encephalomalacia of the left parietal and occipital lobe as well as the anterior right frontal lobe.  - On Aggrenox.  Not on statin as part of pill burden reduction in patient on hospice.  - Adequate BP control with just Clonidine patch.  Not on thiazide and ACEI because of recurrent history of AKI with illnesses' concomitant dehydration.  Medication list was reconciled with [facility] today.     Review of Systems- Patient unable to provide answers secondary to aphasia.       Objective:   Physical Exam VS reviewed GEN: Eyes open and focuses on face, follows one-step commends COR: RRR, 3/6 systolic murmur with radiation bilateral carotids.  No JVD lying flat in bed.  No  thrill of heave palp. LUNGS: BCTA, No Acc mm use ABDOMEN: (+)BS, soft, NT, ND, No HSM, No palpable masses Back: Open stage 4 wound Approximately 4 cm x 5 cm with superior undermining and medial tunnel with palp bone spicule.  Base with glistening granualar base with spotty superficial oozing blood surface.   EXT: Heels without lesions          Assessment & Plan:

## 2013-10-29 ENCOUNTER — Telehealth: Payer: Self-pay | Admitting: Family Medicine

## 2013-10-29 NOTE — Telephone Encounter (Signed)
Received incoming call from [facility] regarding [name] hgb; was recently 10.3 after GI bleed seen in the ED (on xarelto) Stopped xarelto. After discussion with HCPOA (mary coleman) hgb on repeat check 8.7. No further signs of bleed. Pt appearing comfortable. No tachycardia.  Would recommend repeat check in 1 week to demonstrate stability.  Information communicated to [facility]. Will forward to [facility] team. [facility], MD

## 2013-10-30 ENCOUNTER — Other Ambulatory Visit: Payer: Self-pay | Admitting: Family Medicine

## 2013-10-30 DIAGNOSIS — Z515 Encounter for palliative care: Secondary | ICD-10-CM

## 2013-10-30 MED ORDER — TRAMADOL HCL 50 MG PO TABS: 50.0000 mg | ORAL_TABLET | Freq: Three times a day (TID) | ORAL | Status: DC

## 2013-10-30 NOTE — Telephone Encounter (Signed)
Monitoring CBC ordered for 11/02/13. No reports of active bleeding from pressure wound by nursing.

## 2013-10-31 ENCOUNTER — Encounter: Payer: Self-pay | Admitting: Pharmacist

## 2013-10-31 NOTE — Telephone Encounter (Signed)
Will forward to Dr Wendi Snipes, current [facility] resident.  Hilton Sinclair, MD

## 2013-11-04 LAB — CBC AND DIFFERENTIAL
HEMATOCRIT: 28 % — AB (ref 36–46)
Hemoglobin: 9 g/dL — AB (ref 12.0–16.0)
PLATELETS: 292 10*3/uL (ref 150–399)
WBC: 6.4 10*3/mL

## 2013-11-06 ENCOUNTER — Encounter: Payer: Self-pay | Admitting: Family Medicine

## 2013-12-06 ENCOUNTER — Encounter: Payer: Self-pay | Admitting: Pharmacist

## 2013-12-07 ENCOUNTER — Non-Acute Institutional Stay: Admitting: Family Medicine

## 2013-12-07 DIAGNOSIS — F0150 Vascular dementia without behavioral disturbance: Secondary | ICD-10-CM

## 2013-12-07 DIAGNOSIS — I050 Rheumatic mitral stenosis: Secondary | ICD-10-CM

## 2013-12-07 DIAGNOSIS — I10 Essential (primary) hypertension: Secondary | ICD-10-CM

## 2013-12-07 DIAGNOSIS — Z515 Encounter for palliative care: Secondary | ICD-10-CM

## 2013-12-07 DIAGNOSIS — L89109 Pressure ulcer of unspecified part of back, unspecified stage: Secondary | ICD-10-CM

## 2013-12-07 DIAGNOSIS — L8994 Pressure ulcer of unspecified site, stage 4: Secondary | ICD-10-CM

## 2013-12-07 DIAGNOSIS — I350 Nonrheumatic aortic (valve) stenosis: Secondary | ICD-10-CM

## 2013-12-07 DIAGNOSIS — I359 Nonrheumatic aortic valve disorder, unspecified: Secondary | ICD-10-CM

## 2013-12-07 DIAGNOSIS — M159 Polyosteoarthritis, unspecified: Secondary | ICD-10-CM

## 2013-12-07 DIAGNOSIS — L89154 Pressure ulcer of sacral region, stage 4: Secondary | ICD-10-CM

## 2013-12-07 NOTE — Progress Notes (Signed)
Patient ID: [name], female   DOB: 1934/11/05, 79 y.o.   MRN: [id number]   Subjective:  [name] is a 79 y.o. female resident of [facility] seen for follow up.  History is limited as pt is expressively aphasic. HCPOA is [name]. She is under hospice care and her code status is DNR.   She suffers from advanced dementia and cerebrovascular disease. She continues to take aggrenox due to history of CVA causing residual right-sided weakness and persistent aphasia.  She has recovered well from a closed fracture of her right femur of unknown mechanism. Pain control seems to be stable with schedule tylenol and tramadol as pt unable to reliably vocalize pain.   She has a history of DVT 12/16/2012 with venous dopplers negative upon recheck 02/18/2013. She discontinued xarelto after 10 months of treatment due to bleeding from stage IV sacral pressure wound and transfusion requirement.  There have been no signs of worsening of glaucoma or problems applying travatan.   ROS is not obtained due to aphasia. Objective:  98.53F - HR 60 - RR 20 - 123/72 - 218.4lbs Wt Readings from Last 3 Encounters:  10/25/13 212 lb 12.8 oz (96.525 kg)  08/22/13 199 lb 12.8 oz (90.629 kg)  07/14/13 201 lb 9.6 oz (91.445 kg)   Gen: 79 y.o. female resting quietly in NAD CV: Regular rate with III/VI harsh systolic murmur with radiation to carotids bilaterally. No JVD.  Resp: Non-labored, CTAB Abd: Soft, NTND, BS present, no guarding or organomegaly Skin: Open pressure ulcer with superficial drainage without purulence. ~1.5 x 2.0 in. Neuro: Alert, eyes tracking, follows one-step commands, severely aphasic.  Assessment & Plan:  [name] is a 79 y.o. female with:  Problem List Items Addressed This Visit     Cardiovascular and Mediastinum   Aortic stenosis     Mitral and aortic valve stenosis with most recent echo (02/18/13 showing EF 55-60% and high filling pressures). She has no symptoms as she  experiences no exertion. Will continue to avoid afterload and preload reducing medications (on clonidine only).      Hypertension     Chronic, improving. At goal on most recent checks. Continue clonidine alone.     Mitral stenosis     Nervous and Auditory   Vascular dementia - Primary     Chronic, terminal, stable. Code status DNR. HCPOA: [name]. Landscape architect.       Musculoskeletal and Integument   Stage IV pressure ulcer of sacral region (Chronic)     Stable, chronic; no signs of infection. Continue NH wound care and standard positional precautions.       Other   Hospice care patient     Continue pain control as above and comfort care.

## 2013-12-11 NOTE — Assessment & Plan Note (Signed)
Continue pain control as above and comfort care.

## 2013-12-11 NOTE — Assessment & Plan Note (Addendum)
Stable, chronic; no signs of infection. Continue NH wound care and standard positional precautions.

## 2013-12-11 NOTE — Assessment & Plan Note (Signed)
Chronic, improving. At goal on most recent checks. Continue clonidine alone.

## 2013-12-11 NOTE — Assessment & Plan Note (Signed)
Mitral and aortic valve stenosis with most recent echo (02/18/13 showing EF 55-60% and high filling pressures). She has no symptoms as she experiences no exertion. Will continue to avoid afterload and preload reducing medications (on clonidine only).

## 2013-12-11 NOTE — Assessment & Plan Note (Addendum)
Chronic, terminal, stable. Code status DNR. HCPOA: [name]. Landscape architect.

## 2013-12-19 DIAGNOSIS — F319 Bipolar disorder, unspecified: Secondary | ICD-10-CM | POA: Insufficient documentation

## 2013-12-19 DIAGNOSIS — E119 Type 2 diabetes mellitus without complications: Secondary | ICD-10-CM | POA: Insufficient documentation

## 2013-12-19 DIAGNOSIS — M159 Polyosteoarthritis, unspecified: Secondary | ICD-10-CM | POA: Insufficient documentation

## 2013-12-22 NOTE — Progress Notes (Signed)
Patient ID: [name], female   DOB: [birth year], 79 y.o.   MRN: [id number] I have seen and examined this patient. I have discussed with Dr Bonner Puna.  I agree with their findings and plans as documented in their regulatory visit note.

## 2013-12-28 ENCOUNTER — Encounter (HOSPITAL_COMMUNITY): Payer: Self-pay | Admitting: Pharmacist

## 2013-12-28 ENCOUNTER — Encounter: Payer: Self-pay | Admitting: Pharmacist

## 2014-01-01 ENCOUNTER — Non-Acute Institutional Stay: Payer: Medicare Other | Admitting: Family Medicine

## 2014-01-01 ENCOUNTER — Encounter: Payer: Self-pay | Admitting: Family Medicine

## 2014-01-01 ENCOUNTER — Non-Acute Institutional Stay (INDEPENDENT_AMBULATORY_CARE_PROVIDER_SITE_OTHER): Payer: Medicare Other | Admitting: Family Medicine

## 2014-01-01 DIAGNOSIS — L89154 Pressure ulcer of sacral region, stage 4: Secondary | ICD-10-CM

## 2014-01-01 NOTE — Progress Notes (Signed)
   Subjective:    Patient ID: [name], female    DOB: September 14, 1934, 79 y.o.   MRN: [id number]  HPI 79 y/o female currently on Hospice. Resident on call Dr. Ardelia Mems called to evaluate sacral wound. Patient has stage 4 sacral ulceration that was noted to have acute worsening over the week. Per the wound care nurse the would appears slightly more necrotic today, previously stable, current treatment includes Santyl and Calcium Alginate. No fevers. Patient is bed bound and unable to turn her self. Has an indwelling foley catheter.    Review of Systems     Objective:   Physical Exam Vitals: Temp 98.6, BP 140/79, HR 99, SP02 97% RA Skin: Examined with nursing staff present, approx. 5 cm by 5 cm stage 4 sacral ulceration, surrounding maceration present, undermining present, slight necrosis present at base, bone is palpable at base       Assessment & Plan:  Please see problem specific assessment and plan.

## 2014-01-01 NOTE — Progress Notes (Signed)
Note opened in error.

## 2014-01-01 NOTE — Assessment & Plan Note (Signed)
Patient has worsening sacral ulceration. Mild necrosis present. Followed by wound care team. Discussed with wound care nurse and Dr. McDiarmid. -as patient is Hospice this is likely a terminal ulceration. No current systemic signs of infection. -continue local wound care with Santyl and Calcium Alginate -have wound care physician evaluate when he comes in 3 days -monitor for signs of infection

## 2014-01-01 NOTE — Progress Notes (Signed)
Opened in error

## 2014-01-02 ENCOUNTER — Non-Acute Institutional Stay (INDEPENDENT_AMBULATORY_CARE_PROVIDER_SITE_OTHER): Payer: Medicare Other | Admitting: Family Medicine

## 2014-01-02 ENCOUNTER — Telehealth: Payer: Self-pay | Admitting: Family Medicine

## 2014-01-02 DIAGNOSIS — L89154 Pressure ulcer of sacral region, stage 4: Secondary | ICD-10-CM

## 2014-01-02 NOTE — Telephone Encounter (Signed)
Called by [facility] nurse for CXR reports  Cardiac enlargement, calcified mitral annulus, R hilar fullness consider vasc vs neoplasia, and small L pleural effusion.   Earlier she had a fever and was started on PO clinda and cipro to cover possible infection of sacral decub. She has RR 19-20 and is satting 95% now. Earlier sats decreased to 91%.  The nurse states that she is not in respiratory distress and that she is doing as well as expected currently.   As she is no acute distress i will continue current treatment. I will ask the team to lay eyes on her in the am and check her volume status.   Laroy Apple, MD [place] Resident, PGY-3 01/02/2014, 11:13 PM

## 2014-01-02 NOTE — Progress Notes (Signed)
Patient ID: [name], female   DOB: 1934-12-20, 79 y.o.   MRN: [id number]  Bent by nursing home staff who reported that patient had a fever of 100.4 Fahrenheit rectally earlier today. Patient has known terminal sacral ulceration. Went to nursing home to put an antibiotic orders. After attempting to call patient's power of attorney, her sister [name], who did not answer, as a pressure and it nursing staff he requested that ago and patient's room to speak with her family. [name] was present in the room, and was very upset at the level of care her sister has been receiving. She is displeased the patient is continually allowed to lay in supine position despite having this sacral decubitus ulcer. She's also displeased with the type of mattress the patient has. She was specific with me that she would want Ms. Gresham hospitalized should she require this. Patient is not tachycardic or hypotensive.  Exam: General: No acute distress, lying in bed appears comfortable. Heart: Regular rate and rhythm, harsh III-IV/VI systolic murmur loudest at left upper sternal border. Lungs: No significant increased work of breathing, lungs clear to auscultation on the right the anterior auscultation. Left-sided breath sounds decreased but present. Did not examine sacral decubitus ulcer as this was just examined yesterday by Dr. Ree Kida.  Plan: -Start Cipro and clindamycin orally for her wound infection with fever -Check chest x-ray tonight to evaluate for any airspace disease -Geriatrics team to followup tomorrow during nursing home rounds to speak with family and reevaluate patient. -Will discuss with nursing staff how to better achieve optimal positioning and ideal mattress.  Chrisandra Netters, MD Family Medicine PGY-3  Geriatrics Resident

## 2014-01-03 ENCOUNTER — Emergency Department (HOSPITAL_COMMUNITY)

## 2014-01-03 ENCOUNTER — Encounter (HOSPITAL_COMMUNITY): Payer: Self-pay | Admitting: Emergency Medicine

## 2014-01-03 ENCOUNTER — Non-Acute Institutional Stay: Admitting: Family Medicine

## 2014-01-03 ENCOUNTER — Inpatient Hospital Stay (HOSPITAL_COMMUNITY)
Admission: EM | Admit: 2014-01-03 | Discharge: 2014-01-10 | DRG: 871 | Disposition: A | Discharge status: Skilled Nursing Facility | Attending: Family Medicine | Admitting: Family Medicine

## 2014-01-03 DIAGNOSIS — G309 Alzheimer's disease, unspecified: Secondary | ICD-10-CM | POA: Diagnosis present

## 2014-01-03 DIAGNOSIS — E119 Type 2 diabetes mellitus without complications: Secondary | ICD-10-CM | POA: Diagnosis present

## 2014-01-03 DIAGNOSIS — Z8673 Personal history of transient ischemic attack (TIA), and cerebral infarction without residual deficits: Secondary | ICD-10-CM

## 2014-01-03 DIAGNOSIS — F0280 Dementia in other diseases classified elsewhere without behavioral disturbance: Secondary | ICD-10-CM | POA: Diagnosis present

## 2014-01-03 DIAGNOSIS — M869 Osteomyelitis, unspecified: Secondary | ICD-10-CM | POA: Diagnosis present

## 2014-01-03 DIAGNOSIS — L89154 Pressure ulcer of sacral region, stage 4: Secondary | ICD-10-CM | POA: Diagnosis present

## 2014-01-03 DIAGNOSIS — R1310 Dysphagia, unspecified: Secondary | ICD-10-CM | POA: Diagnosis present

## 2014-01-03 DIAGNOSIS — I129 Hypertensive chronic kidney disease with stage 1 through stage 4 chronic kidney disease, or unspecified chronic kidney disease: Secondary | ICD-10-CM | POA: Diagnosis present

## 2014-01-03 DIAGNOSIS — E11622 Type 2 diabetes mellitus with other skin ulcer: Secondary | ICD-10-CM

## 2014-01-03 DIAGNOSIS — E559 Vitamin D deficiency, unspecified: Secondary | ICD-10-CM | POA: Diagnosis present

## 2014-01-03 DIAGNOSIS — T8351XA Infection and inflammatory reaction due to indwelling urinary catheter, initial encounter: Secondary | ICD-10-CM | POA: Diagnosis present

## 2014-01-03 DIAGNOSIS — I350 Nonrheumatic aortic (valve) stenosis: Secondary | ICD-10-CM

## 2014-01-03 DIAGNOSIS — L89312 Pressure ulcer of right buttock, stage 2: Secondary | ICD-10-CM | POA: Diagnosis present

## 2014-01-03 DIAGNOSIS — Z79899 Other long term (current) drug therapy: Secondary | ICD-10-CM | POA: Diagnosis not present

## 2014-01-03 DIAGNOSIS — E785 Hyperlipidemia, unspecified: Secondary | ICD-10-CM | POA: Diagnosis present

## 2014-01-03 DIAGNOSIS — Z86718 Personal history of other venous thrombosis and embolism: Secondary | ICD-10-CM | POA: Diagnosis not present

## 2014-01-03 DIAGNOSIS — L089 Local infection of the skin and subcutaneous tissue, unspecified: Secondary | ICD-10-CM

## 2014-01-03 DIAGNOSIS — Z853 Personal history of malignant neoplasm of breast: Secondary | ICD-10-CM

## 2014-01-03 DIAGNOSIS — E870 Hyperosmolality and hypernatremia: Secondary | ICD-10-CM | POA: Diagnosis present

## 2014-01-03 DIAGNOSIS — N182 Chronic kidney disease, stage 2 (mild): Secondary | ICD-10-CM | POA: Diagnosis present

## 2014-01-03 DIAGNOSIS — N390 Urinary tract infection, site not specified: Secondary | ICD-10-CM | POA: Diagnosis present

## 2014-01-03 DIAGNOSIS — A419 Sepsis, unspecified organism: Secondary | ICD-10-CM | POA: Diagnosis present

## 2014-01-03 DIAGNOSIS — Z66 Do not resuscitate: Secondary | ICD-10-CM | POA: Diagnosis present

## 2014-01-03 DIAGNOSIS — J45909 Unspecified asthma, uncomplicated: Secondary | ICD-10-CM | POA: Diagnosis present

## 2014-01-03 DIAGNOSIS — I2510 Atherosclerotic heart disease of native coronary artery without angina pectoris: Secondary | ICD-10-CM | POA: Diagnosis present

## 2014-01-03 DIAGNOSIS — L8994 Pressure ulcer of unspecified site, stage 4: Secondary | ICD-10-CM

## 2014-01-03 DIAGNOSIS — Z515 Encounter for palliative care: Secondary | ICD-10-CM

## 2014-01-03 DIAGNOSIS — L98499 Non-pressure chronic ulcer of skin of other sites with unspecified severity: Secondary | ICD-10-CM

## 2014-01-03 DIAGNOSIS — E860 Dehydration: Secondary | ICD-10-CM | POA: Diagnosis present

## 2014-01-03 DIAGNOSIS — M4628 Osteomyelitis of vertebra, sacral and sacrococcygeal region: Secondary | ICD-10-CM

## 2014-01-03 DIAGNOSIS — E875 Hyperkalemia: Secondary | ICD-10-CM | POA: Diagnosis not present

## 2014-01-03 DIAGNOSIS — T83511A Infection and inflammatory reaction due to indwelling urethral catheter, initial encounter: Secondary | ICD-10-CM

## 2014-01-03 DIAGNOSIS — F0150 Vascular dementia without behavioral disturbance: Secondary | ICD-10-CM | POA: Diagnosis present

## 2014-01-03 HISTORY — DX: Osteomyelitis of vertebra, sacral and sacrococcygeal region: M46.28

## 2014-01-03 LAB — URINALYSIS, ROUTINE W REFLEX MICROSCOPIC
Bilirubin Urine: NEGATIVE
Glucose, UA: NEGATIVE mg/dL
Ketones, ur: NEGATIVE mg/dL
Nitrite: NEGATIVE
Protein, ur: 100 mg/dL — AB
Specific Gravity, Urine: 1.027 (ref 1.005–1.030)
Urobilinogen, UA: 1 mg/dL (ref 0.0–1.0)
pH: 5.5 (ref 5.0–8.0)

## 2014-01-03 LAB — BASIC METABOLIC PANEL
Anion gap: 14 (ref 5–15)
BUN: 53 mg/dL — ABNORMAL HIGH (ref 6–23)
CO2: 24 mEq/L (ref 19–32)
Calcium: 9.3 mg/dL (ref 8.4–10.5)
Chloride: 122 mEq/L — ABNORMAL HIGH (ref 96–112)
Creatinine, Ser: 0.83 mg/dL (ref 0.50–1.10)
GFR calc Af Amer: 77 mL/min — ABNORMAL LOW (ref >90)
GFR calc non Af Amer: 66 mL/min — ABNORMAL LOW (ref >90)
Glucose, Bld: 210 mg/dL — ABNORMAL HIGH (ref 70–99)
Potassium: 4.6 mEq/L (ref 3.7–5.3)
Sodium: 160 mEq/L — ABNORMAL HIGH (ref 137–147)

## 2014-01-03 LAB — URINE MICROSCOPIC-ADD ON

## 2014-01-03 LAB — CBC WITH DIFFERENTIAL/PLATELET
Basophils Absolute: 0 10*3/uL (ref 0.0–0.1)
Basophils Relative: 0 % (ref 0–1)
Eosinophils Absolute: 0.1 10*3/uL (ref 0.0–0.7)
Eosinophils Relative: 1 % (ref 0–5)
HCT: 33.7 % — ABNORMAL LOW (ref 36.0–46.0)
Hemoglobin: 10.2 g/dL — ABNORMAL LOW (ref 12.0–15.0)
Lymphocytes Relative: 17 % (ref 12–46)
Lymphs Abs: 1.8 10*3/uL (ref 0.7–4.0)
MCH: 24 pg — ABNORMAL LOW (ref 26.0–34.0)
MCHC: 30.3 g/dL (ref 30.0–36.0)
MCV: 79.3 fL (ref 78.0–100.0)
Monocytes Absolute: 0.6 10*3/uL (ref 0.1–1.0)
Monocytes Relative: 6 % (ref 3–12)
Neutro Abs: 8.2 10*3/uL — ABNORMAL HIGH (ref 1.7–7.7)
Neutrophils Relative %: 76 % (ref 43–77)
Platelets: 286 10*3/uL (ref 150–400)
RBC: 4.25 MIL/uL (ref 3.87–5.11)
RDW: 17.7 % — ABNORMAL HIGH (ref 11.5–15.5)
WBC: 10.7 10*3/uL — ABNORMAL HIGH (ref 4.0–10.5)

## 2014-01-03 LAB — MRSA PCR SCREENING: MRSA by PCR: POSITIVE — AB

## 2014-01-03 LAB — LACTIC ACID, PLASMA: Lactic Acid, Venous: 1.3 mmol/L (ref 0.5–2.2)

## 2014-01-03 MED ORDER — FENTANYL CITRATE 0.05 MG/ML IJ SOLN
50.0000 ug | Freq: Once | INTRAMUSCULAR | Status: AC
  Administered 2014-01-03: 50 ug via INTRAVENOUS
  Filled 2014-01-03: qty 2

## 2014-01-03 MED ORDER — ONDANSETRON HCL 4 MG PO TABS: 4.0000 mg | ORAL_TABLET | Freq: Four times a day (QID) | ORAL | Status: DC | PRN

## 2014-01-03 MED ORDER — PRO-STAT SUGAR FREE PO LIQD
30.0000 mL | Freq: Three times a day (TID) | ORAL | Status: DC
  Administered 2014-01-04 – 2014-01-10 (×16): 30 mL via ORAL
  Filled 2014-01-03 (×22): qty 30

## 2014-01-03 MED ORDER — POLYETHYLENE GLYCOL 3350 17 G PO PACK
17.0000 g | PACK | Freq: Every day | ORAL | Status: DC | PRN
  Filled 2014-01-03: qty 1

## 2014-01-03 MED ORDER — FLUTICASONE PROPIONATE 50 MCG/ACT NA SUSP
1.0000 | Freq: Every day | NASAL | Status: DC
  Administered 2014-01-04 – 2014-01-10 (×7): 1 via NASAL
  Filled 2014-01-03: qty 16

## 2014-01-03 MED ORDER — CALCIUM CITRATE 950 (200 CA) MG PO TABS
400.0000 mg | ORAL_TABLET | Freq: Every day | ORAL | Status: DC
  Administered 2014-01-05 – 2014-01-10 (×6): 400 mg via ORAL
  Filled 2014-01-03 (×8): qty 2

## 2014-01-03 MED ORDER — CHLORHEXIDINE GLUCONATE CLOTH 2 % EX PADS
6.0000 | MEDICATED_PAD | Freq: Every day | CUTANEOUS | Status: AC
  Administered 2014-01-04 – 2014-01-08 (×5): 6 via TOPICAL

## 2014-01-03 MED ORDER — DEXTROSE 5 % IV SOLN
1.0000 g | Freq: Once | INTRAVENOUS | Status: AC
Start: 2014-01-03 — End: 2014-01-03
  Administered 2014-01-03: 1 g via INTRAVENOUS
  Filled 2014-01-03: qty 10

## 2014-01-03 MED ORDER — VITAMIN D (ERGOCALCIFEROL) 1.25 MG (50000 UNIT) PO CAPS: 50000.0000 [IU] | ORAL_CAPSULE | ORAL | Status: DC

## 2014-01-03 MED ORDER — ACETAMINOPHEN 325 MG PO TABS: 650.0000 mg | ORAL_TABLET | Freq: Two times a day (BID) | ORAL | Status: DC | PRN

## 2014-01-03 MED ORDER — VITAMIN C 500 MG PO TABS
500.0000 mg | ORAL_TABLET | Freq: Two times a day (BID) | ORAL | Status: DC
  Administered 2014-01-04 – 2014-01-10 (×13): 500 mg via ORAL
  Filled 2014-01-03 (×15): qty 1

## 2014-01-03 MED ORDER — ASPIRIN-DIPYRIDAMOLE ER 25-200 MG PO CP12
1.0000 | ORAL_CAPSULE | Freq: Two times a day (BID) | ORAL | Status: DC
  Administered 2014-01-04 – 2014-01-10 (×13): 1 via ORAL
  Filled 2014-01-03 (×15): qty 1

## 2014-01-03 MED ORDER — SODIUM CHLORIDE 0.9 % IV SOLN
INTRAVENOUS | Status: DC
  Administered 2014-01-03: 16:00:00 via INTRAVENOUS

## 2014-01-03 MED ORDER — HEPARIN SODIUM (PORCINE) 5000 UNIT/ML IJ SOLN
5000.0000 [IU] | Freq: Three times a day (TID) | INTRAMUSCULAR | Status: DC
  Administered 2014-01-04 – 2014-01-10 (×19): 5000 [IU] via SUBCUTANEOUS
  Filled 2014-01-03 (×23): qty 1

## 2014-01-03 MED ORDER — VANCOMYCIN HCL IN DEXTROSE 1-5 GM/200ML-% IV SOLN
1000.0000 mg | Freq: Three times a day (TID) | INTRAVENOUS | Status: DC
  Administered 2014-01-03: 1000 mg via INTRAVENOUS
  Filled 2014-01-03 (×3): qty 200

## 2014-01-03 MED ORDER — MUPIROCIN 2 % EX OINT
1.0000 "application " | TOPICAL_OINTMENT | Freq: Two times a day (BID) | CUTANEOUS | Status: AC
  Administered 2014-01-03 – 2014-01-08 (×10): 1 via NASAL
  Filled 2014-01-03: qty 22

## 2014-01-03 MED ORDER — CETYLPYRIDINIUM CHLORIDE 0.05 % MT LIQD
7.0000 mL | Freq: Two times a day (BID) | OROMUCOSAL | Status: DC
  Administered 2014-01-04 – 2014-01-10 (×13): 7 mL via OROMUCOSAL

## 2014-01-03 MED ORDER — LATANOPROST 0.005 % OP SOLN
1.0000 [drp] | Freq: Every day | OPHTHALMIC | Status: DC
  Administered 2014-01-04 – 2014-01-09 (×7): 1 [drp] via OPHTHALMIC
  Filled 2014-01-03: qty 2.5

## 2014-01-03 MED ORDER — SODIUM CHLORIDE 0.45 % IV SOLN
INTRAVENOUS | Status: DC
  Administered 2014-01-04: via INTRAVENOUS

## 2014-01-03 MED ORDER — CHLORHEXIDINE GLUCONATE 0.12 % MT SOLN
15.0000 mL | Freq: Two times a day (BID) | OROMUCOSAL | Status: DC
  Administered 2014-01-04 – 2014-01-10 (×12): 15 mL via OROMUCOSAL
  Filled 2014-01-03 (×15): qty 15

## 2014-01-03 MED ORDER — CLONIDINE HCL 0.1 MG PO TABS
0.1000 mg | ORAL_TABLET | Freq: Two times a day (BID) | ORAL | Status: DC
  Administered 2014-01-04 – 2014-01-10 (×13): 0.1 mg via ORAL
  Filled 2014-01-03 (×15): qty 1

## 2014-01-03 MED ORDER — ENSURE COMPLETE PO LIQD
237.0000 mL | Freq: Three times a day (TID) | ORAL | Status: DC
  Administered 2014-01-04 – 2014-01-10 (×16): 237 mL via ORAL

## 2014-01-03 MED ORDER — SODIUM CHLORIDE 0.9 % IV BOLUS (SEPSIS)
1000.0000 mL | Freq: Once | INTRAVENOUS | Status: AC
  Administered 2014-01-03: 1000 mL via INTRAVENOUS

## 2014-01-03 MED ORDER — VANCOMYCIN HCL 10 G IV SOLR
1250.0000 mg | Freq: Two times a day (BID) | INTRAVENOUS | Status: DC
  Administered 2014-01-04 – 2014-01-05 (×2): 1250 mg via INTRAVENOUS
  Filled 2014-01-03 (×3): qty 1250

## 2014-01-03 MED ORDER — TRAMADOL HCL 50 MG PO TABS
50.0000 mg | ORAL_TABLET | Freq: Three times a day (TID) | ORAL | Status: DC
  Administered 2014-01-04 – 2014-01-05 (×4): 50 mg via ORAL
  Filled 2014-01-03 (×4): qty 1

## 2014-01-03 MED ORDER — DEXTROSE 5 % IV SOLN
1.0000 g | Freq: Three times a day (TID) | INTRAVENOUS | Status: DC
  Administered 2014-01-03: 1 g via INTRAVENOUS
  Filled 2014-01-03 (×2): qty 1

## 2014-01-03 NOTE — Addendum Note (Signed)
Addended by: Lupita Dawn on: 01/03/2014 03:05 PM   Modules accepted: Level of Service

## 2014-01-03 NOTE — Progress Notes (Signed)
Patient ID: [name], female   DOB: 01-Feb-1935, 79 y.o.   MRN: [id number]  Nursing Home Acute Visit  Patient seen on nursing home rounds along with Dr. Ree Kida. Pt has terminal stage 4 sacral decubitus ulcer, which has worsened over the last 48 hours. She has been febrile to >101 axillary this morning. Had a CXR overnight which showed "cardiac enlargement with calcified mitral annulus, right hilar fullness probably vascular, inflammation cannot be excluded, doubt neoplasm, and congestive heart failure suggested." Pt presently tachypneic with RR of around 38, also tachycardic. Noted by nursing staff to have new lower extremity edema to upper thighs bilaterally. Pt nonverbal at baseline.  Exam: Gen: Lying in bed, eyes open, not talking, moderately increased respiratory rate but no agonal breathing Heart: harsh blowing 3/6 systolic murmur loudest at LUSB Lungs: incr resp rate, generally clear throughout with ?mild crackles in lower lobes Ext: 3+ pitting edema to bilateral legs extending up to thighs. No calf erythema or tenderness. Sacrum: large stage 4 pressure ulcer overlying sacrum, bone palpable at base of ulcer. Sloughing present, malodorous.   Assessment/Plan: 79 yo F with likely acute CHF/volume overload, and likely sepsis secondary to terminal stage 4 pressure ulcer.  Patient previously has had MOST form completed by HCPOA [name]) which designated her as DNR/DNI, and do not hospitalize. Spoke with [facility] by phone and presented options for care at this time, explained the likely terminal nature of her current condition and its natural progression. [name] requested that patient be transferred to the emergency room for admission to the hospital, requested aggressive therapy. She did confirm that she still wishes her to be DNR/DNI. Orders entered for transfer to ER. Anticipate likely admission to stepdown unit.   Chrisandra Netters, MD Family Medicine PGY-3  Geriatrics Resident

## 2014-01-03 NOTE — Progress Notes (Signed)
ANTIBIOTIC CONSULT NOTE - INITIAL  Pharmacy Consult for Vancomycin Indication: sepsis  Allergies  Allergen Reactions  . Penicillins Other (See Comments)    Per MAR.  Pt has tolerated IV Ceftriaxone without difficulties (03/28/13 hospitalization).  . Tetanus Toxoids Other (See Comments)    Per MAR  . Vicodin [Hydrocodone-Acetaminophen] Other (See Comments)    Per [facility]    Patient Measurements:  Weight: 96.5 (03/28/13)  Vital Signs: Temp: 100.2 F (37.9 C) (10/21 1235) Temp Source: Rectal (10/21 1235) BP: 118/79 mmHg (10/21 1223) Pulse Rate: 106 (10/21 1223) Intake/Output from previous day:   Intake/Output from this shift: Total I/O In: -  Out: 40 [Urine:40]  Labs:  Recent Labs  01/03/14 1304  WBC 10.7*  HGB 10.2*  PLT 286  CREATININE 0.83   The CrCl is unknown because both a height and weight (above a minimum accepted value) are required for this calculation. No results found for this basename: VANCOTROUGH, VANCOPEAK, VANCORANDOM, GENTTROUGH, GENTPEAK, GENTRANDOM, TOBRATROUGH, TOBRAPEAK, TOBRARND, AMIKACINPEAK, AMIKACINTROU, AMIKACIN,  in the last 72 hours   Microbiology: No results found for this or any previous visit (from the past 720 hour(s)).  Medical History: Past Medical History  Diagnosis Date  . Aphasia   . Muscle weakness   . Malaise   . Fatigue   . Breast neoplasm   . Hypertension   . Glaucoma   . UTI (lower urinary tract infection)   . Vitamin D deficiency   . Hiatal hernia   . Constipation   . Diarrhea   . Asthma   . Obesity   . Hyperlipidemia   . Alzheimer's dementia   . Depression   . Allergic rhinitis   . Dementia   . DVT (deep venous thrombosis) 2014    "put her on Xeralto" (01/20/2013)  . Pneumonia 2014  . Shortness of breath     "lately; just laying there" (01/20/2013)  . Type II diabetes mellitus   . Anemia   . History of blood transfusion     "got a unit yesterday" (01/20/2013)  . Stroke 2006; 2007    residual:  "mute,  dementia, generalized weakness" (01/20/2013)  . Recurrent UTI     "at least 3 since 2008 when she went into nursing home" (01/20/2013)  . Breast cancer     "right" (01/20/2013)  . Depression   . Hospice care patient     active with [facility] - call [phone] with encounters  . C. difficile colitis 02/23/2013  . Coagulase negative Staphylococcus bacteremia 02/12/2013  . Sepsis due to Klebsiella pneumoniae 03/30/2013  . Stage IV pressure ulcer of sacral region 02/15/2012    Stage 4 10/27/13.  Followed by Wound Care team at [facility].    . H/O: CVA (cerebrovascular accident) 10/27/2013    Brain MRI 01/14/2013: Remote lacunar infarcts of the cerebellum bilaterally, right greater than left. Remote old encephalomalacia of the left parietal and occipital lobe as well as the anterior right frontal lobe.  Residual right hemiparesis  Persistent expressive aphasia  . 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 Patient presents with a large sacral ulcer to bone. Pharmacy consulted to dose Vanc for sepsis.  10/21 >> Rocephin x1 in ED 10/21 >>Vanc >>   Tmax: 100.2 WBCs: 10.7 Renal: SCr 0.83 CrCl  63ml/min  10/21 blood x2: sent  Goal of Therapy:  Vancomycin trough level 15-20 mcg/ml  Plan:  Start Vancomycin 1g IV Q8H Measure antibiotic drug levels at steady state Follow up culture results  Kizzie Furnish, PharmD Pager: [phone] 01/03/2014 2:09 PM

## 2014-01-03 NOTE — Progress Notes (Signed)
Patient admitted to [facility], alert and orientation unable to assess, transferred on hospital bed by ED staff, Nurse and NT, patient tolerated well, BP=133/71, P=91, Temp=98.3 orally, Sats= 99 % on RA, patient is non-verbal, only follow with eyes, foley cath draining clear yellow urine, incont of stool, stage IV terminal ulcer at sacrum bone is visible with necrotic tissue surrounding wound, yellowish and whitish tissue in bed of wound, Dr notified of patient arrival to unit, patient in fair condition at this time, received call from Dr Clementeen Graham to transfer patient to [facility], Care link to transfer patient to Medical Unit with Dr Lindell Noe with Family Medicine, report given to Primary Nurse receiving patient at [facility], patient in fair condition at time of transfer

## 2014-01-03 NOTE — ED Notes (Signed)
Attempted x2 to give report to [address] [place]. Requested to speak to charge nurse, yet she was unavailable.

## 2014-01-03 NOTE — ED Notes (Signed)
Bed: WA14 Expected date:  Expected time:  Means of arrival:  Comments: EMS 

## 2014-01-03 NOTE — Progress Notes (Signed)
ANTIBIOTIC CONSULT NOTE - INITIAL  Pharmacy Consult for Adding Cefepime Indication: osteomyelitis  Allergies  Allergen Reactions  . Penicillins Other (See Comments)    Per MAR.  Pt has tolerated IV Ceftriaxone without difficulties (03/28/13 hospitalization).  . Tetanus Toxoids Other (See Comments)    Per MAR  . Vicodin [Hydrocodone-Acetaminophen] Other (See Comments)    Per [facility]    Patient Measurements: Weight: 210 lb 1.6 oz (95.3 kg) Height 175 cm Adjusted Body Weight: 77.7 kg  Vital Signs: Temp: 98.3 F (36.8 C) (10/21 2026) Temp Source: Oral (10/21 2026) BP: 141/86 mmHg (10/21 2026) Pulse Rate: 91 (10/21 2026) Intake/Output from previous day:   Intake/Output from this shift:    Labs:  Recent Labs  01/03/14 1304  WBC 10.7*  HGB 10.2*  PLT 286  CREATININE 0.83   The CrCl is unknown because both a height and weight (above a minimum accepted value) are required for this calculation. No results found for this basename: VANCOTROUGH, VANCOPEAK, VANCORANDOM, GENTTROUGH, GENTPEAK, GENTRANDOM, TOBRATROUGH, TOBRAPEAK, TOBRARND, AMIKACINPEAK, AMIKACINTROU, AMIKACIN,  in the last 72 hours   Microbiology: No results found for this or any previous visit (from the past 720 hour(s)).  Medical History: Past Medical History  Diagnosis Date  . Aphasia   . Muscle weakness   . Malaise   . Fatigue   . Breast neoplasm   . Hypertension   . Glaucoma   . UTI (lower urinary tract infection)   . Vitamin D deficiency   . Hiatal hernia   . Constipation   . Diarrhea   . Asthma   . Obesity   . Hyperlipidemia   . Alzheimer's dementia   . Depression   . Allergic rhinitis   . Dementia   . DVT (deep venous thrombosis) 2014    "put her on Xeralto" (01/20/2013)  . Pneumonia 2014  . Shortness of breath     "lately; just laying there" (01/20/2013)  . Type II diabetes mellitus   . Anemia   . History of blood transfusion     "got a unit yesterday" (01/20/2013)  . Stroke 2006; 2007     residual:  "mute, dementia, generalized weakness" (01/20/2013)  . Recurrent UTI     "at least 3 since 2008 when she went into nursing home" (01/20/2013)  . Breast cancer     "right" (01/20/2013)  . Depression   . Hospice care patient     active with [facility] - call [phone] with encounters  . C. difficile colitis 02/23/2013  . Coagulase negative Staphylococcus bacteremia 02/12/2013  . Sepsis due to Klebsiella pneumoniae 03/30/2013  . Stage IV pressure ulcer of sacral region 02/15/2012    Stage 4 10/27/13.  Followed by Wound Care team at [facility].    . H/O: CVA (cerebrovascular accident) 10/27/2013    Brain MRI 01/14/2013: Remote lacunar infarcts of the cerebellum bilaterally, right greater than left. Remote old encephalomalacia of the left parietal and occipital lobe as well as the anterior right frontal lobe.  Residual right hemiparesis  Persistent expressive aphasia  . 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 SCr 0.83. Estimated CrCl ~ 65-58mL/min.  WBC elevated at 10.7. Tmax 100.2.   Goal of Therapy:  Vancomycin trough level 15-20 mcg/ml Clinical resolution of infection.   Plan:  1. Cefepime 1g IV q8h. 2. Decrease vancomycin to 1250 mg IV q12h.  3. Monitor renal function and adjust therapy as needed.   Sloan Leiter, PharmD, BCPS Clinical  Pharmacist [phone] 01/03/2014,9:43 PM

## 2014-01-03 NOTE — Progress Notes (Signed)
UR completed 

## 2014-01-03 NOTE — Progress Notes (Signed)
Called sister, [name], who is the patient's HCPOA. She is frustrated because she "[does] not know how things got this far" with regards to the sacral decubitus ulcer. She is frustrated that the patient was not frequently turned to prevent this. Frustrated that she should have turned. Discussed patient's current status. Ms. Chana Bode would like full treatment for patient. Confirmed DNR/DNI. Will try to come to the hospital in the morning to see patient.

## 2014-01-03 NOTE — Progress Notes (Signed)
Patient ID: [name], female   DOB: October 20, 1934, 79 y.o.   MRN: [id number]  Patient seen and examined with Dr. Ardelia Mems. Agree with assessment and plan. 79 y/o female currently on Hospice with PNA vs CHF exacerbation and necrotic Stage IV sacral ulcer. After discussion with sister it was decided to send patient to ER for further evaluation and possible admission to the hospital.  Dossie Arbour MD

## 2014-01-03 NOTE — ED Provider Notes (Signed)
CSN: [id number]     Arrival date & time 01/03/14  1235 History   First MD Initiated Contact with Patient 01/03/14 1236     Chief Complaint  Patient presents with  . Fever  . Tachycardia     (Consider location/radiation/quality/duration/timing/severity/associated sxs/prior Treatment) HPI  79yF sent in for evaluation of fever and tachycardia.  Reported fever. Exact symptom onset unclear.Pt aphasic from prior stroke. Has foley. Sacral decub. Coming from [place]. EMS reports occasional cough in route.   Past Medical History  Diagnosis Date  . Aphasia   . Muscle weakness   . Malaise   . Fatigue   . Breast neoplasm   . Hypertension   . Glaucoma   . UTI (lower urinary tract infection)   . Vitamin D deficiency   . Hiatal hernia   . Constipation   . Diarrhea   . Asthma   . Obesity   . Hyperlipidemia   . Alzheimer's dementia   . Depression   . Allergic rhinitis   . Dementia   . DVT (deep venous thrombosis) 2014    "put her on Xeralto" (01/20/2013)  . Pneumonia 2014  . Shortness of breath     "lately; just laying there" (01/20/2013)  . Type II diabetes mellitus   . Anemia   . History of blood transfusion     "got a unit yesterday" (01/20/2013)  . Stroke 2006; 2007    residual:  "mute, dementia, generalized weakness" (01/20/2013)  . Recurrent UTI     "at least 3 since 2008 when she went into nursing home" (01/20/2013)  . Breast cancer     "right" (01/20/2013)  . Depression   . Hospice care patient     active with [facility] - call [phone] with encounters  . C. difficile colitis 02/23/2013  . Coagulase negative Staphylococcus bacteremia 02/12/2013  . Sepsis due to Klebsiella pneumoniae 03/30/2013  . Stage IV pressure ulcer of sacral region 02/15/2012    Stage 4 10/27/13.  Followed by Wound Care team at [facility].    . H/O: CVA (cerebrovascular accident) 10/27/2013    Brain MRI 01/14/2013: Remote lacunar infarcts of the cerebellum bilaterally, right greater than left. Remote old  encephalomalacia of the left parietal and occipital lobe as well as the anterior right frontal lobe.  Residual right hemiparesis  Persistent expressive aphasia  . 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 Joint replacement      left knee in '90s  . Breast lumpectomy Right 1990's; 2000's    "she's had 2" (01/20/2013)  . Replacement total knee bilateral Bilateral 1990's  . Tee without cardioversion N/A 02/17/2013    Procedure: TRANSESOPHAGEAL ECHOCARDIOGRAM (TEE);  Surgeon: Dorothy Spark, MD;  Location: [facility] ENDOSCOPY;  Service: Cardiovascular;  Laterality: N/A;   No family history on file. History  Substance Use Topics  . Smoking status: Never Smoker   . Smokeless tobacco: Never Used  . Alcohol Use: No   OB History   Grav Para Term Preterm Abortions TAB SAB Ect Mult Living                 Review of Systems  Level 5 caveat because pt is nonverbal.   Allergies  Penicillins; Tetanus toxoids; and Vicodin  Home Medications   Prior to Admission medications   Medication Sig Start Date End Date  Taking? Authorizing Provider  acetaminophen (TYLENOL) 325 MG tablet Take 650 mg by mouth 2 (two) times daily as needed for mild pain.    Historical Provider, MD  acetaminophen (TYLENOL) 325 MG tablet Take 325 mg by mouth 3 (three) times daily.    Historical Provider, MD  Amino Acids-Protein Hydrolys (FEEDING SUPPLEMENT, PRO-STAT SUGAR FREE 64,) LIQD Take 30 mLs by mouth 3 (three) times daily with meals.    Historical Provider, MD  calcium citrate (CALCITRATE - DOSED IN MG ELEMENTAL CALCIUM) 950 MG tablet Take 400 mg of elemental calcium by mouth daily.     Historical Provider, MD  cloNIDine (CATAPRES) 0.1 MG tablet Take 0.1 mg by mouth 2 (two) times daily.    Historical Provider, MD  dipyridamole-aspirin (AGGRENOX) 200-25 MG per 12 hr capsule Take  1 capsule by mouth 2 (two) times daily.    Historical Provider, MD  ENSURE (ENSURE) Take 1 Can by mouth daily.    Historical Provider, MD  ergocalciferol (VITAMIN D2) 50000 UNITS capsule Take 50,000 Units by mouth every 30 (thirty) days. On the 15th of every month    Historical Provider, MD  fluticasone (FLONASE) 50 MCG/ACT nasal spray Place 1 spray into both nostrils daily.    Historical Provider, MD  ondansetron (ZOFRAN) 4 MG tablet Take 4 mg by mouth every 6 (six) hours as needed for nausea or vomiting.    Historical Provider, MD  polyethylene glycol powder (GLYCOLAX/MIRALAX) powder Take 17 g by mouth daily as needed for mild constipation. FOR CONSTIPATION 02/22/13   Bernadene Bell, MD  traMADol (ULTRAM) 50 MG tablet Take 1 tablet (50 mg total) by mouth every 8 (eight) hours. 10/30/13   Blane Ohara McDiarmid, MD  Travoprost, BAK Free, (TRAVATAN) 0.004 % SOLN ophthalmic solution Place 1 drop into both eyes at bedtime. 02/22/13   Bernadene Bell, MD  UNABLE TO FIND Med Name: Oxygen supplement 2 left/min per [place] as needed SaO2 < 92%    Historical Provider, MD  vitamin C (ASCORBIC ACID) 500 MG tablet Take 500 mg by mouth 2 (two) times daily.     Historical Provider, MD   BP 118/79  Pulse 106  Temp(Src) 100.2 F (37.9 C) (Rectal)  SpO2 95% Physical Exam  Nursing note and vitals reviewed. Constitutional: She appears distressed.  HENT:  Head: Normocephalic and atraumatic.  Eyes: Conjunctivae are normal. Right eye exhibits no discharge. Left eye exhibits no discharge.  Neck: Neck supple.  Cardiovascular: Regular rhythm and normal heart sounds.  Exam reveals no gallop and no friction rub.   No murmur heard. tachycardic  Pulmonary/Chest: Effort normal and breath sounds normal. No respiratory distress.  Abdominal: Soft. She exhibits no distension. There is no tenderness.  Genitourinary:  Large sacral decubitus ulcer with sacrum visible. Slough and mild amount of purulent drainage around wound  margins.Foul odor.  Musculoskeletal: She exhibits no edema and no tenderness.  Neurological:  Eyes open. Appears to be trying to mouth words, but I cannot understand what she may be trying to say.   Skin: Skin is warm and dry.    ED Course  Procedures (including critical care time) Labs Review Labs Reviewed  MRSA PCR SCREENING - Abnormal; Notable for the following:    MRSA by PCR POSITIVE (*)    All other components within normal limits  CBC WITH DIFFERENTIAL - Abnormal; Notable for the following:    WBC 10.7 (*)    Hemoglobin 10.2 (*)    HCT 33.7 (*)  MCH 24.0 (*)    RDW 17.7 (*)    Neutro Abs 8.2 (*)    All other components within normal limits  BASIC METABOLIC PANEL - Abnormal; Notable for the following:    Sodium 160 (*)    Chloride 122 (*)    Glucose, Bld 210 (*)    BUN 53 (*)    GFR calc non Af Amer 66 (*)    GFR calc Af Amer 77 (*)    All other components within normal limits  URINALYSIS, ROUTINE W REFLEX MICROSCOPIC - Abnormal; Notable for the following:    Color, Urine AMBER (*)    APPearance TURBID (*)    Hgb urine dipstick TRACE (*)    Protein, ur 100 (*)    Leukocytes, UA LARGE (*)    All other components within normal limits  URINE MICROSCOPIC-ADD ON - Abnormal; Notable for the following:    Casts HYALINE CASTS (*)    All other components within normal limits  BASIC METABOLIC PANEL - Abnormal; Notable for the following:    Sodium 155 (*)    Chloride 121 (*)    Glucose, Bld 168 (*)    BUN 44 (*)    GFR calc non Af Amer 82 (*)    All other components within normal limits  CBC - Abnormal; Notable for the following:    Hemoglobin 9.2 (*)    HCT 30.5 (*)    MCV 77.8 (*)    MCH 23.5 (*)    RDW 18.0 (*)    All other components within normal limits  BASIC METABOLIC PANEL - Abnormal; Notable for the following:    Sodium 152 (*)    Chloride 116 (*)    Glucose, Bld 189 (*)    BUN 46 (*)    GFR calc non Af Amer 81 (*)    All other components within  normal limits  BASIC METABOLIC PANEL - Abnormal; Notable for the following:    Sodium 154 (*)    Chloride 119 (*)    Glucose, Bld 177 (*)    BUN 49 (*)    GFR calc non Af Amer 78 (*)    All other components within normal limits  BASIC METABOLIC PANEL - Abnormal; Notable for the following:    Sodium 152 (*)    Potassium 6.0 (*)    Chloride 119 (*)    Glucose, Bld 177 (*)    BUN 50 (*)    GFR calc non Af Amer 82 (*)    All other components within normal limits  C-REACTIVE PROTEIN - Abnormal; Notable for the following:    CRP 17.0 (*)    All other components within normal limits  SEDIMENTATION RATE - Abnormal; Notable for the following:    Sed Rate 106 (*)    All other components within normal limits  BASIC METABOLIC PANEL - Abnormal; Notable for the following:    Sodium 155 (*)    Chloride 119 (*)    Glucose, Bld 189 (*)    BUN 47 (*)    GFR calc non Af Amer 80 (*)    All other components within normal limits  BASIC METABOLIC PANEL - Abnormal; Notable for the following:    Sodium 155 (*)    Chloride 121 (*)    Glucose, Bld 267 (*)    BUN 49 (*)    GFR calc non Af Amer 79 (*)    All other components within normal limits  COMPREHENSIVE METABOLIC  PANEL - Abnormal; Notable for the following:    Sodium 151 (*)    Chloride 117 (*)    Glucose, Bld 183 (*)    BUN 39 (*)    Albumin 2.1 (*)    ALT 68 (*)    Total Bilirubin <0.2 (*)    GFR calc non Af Amer 83 (*)    All other components within normal limits  BASIC METABOLIC PANEL - Abnormal; Notable for the following:    Chloride 113 (*)    Glucose, Bld 267 (*)    BUN 33 (*)    GFR calc non Af Amer 82 (*)    All other components within normal limits  CULTURE, BLOOD (ROUTINE X 2)  CULTURE, BLOOD (ROUTINE X 2)  LACTIC ACID, PLASMA    Imaging Review No results found.  Dg Chest Portable 1 View  01/03/2014   CLINICAL DATA:  Fever and tachycardia  EXAM: PORTABLE CHEST - 1 VIEW  COMPARISON:  03/25/2013  FINDINGS: Cardiac  enlargement. Pulmonary artery enlargement, stable. Negative for heart failure. Extensive mitral annular calcification.  Negative for pneumonia or effusion.  IMPRESSION: No active disease.   Electronically Signed   By: Franchot Gallo M.D.   On: 01/03/2014 14:26    EKG Interpretation None      MDM   Final diagnoses:  Sepsis, due to unspecified organism  Infected decubitus ulcer, stage IV  Urinary tract infection associated with catheterization of urinary tract, initial encounter  Dehydration    79yF with sepsis. Large sacral ulcer to bone. Strong odor. Pt DNR/DNI. Per review of notes, family requesting hospitalization if medical indication. Will start abx Will change foley. Check UA. CXR. Blood cultures. Lactic acid. BMP/CBC.  Leukocytosis. Hypernatremia/Elevated BUN. IVF. Will discuss with FP.     Virgel Manifold, MD 01/08/14 [phone]

## 2014-01-03 NOTE — ED Notes (Signed)
Attempted to call report to [place].

## 2014-01-03 NOTE — H&P (Signed)
[facility] Admission History and Physical Service Pager: [phone]  Patient name: [name] Medical record number: [id number] Date of birth: [birth year] Age: 79 y.o. Gender: female  Primary Care Provider: Vance Gather, MD Consultants: None Code Status: DNR/DNI  Chief Complaint: Sepsis  Assessment and Plan: [name] is a 79 y.o. female presenting with sepsis . PMH is significant for dementia, CVA, stage 4 decubitus ulcer, hypertension, vitamin D deficiency  # Sepsis: secondary to sacral decubitus ulcer. Patient currently only with mild tachycardia and tachypnea. Blood pressure stable. Patient s/p ceftriaxone x1 in the ED and currently on vancomycin. Spoke with sister and she would like treatment and consult to orthopedic surgery - Admit to inpatient, telemetry, Dr. Lindell Noe - MRI to assess for osteomyelitis - SLP eval - Continue vancomycin; add cefepime - Ortho consult in AM - Consider palliative care consult in AM  # Stage 4 decubitus ulcer - wound consult - continue home tramadol 50mg  TID - management as above  # Hypertension - continue clonidine  # CAD - continue aggrenox  # Vitamin D deficiency - continue calcium and vitamin D  FEN/GI: NPO until bedside swallow, continue supplements (after assessment) Prophylaxis: heparin subq, miralax, zofran for nausea  Disposition: Admit to inpatient, telemetry, Dr. Lindell Noe  History of Present Illness: [name] is a 79 y.o. female presenting with sepsis. Patient has history of dementia and is non-verbal. Level 5 caveat applies.  From chart review, patient has a stage 4 decubitus ulcer. She is currently receiving hospice services. Patient found to have fever of 100.4. Patient evaluated by MD who started cipro and clindamycin for wound infection and fever. Speaking with MD, patient also tachypneic to 38 with an axillary temp of 101. Patient [facility] ED and was given vancomycin and ceftriaxone.  Review  Of Systems: Per HPI with the following additions: None Otherwise 12 point review of systems was performed and was unremarkable.  Patient Active Problem List   Diagnosis Date Noted  . Sepsis 01/03/2014  . T2DM (type 2 diabetes mellitus) 12/19/2013  . Bipolar disorder 12/19/2013  . H/O: CVA (cerebrovascular accident) 10/27/2013  . Bleeding from wound 10/27/2013  . Hospice care patient 02/22/2013  . Closed fracture of right femur 01/24/2013  . DVT (deep venous thrombosis) 12/16/2012  . Anemia 05/25/2012  . Mitral stenosis 02/15/2012  . Stage IV pressure ulcer of sacral region 02/15/2012  . Hypertension 12/27/2011  . Aortic stenosis 12/11/2011  . CKD (chronic kidney disease) stage 2, GFR 60-89 ml/min 12/11/2011  . Glaucoma   . Vitamin D deficiency   . Hiatal hernia   . Hyperlipidemia   . Vascular dementia   . Allergic rhinitis    Past Medical History: Past Medical History  Diagnosis Date  . Aphasia   . Muscle weakness   . Malaise   . Fatigue   . Breast neoplasm   . Hypertension   . Glaucoma   . UTI (lower urinary tract infection)   . Vitamin D deficiency   . Hiatal hernia   . Constipation   . Diarrhea   . Asthma   . Obesity   . Hyperlipidemia   . Alzheimer's dementia   . Depression   . Allergic rhinitis   . Dementia   . DVT (deep venous thrombosis) 2014    "put her on Xeralto" (01/20/2013)  . Pneumonia 2014  . Shortness of breath     "lately; just laying there" (01/20/2013)  . Type II  diabetes mellitus   . Anemia   . History of blood transfusion     "got a unit yesterday" (01/20/2013)  . Stroke 2006; 2007    residual:  "mute, dementia, generalized weakness" (01/20/2013)  . Recurrent UTI     "at least 3 since 2008 when she went into nursing home" (01/20/2013)  . Breast cancer     "right" (01/20/2013)  . Depression   . Hospice care patient     active with [facility] - call [phone] with encounters  . C. difficile colitis 02/23/2013  . Coagulase negative Staphylococcus  bacteremia 02/12/2013  . Sepsis due to Klebsiella pneumoniae 03/30/2013  . Stage IV pressure ulcer of sacral region 02/15/2012    Stage 4 10/27/13.  Followed by Wound Care team at [facility]    . H/O: CVA (cerebrovascular accident) 10/27/2013    Brain MRI 01/14/2013: Remote lacunar infarcts of the cerebellum bilaterally, right greater than left. Remote old encephalomalacia of the left parietal and occipital lobe as well as the anterior right frontal lobe.  Residual right hemiparesis  Persistent expressive aphasia  . 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 Seromas by mammo and [place] Feb 2014    Past Surgical History: Past Surgical History  Procedure Laterality Date  . Joint replacement      left knee in '90s  . Breast lumpectomy Right 1990's; 2000's    "she's had 2" (01/20/2013)  . Replacement total knee bilateral Bilateral 1990's  . Tee without cardioversion N/A 02/17/2013    Procedure: TRANSESOPHAGEAL ECHOCARDIOGRAM (TEE);  Surgeon: Dorothy Spark, MD;  Location: [facility] ENDOSCOPY;  Service: Cardiovascular;  Laterality: N/A;   Social History: History  Substance Use Topics  . Smoking status: Never Smoker   . Smokeless tobacco: Never Used  . Alcohol Use: No   Additional social history: None  Please also refer to relevant sections of EMR.  Family History: History reviewed. No pertinent family history. Allergies and Medications: Allergies  Allergen Reactions  . Penicillins Other (See Comments)    Per MAR.  Pt has tolerated IV Ceftriaxone without difficulties (03/28/13 hospitalization).  . Tetanus Toxoids Other (See Comments)    Per MAR  . Vicodin [Hydrocodone-Acetaminophen] Other (See Comments)    Per MAR   No current facility-administered medications on file prior to encounter.   Current Outpatient Prescriptions on File Prior to Encounter  Medication Sig Dispense Refill  . acetaminophen (TYLENOL) 325 MG tablet Take 325  mg by mouth 3 (three) times daily.      . Amino Acids-Protein Hydrolys (FEEDING SUPPLEMENT, PRO-STAT SUGAR FREE 64,) LIQD Take 30 mLs by mouth 3 (three) times daily with meals.      . calcium citrate (CALCITRATE - DOSED IN MG ELEMENTAL CALCIUM) 950 MG tablet Take 400 mg of elemental calcium by mouth daily. Take 2 Tablets By Mouth Daily.      . cloNIDine (CATAPRES) 0.1 MG tablet Take 0.1 mg by mouth 2 (two) times daily.      Marland Kitchen dipyridamole-aspirin (AGGRENOX) 200-25 MG per 12 hr capsule Take 1 capsule by mouth 2 (two) times daily.      Marland Kitchen ENSURE (ENSURE) Take 237 mLs by mouth 3 (three) times daily.       . ergocalciferol (VITAMIN D2) 50000 UNITS capsule Take 50,000 Units by mouth every 30 (thirty) days. On the 15th of every month      . fluticasone (FLONASE) 50 MCG/ACT nasal spray Place 1 spray into both  nostrils daily.      . Travoprost, BAK Free, (TRAVATAN) 0.004 % SOLN ophthalmic solution Place 1 drop into both eyes at bedtime.      . vitamin C (ASCORBIC ACID) 500 MG tablet Take 500 mg by mouth 2 (two) times daily.       Marland Kitchen acetaminophen (TYLENOL) 325 MG tablet Take 650 mg by mouth 2 (two) times daily as needed for mild pain.      Marland Kitchen ondansetron (ZOFRAN) 4 MG tablet Take 4 mg by mouth every 6 (six) hours as needed for nausea or vomiting.      . polyethylene glycol powder (GLYCOLAX/MIRALAX) powder Take 17 g by mouth daily as needed for mild constipation. FOR CONSTIPATION      . UNABLE TO FIND Med Name: Oxygen supplement 2 left/min per [place] as needed SaO2 < 92%        Objective: BP 141/86  Pulse 91  Temp(Src) 98.3 F (36.8 C) (Oral)  Resp 20  Ht 5' 8.9" (1.75 m)  Wt 210 lb 1.6 oz (95.3 kg)  BMI 31.12 kg/m2  SpO2 99% Exam: General: Laying in bed, non-verbal, does not appear in distress Cardiovascular: Regular rate and rhythm, 3/6 crescendo-decrescendo murmur heard best at LUSB and holosystolic blowing murmur best heard at apex Respiratory: Anterior auscultation. Clear to auscultation  bilaterally Abdomen: Soft, non-tender, non-distended Extremities: no edema Skin: large wound at sacral location with macerated skin at periphery. Entire wound is about 7cm x 7cm. Bone was probed. Foul odor present Neuro: Alert, responds to some answers and commands.      Labs and Imaging: CBC BMET   Recent Labs Lab 01/03/14 1304  WBC 10.7*  HGB 10.2*  HCT 33.7*  PLT 286    Recent Labs Lab 01/03/14 1304  NA 160*  K 4.6  CL 122*  CO2 24  BUN 53*  CREATININE 0.83  GLUCOSE 210*  CALCIUM 9.3     Urinalysis    Component Value Date/Time   COLORURINE AMBER* 01/03/2014 1400   APPEARANCEUR TURBID* 01/03/2014 1400   LABSPEC 1.027 01/03/2014 1400   PHURINE 5.5 01/03/2014 1400   GLUCOSEU NEGATIVE 01/03/2014 1400   HGBUR TRACE* 01/03/2014 1400   BILIRUBINUR NEGATIVE 01/03/2014 1400   KETONESUR NEGATIVE 01/03/2014 1400   PROTEINUR 100* 01/03/2014 1400   UROBILINOGEN 1.0 01/03/2014 1400   NITRITE NEGATIVE 01/03/2014 1400   LEUKOCYTESUR LARGE* 01/03/2014 1400    Cordelia Poche, MD 01/03/2014, 11:14 PM PGY-2, [place] [facility] pager: [phone], text pages welcome

## 2014-01-03 NOTE — ED Notes (Signed)
Per EMS pt coming from [place] where they were called out for tachycardia.  Pt is mute from previous stroke.  Pt had fever earlier and given tylenol at 1035 and reduced it down to 98.8.  Per EMS pt has PNA like symptoms. Pt has bed sore and foley catheter prior to triage.

## 2014-01-04 ENCOUNTER — Encounter (HOSPITAL_COMMUNITY): Payer: Self-pay | Admitting: General Surgery

## 2014-01-04 LAB — BASIC METABOLIC PANEL
ANION GAP: 13 (ref 5–15)
ANION GAP: 15 (ref 5–15)
BUN: 44 mg/dL — ABNORMAL HIGH (ref 6–23)
BUN: 46 mg/dL — ABNORMAL HIGH (ref 6–23)
CO2: 21 mEq/L (ref 19–32)
CO2: 21 meq/L (ref 19–32)
Calcium: 9.3 mg/dL (ref 8.4–10.5)
Calcium: 9.4 mg/dL (ref 8.4–10.5)
Chloride: 121 mEq/L — ABNORMAL HIGH (ref 96–112)
Chloride: 116 mEq/L — ABNORMAL HIGH (ref 96–112)
Creatinine, Ser: 0.68 mg/dL (ref 0.50–1.10)
Creatinine, Ser: 0.7 mg/dL (ref 0.50–1.10)
GFR calc Af Amer: >90 mL/min (ref >90)
GFR calc non Af Amer: 82 mL/min — ABNORMAL LOW (ref >90)
GFR calc non Af Amer: 81 mL/min — ABNORMAL LOW (ref >90)
GLUCOSE: 189 mg/dL — AB (ref 70–99)
Glucose, Bld: 168 mg/dL — ABNORMAL HIGH (ref 70–99)
POTASSIUM: 4.2 meq/L (ref 3.7–5.3)
Potassium: 4.1 mEq/L (ref 3.7–5.3)
SODIUM: 155 meq/L — AB (ref 137–147)
SODIUM: 152 meq/L — AB (ref 137–147)

## 2014-01-04 LAB — CBC
HCT: 30.5 % — ABNORMAL LOW (ref 36.0–46.0)
Hemoglobin: 9.2 g/dL — ABNORMAL LOW (ref 12.0–15.0)
MCH: 23.5 pg — ABNORMAL LOW (ref 26.0–34.0)
MCHC: 30.2 g/dL (ref 30.0–36.0)
MCV: 77.8 fL — ABNORMAL LOW (ref 78.0–100.0)
Platelets: 230 10*3/uL (ref 150–400)
RBC: 3.92 MIL/uL (ref 3.87–5.11)
RDW: 18 % — ABNORMAL HIGH (ref 11.5–15.5)
WBC: 9.4 10*3/uL (ref 4.0–10.5)

## 2014-01-04 MED ORDER — COLLAGENASE 250 UNIT/GM EX OINT
TOPICAL_OINTMENT | Freq: Every day | CUTANEOUS | Status: DC
  Administered 2014-01-04 – 2014-01-10 (×7): via TOPICAL
  Filled 2014-01-04: qty 30

## 2014-01-04 MED ORDER — MORPHINE SULFATE 2 MG/ML IJ SOLN
2.0000 mg | INTRAMUSCULAR | Status: DC | PRN
  Administered 2014-01-04 – 2014-01-05 (×3): 2 mg via INTRAVENOUS
  Filled 2014-01-04 (×3): qty 1

## 2014-01-04 MED ORDER — SODIUM CHLORIDE 0.9 % IV SOLN
500.0000 mg | Freq: Three times a day (TID) | INTRAVENOUS | Status: DC
  Administered 2014-01-04 – 2014-01-05 (×3): 500 mg via INTRAVENOUS
  Filled 2014-01-04 (×5): qty 500

## 2014-01-04 MED ORDER — SODIUM CHLORIDE 0.45 % IV SOLN
INTRAVENOUS | Status: DC
  Administered 2014-01-04 – 2014-01-05 (×2): via INTRAVENOUS

## 2014-01-04 MED ORDER — ADULT MULTIVITAMIN LIQUID CH
5.0000 mL | Freq: Every day | ORAL | Status: DC
  Administered 2014-01-04 – 2014-01-10 (×7): 5 mL via ORAL
  Filled 2014-01-04 (×7): qty 5

## 2014-01-04 NOTE — Consult Note (Signed)
Requesting MD: Dalbert Mayotte     Subjective: We have been asked to evaluate this patient for her sacral decubitus ulcer.  According to her sister, she has likely had this for 3-4 months.  She began running a fever of 100.4 at the nursing home and was brought to [facility].  She was admitted.  She is a hospice patient, of note.  We have been asked to evaluate her sacral wound.  Objective: Vital signs in last 24 hours: Temp:  [97.7 F (36.5 C)-98.4 F (36.9 C)] 98.1 F (36.7 C) (10/22 0517) Pulse Rate:  [91-98] 91 (10/22 1119) Resp:  [18-20] 20 (10/22 0517) BP: (133-170)/(71-107) 137/100 mmHg (10/22 1119) SpO2:  [96 %-100 %] 99 % (10/22 0517) Weight:  [210 lb 1.6 oz (95.3 kg)-212 lb 11.9 oz (96.5 kg)] 210 lb 1.6 oz (95.3 kg) (10/21 2026) Last BM Date:  (PTA)  Intake/Output from previous day: 10/21 0701 - 10/22 0700 In: 1011.5 [P.O.:444; I.V.:517.5; IV Piggyback:50] Out: 46 [Urine:40] Intake/Output this shift: Total I/O In: 0  Out: 800 [Urine:800]  PE: Skin: stage 4 sacral decubitus ulcer.  Per WOC note her measurements are  9x8x3cm with undermining from 1-3 o'clock.  She also has a stage 2 ulcer just right lateral to this that is 2x3x0.2cm that does have a small eschar but some surrounding pink tissue noted.  The large stage 4 wound does have exposed sacrum.  The wound itself is not infected.  By definition, exposed bone is osteomyelitis, so she does have an infection of the sacrum. Lab Results:   Recent Labs  01/03/14 1304 01/04/14 0615  WBC 10.7* 9.4  HGB 10.2* 9.2*  HCT 33.7* 30.5*  PLT 286 230   BMET  Recent Labs  01/03/14 1304 01/04/14 0615  NA 160* 155*  K 4.6 4.2  CL 122* 121*  CO2 24 21  GLUCOSE 210* 168*  BUN 53* 44*  CREATININE 0.83 0.68  CALCIUM 9.3 9.3   PT/INR No results found for this basename: LABPROT, INR,  in the last 72 hours CMP     Component Value Date/Time   NA 155* 01/04/2014 0615   NA 141 04/25/2013   K 4.2 01/04/2014 0615   K 4.0 01/25/2012  1003   CL 121* 01/04/2014 0615   CL 111 01/25/2012 1003   CO2 21 01/04/2014 0615   CO2 25 01/25/2012 1003   GLUCOSE 168* 01/04/2014 0615   BUN 44* 01/04/2014 0615   BUN 22* 04/25/2013   BUN 30 01/13/2012 1627   CREATININE 0.68 01/04/2014 0615   CREATININE 0.9 04/25/2013   CREATININE 0.88 01/25/2012 1003   CALCIUM 9.3 01/04/2014 0615   CALCIUM 8.4 01/25/2012 1003   PROT 6.5 03/25/2013 1213   ALBUMIN 2.7* 03/25/2013 1213   AST 8* 04/25/2013   ALT 11 04/25/2013   ALKPHOS 55 04/25/2013   BILITOT 0.2* 03/25/2013 1213   GFRNONAA 82* 01/04/2014 0615   GFRAA >90 01/04/2014 0615   Lipase  No results found for this basename: lipase       Studies/Results: Dg Chest Portable 1 View  01/03/2014   CLINICAL DATA:  Fever and tachycardia  EXAM: PORTABLE CHEST - 1 VIEW  COMPARISON:  03/25/2013  FINDINGS: Cardiac enlargement. Pulmonary artery enlargement, stable. Negative for heart failure. Extensive mitral annular calcification.  Negative for pneumonia or effusion.  IMPRESSION: No active disease.   Electronically Signed   By: Franchot Gallo M.D.   On: 01/03/2014 14:26    Anti-infectives: Anti-infectives   Start  Dose/Rate Route Frequency Ordered Stop   01/04/14 2200  vancomycin (VANCOCIN) 1,250 mg in sodium chloride 0.9 % 250 mL IVPB     1,250 mg 166.7 mL/hr over 90 Minutes Intravenous Every 12 hours 01/03/14 2155     01/04/14 1330  imipenem-cilastatin (PRIMAXIN) 500 mg in sodium chloride 0.9 % 100 mL IVPB     500 mg 200 mL/hr over 30 Minutes Intravenous 3 times per day 01/04/14 1223     01/03/14 2200  ceFEPIme (MAXIPIME) 1 g in dextrose 5 % 50 mL IVPB  Status:  Discontinued     1 g 100 mL/hr over 30 Minutes Intravenous 3 times per day 01/03/14 2154 01/03/14 2326   01/03/14 1415  vancomycin (VANCOCIN) IVPB 1000 mg/200 mL premix  Status:  Discontinued     1,000 mg 200 mL/hr over 60 Minutes Intravenous Every 8 hours 01/03/14 1407 01/03/14 2155   01/03/14 1315  cefTRIAXone (ROCEPHIN) 1 g in  dextrose 5 % 50 mL IVPB     1 g 100 mL/hr over 30 Minutes Intravenous  Once 01/03/14 1310 01/03/14 1357       Assessment/Plan  1. Stage 4 sacral decubitus ulcer with osteomyelitis due to exposed sacrum 2. Smaller stage 2 right buttock pressure ulcer 3. Multiple medical problems  Plan: 1. The stage 4 wound is mostly clean, but there is some necrotic tissue in the undermined area of the wound and at the superior most portion.  This appears shallow in nature and I would not sharply debride this.  The smaller wound has a small tight eschar.  I think both wounds, if fairly aggressive care is wanted (which the sister said yes), would benefit from some hydrotherapy and santyl with BID NS WD dressing changes.  MRI of pelvis is not indicated and will not give any additional information.  Would recommend DC MRI 2. ID consult is recommended for course of treatment recommendations for osteomyelitis. 3. The patient is on hospice. I do not think this treatment will improve her quality of life and goals of care should be addressed with the family.   4. No further general surgery needed.  Will defer further wound care to primary service and WOC, RN.  We will sign off.    LOS: 1 day    Linken Mcglothen E 01/04/2014, 3:03 PM Pager: [phone]

## 2014-01-04 NOTE — Progress Notes (Addendum)
INITIAL NUTRITION ASSESSMENT  DOCUMENTATION CODES Per approved criteria  -Obesity Unspecified   INTERVENTION:  Continue Ensure Complete TID and Prostat 30 ml TID to maximize protein and calorie intake.  Liquid MVI 5 ml daily.  NUTRITION DIAGNOSIS: Increased nutrient needs related to wounds as evidenced by estimated protein and calorie needs.   Goal: Intake to meet >90% of estimated nutrition needs.  Monitor:  PO intake, labs, weight trend.  Reason for Assessment: Low [place]  79 y.o. female  Admitting Dx: Sepsis secondary to sacral decubitus ulcer  ASSESSMENT: 79 y.o. female presenting with sepsis . PMH is significant for dementia, CVA, stage 4 decubitus ulcer, hypertension, and vitamin D deficiency. Followed by Hospice and [place].  S/P bedside swallow evaluation with SLP today; recommends dysphagia 1 diet with thin liquids. Patient with increased protein needs to support wound healing. [place] RN following. Nutrition focused physical exam completed.  No muscle or subcutaneous fat depletion noticed.  Height: Ht Readings from Last 1 Encounters:  01/03/14 5' 8.9" (1.75 m)    Weight: Wt Readings from Last 1 Encounters:  01/03/14 210 lb 1.6 oz (95.3 kg)    Ideal Body Weight: 65.9 kg  % Ideal Body Weight: 145%  Wt Readings from Last 10 Encounters:  01/03/14 210 lb 1.6 oz (95.3 kg)  10/25/13 212 lb 12.8 oz (96.525 kg)  08/22/13 199 lb 12.8 oz (90.629 kg)  07/14/13 201 lb 9.6 oz (91.445 kg)  06/23/13 197 lb 12.8 oz (89.721 kg)  04/21/13 227 lb (102.967 kg)  03/27/13 228 lb 9.9 oz (103.7 kg)  03/25/13 222 lb 10.6 oz (100.999 kg)  02/22/13 251 lb 5.2 oz (114 kg)  02/22/13 251 lb 5.2 oz (114 kg)    Usual Body Weight: 197 lb (6 months ago)  % Usual Body Weight: 107%  BMI:  Body mass index is 31.12 kg/(m^2). class 1 obesity  Estimated Nutritional Needs: Kcal: 2000 Protein: >/= 125 gm Fluid: 2 L  Skin: chronic non-healing stage 4 pressure  ulcer; right buttock stage 2 pressure ulcer  Diet Order: Dysphagia 1 with thin liquids  EDUCATION NEEDS: -Education not appropriate at this time   Intake/Output Summary (Last 24 hours) at 01/04/14 1507 Last data filed at 01/04/14 0933  Gross per 24 hour  Intake 1011.5 ml  Output    800 ml  Net  211.5 ml    Last BM: PTA   Labs:   Recent Labs Lab 01/03/14 1304 01/04/14 0615  NA 160* 155*  K 4.6 4.2  CL 122* 121*  CO2 24 21  BUN 53* 44*  CREATININE 0.83 0.68  CALCIUM 9.3 9.3  GLUCOSE 210* 168*    CBG (last 3)  No results found for this basename: GLUCAP,  in the last 72 hours  Scheduled Meds: . antiseptic oral rinse  7 mL Mouth Rinse q12n4p  . calcium citrate  400 mg of elemental calcium Oral Q breakfast  . chlorhexidine  15 mL Mouth Rinse BID  . Chlorhexidine Gluconate Cloth  6 each Topical Q0600  . cloNIDine  0.1 mg Oral BID  . dipyridamole-aspirin  1 capsule Oral BID  . feeding supplement (ENSURE COMPLETE)  237 mL Oral TID  . feeding supplement (PRO-STAT SUGAR FREE 64)  30 mL Oral TID WC  . fluticasone  1 spray Each Nare Daily  . heparin  5,000 Units Subcutaneous 3 times per day  . imipenem-cilastatin  500 mg Intravenous 3 times per day  . latanoprost  1 drop Both  Eyes QHS  . mupirocin ointment  1 application Nasal BID  . traMADol  50 mg Oral 3 times per day  . vancomycin  1,250 mg Intravenous Q12H  . vitamin C  500 mg Oral BID  . [START ON 01/28/2014] Vitamin D (Ergocalciferol)  50,000 Units Oral Q30 days    Continuous Infusions: . sodium chloride 50 mL/hr at 01/04/14 2119    Past Medical History  Diagnosis Date  . Aphasia   . Muscle weakness   . Malaise   . Fatigue   . Breast neoplasm   . Hypertension   . Glaucoma   . UTI (lower urinary tract infection)   . Vitamin D deficiency   . Hiatal hernia   . Constipation   . Diarrhea   . Asthma   . Obesity   . Hyperlipidemia   . Alzheimer's dementia   . Depression   . Allergic rhinitis   .  Dementia   . DVT (deep venous thrombosis) 2014    "put her on Xeralto" (01/20/2013)  . Pneumonia 2014  . Shortness of breath     "lately; just laying there" (01/20/2013)  . Type II diabetes mellitus   . Anemia   . History of blood transfusion     "got a unit yesterday" (01/20/2013)  . Stroke 2006; 2007    residual:  "mute, dementia, generalized weakness" (01/20/2013)  . Recurrent UTI     "at least 3 since 2008 when she went into nursing home" (01/20/2013)  . Breast cancer     "right" (01/20/2013)  . Depression   . Hospice care patient     active with [facility] - call [phone] with encounters  . C. difficile colitis 02/23/2013  . Coagulase negative Staphylococcus bacteremia 02/12/2013  . Sepsis due to Klebsiella pneumoniae 03/30/2013  . Stage IV pressure ulcer of sacral region 02/15/2012    Stage 4 10/27/13.  Followed by Wound Care team at [facility].    . H/O: CVA (cerebrovascular accident) 10/27/2013    Brain MRI 01/14/2013: Remote lacunar infarcts of the cerebellum bilaterally, right greater than left. Remote old encephalomalacia of the left parietal and occipital lobe as well as the anterior right frontal lobe.  Residual right hemiparesis  Persistent expressive aphasia  . 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 Joint replacement      left knee in '90s  . Breast lumpectomy Right 1990's; 2000's    "she's had 2" (01/20/2013)  . Replacement total knee bilateral Bilateral 1990's  . Tee without cardioversion N/A 02/17/2013    Procedure: TRANSESOPHAGEAL ECHOCARDIOGRAM (TEE);  Surgeon: Dorothy Spark, MD;  Location: [facility];  Service: Cardiovascular;  Laterality: N/A;    Molli Barrows, [place], [place], [place] Pager [phone] After Hours Pager [phone]

## 2014-01-04 NOTE — Progress Notes (Signed)
Inpatient Rm [place] 5W 10 [facility] RN Visit Related admission to [facility] Dx: Unspecified Cerebrovascular disease; pt DNR Code status GOLD DNR Form accompanied pt to the hospital Pt seen at bedside by [facility] RN Newman Nickels, no family present.pt asleep eyes clothes rhythmic breathing; did not awake to soft voice; spoke with staff RN Apolonio Schneiders; pt recently seen by SLP Dysphagia 1 thin liquids recommended; Wound consult is pending; IV access lost; to be restarted Per chart review Dr Lonny Prude has spoken with pt's sister who is requesting treatment and orthopedic surgery consult [facility] team will be informed of above and continue to follow [facility] Medication list, transfer summary in shadow chart; please call [phone] with any questions/concerns  Danton Sewer, RN MSN, [facility] Liaison [phone]

## 2014-01-04 NOTE — Clinical Social Work Note (Signed)
CSW notes consult for patient regarding her admission from [facility]. CSW unable to assess patient today. CSW will assess patient as soon as possible.  Liz Beach MSW, [place], [place], [phone]

## 2014-01-04 NOTE — Progress Notes (Signed)
Family Medicine Teaching Service Daily Progress Note Intern Pager: [phone]  Patient name: [name] Medical record number: [id number] Date of birth: 1934-10-23 Age: 79 y.o. Gender: female  Primary Care Provider: Vance Gather, MD Consultants: General Surgery Code Status: Full  Pt Overview and Major Events to Date:  10/21: admitted to hospital under family medicine care; met sepsis criteria. 10/22: General Surgery, Palliative Care, and Wound Care consulted  Assessment and Plan: [name] is a 79 y.o. female presenting with sepsis . PMH is significant for dementia, CVA, stage 4 decubitus ulcer, hypertension, vitamin D deficiency   # Sepsis: secondary to sacral decubitus ulcer/sacral osteomyelitis. Patient currently only with mild tachycardia and tachypnea. Blood pressure stable. Patient s/p ceftriaxone x1 in the ED and currently on vancomycin. Spoke with sister and she would like treatment and consult to Gen. surgery  - MRI to assess for osteomyelitis  - SLP eval - dysphagia 1 - Continue vancomycin; add imipenem (discontinue cefepime) - General Surgery Consult - will see - Palliative care consult - will see 10/22 or 10/23  # Stage 4 decubitus ulcer - wound consult  - continue home tramadol 69m TID  - management as above   # Hypernatremia - sodium 160 on admission - Half-normal saline at 50 mL/hour - Sodium 155 on 10/22 - will continue to follow; followup BMP at 5 PM 10/22  # Hypertension  - continue clonidine   # CAD  - continue aggrenox   # Vitamin D deficiency  - continue calcium and vitamin D   FEN/GI: NPO until bedside swallow, continue supplements (after assessment)  Prophylaxis: heparin subq, miralax, zofran for nausea   Disposition: pending surgery and palliative consult.  Subjective:  Patient is lying in bed. She is noncommunicative. She does appear to have obvious pain when medical and nursing staff roll her to observe decubitus sacral ulcer. Does not appear  to be in any acute distress.  Objective: Temp:  [97.7 F (36.5 C)-98.4 F (36.9 C)] 98.1 F (36.7 C) (10/22 0517) Pulse Rate:  [91-98] 91 (10/22 1119) Resp:  [18-20] 20 (10/22 0517) BP: (133-170)/(71-107) 137/100 mmHg (10/22 1119) SpO2:  [96 %-100 %] 99 % (10/22 0517) Weight:  [210 lb 1.6 oz (95.3 kg)-212 lb 11.9 oz (96.5 kg)] 210 lb 1.6 oz (95.3 kg) (10/21 2026) Physical Exam: General: Laying in bed, non-verbal, does not appear in distress  Cardiovascular: Regular rate and rhythm, 3/6 crescendo-decrescendo murmur heard best at LUSB and holosystolic blowing murmur best heard at apex  Respiratory: Anterior auscultation. Clear to auscultation bilaterally  Abdomen: Soft, non-tender, non-distended  Extremities: no edema  Skin: large wound at sacral location with macerated skin at periphery. Entire wound is about 7cm x 7cm. sacral bone w/ obvious exposure. Foul odor present  Neuro: Alert, noncommunicative. Response to some stimuli.   Laboratory:  Recent Labs Lab 01/03/14 1304 01/04/14 0615  WBC 10.7* 9.4  HGB 10.2* 9.2*  HCT 33.7* 30.5*  PLT 286 230    Recent Labs Lab 01/03/14 1304 01/04/14 0615  NA 160* 155*  K 4.6 4.2  CL 122* 121*  CO2 24 21  BUN 53* 44*  CREATININE 0.83 0.68  CALCIUM 9.3 9.3  GLUCOSE 210* 168*    Imaging/Diagnostic Tests: MRI pelvis - pending  IElberta Leatherwood MD 01/04/2014, 3:01 PM PGY-1, CBailey's CrossroadsIntern pager: [phone] text pages welcome

## 2014-01-04 NOTE — Evaluation (Signed)
Clinical/Bedside Swallow Evaluation Patient Details  Name: [name] MRN: [id number] Date of Birth: Aug 04, 1934  Today's Date: 01/04/2014 Time: 0900-0918 SLP Time Calculation (min): 18 min  Past Medical History:  Past Medical History  Diagnosis Date  . Aphasia   . Muscle weakness   . Malaise   . Fatigue   . Breast neoplasm   . Hypertension   . Glaucoma   . UTI (lower urinary tract infection)   . Vitamin D deficiency   . Hiatal hernia   . Constipation   . Diarrhea   . Asthma   . Obesity   . Hyperlipidemia   . Alzheimer's dementia   . Depression   . Allergic rhinitis   . Dementia   . DVT (deep venous thrombosis) 2014    "put her on Xeralto" (01/20/2013)  . Pneumonia 2014  . Shortness of breath     "lately; just laying there" (01/20/2013)  . Type II diabetes mellitus   . Anemia   . History of blood transfusion     "got a unit yesterday" (01/20/2013)  . Stroke 2006; 2007    residual:  "mute, dementia, generalized weakness" (01/20/2013)  . Recurrent UTI     "at least 3 since 2008 when she went into nursing home" (01/20/2013)  . Breast cancer     "right" (01/20/2013)  . Depression   . Hospice care patient     active with [facility] - call [phone] with encounters  . C. difficile colitis 02/23/2013  . Coagulase negative Staphylococcus bacteremia 02/12/2013  . Sepsis due to Klebsiella pneumoniae 03/30/2013  . Stage IV pressure ulcer of sacral region 02/15/2012    Stage 4 10/27/13.  Followed by Wound Care team at [facility].    . H/O: CVA (cerebrovascular accident) 10/27/2013    Brain MRI 01/14/2013: Remote lacunar infarcts of the cerebellum bilaterally, right greater than left. Remote old encephalomalacia of the left parietal and occipital lobe as well as the anterior right frontal lobe.  Residual right hemiparesis  Persistent expressive aphasia  . 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 Seromas by mammo and [place] Feb 2014     Past Surgical History:  Past Surgical History  Procedure Laterality Date  . Joint replacement      left knee in '90s  . Breast lumpectomy Right 1990's; 2000's    "she's had 2" (01/20/2013)  . Replacement total knee bilateral Bilateral 1990's  . Tee without cardioversion N/A 02/17/2013    Procedure: TRANSESOPHAGEAL ECHOCARDIOGRAM (TEE);  Surgeon: Dorothy Spark, MD;  Location: [facility];  Service: Cardiovascular;  Laterality: N/A;   HPI:  [name] is a 79 y.o. female presenting with sepsis secondary to sacral decubitus ulcer. PMH is significant for dementia, CVA wtih expressive aphasia, stage 4 decubitus ulcer, hypertension, vitamin D deficiency, and dysphagia requiring Dys 1 textures and thin liquids without straws across multiple bedside swallow evaluations. CXR upon admission was clear of acute disease.   Assessment / Plan / Recommendation Clinical Impression  Pt's primarily cognitively-based dysphagia appears similar to previous admissions, with oral holding and prolonged formation with pureed boluses. Automaticity of swallow improved as trials progressed, with prep time decreasing from ~15 seconds per bolus to 4 seconds. An immediate cough was noted x1 during a period of impulsivity in which pt consumed a large sip with decreased control noted with anterior loss and suspected premature spillage resulting in likely aspiration. With Mod A from SLP for  small, controlled sips, no further signs of aspiration were observed. Will initiate Dys 1 textures and thin liquids with full supervision. SLP to follow briefly for tolerance given signs of dysphagia and likely mild decline in functional reserve given acute infection.    Aspiration Risk  Mild    Diet Recommendation Dysphagia 1 (Puree);Thin liquid   Liquid Administration via: Cup;No straw Medication Administration: Crushed with puree Supervision: Full supervision/cueing for compensatory strategies;Patient able to self  feed Compensations: Slow rate;Small sips/bites;Check for pocketing;Check for anterior loss Postural Changes and/or Swallow Maneuvers: Seated upright 90 degrees    Other  Recommendations Oral Care Recommendations: Oral care BID   Follow Up Recommendations  Skilled Nursing facility;24 hour supervision/assistance    Frequency and Duration min 2x/week  1 week   Pertinent Vitals/Pain n/a    SLP Swallow Goals     Swallow Study Prior Functional Status       General Date of Onset: 01/03/14 (chronic element to dysphagia) HPI: [name] is a 79 y.o. female presenting with sepsis secondary to sacral decubitus ulcer. PMH is significant for dementia, CVA wtih expressive aphasia, stage 4 decubitus ulcer, hypertension, vitamin D deficiency, and dysphagia requiring Dys 1 textures and thin liquids without straws across multiple bedside swallow evaluations. CXR upon admission was clear of acute disease. Type of Study: Bedside swallow evaluation Previous Swallow Assessment: see HPI Diet Prior to this Study: NPO Temperature Spikes Noted: Yes Respiratory Status: Room air History of Recent Intubation: No Behavior/Cognition: Alert;Cooperative;Requires cueing Oral Cavity - Dentition: Adequate natural dentition Self-Feeding Abilities: Able to feed self;Needs assist Patient Positioning: Upright in bed Baseline Vocal Quality: Other (comment) (UTA - no vocalizaions heard)    Oral/Motor/Sensory Function Overall Oral Motor/Sensory Function:  (difficulty following commands to participate)   Ice Chips Ice chips: Not tested   Thin Liquid Thin Liquid: Impaired Presentation: Cup;Self Fed Oral Phase Impairments: Reduced labial seal;Poor awareness of bolus Oral Phase Functional Implications: Left anterior spillage Pharyngeal  Phase Impairments: Suspected delayed Swallow;Cough - Immediate (x1 after initial, large sip)    Nectar Thick Nectar Thick Liquid: Not tested   Honey Thick Honey Thick Liquid: Not  tested   Puree Puree: Impaired Presentation: Spoon Oral Phase Impairments: Reduced lingual movement/coordination;Impaired anterior to posterior transit;Poor awareness of bolus Oral Phase Functional Implications: Prolonged oral transit;Oral holding Pharyngeal Phase Impairments: Suspected delayed Swallow   Solid   GO    Solid: Not tested        Germain Osgood, M.A. CCC-SLP [phone]  Germain Osgood 01/04/2014,9:29 AM

## 2014-01-04 NOTE — Consult Note (Signed)
WOC wound consult note  Pt nonverbal and no family in the room.  Review of records, onset around November 2014. Chronic non healing pressure ulcer. Reason for Consult: sacral ulcer Wound type: Stage IV Pressure Ulcer; Stage II Pressure ulcer Pressure Ulcer POA: Yes x 2 Measurement: Sacrum: 9cm x 8cm x 3cm with undermining from 1-3 o'clock 3.5cm  Right buttock: 2.0cm x 3.0cm x 0.2cm  Wound bed: Sacrum: mostly clean, pink, 80% pink/20% grey/yellow at the base surrounding the coccyx bone. Right buttock: pink, moist, with epithelial buds, 30% yellow at the wound peripherally Drainage (amount, consistency, odor) moderate serosanguinous, some odor Periwound: intact  Dressing procedure/placement/frequency: Normal saline packing and moist gauze to the sacral ulcer and the adjacent Stage II area, cover with ABD pads, secure with tape. Air mattress ordered and outside the room pending patient transport for diagnostic testing.   [place] team will follow along with you for weekly wound assessments.  Please notify me of any acute changes in the wounds or any new areas of concerns Para March RN,CWOCN [phone]

## 2014-01-04 NOTE — Progress Notes (Signed)
ANTIBIOTIC CONSULT NOTE - INITIAL  Pharmacy Consult for Imipenem (DC cefepime) Indication: osteomyelitis  Allergies  Allergen Reactions  . Penicillins Other (See Comments)    Per MAR.  Pt has tolerated IV Ceftriaxone without difficulties (03/28/13 hospitalization).  . Tetanus Toxoids Other (See Comments)    Per MAR  . Vicodin [Hydrocodone-Acetaminophen] Other (See Comments)    Per [facility]    Patient Measurements: Height: 5' 8.9" (175 cm) Weight: 210 lb 1.6 oz (95.3 kg) IBW/kg (Calculated) : 65.97 Height 175 cm Adjusted Body Weight: 77.7 kg  Vital Signs: Temp: 98.1 F (36.7 C) (10/22 0517) Temp Source: Oral (10/22 0517) BP: 137/100 mmHg (10/22 1119) Pulse Rate: 91 (10/22 1119) Intake/Output from previous day: 10/21 0701 - 10/22 0700 In: 1011.5 [P.O.:444; I.V.:517.5; IV Piggyback:50] Out: 40 [Urine:40] Intake/Output from this shift: Total I/O In: 0  Out: 800 [Urine:800]  Labs:  Recent Labs  01/03/14 1304 01/04/14 0615  WBC 10.7* 9.4  HGB 10.2* 9.2*  PLT 286 230  CREATININE 0.83 0.68   Estimated Creatinine Clearance: 72.2 ml/min (by C-G formula based on Cr of 0.68). No results found for this basename: VANCOTROUGH, VANCOPEAK, VANCORANDOM, [place], [place], [place], [place], [place], [place], AMIKACINPEAK, AMIKACINTROU, AMIKACIN,  in the last 72 hours   Microbiology: Recent Results (from the past 720 hour(s))  CULTURE, BLOOD (ROUTINE X 2)     Status: None   Collection Time    01/03/14  1:04 PM      Result Value Ref Range Status   Specimen Description BLOOD BLOOD RIGHT FOREARM   Final   Special Requests BOTTLES DRAWN AEROBIC AND ANAEROBIC 5 ML   Final   Culture  Setup Time     Final   Value: 01/03/2014 20:29     Performed at [facility]   Culture     Final   Value:        BLOOD CULTURE RECEIVED NO GROWTH TO DATE CULTURE WILL BE HELD FOR 5 DAYS BEFORE ISSUING A FINAL NEGATIVE REPORT     Performed at [facility]   Report Status  PENDING   Incomplete  CULTURE, BLOOD (ROUTINE X 2)     Status: None   Collection Time    01/03/14  1:20 PM      Result Value Ref Range Status   Specimen Description BLOOD RIGHT HAND   Final   Special Requests BOTTLES DRAWN AEROBIC AND ANAEROBIC 5 ML   Final   Culture  Setup Time     Final   Value: 01/03/2014 20:29     Performed at [facility]   Culture     Final   Value:        BLOOD CULTURE RECEIVED NO GROWTH TO DATE CULTURE WILL BE HELD FOR 5 DAYS BEFORE ISSUING A FINAL NEGATIVE REPORT     Performed at [facility]   Report Status PENDING   Incomplete  MRSA PCR SCREENING     Status: Abnormal   Collection Time    01/03/14  8:40 PM      Result Value Ref Range Status   MRSA by PCR POSITIVE (*) NEGATIVE Final   Comment:            The GeneXpert MRSA Assay (FDA     approved for NASAL specimens     only), is one component of a     comprehensive MRSA colonization     surveillance program. It is not     intended to diagnose MRSA  infection nor to guide or     monitor treatment for     MRSA infections.     RESULT CALLED TO, READ BACK BY AND VERIFIED WITHLianne Moris RN 2157 01/03/14 A BROWNING    Medical History: Past Medical History  Diagnosis Date  . Aphasia   . Muscle weakness   . Malaise   . Fatigue   . Breast neoplasm   . Hypertension   . Glaucoma   . UTI (lower urinary tract infection)   . Vitamin D deficiency   . Hiatal hernia   . Constipation   . Diarrhea   . Asthma   . Obesity   . Hyperlipidemia   . Alzheimer's dementia   . Depression   . Allergic rhinitis   . Dementia   . DVT (deep venous thrombosis) 2014    "put her on Xeralto" (01/20/2013)  . Pneumonia 2014  . Shortness of breath     "lately; just laying there" (01/20/2013)  . Type II diabetes mellitus   . Anemia   . History of blood transfusion     "got a unit yesterday" (01/20/2013)  . Stroke 2006; 2007    residual:  "mute, dementia, generalized weakness" (01/20/2013)  .  Recurrent UTI     "at least 3 since 2008 when she went into nursing home" (01/20/2013)  . Breast cancer     "right" (01/20/2013)  . Depression   . Hospice care patient     active with [facility] - call [phone] with encounters  . C. difficile colitis 02/23/2013  . Coagulase negative Staphylococcus bacteremia 02/12/2013  . Sepsis due to Klebsiella pneumoniae 03/30/2013  . Stage IV pressure ulcer of sacral region 02/15/2012    Stage 4 10/27/13.  Followed by Wound Care team at [facility].    . H/O: CVA (cerebrovascular accident) 10/27/2013    Brain MRI 01/14/2013: Remote lacunar infarcts of the cerebellum bilaterally, right greater than left. Remote old encephalomalacia of the left parietal and occipital lobe as well as the anterior right frontal lobe.  Residual right hemiparesis  Persistent expressive aphasia  . 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 Seromas by mammo and [place] Feb 2014     Medications:  Anti-infectives   Start     Dose/Rate Route Frequency Ordered Stop   01/04/14 2200  vancomycin (VANCOCIN) 1,250 mg in sodium chloride 0.9 % 250 mL IVPB     1,250 mg 166.7 mL/hr over 90 Minutes Intravenous Every 12 hours 01/03/14 2155     01/03/14 2200  ceFEPIme (MAXIPIME) 1 g in dextrose 5 % 50 mL IVPB  Status:  Discontinued     1 g 100 mL/hr over 30 Minutes Intravenous 3 times per day 01/03/14 2154 01/03/14 2326   01/03/14 1415  vancomycin (VANCOCIN) IVPB 1000 mg/200 mL premix  Status:  Discontinued     1,000 mg 200 mL/hr over 60 Minutes Intravenous Every 8 hours 01/03/14 1407 01/03/14 2155   01/03/14 1315  cefTRIAXone (ROCEPHIN) 1 g in dextrose 5 % 50 mL IVPB     1 g 100 mL/hr over 30 Minutes Intravenous  Once 01/03/14 1310 01/03/14 1357     Assessment: 79 year old female with sacral ulcer down to the bone on vancomycin for per pharmacy consult to add gram negative coverage. SCr 0.68. Estimated CrCl ~ 65-58mL/min.  WBC decreased to  9.4K within normal limits. Afebrile, Tmax 100.2.  Blood cultures  no growth to date.  MRSA PCR positive.  MD has discontinued the cefepime and starting Imipenem.   Goal of Therapy:  Vancomycin trough level 15-20 mcg/ml Clinical resolution of infection.   Plan:  1. Start Imipenem 500 mg IV q8h (Cefepime DC'd) 2. Decrease vancomycin to 1250 mg IV q12h.  3. Monitor renal function and adjust therapy as needed.   Nicole Cella, RPh Clinical Pharmacist Pager: [phone] 01/04/2014,12:15 PM

## 2014-01-04 NOTE — Consult Note (Signed)
I agree with the assessment and plan.

## 2014-01-05 ENCOUNTER — Encounter (HOSPITAL_COMMUNITY): Payer: Self-pay | Admitting: Family Medicine

## 2014-01-05 ENCOUNTER — Telehealth: Payer: Self-pay | Admitting: Family Medicine

## 2014-01-05 DIAGNOSIS — I350 Nonrheumatic aortic (valve) stenosis: Secondary | ICD-10-CM

## 2014-01-05 DIAGNOSIS — M4628 Osteomyelitis of vertebra, sacral and sacrococcygeal region: Secondary | ICD-10-CM

## 2014-01-05 DIAGNOSIS — N390 Urinary tract infection, site not specified: Secondary | ICD-10-CM

## 2014-01-05 DIAGNOSIS — A419 Sepsis, unspecified organism: Secondary | ICD-10-CM

## 2014-01-05 DIAGNOSIS — L98499 Non-pressure chronic ulcer of skin of other sites with unspecified severity: Secondary | ICD-10-CM

## 2014-01-05 DIAGNOSIS — L89154 Pressure ulcer of sacral region, stage 4: Secondary | ICD-10-CM

## 2014-01-05 DIAGNOSIS — E11622 Type 2 diabetes mellitus with other skin ulcer: Secondary | ICD-10-CM

## 2014-01-05 DIAGNOSIS — E870 Hyperosmolality and hypernatremia: Secondary | ICD-10-CM | POA: Diagnosis present

## 2014-01-05 DIAGNOSIS — Z515 Encounter for palliative care: Secondary | ICD-10-CM

## 2014-01-05 DIAGNOSIS — T8351XA Infection and inflammatory reaction due to indwelling urinary catheter, initial encounter: Secondary | ICD-10-CM

## 2014-01-05 DIAGNOSIS — Z22322 Carrier or suspected carrier of Methicillin resistant Staphylococcus aureus: Secondary | ICD-10-CM

## 2014-01-05 DIAGNOSIS — Z66 Do not resuscitate: Secondary | ICD-10-CM | POA: Diagnosis present

## 2014-01-05 DIAGNOSIS — M868X8 Other osteomyelitis, other site: Secondary | ICD-10-CM

## 2014-01-05 HISTORY — DX: Osteomyelitis of vertebra, sacral and sacrococcygeal region: M46.28

## 2014-01-05 LAB — BASIC METABOLIC PANEL
ANION GAP: 12 (ref 5–15)
ANION GAP: 14 (ref 5–15)
Anion gap: 14 (ref 5–15)
BUN: 49 mg/dL — ABNORMAL HIGH (ref 6–23)
BUN: 50 mg/dL — ABNORMAL HIGH (ref 6–23)
BUN: 47 mg/dL — ABNORMAL HIGH (ref 6–23)
CALCIUM: 9.3 mg/dL (ref 8.4–10.5)
CHLORIDE: 119 meq/L — AB (ref 96–112)
CO2: 19 meq/L (ref 19–32)
CO2: 22 meq/L (ref 19–32)
CO2: 23 meq/L (ref 19–32)
CREATININE: 0.73 mg/dL (ref 0.50–1.10)
Calcium: 9.1 mg/dL (ref 8.4–10.5)
Calcium: 9 mg/dL (ref 8.4–10.5)
Chloride: 119 mEq/L — ABNORMAL HIGH (ref 96–112)
Chloride: 119 mEq/L — ABNORMAL HIGH (ref 96–112)
Creatinine, Ser: 0.79 mg/dL (ref 0.50–1.10)
Creatinine, Ser: 0.68 mg/dL (ref 0.50–1.10)
GFR calc Af Amer: >90 mL/min (ref >90)
GFR calc Af Amer: >90 mL/min (ref >90)
GFR calc Af Amer: >90 mL/min (ref >90)
GFR calc non Af Amer: 78 mL/min — ABNORMAL LOW (ref >90)
GFR calc non Af Amer: 82 mL/min — ABNORMAL LOW (ref >90)
GFR calc non Af Amer: 80 mL/min — ABNORMAL LOW (ref >90)
GLUCOSE: 177 mg/dL — AB (ref 70–99)
GLUCOSE: 189 mg/dL — AB (ref 70–99)
Glucose, Bld: 177 mg/dL — ABNORMAL HIGH (ref 70–99)
POTASSIUM: 6 meq/L — AB (ref 3.7–5.3)
Potassium: 4 mEq/L (ref 3.7–5.3)
Potassium: 4 mEq/L (ref 3.7–5.3)
SODIUM: 152 meq/L — AB (ref 137–147)
SODIUM: 154 meq/L — AB (ref 137–147)
Sodium: 155 mEq/L — ABNORMAL HIGH (ref 137–147)

## 2014-01-05 LAB — C-REACTIVE PROTEIN: CRP: 17 mg/dL — AB (ref <0.60)

## 2014-01-05 LAB — SEDIMENTATION RATE: SED RATE: 106 mm/h — AB (ref 0–22)

## 2014-01-05 MED ORDER — DEXTROSE-NACL 5-0.2 % IV SOLN
INTRAVENOUS | Status: DC
  Administered 2014-01-05 – 2014-01-08 (×6): via INTRAVENOUS
  Administered 2014-01-08: 175 mL/h via INTRAVENOUS
  Filled 2014-01-05 (×2): qty 1000

## 2014-01-05 MED ORDER — MORPHINE SULFATE (CONCENTRATE) 10 MG /0.5 ML PO SOLN
5.0000 mg | Freq: Three times a day (TID) | ORAL | Status: DC
  Administered 2014-01-05 – 2014-01-10 (×15): 5 mg via ORAL
  Filled 2014-01-05 (×15): qty 0.5

## 2014-01-05 MED ORDER — METRONIDAZOLE 500 MG PO TABS
500.0000 mg | ORAL_TABLET | Freq: Three times a day (TID) | ORAL | Status: DC
  Administered 2014-01-05 – 2014-01-06 (×2): 500 mg via ORAL
  Filled 2014-01-05 (×5): qty 1

## 2014-01-05 MED ORDER — SENNOSIDES-DOCUSATE SODIUM 8.6-50 MG PO TABS
1.0000 | ORAL_TABLET | Freq: Every day | ORAL | Status: DC
  Administered 2014-01-05 – 2014-01-09 (×5): 1 via ORAL
  Filled 2014-01-05 (×5): qty 1

## 2014-01-05 MED ORDER — DEXTROSE 5 % IV SOLN
2.0000 g | INTRAVENOUS | Status: DC
  Administered 2014-01-05 – 2014-01-10 (×6): 2 g via INTRAVENOUS
  Filled 2014-01-05 (×7): qty 2

## 2014-01-05 MED ORDER — MORPHINE SULFATE 2 MG/ML IJ SOLN
2.0000 mg | INTRAMUSCULAR | Status: DC | PRN
  Administered 2014-01-08 (×2): 2 mg via INTRAVENOUS
  Filled 2014-01-05 (×2): qty 1

## 2014-01-05 MED ORDER — SODIUM POLYSTYRENE SULFONATE 15 GM/60ML PO SUSP
15.0000 g | Freq: Once | ORAL | Status: AC
  Administered 2014-01-05: 15 g via ORAL
  Filled 2014-01-05: qty 60

## 2014-01-05 NOTE — Clinical Social Work Note (Signed)
CSW went by room to complete assessment. Patient is non-verbal, and no family currently at bedside. CSW has left messages for the patient's family members listed in chart. CSW will continue to follow for DC needs. FL2 has been completed and is in chart for MD signature.  Liz Beach MSW, [place], [place], [phone]

## 2014-01-05 NOTE — Consult Note (Addendum)
[place] for Infectious Disease  Total days of antibiotics 3        Day 3 vanco        Day 2 impinem               Reason for Consult: sacral osteomyelitis    Referring Physician: breen  Active Problems:   Sepsis    HPI: [name] is a 79 y.o. female  Hx of CVA, aphasia, SNF who is on hospice care was admitted for fevers thought to be related to worsening stage 4 sacral decub. In the ED, she had temp of 101 with tachypnea. Labs did not reveal leukocytosis. She was started on vanco and ceftriaxone empirically then converted to vancomycin and imipenem by admitting team. She no longer has fevers since admission. Sacral decub ulcer is described by wound care RN as 9x8x3cm with undermining from 1-3 o'clock. She also has a stage 2 ulcer just right lateral to this that is 2x3x0.2cm that does have a small eschar but some surrounding pink tissue noted. The large stage 4 wound does have exposed sacrum. The wound itself is not infected. Patient's sister reports that this has been ongoing for the past 3-4 months. Unclear if previously treated for osteomyelitis in the past. The patient has had history of cdifficile in 2014 and also influenza in jan 2015.    Past Medical History  Diagnosis Date  . Aphasia   . Muscle weakness   . Malaise   . Fatigue   . Breast neoplasm   . Hypertension   . Glaucoma   . UTI (lower urinary tract infection)   . Vitamin D deficiency   . Hiatal hernia   . Constipation   . Diarrhea   . Asthma   . Obesity   . Hyperlipidemia   . Alzheimer's dementia   . Depression   . Allergic rhinitis   . Dementia   . DVT (deep venous thrombosis) 2014    "put her on Xeralto" (01/20/2013)  . Pneumonia 2014  . Shortness of breath     "lately; just laying there" (01/20/2013)  . Type II diabetes mellitus   . Anemia   . History of blood transfusion     "got a unit yesterday" (01/20/2013)  . Stroke 2006; 2007    residual:  "mute, dementia, generalized weakness"  (01/20/2013)  . Recurrent UTI     "at least 3 since 2008 when she went into nursing home" (01/20/2013)  . Breast cancer     "right" (01/20/2013)  . Depression   . Hospice care patient     active with [facility] - call [phone] with encounters  . C. difficile colitis 02/23/2013  . Coagulase negative Staphylococcus bacteremia 02/12/2013  . Sepsis due to Klebsiella pneumoniae 03/30/2013  . Stage IV pressure ulcer of sacral region 02/15/2012    Stage 4 10/27/13.  Followed by Wound Care team at [facility].    . H/O: CVA (cerebrovascular accident) 10/27/2013    Brain MRI 01/14/2013: Remote lacunar infarcts of the cerebellum bilaterally, right greater than left. Remote old encephalomalacia of the left parietal and occipital lobe as well as the anterior right frontal lobe.  Residual right hemiparesis  Persistent expressive aphasia  . 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 Seromas by mammo and [place] Feb 2014     Allergies:  Allergies  Allergen Reactions  . Penicillins Other (See Comments)    Per MAR.  Pt has tolerated IV Ceftriaxone without difficulties (03/28/13 hospitalization).  . Tetanus Toxoids Other (See Comments)    Per MAR  . Vicodin [Hydrocodone-Acetaminophen] Other (See Comments)    Per MAR     MEDICATIONS: . antiseptic oral rinse  7 mL Mouth Rinse q12n4p  . calcium citrate  400 mg of elemental calcium Oral Q breakfast  . chlorhexidine  15 mL Mouth Rinse BID  . Chlorhexidine Gluconate Cloth  6 each Topical Q0600  . cloNIDine  0.1 mg Oral BID  . collagenase   Topical Daily  . dipyridamole-aspirin  1 capsule Oral BID  . feeding supplement (ENSURE COMPLETE)  237 mL Oral TID  . feeding supplement (PRO-STAT SUGAR FREE 64)  30 mL Oral TID WC  . fluticasone  1 spray Each Nare Daily  . heparin  5,000 Units Subcutaneous 3 times per day  . imipenem-cilastatin  500 mg Intravenous 3 times per day  . latanoprost  1 drop Both Eyes QHS  .  multivitamin  5 mL Oral Daily  . mupirocin ointment  1 application Nasal BID  . traMADol  50 mg Oral 3 times per day  . vancomycin  1,250 mg Intravenous Q12H  . vitamin C  500 mg Oral BID  . [START ON 01/28/2014] Vitamin D (Ergocalciferol)  50,000 Units Oral Q30 days    History  Substance Use Topics  . Smoking status: Never Smoker   . Smokeless tobacco: Never Used  . Alcohol Use: No    History reviewed. No pertinent family history.  Review of Systems  Unable to answer due to sedation    OBJECTIVE: Temp:  [98 F (36.7 C)-98.4 F (36.9 C)] 98 F (36.7 C) (10/23 0620) Pulse Rate:  [91-99] 93 (10/23 0620) Resp:  [20] 20 (10/23 0620) BP: (137-153)/(83-100) 152/85 mmHg (10/23 0620) SpO2:  [97 %-99 %] 99 % (10/23 0620) General: Laying in bed, non-verbal, does not appear in distress  Cardiovascular: Regular rate and rhythm, 3/6 crescendo-decrescendo murmur heard best at LUSB and holosystolic blowing murmur best heard at apex  Respiratory:Clear to auscultation bilaterally  Abdomen: Soft, non-tender, non-distended  Extremities: no edema  Neuro: Alert, responds to some answers and commands. 4/5 grip strength bilaterally.  Skin: stage 4 sacral decubitus ulcer. Per WOC note her measurements are  9x8x3cm with undermining from 1-3 o'clock. She also has a stage 2 ulcer just right lateral to this that is 2x3x0.2cm that does have a small eschar but some surrounding pink tissue noted. The large stage 4 wound does have exposed sacrum. The wound itself is not infected. By definition, exposed bone is osteomyelitis, so she does have an infection of the sacrum.  LABS: Results for orders placed during the hospital encounter of 01/03/14 (from the past 48 hour(s))  CBC WITH DIFFERENTIAL     Status: Abnormal   Collection Time    01/03/14  1:04 PM      Result Value Ref Range   WBC 10.7 (*) 4.0 - 10.5 K/uL   RBC 4.25  3.87 - 5.11 MIL/uL   Hemoglobin 10.2 (*) 12.0 - 15.0 g/dL   HCT 33.7 (*) 36.0 -  46.0 %   MCV 79.3  78.0 - 100.0 fL   MCH 24.0 (*) 26.0 - 34.0 pg   MCHC 30.3  30.0 - 36.0 g/dL   RDW 17.7 (*) 11.5 - 15.5 %   Platelets 286  150 - 400 K/uL   Neutrophils Relative % 76  43 - 77 %   Neutro Abs  8.2 (*) 1.7 - 7.7 K/uL   Lymphocytes Relative 17  12 - 46 %   Lymphs Abs 1.8  0.7 - 4.0 K/uL   Monocytes Relative 6  3 - 12 %   Monocytes Absolute 0.6  0.1 - 1.0 K/uL   Eosinophils Relative 1  0 - 5 %   Eosinophils Absolute 0.1  0.0 - 0.7 K/uL   Basophils Relative 0  0 - 1 %   Basophils Absolute 0.0  0.0 - 0.1 K/uL  BASIC METABOLIC PANEL     Status: Abnormal   Collection Time    01/03/14  1:04 PM      Result Value Ref Range   Sodium 160 (*) 137 - 147 mEq/L   Potassium 4.6  3.7 - 5.3 mEq/L   Chloride 122 (*) 96 - 112 mEq/L   CO2 24  19 - 32 mEq/L   Glucose, Bld 210 (*) 70 - 99 mg/dL   BUN 53 (*) 6 - 23 mg/dL   Creatinine, Ser 0.83  0.50 - 1.10 mg/dL   Calcium 9.3  8.4 - 10.5 mg/dL   GFR calc non Af Amer 66 (*) >90 mL/min   GFR calc Af Amer 77 (*) >90 mL/min   Comment: (NOTE)     The eGFR has been calculated using the CKD EPI equation.     This calculation has not been validated in all clinical situations.     eGFR's persistently <90 mL/min signify possible Chronic Kidney     Disease.   Anion gap 14  5 - 15  CULTURE, BLOOD (ROUTINE X 2)     Status: None   Collection Time    01/03/14  1:04 PM      Result Value Ref Range   Specimen Description BLOOD BLOOD RIGHT FOREARM     Special Requests BOTTLES DRAWN AEROBIC AND ANAEROBIC 5 ML     Culture  Setup Time       Value: 01/03/2014 20:29     Performed at [facility]   Culture       Value:        BLOOD CULTURE RECEIVED NO GROWTH TO DATE CULTURE WILL BE HELD FOR 5 DAYS BEFORE ISSUING A FINAL NEGATIVE REPORT     Performed at [facility]   Report Status PENDING    LACTIC ACID, PLASMA     Status: None   Collection Time    01/03/14  1:20 PM      Result Value Ref Range   Lactic Acid, Venous 1.3  0.5 - 2.2  mmol/L  CULTURE, BLOOD (ROUTINE X 2)     Status: None   Collection Time    01/03/14  1:20 PM      Result Value Ref Range   Specimen Description BLOOD RIGHT HAND     Special Requests BOTTLES DRAWN AEROBIC AND ANAEROBIC 5 ML     Culture  Setup Time       Value: 01/03/2014 20:29     Performed at [facility]   Culture       Value:        BLOOD CULTURE RECEIVED NO GROWTH TO DATE CULTURE WILL BE HELD FOR 5 DAYS BEFORE ISSUING A FINAL NEGATIVE REPORT     Performed at [facility]   Report Status PENDING    URINALYSIS, ROUTINE W REFLEX MICROSCOPIC     Status: Abnormal   Collection Time    01/03/14  2:00 PM  Result Value Ref Range   Color, Urine AMBER (*) YELLOW   Comment: BIOCHEMICALS MAY BE AFFECTED BY COLOR   APPearance TURBID (*) CLEAR   Specific Gravity, Urine 1.027  1.005 - 1.030   pH 5.5  5.0 - 8.0   Glucose, UA NEGATIVE  NEGATIVE mg/dL   Hgb urine dipstick TRACE (*) NEGATIVE   Bilirubin Urine NEGATIVE  NEGATIVE   Ketones, ur NEGATIVE  NEGATIVE mg/dL   Protein, ur 100 (*) NEGATIVE mg/dL   Urobilinogen, UA 1.0  0.0 - 1.0 mg/dL   Nitrite NEGATIVE  NEGATIVE   Leukocytes, UA LARGE (*) NEGATIVE  URINE MICROSCOPIC-ADD ON     Status: Abnormal   Collection Time    01/03/14  2:00 PM      Result Value Ref Range   Squamous Epithelial / LPF RARE  RARE   WBC, UA TOO NUMEROUS TO COUNT  <3 WBC/hpf   RBC / HPF 7-10  <3 RBC/hpf   Bacteria, UA RARE  RARE   Casts HYALINE CASTS (*) NEGATIVE   Comment: GRANULAR CAST  MRSA PCR SCREENING     Status: Abnormal   Collection Time    01/03/14  8:40 PM      Result Value Ref Range   MRSA by PCR POSITIVE (*) NEGATIVE   Comment:            The GeneXpert MRSA Assay (FDA     approved for NASAL specimens     only), is one component of a     comprehensive MRSA colonization     surveillance program. It is not     intended to diagnose MRSA     infection nor to guide or     monitor treatment for     MRSA infections.     RESULT  CALLED TO, READ BACK BY AND VERIFIED WITH:     Lianne Moris RN 2157 01/03/14 A BROWNING  BASIC METABOLIC PANEL     Status: Abnormal   Collection Time    01/04/14  6:15 AM      Result Value Ref Range   Sodium 155 (*) 137 - 147 mEq/L   Potassium 4.2  3.7 - 5.3 mEq/L   Chloride 121 (*) 96 - 112 mEq/L   CO2 21  19 - 32 mEq/L   Glucose, Bld 168 (*) 70 - 99 mg/dL   BUN 44 (*) 6 - 23 mg/dL   Creatinine, Ser 0.68  0.50 - 1.10 mg/dL   Calcium 9.3  8.4 - 10.5 mg/dL   GFR calc non Af Amer 82 (*) >90 mL/min   GFR calc Af Amer >90  >90 mL/min   Comment: (NOTE)     The eGFR has been calculated using the CKD EPI equation.     This calculation has not been validated in all clinical situations.     eGFR's persistently <90 mL/min signify possible Chronic Kidney     Disease.   Anion gap 13  5 - 15  CBC     Status: Abnormal   Collection Time    01/04/14  6:15 AM      Result Value Ref Range   WBC 9.4  4.0 - 10.5 K/uL   RBC 3.92  3.87 - 5.11 MIL/uL   Hemoglobin 9.2 (*) 12.0 - 15.0 g/dL   HCT 30.5 (*) 36.0 - 46.0 %   MCV 77.8 (*) 78.0 - 100.0 fL   MCH 23.5 (*) 26.0 - 34.0 pg   MCHC 30.2  30.0 - 36.0 g/dL   RDW 18.0 (*) 11.5 - 15.5 %   Platelets 230  150 - 400 K/uL  BASIC METABOLIC PANEL     Status: Abnormal   Collection Time    01/04/14  3:50 PM      Result Value Ref Range   Sodium 152 (*) 137 - 147 mEq/L   Potassium 4.1  3.7 - 5.3 mEq/L   Chloride 116 (*) 96 - 112 mEq/L   CO2 21  19 - 32 mEq/L   Glucose, Bld 189 (*) 70 - 99 mg/dL   BUN 46 (*) 6 - 23 mg/dL   Creatinine, Ser 0.70  0.50 - 1.10 mg/dL   Calcium 9.4  8.4 - 10.5 mg/dL   GFR calc non Af Amer 81 (*) >90 mL/min   GFR calc Af Amer >90  >90 mL/min   Comment: (NOTE)     The eGFR has been calculated using the CKD EPI equation.     This calculation has not been validated in all clinical situations.     eGFR's persistently <90 mL/min signify possible Chronic Kidney     Disease.   Anion gap 15  5 - 15  BASIC METABOLIC PANEL      Status: Abnormal   Collection Time    01/05/14 12:10 AM      Result Value Ref Range   Sodium 154 (*) 137 - 147 mEq/L   Potassium 4.0  3.7 - 5.3 mEq/L   Chloride 119 (*) 96 - 112 mEq/L   CO2 23  19 - 32 mEq/L   Glucose, Bld 177 (*) 70 - 99 mg/dL   BUN 49 (*) 6 - 23 mg/dL   Creatinine, Ser 0.79  0.50 - 1.10 mg/dL   Calcium 9.1  8.4 - 10.5 mg/dL   GFR calc non Af Amer 78 (*) >90 mL/min   GFR calc Af Amer >90  >90 mL/min   Comment: (NOTE)     The eGFR has been calculated using the CKD EPI equation.     This calculation has not been validated in all clinical situations.     eGFR's persistently <90 mL/min signify possible Chronic Kidney     Disease.   Anion gap 12  5 - 15  BASIC METABOLIC PANEL     Status: Abnormal   Collection Time    01/05/14  5:00 AM      Result Value Ref Range   Sodium 152 (*) 137 - 147 mEq/L   Potassium 6.0 (*) 3.7 - 5.3 mEq/L   Comment: HEMOLYSIS AT THIS LEVEL MAY AFFECT RESULT   Chloride 119 (*) 96 - 112 mEq/L   CO2 19  19 - 32 mEq/L   Glucose, Bld 177 (*) 70 - 99 mg/dL   BUN 50 (*) 6 - 23 mg/dL   Creatinine, Ser 0.68  0.50 - 1.10 mg/dL   Calcium 9.0  8.4 - 10.5 mg/dL   GFR calc non Af Amer 82 (*) >90 mL/min   GFR calc Af Amer >90  >90 mL/min   Comment: (NOTE)     The eGFR has been calculated using the CKD EPI equation.     This calculation has not been validated in all clinical situations.     eGFR's persistently <90 mL/min signify possible Chronic Kidney     Disease.   Anion gap 14  5 - 15    MICRO: 10/21 blood cx pending IMAGING: Dg Chest Portable 1 View  01/03/2014   CLINICAL  DATA:  Fever and tachycardia  EXAM: PORTABLE CHEST - 1 VIEW  COMPARISON:  03/25/2013  FINDINGS: Cardiac enlargement. Pulmonary artery enlargement, stable. Negative for heart failure. Extensive mitral annular calcification.  Negative for pneumonia or effusion.  IMPRESSION: No active disease.   Electronically Signed   By: Franchot Gallo M.D.   On: 01/03/2014 14:26     Assessment/Plan: 79yo F with hx of CVA, SNF resident, bed-bound with stage 4 sacral decub ulcer and sacral osteomyelitis. - will check sed rate and crp - empiric coverage for sacral osteomyelitis would recommend ceftriaxone 2gm IV daily plus metronidazole 560m ORAL TID x 6 wk. Although patient is colonized with MRSA, it does not appear that she has had invasive infection from it. Must weigh the nephrotoxicity associated with vancomycin to benefit of treating osteomyelitis.  - agree with orthopedics that treatment is often prolonged and may not necessarily improve quality of life, possibly worsen since it places her at risk for getting cdifficile, since she has had it in the past.  If questions over the weekend, please [facility] [phone]

## 2014-01-05 NOTE — Progress Notes (Signed)
I have seen and examined this patient. I have discussed with Dr Alease Frame.  I agree with their findings and plans as documented in their progress note.  Febrile Illness - Afebrile - Presumed origin is chronic osteomyelitis of sacrum in sacral pressure ulcer type four.  - Recommendations from General Surgery and ID service and Joice nurse noted.  Hypernatremia - slowly improving serum sodium with Free Water infusion - Patient more alert with improvement in serum sodium - Eating with hand feeding of Dysphagia 1 diet with thin liquids.   - Plan:  Place PICC line for 6 weeks of IV Rocephin 2 gm daily plus Flagyl oral 500 mg three times a day.  PT Wound treatment Pulse Lavage with Suction and selective debridement with scalpel 6 times a week.   Wound Care dressing: Normal Saline packing and moist gauze to sacral ulcer to Sacral Stage IV Pressure Ulcer and Right Buttock Pressure Ulcer stage II. Continue Free Water infusion IV.  Continue hand feeding of Dysphagia 1 (puree) diet with thin liquids via cup at discharge   HCPOA agent requesting discharge to LTC facility other than [facility], if possible.     Attempted to contact patient's HCPOA agent, [name] to give update.  No answer at home or mobile phone. Left  Message.

## 2014-01-05 NOTE — H&P (Signed)
I have seen and examined this patient. I have discussed with Dr Lonny Prude.  I agree with their findings and plans as documented in their admission note.

## 2014-01-05 NOTE — Progress Notes (Signed)
Family Medicine Teaching Service Daily Progress Note Intern Pager: [phone]  Patient name: [name] Medical record number: [id number] Date of birth: Jul 28, 1934 Age: 79 y.o. Gender: female  Primary Care Provider: Vance Gather, MD Consultants: General Surgery Code Status: Full  Pt Overview and Major Events to Date:  10/21: admitted to hospital under family medicine care; met sepsis criteria. 10/22: General Surgery, Palliative Care, and Wound Care consulted  Assessment and Plan: [name] is a 79 y.o. female presenting with sepsis . PMH is significant for dementia, CVA, stage 4 decubitus ulcer, hypertension, vitamin D deficiency   # Sepsis: secondary to sacral decubitus ulcer/sacral osteomyelitis. Patient currently only with mild tachycardia and tachypnea. Blood pressure stable. Patient s/p ceftriaxone x1 in the ED and currently on vancomycin. Spoke with sister and she would like treatment and consult to Gen. surgery  - MRI to assess for osteomyelitis  - SLP eval - dysphagia 1 - Continue vancomycin; add imipenem (discontinue cefepime) - General Surgery Consult - appreciate the input - Palliative care consult - will see  # Stage 4 decubitus ulcer - wound consult  - continue home tramadol 86m TID  - Bone Bx necessary for pathogen - management as above   # Hypernatremia - sodium 160 on admission - D5-1/4 NS @ 1734mhr - Sodium 152 on 10/23 - will continue to follow  # Hyperkalemia - K+ 6.0 10/23 - Kayexalate 15g  # Hypertension  - continue clonidine   # CAD  - continue aggrenox   # Vitamin D deficiency  - continue calcium and vitamin D   FEN/GI: NPO until bedside swallow, continue supplements (after assessment)  Prophylaxis: heparin subq, miralax, zofran for nausea   Disposition: pending surgery and palliative consult.  Subjective:  Patient is lying in bed. She is noncommunicative. Does not appear to be in any acute distress. Was able to make facial expressions when  I communicated w/ her. Unsure of whether she understood what I was trying to tell her.   Objective: Temp:  [98 F (36.7 C)-98.9 F (37.2 C)] 98.9 F (37.2 C) (10/23 1336) Pulse Rate:  [91-99] 91 (10/23 1336) Resp:  [20] 20 (10/23 1336) BP: (134-153)/(81-85) 134/83 mmHg (10/23 1336) SpO2:  [92 %-99 %] 92 % (10/23 1336) Physical Exam: General: Laying in bed, non-verbal, does not appear in distress  Cardiovascular: Regular rate and rhythm, 3/6 crescendo-decrescendo murmur heard best at LUSB and holosystolic blowing murmur best heard at apex  Respiratory: Anterior auscultation. Clear to auscultation bilaterally  Abdomen: Soft, non-tender, non-distended  Extremities: no edema  Skin: large wound at sacral location with macerated skin at periphery. Entire wound is about 7cm x 7cm. sacral bone w/ obvious exposure. Foul odor present  Neuro: Alert, noncommunicative. Response to some stimuli.   Laboratory:  Recent Labs Lab 01/03/14 1304 01/04/14 0615  WBC 10.7* 9.4  HGB 10.2* 9.2*  HCT 33.7* 30.5*  PLT 286 230    Recent Labs Lab 01/04/14 1550 01/05/14 0010 01/05/14 0500  NA 152* 154* 152*  K 4.1 4.0 6.0*  CL 116* 119* 119*  CO2 21 23 19   BUN 46* 49* 50*  CREATININE 0.70 0.79 0.68  CALCIUM 9.4 9.1 9.0  GLUCOSE 189* 177* 177*    Imaging/Diagnostic Tests:   IaElberta LeatherwoodMD 01/05/2014, 2:56 PM PGY-1, [place] pager: [phone] pages welcome

## 2014-01-05 NOTE — Care Management Note (Unsigned)
    Page 1 of 1   01/08/2014     5:44:08 PM CARE MANAGEMENT NOTE 01/08/2014  Patient:  [name], [name]   Account Number:  [id number]  Date Initiated:  01/05/2014  Documentation initiated by:  Tomi Bamberger  Subjective/Objective Assessment:   dx tachycardia  admit- from [facility] with hospice.     Action/Plan:   picc placed 10/25   Anticipated DC Date:  01/06/2014   Anticipated DC Plan:  SKILLED NURSING FACILITY  In-house referral  Clinical Social Worker      DC Planning Services  CM consult      Choice offered to / List presented to:             Status of service:  In process, will continue to follow Medicare Important Message given?  YES (If response is "NO", the following Medicare IM given date fields will be blank) Date Medicare IM given:  01/05/2014 Medicare IM given by:  Tomi Bamberger Date Additional Medicare IM given:   Additional Medicare IM given by:    Discharge Disposition:    Per UR Regulation:  Reviewed for med. necessity/level of care/duration of stay  If discussed at [place] of Stay Meetings, dates discussed:    Comments:  01/08/14  [place], BSN [phone] CSW is following for dc back to snf.  01/05/14 [place], BSN [phone] patient is from [facility] with Hospice, CSW referral.

## 2014-01-05 NOTE — Clinical Social Work Placement (Signed)
Clinical Social Work Department CLINICAL SOCIAL WORK PLACEMENT NOTE 01/05/2014  Patient:  MIRANDA, FRESE  Account Number:  [id number] Admit date:  01/03/2014  Clinical Social Worker:  Lovey Newcomer  Date/time:  01/05/2014 03:29 PM  Clinical Social Work is seeking post-discharge placement for this patient at the following level of care:   [place]   (*CSW will update this form in Epic as items are completed)   01/05/2014  Patient/family provided with [place] Department of Clinical Social Work's list of facilities offering this level of care within the geographic area requested by the patient (or if unable, by the patient's family).  01/05/2014  Patient/family informed of their freedom to choose among providers that offer the needed level of care, that participate in Medicare, Medicaid or managed care program needed by the patient, have an available bed and are willing to accept the patient.  01/05/2014  Patient/family informed of [facility]' ownership interest in [facility] as well as of the fact that they are under no obligation to receive care at this facility.  PASARR submitted to EDS on 01/05/2014 PASARR number received on 01/05/2014  FL2 transmitted to all facilities in geographic area requested by pt/family on  01/05/2014 FL2 transmitted to all facilities within larger geographic area on   Patient informed that his/her managed care company has contracts with or will negotiate with  certain facilities, including the following:     Patient/family informed of bed offers received:   Patient chooses bed at  Physician recommends and patient chooses bed at    Patient to be transferred to  on   Patient to be transferred to facility by  Patient and family notified of transfer on  Name of family member notified:    The following physician request were entered in Epic:   Additional Comments:    Liz Beach MSW, [place], [place], [phone]

## 2014-01-05 NOTE — Progress Notes (Signed)
Physical Therapy Wound Treatment Patient Details  Name: YASSMINE TAMM MRN: [id number] Date of Birth: [birth year]  Today's Date: 01/05/2014 Time: 1000-1046 Time Calculation (min): 46 min  Subjective  Subjective: Hello.... Patient and Family Stated Goals: pt unable  Pain Score:    Wound Assessment  Pressure Ulcer Stage II -  Partial thickness loss of dermis presenting as a shallow open ulcer with a red, pink wound bed without slough. (Active)     Pressure Ulcer 01/20/13 Stage III -  Full thickness tissue loss. Subcutaneous fat may be visible but bone, tendon or muscle are NOT exposed. wound has evolved to 50% slough unstageable/50% red stage 3.  Was docuented as stage 3 on 11/9 (Active)     Pressure Ulcer 01/03/14 Stage IV - Full thickness tissue loss with exposed bone, tendon or muscle. 8X7.5CM;DEPTH=3CM;TUNNELING=2.5CM;UNDERMINING=2.5CM (Active)  Dressing Type ABD;Barrier Film (skin prep);Gauze (Comment);Moist to dry 01/05/2014 11:00 AM  Dressing Changed 01/05/2014 11:00 AM  Dressing Change Frequency Twice a day 01/05/2014 11:00 AM  State of Healing Early/partial granulation 01/05/2014 11:00 AM  Site / Wound Assessment Yellow;Brown 01/05/2014 11:00 AM  % Wound base Red or Granulating 80% 01/05/2014 11:00 AM  % Wound base Yellow 20% 01/05/2014 11:00 AM  % Wound base [place] 0% 01/05/2014 11:00 AM  % Wound base Other (Comment) 0% 01/05/2014 11:00 AM  Peri-wound Assessment Intact 01/05/2014 11:00 AM  Wound Length (cm) 7 cm 01/05/2014 11:00 AM  Wound Width (cm) 6 cm 01/05/2014 11:00 AM  Wound Depth (cm) 3 cm 01/05/2014 11:00 AM  Tunneling (cm) 2.5 01/03/2014  8:30 PM  Undermining (cm) 2 01/05/2014 11:00 AM  Margins Unattached edges (unapproximated) 01/05/2014 11:00 AM  Drainage Amount Moderate 01/05/2014 11:00 AM  Drainage Description Serosanguineous 01/05/2014 11:00 AM  Treatment Cleansed;Debridement (Selective);Hydrotherapy (Pulse lavage);Packing (Dry gauze) 01/05/2014 11:00 AM      Pressure Ulcer 01/03/14 Stage II -  Partial thickness loss of dermis presenting as a shallow open ulcer with a red, pink wound bed without slough. 2.5x 1.5cm (Active)  Dressing Type ABD;Barrier Film (skin prep) 01/05/2014 11:00 AM  Dressing Changed 01/05/2014 11:00 AM  Dressing Change Frequency Twice a day 01/05/2014 11:00 AM  State of Healing Other (Comment) 01/05/2014 11:00 AM  Site / Wound Assessment Clean;Yellow 01/05/2014 11:00 AM  % Wound base Red or Granulating 70% 01/05/2014 11:00 AM  % Wound base Yellow 30% 01/05/2014 11:00 AM  % Wound base [place] 0% 01/05/2014 11:00 AM  % Wound base Other (Comment) 0% 01/05/2014 11:00 AM  Peri-wound Assessment Intact 01/05/2014 11:00 AM  Wound Length (cm) 2 cm 01/04/2014 12:00 PM  Wound Width (cm) 3 cm 01/04/2014 12:00 PM  Wound Depth (cm) 0.2 cm 01/05/2014 11:00 AM  Drainage Amount Scant 01/05/2014 11:00 AM  Drainage Description Serosanguineous 01/05/2014 11:00 AM  Treatment Cleansed;Debridement (Selective);Hydrotherapy (Pulse lavage);Packing (Dry gauze) 01/05/2014 11:00 AM     Pressure Ulcer 01/03/14 Stage II -  Partial thickness loss of dermis presenting as a shallow open ulcer with a red, pink wound bed without slough. 7x2.5cm (Active)  Dressing Type Other (Comment) 01/05/2014  6:32 AM  Dressing Changed 01/05/2014  6:32 AM  Dressing Change Frequency Twice a day 01/04/2014  8:00 PM  State of Healing Other (Comment) 01/04/2014  8:00 PM  Site / Wound Assessment Pink 01/03/2014  8:30 PM  Wound Length (cm) 7 cm 01/03/2014  8:30 PM  Wound Width (cm) 2.5 cm 01/03/2014  8:30 PM  Drainage Amount Scant 01/04/2014  8:00 PM  Drainage Description Serosanguineous 01/04/2014  8:00 PM  Treatment Other (Comment) 01/04/2014  8:00 PM     Wound Contact dermatitis Abdomen Lower (Active)     Wound 02/15/13 Skin tear Thigh Right (Active)   Hydrotherapy Pulsed lavage therapy - wound location: sacrum Pulsed Lavage with Suction (psi): 4 psi (to 8) Pulsed  Lavage with Suction - Normal Saline Used: 1000 mL Pulsed Lavage Tip: Tip with splash shield Selective Debridement Selective Debridement - Location: sacrum Selective Debridement - Tools Used: Forceps;Scalpel Selective Debridement - Tissue Removed: yellow necrosis   Wound Assessment and Plan  Wound Therapy - Assess/Plan/Recommendations Wound Therapy - Clinical Statement: Will benefit from PLS and selective debridement to get wound to granulation, then nursing to continue wound management.  Healing likely to not occur readily due to lack of nutrition. Wound Therapy - Functional Problem List: bed bound status makes skin issues a problem Factors Delaying/Impairing Wound Healing: Altered sensation;Immobility;Infection - systemic/local Hydrotherapy Plan: Debridement;Dressing change;Patient/family education;Pulsatile lavage with suction Wound Therapy - Frequency: 6X / week Wound Therapy - Follow Up Recommendations: Skilled nursing facility Wound Plan: see [facility]  Wound Therapy Goals- Improve the function of patient's integumentary system by progressing the wound(s) through the phases of wound healing (inflammation - proliferation - remodeling) by: Decrease Necrotic Tissue to: 5 Decrease Necrotic Tissue - Progress: Goal set today Increase Granulation Tissue to: 95 (sacral wound) Increase Granulation Tissue - Progress: Goal set today Goals/treatment plan/discharge plan were made with and agreed upon by patient/family: No, Patient unable to participate in goals/treatment/discharge plan and family unavailable Time For Goal Achievement: 7 days Wound Therapy - Potential for Goals: Fair  Goals will be updated until maximal potential achieved or discharge criteria met.  Discharge criteria: when goals achieved, discharge from hospital, MD decision/surgical intervention, no progress towards goals, refusal/missing three consecutive treatments without notification or medical reason.  GP     Samie Barclift,  Tessie Fass 01/05/2014, 11:10 AM  01/05/2014  Donnella Sham, [place] [phone]  (pager)

## 2014-01-05 NOTE — Progress Notes (Signed)
MD made aware of patient's last bowel movement being prior to arrival. Pt nonverbal and unable to communicate date of last BM or constipation. Also made aware of potassium level of 6. Orders received.

## 2014-01-05 NOTE — Clinical Social Work Note (Signed)
CSW has attempted to assess patient again. No Family at bedside. Messages left with family.   Liz Beach MSW, [place], [place], [phone]

## 2014-01-05 NOTE — Progress Notes (Signed)
Speech Language Pathology Treatment: Dysphagia  Patient Details Name: [name] MRN: [id number] DOB: 23-Apr-1934 Today's Date: 01/05/2014 Time: 1478-2956 SLP Time Calculation (min): 10 min  Assessment / Plan / Recommendation Clinical Impression  F/u after yesterday's swallow evaluation: Pt being fed lunch by NT.  She continues to present with prolonged mastication, likely delayed swallow response, but no s/s of aspiration.  She is alert, appropriately anticipates approaching bolus.  Requires hand-feeding.  Will vocalize intermittently, but generally nonverbal due to chronic aphasia.  Recommend continuing this diet - dysphagia 1, thin liquids - upon D/C.  No further SLP f/u warranted.  Will sign off.    HPI HPI: [name] is a 79 y.o. female presenting with sepsis secondary to sacral decubitus ulcer. PMH is significant for dementia, CVA wtih expressive aphasia, stage 4 decubitus ulcer, hypertension, vitamin D deficiency, and dysphagia requiring Dys 1 textures and thin liquids without straws across multiple bedside swallow evaluations. CXR upon admission was clear of acute disease.   Pertinent Vitals Pain Assessment: Faces Faces Pain Scale: No hurt  SLP Plan  All goals met    Recommendations Diet recommendations: Dysphagia 1 (puree);Thin liquid Liquids provided via: Cup Medication Administration: Crushed with puree Supervision: Full supervision/cueing for compensatory strategies;Patient able to self feed Compensations: Slow rate;Small sips/bites;Check for pocketing;Check for anterior loss Postural Changes and/or Swallow Maneuvers: Seated upright 90 degrees              Oral Care Recommendations: Oral care BID Follow up Recommendations: Skilled Nursing facility;24 hour supervision/assistance Plan: All goals met   Twinkle Sockwell L. Tivis Ringer, [place] CCC/SLP Pager [phone]      Juan Quam [place]ice 01/05/2014, 1:51 PM

## 2014-01-05 NOTE — Progress Notes (Signed)
I have seen and examined this patient. I have discussed with Dr Alease Frame.  I agree with their findings and plans as documented in their progress note.

## 2014-01-05 NOTE — Progress Notes (Signed)
Inpatient Rm [place] 5W 10 [facility] RN Visit  Related admission to [facility] Dx: Unspecified Cerebrovascular disease; pt DNR Code status GOLD DNR Form accompanied pt to the hospital  Pt seen at bedside, no family present; eyes closed, awoke to voice; alert, eyes tracking writer; pt grimacing-writer asked if she was hurting and pt responded 'yes'; when told I would let her nurse know to bring her something pt replied 'OK'  Offered to do mouth care and provide something to drink and pt relied 'yes' pt co-operative with mouth care;   L sided facial droop r/t prior CVA; when head of bed raised pt continued to grimace- took 2 swallows of water and 2 swallows of Ensure without difficulty; lunch tray at bedside, noted only minimal PO taken   -staff RN Apolonio Schneiders aware of grimacing and pt affirmation of pain; scheduled Tramadol due; Apolonio Schneiders RN informed pt received 2 mg IV Morphine prior to wound hydrotherapy this morning; pt squeezed writer's hand with L hand no movement on R side noted; per discussion with staff RN Apolonio Schneiders pt more alert today than yesterday [facility] Medication list, transfer summary in shadow chart; [facility] team continues to follow during current hospitalization; please call [phone] with any questions/concerns.  Danton Sewer, RN MSN, [facility] Liaison  [phone]

## 2014-01-05 NOTE — Progress Notes (Signed)
Patient was in [place]. She shook her head "no" when asked if she was having any pain or discomfort. SW observed green residue in the corner of her mouth.Patient seemed alert and she gave eye contact, track SW in room and shook her head ye/no in response to questions. No family was present at the bedside. SW was present when the doctor cam in for rounds. Patient is scheduled to have her wound evaluated by wound team. [facility] to follow patient during current hospitalization. SW will follow-up with patient's sister Stanton Kidney) to provide support to her. Patient was being followed at [facility] by hospice team. Please call [facility] [phone]) with any questions, concerns or changes in patient's care.   Monica Lonon,LCSW [phone] cell)

## 2014-01-05 NOTE — Clinical Social Work Psychosocial (Signed)
Clinical Social Work Department BRIEF PSYCHOSOCIAL ASSESSMENT 01/05/2014  Patient:  [name], [name]     Account Number:  [id number]     Admit date:  01/03/2014  Clinical Social Worker:  Lovey Newcomer  Date/Time:  01/05/2014 03:00 PM  Referred by:  Physician  Date Referred:  01/05/2014 Referred for  SNF Placement   Other Referral:   NA   Interview type:  Family Other interview type:   Patient unable to contribute to assessment. Sister [name] interviewed.    PSYCHOSOCIAL DATA Living Status:  FACILITY Admitted from facility:  [place] Level of care:  [place] Primary support name:  [name] Primary support relationship to patient:  [place] Degree of support available:   Support is good.    CURRENT CONCERNS Current Concerns  Post-Acute Placement   Other Concerns:   NA    SOCIAL WORK ASSESSMENT / PLAN CSW spoke with sister [name] by phone to complete assessment. [name] states that the patient was admitted from [facility] where she is a long term resident. [name] states that she is very dissappointed in the care the patient receives at the facility and would be interested in looking into what other long term care options may be available for the patient. CSW explained SNF search/placement process and answered questions. Patient's daughter seems unhappy with the care the patient is receiving at the facility.   Assessment/plan status:  Psychosocial Support/Ongoing Assessment of Needs Other assessment/ plan:   Complete FL2, Fax, PASRR   Information/referral to community resources:   CSW contact information and SNF list with bed offers will be given.    PATIENT'S/FAMILY'S RESPONSE TO PLAN OF CARE: Patient's sister plans for the patient to DC to SNF when ready, and would like to look into other LTC SNF options for patient. [name] understands that the patient will be expected to DC back to [facility] if no other LTC SNFs can be found.        Liz Beach MSW, [place], [place], [phone]

## 2014-01-05 NOTE — Progress Notes (Addendum)
Palliative medicine consult received. Current [facility] patient. PMT has seen this patient x2 on prior admissions.   1. DNR  2. This wound will likely never heal based on Lakisha's debility, multiple chronic medical problems and irreversible immobility. She is currently under hospice care and goals in the past have been established for comfort. I do not recommend "Pulse Lavage with Suction and selective debridement with scalpel 6 times a week" as a palliative approach and is probably not in line with a hospice philosophy unless there is significant pre-medication and a clear goal of wound healing and QOL improvement. Consider Keeping wound covered with a clean drg, control odor with Dakins solution is needed and schedule pain medication and pre-medicate before drg changes.  3. Family has stated that their main goal is to get her out of [facility] SNF which has not happened- they have expressed a strong desire for a different facility and I suspect this is why they are agreeable to readmissions even though we established readmission prevention as a goal of care in previous PMT discussions. Strongly recommend finding her a a new LTC.  4. Pain: She is on scheduled tramadol- which can cause nausea and she has needed 2-3 doses of IV morphine for pain. Recommend discontinuing tramadol and using Roxanol scheduled SL dosing and leave the IV morphine for Break through pain.  5. Prophylaxis: Senna-S QHS and PRN dulcolax supp for constipation  6. Minimize lab sticks/draws.  7. Multivitamin liquids and supplements are also likely unnecessary.  Will follow for any additional symptom management needs- will continue to attempt to get in touch with family and determine the appropriate level of intervention based on Ziya's wishes for her EOL care.   Lane Hacker, DO Palliative Medicine [phone]

## 2014-01-05 NOTE — Telephone Encounter (Signed)
No answer.  Left message. Will attempt to give patient's sister update on surgery and Infectious Disease recommendations tomorrow.

## 2014-01-06 ENCOUNTER — Telehealth: Payer: Self-pay | Admitting: Family Medicine

## 2014-01-06 DIAGNOSIS — Z8673 Personal history of transient ischemic attack (TIA), and cerebral infarction without residual deficits: Secondary | ICD-10-CM

## 2014-01-06 LAB — BASIC METABOLIC PANEL
Anion gap: 12 (ref 5–15)
BUN: 49 mg/dL — ABNORMAL HIGH (ref 6–23)
CALCIUM: 8.8 mg/dL (ref 8.4–10.5)
CO2: 22 meq/L (ref 19–32)
CREATININE: 0.77 mg/dL (ref 0.50–1.10)
Chloride: 121 mEq/L — ABNORMAL HIGH (ref 96–112)
GFR calc Af Amer: >90 mL/min (ref >90)
GFR calc non Af Amer: 79 mL/min — ABNORMAL LOW (ref >90)
Glucose, Bld: 267 mg/dL — ABNORMAL HIGH (ref 70–99)
Potassium: 3.8 mEq/L (ref 3.7–5.3)
Sodium: 155 mEq/L — ABNORMAL HIGH (ref 137–147)

## 2014-01-06 MED ORDER — METRONIDAZOLE IN NACL 5-0.79 MG/ML-% IV SOLN
500.0000 mg | Freq: Four times a day (QID) | INTRAVENOUS | Status: DC
  Administered 2014-01-06 – 2014-01-10 (×17): 500 mg via INTRAVENOUS
  Filled 2014-01-06 (×20): qty 100

## 2014-01-06 MED ORDER — ONDANSETRON HCL 4 MG/2ML IJ SOLN: 4.0000 mg | Freq: Four times a day (QID) | INTRAMUSCULAR | Status: DC | PRN

## 2014-01-06 MED ORDER — ACETAMINOPHEN 650 MG RE SUPP: 650.0000 mg | Freq: Two times a day (BID) | RECTAL | Status: DC | PRN

## 2014-01-06 NOTE — Progress Notes (Signed)
Physical Therapy Wound Treatment and Discharge Patient Details  Name: [name] MRN: [id number] Date of Birth: May 12, 1934  Today's Date: 01/06/2014 Time: 6160-7371 Time Calculation (min): 34 min  Subjective  Patient and Family Stated Goals: pt unable  Pain Score:    Wound Assessment  Pressure Ulcer 01/03/14 Stage IV - Full thickness tissue loss with exposed bone, tendon or muscle. 8X7.5CM;DEPTH=3CM;TUNNELING=2.5CM;UNDERMINING=2.5CM (Active)  Dressing Type ABD;Barrier Film (skin prep);Gauze (Comment);Moist to dry 01/06/2014 11:58 AM  Dressing Changed 01/06/2014 11:58 AM  Dressing Change Frequency Twice a day 01/06/2014 11:58 AM  State of Healing Early/partial granulation 01/06/2014 11:58 AM  Site / Wound Assessment Yellow 01/06/2014 11:58 AM  % Wound base Red or Granulating 90% 01/06/2014 11:58 AM  % Wound base Yellow 10% 01/06/2014 11:58 AM  % Wound base [place] 0% 01/06/2014 11:58 AM  % Wound base Other (Comment) 0% 01/06/2014 11:58 AM  Peri-wound Assessment Intact 01/06/2014 11:58 AM  Wound Length (cm) 7 cm 01/05/2014 11:00 AM  Wound Width (cm) 6 cm 01/05/2014 11:00 AM  Wound Depth (cm) 3 cm 01/05/2014 11:00 AM  Tunneling (cm) 2.5 01/03/2014  8:30 PM  Undermining (cm) 2 01/05/2014 11:00 AM  Margins Unattached edges (unapproximated) 01/06/2014 11:58 AM  Drainage Amount Moderate 01/06/2014 11:58 AM  Drainage Description Serosanguineous 01/06/2014 11:58 AM  Treatment Cleansed;Debridement (Selective);Hydrotherapy (Pulse lavage);Packing (Saline gauze) 01/06/2014 11:58 AM     Pressure Ulcer 01/03/14 Stage II -  Partial thickness loss of dermis presenting as a shallow open ulcer with a red, pink wound bed without slough. 2.5x 1.5cm (Active)  Dressing Type ABD;Barrier Film (skin prep) 01/06/2014 11:58 AM  Dressing Changed 01/06/2014 11:58 AM  Dressing Change Frequency Twice a day 01/06/2014 11:58 AM  State of Healing Other (Comment) 01/06/2014 11:58 AM  Site / Wound Assessment  Clean;Yellow 01/06/2014 11:58 AM  % Wound base Red or Granulating 80% 01/06/2014 11:58 AM  % Wound base Yellow 20% 01/06/2014 11:58 AM  % Wound base [place] 0% 01/06/2014 11:58 AM  % Wound base Other (Comment) 0% 01/06/2014 11:58 AM  Peri-wound Assessment Intact 01/06/2014 11:58 AM  Wound Length (cm) 2 cm 01/04/2014 12:00 PM  Wound Width (cm) 3 cm 01/04/2014 12:00 PM  Wound Depth (cm) 0.2 cm 01/05/2014 11:00 AM  Drainage Amount Scant 01/06/2014 11:58 AM  Drainage Description Serosanguineous 01/06/2014 11:58 AM  Treatment Cleansed;Debridement (Selective);Hydrotherapy (Pulse lavage);Packing (Dry gauze) 01/05/2014 11:00 AM     Pressure Ulcer 01/03/14 Stage II -  Partial thickness loss of dermis presenting as a shallow open ulcer with a red, pink wound bed without slough. 7x2.5cm (Active)  Dressing Type ABD;Barrier Film (skin prep) 01/06/2014  8:10 AM  Dressing Clean;Dry;Intact 01/06/2014  8:10 AM  Dressing Change Frequency Twice a day 01/04/2014  8:00 PM  State of Healing Other (Comment) 01/04/2014  8:00 PM  Site / Wound Assessment Pink 01/03/2014  8:30 PM  Wound Length (cm) 7 cm 01/03/2014  8:30 PM  Wound Width (cm) 2.5 cm 01/03/2014  8:30 PM  Drainage Amount Scant 01/04/2014  8:00 PM  Drainage Description Serosanguineous 01/04/2014  8:00 PM  Treatment Other (Comment) 01/04/2014  8:00 PM   Hydrotherapy Pulsed lavage therapy - wound location: sacrum Pulsed Lavage with Suction (psi): 4 psi (to 8) Pulsed Lavage with Suction - Normal Saline Used: 1000 mL Pulsed Lavage Tip: Tip with splash shield Selective Debridement Selective Debridement - Location: sacrum Selective Debridement - Tools Used: Forceps;Scalpel Selective Debridement - Tissue Removed: yellow necrosis   Wound Assessment and Plan  Wound  Therapy - Assess/Plan/Recommendations Wound Therapy - Clinical Statement: Pt's wounds are as free from necrotic tissue as needed for nursing staff to manage easily with Santyl.  Also these  wounds are in maintenance phase and likely not to improve given her status and nutrition.  Will sign off at this time and let nursing continue management Wound Therapy - Functional Problem List: bed bound status makes skin issues a problem Factors Delaying/Impairing Wound Healing: Altered sensation;Immobility;Infection - systemic/local Hydrotherapy Plan: Debridement;Dressing change;Patient/family education;Pulsatile lavage with suction Wound Therapy - Frequency: 6X / week Wound Therapy - Follow Up Recommendations: Skilled nursing facility Wound Plan: see [facility]  Wound Therapy Goals- Improve the function of patient's integumentary system by progressing the wound(s) through the phases of wound healing (inflammation - proliferation - remodeling) by: Decrease Necrotic Tissue to: 5 Decrease Necrotic Tissue - Progress: Partly met Increase Granulation Tissue to: 95 (sacral wound) Increase Granulation Tissue - Progress: Partly met Goals/treatment plan/discharge plan were made with and agreed upon by patient/family: No, Patient unable to participate in goals/treatment/discharge plan and family unavailable Time For Goal Achievement: 7 days Wound Therapy - Potential for Goals: Fair  Goals will be updated until maximal potential achieved or discharge criteria met.  Discharge criteria: when goals achieved, discharge from hospital, MD decision/surgical intervention, no progress towards goals, refusal/missing three consecutive treatments without notification or medical reason.  GP     Deyonna Fitzsimmons, Tessie Fass 01/06/2014, 12:05 PM  01/06/2014  Donnella Sham, [place] [phone]  (pager)

## 2014-01-06 NOTE — Progress Notes (Signed)
GIP Visit Rm [place] 5W 10 J[facility]ell [facility] and Palliative Care of Carla Salm RN    Related admission to [facility] Dx: Unspecified Cerebrovascular disease; pt DNR Code status. Spoke with nurse Thelma patient is unable to swallow any fluids and is uanble to take pills today. Patient will need SQ/IV medications. Notified Dr. Hilma Favors about patients change and made suggestions to the nurse. No family at bedside.   please call [place] with any questions/concerns. Ardath Sax RN

## 2014-01-06 NOTE — Progress Notes (Signed)
I have seen and examined this patient. I have discussed with Dr Alease Frame.  I agree with their findings and plans as documented in their progress note.  I spoke to patient's HCPOA agent, [name] today.  She knows about recommendations from ID for 6 weeks of IV rocephin and oral flagyl.  She is willing to have a PICC line placed for IV antibiotic access for treatment of the possible sacral osteomyelitis.   She desires disposition of her sister to a nursing home other than [facility] at discharge.

## 2014-01-06 NOTE — Telephone Encounter (Signed)
I spoke to [facility], [name].  She knows about recommendations from ID for 6 weeks of V rocephin and oral flagyl. She is willing to have a PICC line placed for IV antibiotic access for treatment of the possible sacral osteomyelitis.  She desires disposition of her sister to a nursing home other than [facility] at discharge.

## 2014-01-06 NOTE — Progress Notes (Signed)
Family Medicine Teaching Service Daily Progress Note Intern Pager: [phone]  Patient name: ARYANA WONNACOTT Medical record number: [id number] Date of birth: 1934-09-17 Age: 79 y.o. Gender: female  Primary Care Provider: Vance Gather, MD Consultants: General Surgery Code Status: Full  Pt Overview and Major Events to Date:  10/21: admitted to hospital under family medicine care; met sepsis criteria. 10/22: General Surgery, Palliative Care, and Wound Care consulted  Assessment and Plan: NIKA YAZZIE is a 79 y.o. female presenting with sepsis . PMH is significant for dementia, CVA, stage 4 decubitus ulcer, hypertension, vitamin D deficiency   # Sepsis: secondary to sacral decubitus ulcer/sacral osteomyelitis. Patient currently only with mild tachycardia and tachypnea. Blood pressure stable. Patient s/p ceftriaxone x1 in the ED and currently on vancomycin. Spoke with sister and she would like treatment and consult to Gen. surgery  - MRI to assess for osteomyelitis  - SLP eval - dysphagia 1 - Ceftriaxone and Flagyl - General Surgery Consult - appreciate the input - Palliative care consult  - DC Tramadol; start Roxanol; seek new LTC facility  # Stage 4 decubitus ulcer - wound care on board - Discontinue home tramadol; Start Roxanol - management as above   # Hypernatremia - sodium 160 on admission - D5-1/4 NS @ 115m/hr - Sodium 155 10/24 - will continue to follow  # Hyperkalemia - K+ 6.0 10/23 - Kayexalate 15g  # Hypertension  - continue clonidine   # CAD  - continue aggrenox   # Vitamin D deficiency  - continue calcium and vitamin D   FEN/GI: NPO until bedside swallow, continue supplements (after assessment)  Prophylaxis: heparin subq, miralax, zofran for nausea   Disposition: pending surgery and palliative consult.  Subjective:  Patient is lying in bed. She was able to communicate w/ me today. She stated she wants [place] do [place] we can. When asked she said that she'd like  to be a Full Code, however she was unable to tell me the year, season of the year, or her name.  She also complained of pain from the ulcer.    Objective: Temp:  [98.4 F (36.9 C)-98.9 F (37.2 C)] 98.7 F (37.1 C) (10/24 0518) Pulse Rate:  [85-96] 85 (10/24 0518) Resp:  [20] 20 (10/24 0518) BP: (123-144)/(70-83) 140/80 mmHg (10/24 1052) SpO2:  [92 %-99 %] 99 % (10/24 0518) Physical Exam: General: Laying in bed, in pain, Oriented x0, answers questions appropriately. Cardiovascular: Regular rate and rhythm, 3/6 crescendo-decrescendo murmur heard best at LUSB and holosystolic blowing murmur best heard at apex  Respiratory: Anterior auscultation. Clear to auscultation bilaterally  Abdomen: Soft, non-tender, non-distended  Extremities: no edema  Skin: large wound at sacral location with macerated skin at periphery. Entire wound is about 7cm x 7cm. sacral bone w/ obvious exposure. Foul odor present  Neuro: Alert,Oriented x0, answers questions appropriately.   Laboratory:  Recent Labs Lab 01/03/14 1304 01/04/14 0615  WBC 10.7* 9.4  HGB 10.2* 9.2*  HCT 33.7* 30.5*  PLT 286 230    Recent Labs Lab 01/05/14 0500 01/05/14 1527 01/05/14 2342  NA 152* 155* 155*  K 6.0* 4.0 3.8  CL 119* 119* 121*  CO2 19 22 22   BUN 50* 47* 49*  CREATININE 0.68 0.73 0.77  CALCIUM 9.0 9.3 8.8  GLUCOSE 177* 189* 267*    Imaging/Diagnostic Tests:   IElberta Leatherwood MD 01/06/2014, 12:37 PM PGY-1, [place] pager: [phone] text pages welcome

## 2014-01-07 DIAGNOSIS — F0150 Vascular dementia without behavioral disturbance: Secondary | ICD-10-CM

## 2014-01-07 LAB — COMPREHENSIVE METABOLIC PANEL
ALT: 68 U/L — AB (ref 0–35)
AST: 27 U/L (ref 0–37)
Albumin: 2.1 g/dL — ABNORMAL LOW (ref 3.5–5.2)
Alkaline Phosphatase: 70 U/L (ref 39–117)
Anion gap: 14 (ref 5–15)
BUN: 39 mg/dL — ABNORMAL HIGH (ref 6–23)
CALCIUM: 8.7 mg/dL (ref 8.4–10.5)
CO2: 20 meq/L (ref 19–32)
Chloride: 117 mEq/L — ABNORMAL HIGH (ref 96–112)
Creatinine, Ser: 0.66 mg/dL (ref 0.50–1.10)
GFR calc non Af Amer: 83 mL/min — ABNORMAL LOW (ref >90)
Glucose, Bld: 183 mg/dL — ABNORMAL HIGH (ref 70–99)
Potassium: 3.9 mEq/L (ref 3.7–5.3)
Sodium: 151 mEq/L — ABNORMAL HIGH (ref 137–147)
Total Protein: 6.6 g/dL (ref 6.0–8.3)

## 2014-01-07 MED ORDER — SODIUM CHLORIDE 0.9 % IJ SOLN
10.0000 mL | INTRAMUSCULAR | Status: DC | PRN
  Administered 2014-01-08: 10 mL

## 2014-01-07 MED ORDER — SODIUM CHLORIDE 0.9 % IJ SOLN: 10.0000 mL | Freq: Two times a day (BID) | INTRAMUSCULAR | Status: DC

## 2014-01-07 NOTE — Consult Note (Signed)
Palliative Medicine Team Progress Note  I had extensive conversation with [name] at bedside regarding [name]'s condition and options for care. What matters most to [name] and to [name] is that she is kept clean, she is carefully hand fed, repositioned and has some dignity as she approaches EOL. [name] said, "I don't want her to go out of this world like this-I want her cared for and for her not to suffer". [name] has her own severe back issues and cannot provide the care herself. I discussed [name] place specifically in the context of EOL care and in the interest of not prolonging [name]'s suffering. I explained the focus would be on everything that she mentioned was important to her-comfort and dignity. I also explained that they would not administer long term IV antibiotics but would provide exceptional care including wound care, and treat pain. [name] endorsed pain while I was in the room and [name] also was aware of her discomfort. I have requested Hospice RN contact [facility] CSW to assist with this transition. [name] did not feel that Hospice care being provided in the facility was sufficient for [name]'s comfort and says that she had not had heard from them in weeks. I provided reassurance that the Hospice Team would provide a very high level of comfort and care for [name] as she approaches EOL.  Will follow.  Carla Hacker, DO Palliative Medicine

## 2014-01-07 NOTE — Progress Notes (Addendum)
Family Medicine Teaching Service Daily Progress Note Intern Pager: [phone]  Patient name: [name] Medical record number: [id number] Date of birth: Nov 07, 1934 Age: 79 y.o. Gender: female  Primary Care Provider: Vance Gather, MD Consultants: General Surgery Code Status: Full  Pt Overview and Major Events to Date:  10/21: admitted to hospital under family medicine care; met sepsis criteria. 10/22: General Surgery, Palliative Care, and Wound Care consulted 10/26: waiting for Family to distinguish level and goals of care. Will DC when this has been clearly stated by family.  Assessment and Plan: [name] is a 79 y.o. female presenting with sepsis . PMH is significant for dementia, CVA, stage 4 decubitus ulcer, hypertension, vitamin D deficiency   # Sepsis 2/2 sacral decubitus ulcer (stage 4)/sacral osteomyelitis. Sister would like treatment with antibiotics - Continue IV Ceftriaxone and Flagyl - PICC line in place - General Surgery Consult - appreciate the input - Palliative care consult - appreciate recs - continue Roxanol sched and IV morphine for breakthrough - Wound care following  # Palliative Care - Hospice patient prior to admission.  Goals established for comfort. - Palliative care and Hospice following - appreciate recs - Pain control as above - Work towards placement at SNF other than [facility] - Senna-S QHS and PRN dulcolax supp for constipation - Minimize lab draws/sticks - SLP eval - dysphagia 1  # Hypernatremia - sodium 160 > 155 > 151 - D5-1/2NS @ MIVF - will continue to follow  # Hyperkalemia - Resolved. K+ 6.0 10/23 s/p Kayexalate 15g  # Hypertension - continue clonidine   # CAD - continue aggrenox   # Vitamin D deficiency - continue calcium and vitamin D   FEN/GI: dysphagia 1, continue supplements (after assessment)  Prophylaxis: heparin subq, miralax, zofran for nausea  Disposition: pending SNF placement.  Subjective:  Patient is lying in bed, non  verbal. Answers questions correctly w/ nodding yes and shaking head no.   Objective: Temp:  [98.1 F (36.7 C)-98.8 F (37.1 C)] 98.1 F (36.7 C) (10/26 1428) Pulse Rate:  [65-85] 65 (10/26 1428) Resp:  [16-18] 16 (10/26 1428) BP: (107-144)/(60-80) 107/60 mmHg (10/26 1428) SpO2:  [100 %] 100 % (10/26 1428) Physical Exam: General: Laying in bed, nonverbal. NAD Cardiovascular: RRR, 3/6 crescendo-decrescendo murmur heard best at LUSB and holosystolic blowing murmur best heard at apex  Respiratory: Clear to auscultation bilaterally in anterior lung fields Abdomen: Soft, non-tender, non-distended  Extremities: no edema  Skin: large wound at sacral location with macerated skin at periphery. Entire wound is about 7cm x 7cm. sacral bone w/ obvious exposure. Foul odor present. Also: soft 2x3cm lesion noted on right breast at 2:00 position. Lesion is not open. Was TTP Neuro: Alert,Oriented x0, answers questions appropriately.   Laboratory:  Recent Labs Lab 01/03/14 1304 01/04/14 0615  WBC 10.7* 9.4  HGB 10.2* 9.2*  HCT 33.7* 30.5*  PLT 286 230    Recent Labs Lab 01/05/14 2342 01/07/14 0553 01/08/14 0444  NA 155* 151* 147  K 3.8 3.9 3.7  CL 121* 117* 113*  CO2 22 20 23   BUN 49* 39* 33*  CREATININE 0.77 0.66 0.69  CALCIUM 8.8 8.7 8.5  PROT  --  6.6  --   BILITOT  --  <0.2*  --   ALKPHOS  --  70  --   ALT  --  68*  --   AST  --  27  --   GLUCOSE 267* 183* 267*    Imaging/Diagnostic Tests:  Elberta Leatherwood, MD 01/08/2014, 4:45 PM PGY-1, [place] Intern pager: [phone], text pages welcome

## 2014-01-07 NOTE — Progress Notes (Signed)
Palliative Medicine Team Progress Note  Left message for HCPOA [name] fo return call. [name] is not taking in sufficient amounts of nutrition, pocketing food and more lethargic today. She has required several doses of IV Morphine in addition to her scheduled Roxanol for pain. Will continue to try to balance needs for comfort and pain control with maintaining her mental status -but she is extremely frail and week, At this point I am recommending that we consider a hospice facility for her care- I am not sure 6 weeks of IV antibiotics would be contributing to her comfort and well being and may actually lead to more complications(diarrhea, immobility, drug resistance, nausea)- including limiting her LTC options.  Will again try to speak with [name]- I suspect [name] is approaching EOL and we should consider [facility] for management of her pain, agitation and wound care at EOL.  Will continue to follow.  Lane Hacker, DO Palliative Medicine

## 2014-01-07 NOTE — Progress Notes (Signed)
I have seen and examined this patient. I have discussed with Dr Brita Romp.  I agree with their findings and plans as documented in their progress note. Plan to place PICC line and continue search for LTC bed.

## 2014-01-07 NOTE — Progress Notes (Signed)
Peripherally Inserted Central Catheter/Midline Placement  The IV Nurse has discussed with the patient and/or persons authorized to consent for the patient, the purpose of this procedure and the potential benefits and risks involved with this procedure.  The benefits include less needle sticks, lab draws from the catheter and patient may be discharged home with the catheter.  Risks include, but not limited to, infection, bleeding, blood clot (thrombus formation), and puncture of an artery; nerve damage and irregular heat beat.  Alternatives to this procedure were also discussed.  PICC/Midline Placement Documentation  PICC / Midline Single Lumen 01/07/14 PICC Right Cephalic 44 cm 1 cm (Active)  Indication for Insertion or Continuance of Line Home intravenous therapies (PICC only) 01/07/2014  3:38 PM  Exposed Catheter (cm) 1 cm 01/07/2014  3:38 PM  Line Status Flushed;Saline locked;Blood return noted 01/07/2014  3:38 PM  Dressing Change Due 01/14/14 01/07/2014  3:38 PM       Gordan Payment 01/07/2014, 3:40 PM

## 2014-01-07 NOTE — Progress Notes (Signed)
GIP visit Rm [place] and [facility] RN   .  Related admission to [facility] Dx: Unspecified Cerebrovascular disease; pt DNR Code status. Patient is unresponsive, spoke with nurse Phineas Semen she states that patient is about the same as yesterday unable to take POs. Sister Stanton Kidney arrived and stated that she thinks that staff is just not taking the time with patient because she eats fine for her. Suggested that sister go try to give patient something and she how she does for her. Also informed mary that Dr. Hilma Favors wants to speak with her about options of care. Dr. Hilma Favors notified that Stanton Kidney is at patients bedside. Patient is taking in POs since sister has arrived, Dr. Hilma Favors notified.  please call [place] with any questions/concerns. Ardath Sax RN

## 2014-01-07 NOTE — Progress Notes (Signed)
CSW Intern attempted to update p/t on bed offers. P/t was unresponsive and gave a blank stare. P/t's sister nor any other family/friends were present. Unit CSW will follow up.

## 2014-01-08 DIAGNOSIS — L8994 Pressure ulcer of unspecified site, stage 4: Secondary | ICD-10-CM

## 2014-01-08 LAB — BASIC METABOLIC PANEL WITH GFR
Anion gap: 11 (ref 5–15)
BUN: 33 mg/dL — ABNORMAL HIGH (ref 6–23)
CO2: 23 meq/L (ref 19–32)
Calcium: 8.5 mg/dL (ref 8.4–10.5)
Chloride: 113 meq/L — ABNORMAL HIGH (ref 96–112)
Creatinine, Ser: 0.69 mg/dL (ref 0.50–1.10)
GFR calc Af Amer: >90 mL/min
GFR calc non Af Amer: 82 mL/min — ABNORMAL LOW
Glucose, Bld: 267 mg/dL — ABNORMAL HIGH (ref 70–99)
Potassium: 3.7 meq/L (ref 3.7–5.3)
Sodium: 147 meq/L (ref 137–147)

## 2014-01-08 MED ORDER — DEXTROSE-NACL 5-0.45 % IV SOLN
INTRAVENOUS | Status: DC
  Administered 2014-01-08: 125 mL/h via INTRAVENOUS
  Administered 2014-01-09 – 2014-01-10 (×3): via INTRAVENOUS

## 2014-01-08 NOTE — Progress Notes (Signed)
Palliative Medicine Team Progress Note  Spoke with [name] at length today re: goals of care. I am not sure that it is reasonable to do 6 weeks of IV antibiotics for a wound that will probably never heal given her debility, immobility, chronic medical problems and nutritional state. I am also not certain that IV antibiotics would contribute to pain relief and may actually prolong suffering. I asked Stanton Kidney to again consider what [name]'s wishes would be given her condition regarding prolonging her life with antibiotics and wound scraping vs a shift to full comfort care. I asked Stanton Kidney to think about what is a good day for [name] and how frequent are good days compared to bad days- she insists that her sister is in this condition because of the poor care she has received. Stanton Kidney wants to discuss this with her other family members and wants to seriously consider full comfort as an option- she doesn't want to lose her sister but she also tells me she does not want her to suffer- I recommend continuing scheduled pain medications if she goes to SNF and using a PRN prior to wound care.Eligibility for [facility] is per the hospice team. I discussed [name]'s care in detail with Dr. Earlie Counts with [facility].   Will follow to discharge  Time: 25 minutes 3:45-4:10 Greater than 50%  of this time was spent counseling and coordinating care related to the above assessment and plan.   Lane Hacker, DO Palliative Medicine [phone]

## 2014-01-08 NOTE — Clinical Social Work Note (Signed)
CSW attempted to leave message with sister regarding available bed offers for patient. At this time the patient's only bed offer is from [facility]. CSW unable to leave message due to [facility] voicemail being "full". CSW will attempt to reach [facility] again.   Liz Beach MSW, [place], [place], [phone]

## 2014-01-08 NOTE — Progress Notes (Signed)
FMTS Attending Note Patient seen and examined by me today, discussed care with resident team and I agree with their plan.  Patient is comfortable-appearing and alert at the time of my interview. Answers questions with nods.  Appreciate the Palliative Care team's help in establishing Goals of Care, especially as they relate to the intent of long-term antibiotics for chronic sacral decubitus infection with osteomyelitis.  Dalbert Mayotte, MD

## 2014-01-08 NOTE — Progress Notes (Signed)
FMTS Attending NOte Patient seen and examined by me, discussed with resident team and I agree with Dr Eyvonne Mechanic note for today.  Dalbert Mayotte, MD

## 2014-01-08 NOTE — Progress Notes (Signed)
Patient was in bed, awake. She tracked Education officer, museum in the room and nodded yes/no to questions asked of her. She shook her head "no"  When asked if she was having any pain or discomfort. No family was as the bedside. SW consulted with the floor nurse, Joellen Jersey, RN. She advised that patient ate most of her breakfast and her pain was currently being managed.  Prior to visit, SW (& LTC Asst. Director, D. Elberta Fortis) spoke to patient's sister Stanton Kidney to clarify her goals for patient's care. [name] expressed dissatisfaction with facility [facility]) care and wanted her sister to be placed at new facility. When asked is she had particular facility in mind she only stated "somewhere where she could get better care".  A placement at [facility] was discussed and she did not seem too interested in that placement as she felt her sister would not thrive there among so many severely ill persons. [name] expressed her desire for her sister to be treated with antibiotics. Education was provided to [facility] regarding [employer] as it related to placement criteria. Stanton Kidney was advised that patient was not appropriate for [facility] as she was 1) not at end of life, 2) while patient is receiving antibiotic treatment for 6 weeks 3) and if patient was sent for symptom management, it would only be a few days and she would still need long-term placement afterwards. Stanton Kidney verbalized an understanding of education provided. She advised [place]  that the hospital SW, Marshell Levan was working on trying to locate another facility for patient and she would wait for follow-up from him.  The hospice LTC team, hospital liaison Mellody Memos), BP Director and hospital SW were all consulted and updated on current case status and efforts made toward patient's post discharge disposition.     Monica Lonon, LCSW ([phone]-cell)

## 2014-01-08 NOTE — Progress Notes (Signed)
Inpatient Rm [facility] 5W 10 [facility] RN Visit  Related admission to [facility] Dx: Unspecified Cerebrovascular disease; pt DNR Code status GOLD DNR Form accompanied pt to the hospital  Pt seen at bedside, no family present; awake tracks with eyes brows furrowed; when asked if she was hurting,pt  acknowledged pain by slight nod and mouthing 'yes'; attempted mouth care pt initially biting on mouth swab, then was co-operative; Dr Alease Frame in to see pt, she also responded affirmatively to his questions regarding pain- multiple sacral presure areas noted - an air overlay mattress is in place, per notes hydrotherapy stopped, wound now treated with Santyl dressing changes; R breast with raised non-draining, area noted covered with Foam dressing -Discussed pt with staff RN Joellen Jersey who had medicated pt with 2 mg PRN IV Morphine approximately 30 minutes prior to this visit and scheduled Concentrated liquid Morphine was started by PMT this weeked started on scheduled  Pt taking some POs when fed 30-45% although per discussion pt has been intermittently alert -wakefulness and response to taking POs has been hit or miss - Per chart review, PMT had discussion with pt's sister Stanton Kidney this weekend. family aware Ms Volland is declining and did discuss residential hospice, per discussion with Dr Alease Frame, Primary Team is continuing discussions with family regarding current treatment plan/disposition options- [facility] SW will also follow up with sister Stanton Kidney regarding disposition options. [facility] Medication list, transfer summary in shadow chart; [place] team continues to follow during current hospitalization; please call [phone] with any questions/concerns.  Danton Sewer, RN MSN [facility] Liaison  [phone]

## 2014-01-08 NOTE — Progress Notes (Signed)
Nutrition Brief Note  Chart reviewed. Pt now transitioning to comfort care.  No further nutrition interventions warranted at this time.  Please re-consult as needed.   Hue Steveson A. Jemima Petko, RD, LDN Pager: [phone] After hours Pager: [phone]  

## 2014-01-09 LAB — CULTURE, BLOOD (ROUTINE X 2)
Culture: NO GROWTH
Culture: NO GROWTH

## 2014-01-09 NOTE — Progress Notes (Signed)
Inpatient Rm [place] 5W 10 [facility] RN Visit  Related admission to [facility] Dx: Unspecified Cerebrovascular disease; pt DNR Code status GOLD DNR Form accompanied pt to the hospital  Pt seen at bedside, no family present; awake tracks with eyes; able to stick tongue out at writer's request to allow some mouth care on this visit; pt with intermittently grimacing and grunting expirations which occasionally looks like pt may be bearing down as if to have a BM; when asked if she is hurting pt has slight nod of head 'no' per chart review last BM on 10/26; per discussion with staff aide pt fed and ate all of oatmeal and orange sherbert, one bite of eggs at breakfast; discussed observed 'grunting' with staff RN Jenny Reichmann who is aware and will continue to monitor; pt due for scheduled PO Morphine   Per chart review pt continues on scheduled liquid concentrated Morphine TID and required additional PRN IV Morphine yesterday for PAINAD rating of 8/10 per staff RN charting (received 4 mg IV Morphine total in last 24 hours);  pt continues on IV Flagyl and Rocephin; note continued discussions with pt's sister regarding treatment plan and disposition  [facility] Medication list, transfer summary in shadow chart; [place] team continues to follow during current hospitalization; please call [phone] with any questions/concerns.  Danton Sewer, RN MSN [facility] Liaison  [phone]

## 2014-01-09 NOTE — Clinical Social Work Note (Signed)
CSW spoke with [name]. CSW informed Stanton Kidney that the patient has not received any other bed offers beyond [facility]. Stanton Kidney expresses frustration with this. Stanton Kidney states that she will call CSW back to make her decision. CSW explained that if the patient does not have any other bed offers then the patient will DC back to [facility].   Liz Beach MSW, [place], [place], [phone]

## 2014-01-09 NOTE — Progress Notes (Signed)
Family Medicine Teaching Service Daily Progress Note Intern Pager: [phone]  Patient name: [name] Medical record number: [id number] Date of birth: 1934-08-22 Age: 79 y.o. Gender: female  Primary Care Provider: Vance Gather, MD Consultants: General Surgery Code Status: Full  Pt Overview and Major Events to Date:  10/21: admitted to hospital under family medicine care; met sepsis criteria. 10/22: General Surgery, Palliative Care, and Wound Care consulted 10/26: waiting for Family to distinguish level and goals of care. Will DC when this has been clearly stated by family. 10/27: patients family has been contacted. Most likely dispo is [facility]. Will likely DC later today or tomorrow.  Assessment and Plan: [name] is a 79 y.o. female presenting with sepsis . PMH is significant for dementia, CVA, stage 4 decubitus ulcer, hypertension, vitamin D deficiency   # Sepsis 2/2 sacral decubitus ulcer (stage 4)/sacral osteomyelitis. Sister would like treatment with antibiotics - Continue IV Ceftriaxone and Flagyl - PICC line in place - General Surgery Consult - appreciate the input - Palliative care consult - appreciate recs - continue Roxanol sched and IV morphine for breakthrough - Wound care following  # Palliative Care - Hospice patient prior to admission.  Goals established for comfort. - Palliative care and Hospice following - appreciate recs - Pain control as above - Work towards placement at SNF other than [facility] - Senna-S QHS and PRN dulcolax supp for constipation - Minimize lab draws/sticks - SLP eval - dysphagia 1  # Hypernatremia - sodium 160 > 155 > 151 - D5-1/2NS @ [facility]F - will continue to follow  # Hyperkalemia - Resolved. K+ 6.0 10/23 s/p Kayexalate 15g  # Hypertension - continue clonidine   # CAD - continue aggrenox   # Vitamin D deficiency - continue calcium and vitamin D   FEN/GI: dysphagia 1, continue supplements (after assessment)  Prophylaxis: heparin  subq, miralax, zofran for nausea  Disposition: pending SNF placement.  Subjective:  Patient is lying in bed, non verbal. Answers questions correctly w/ nodding yes and shaking head no.   Objective: Temp:  [97.9 F (36.6 C)-98.2 F (36.8 C)] 97.9 F (36.6 C) (10/27 0655) Pulse Rate:  [65-83] 83 (10/27 0655) Resp:  [16-18] 18 (10/27 0655) BP: (107-120)/(60-80) 120/80 mmHg (10/27 0655) SpO2:  [100 %] 100 % (10/27 0655) Physical Exam: General: Laying in bed, nonverbal. NAD Cardiovascular: RRR, 3/6 crescendo-decrescendo murmur heard best at LUSB and holosystolic blowing murmur best heard at apex  Respiratory: Clear to auscultation bilaterally in anterior lung fields Abdomen: Soft, non-tender, non-distended  Extremities: no edema  Skin: large wound at sacral location with macerated skin at periphery. Entire wound is about 7cm x 7cm. sacral bone w/ obvious exposure. Foul odor present. Also: soft 2x3cm lesion noted on right breast at 2:00 position. Lesion is not open. Was TTP Neuro: Alert,Oriented x0, answers questions appropriately.   Laboratory:  Recent Labs Lab 01/03/14 1304 01/04/14 0615  WBC 10.7* 9.4  HGB 10.2* 9.2*  HCT 33.7* 30.5*  PLT 286 230    Recent Labs Lab 01/05/14 2342 01/07/14 0553 01/08/14 0444  NA 155* 151* 147  K 3.8 3.9 3.7  CL 121* 117* 113*  CO2 _0 BUN 49* 39* 33*  CREATININE 0.77 0.66 0.69  CALCIUM 8.8 8.7 8.5  PROT  --  6.6  --   BILITOT  --  <0.2*  --   ALKPHOS  --  70  --   ALT  --  68*  --  AST  --  27  --   GLUCOSE 267* 183* 267*    Imaging/Diagnostic Tests:   Elberta Leatherwood, MD 01/09/2014, 1:39 PM PGY-1, [place] [facility] Intern pager: [phone], text pages welcome

## 2014-01-09 NOTE — Clinical Social Work Note (Signed)
CSW has left message with sister in attempt to inform her that the patient is nearing discharge and to get her decision on SNF placement. Sister is aware that patient does not have any other SNF options at this time.   Liz Beach MSW, [place], [place], [phone]

## 2014-01-09 NOTE — Discharge Summary (Signed)
[facility] Discharge Summary  Patient name: [name] Medical record number: [id number] Date of birth: 03/28/1934 Age: 79 y.o. Gender: female Date of Admission: 01/03/2014  Date of Discharge: 01/10/2014  Admitting Physician: Willeen Niece, MD  Primary Care Provider: Vance Gather, MD Consultants: general surgery, wound care, palliative care, ID  Indication for Hospitalization: sepsis, sacral decubitus ulcer  Discharge Diagnoses/Problem List:  Sepsis Sacral decubitus ulcer stage IV Osteomyelitis, Sacrum Hypernatremia   Disposition: SNF  Discharge Condition: Stable  Discharge Exam:  General: Laying in bed, nonverbal. NAD  Cardiovascular: RRR, 3/6 crescendo-decrescendo murmur heard best at LUSB and holosystolic blowing murmur best heard at apex  Respiratory: Clear to auscultation bilaterally in anterior lung fields  Abdomen: Soft, non-tender, non-distended  Extremities: no edema  Skin: large wound at sacral location with macerated skin at periphery. Entire wound is about 7cm x 7cm. sacral bone w/ obvious exposure. Foul odor present. Also: soft 2x3cm lesion noted on right breast at 2:00 position. Lesion is not open. Was TTP  Neuro: Alert,Oriented x0, answers questions appropriately.   Brief Hospital Course:  Patient is a 79 year old female who presented with sepsis. She is a significant history of dementia, bilateral strokes, and a stage IV decubitus ulcer.  Prior to admission patient was receiving hospice services. She is found have a fever, low-grade, of 100.4. Physician at her facility started her on Cipro and clindamycin for the wound infection fever. He was also noted that she was tachypneic. From her facility she is transported to the [facility] emergency department. There they started her on vancomycin and ceftriaxone. She was later transported to the [facility].  Vancomycin was continued and cefepime was added. Consultation to Gen.  Surgery for possible debridement/bone biopsy was made. According to them they recommended regular hydrotherapy. Infectious disease was also consulted. They recommended ceftriaxone and Flagyl. They stated that they did not need a bone biopsy for pathogen identification, so no bone biopsy was obtained.   Palliative care was on board. They recommended Roxanol scheduled and IV morphine for breakthrough pain. There are also very helpful when communicating with family about patient's status.  Patient was noted to have hypernatremia on admission up to 160. Patient was placed on D5-1/4NS 175 ml/hour. This gradually corrected over the period of approximately 3 days. Upon correction patient was switched to D5-1/2NS at 174ml.hr.   There was significant difficulty when contacting patient's power of attorney, her sister. Contact was eventually made w/ sister. Agreement to DC to SNF care was made. Patient was [facility] to SNF w/ 5 day course of Bactrim and orders for a AP&P air mattress pad.    Issues for Follow Up:  - sacral decubitus ulcer - Airway - Pain - hypoalbuminemia  Significant Procedures: none  Significant Labs and Imaging:   Recent Labs Lab 01/04/14 0615  WBC 9.4  HGB 9.2*  HCT 30.5*  PLT 230    Recent Labs Lab 01/05/14 0500 01/05/14 1527 01/05/14 2342 01/07/14 0553 01/08/14 0444  NA 152* 155* 155* 151* 147  K 6.0* 4.0 3.8 3.9 3.7  CL 119* 119* 121* 117* 113*  CO2 19 22 22 20 23   GLUCOSE 177* 189* 267* 183* 267*  BUN 50* 47* 49* 39* 33*  CREATININE 0.68 0.73 0.77 0.66 0.69  CALCIUM 9.0 9.3 8.8 8.7 8.5  ALKPHOS  --   --   --  70  --   AST  --   --   --  27  --  ALT  --   --   --  68*  --   ALBUMIN  --   --   --  2.1*  --       Results/Tests Pending at Time of Discharge: none  Discharge Medications:    Medication List    STOP taking these medications       ciprofloxacin 500 MG tablet  Commonly known as:  CIPRO     clindamycin 300 MG capsule  Commonly known as:   CLEOCIN     traMADol 50 MG tablet  Commonly known as:  ULTRAM      TAKE these medications       acetaminophen 325 MG tablet  Commonly known as:  TYLENOL  Take 650 mg by mouth 2 (two) times daily as needed for mild pain.     acetaminophen 325 MG tablet  Commonly known as:  TYLENOL  Take 325 mg by mouth 3 (three) times daily.     calcium citrate 950 MG tablet  Commonly known as:  CALCITRATE - dosed in mg elemental calcium  Take 400 mg of elemental calcium by mouth daily. Take 2 Tablets By Mouth Daily.     cloNIDine 0.1 MG tablet  Commonly known as:  CATAPRES  Take 0.1 mg by mouth 2 (two) times daily.     dipyridamole-aspirin 200-25 MG per 12 hr capsule  Commonly known as:  AGGRENOX  Take 1 capsule by mouth 2 (two) times daily.     ENSURE  Take 237 mLs by mouth 3 (three) times daily.     ergocalciferol 50000 UNITS capsule  Commonly known as:  VITAMIN D2  Take 50,000 Units by mouth every 30 (thirty) days. On the 15th of every month     feeding supplement (PRO-STAT SUGAR FREE 64) Liqd  Take 30 mLs by mouth 3 (three) times daily with meals.     fluticasone 50 MCG/ACT nasal spray  Commonly known as:  FLONASE  Place 1 spray into both nostrils daily.     morphine CONCENTRATE 10 mg / 0.5 ml concentrated solution  Take 0.25 mLs (5 mg total) by mouth 3 (three) times daily.     morphine CONCENTRATE 10 mg / 0.5 ml concentrated solution  Take 0.25 mLs (5 mg total) by mouth every 3 (three) hours as needed for severe pain.     ondansetron 4 MG tablet  Commonly known as:  ZOFRAN  Take 4 mg by mouth every 6 (six) hours as needed for nausea or vomiting.     polyethylene glycol powder powder  Commonly known as:  GLYCOLAX/MIRALAX  Take 17 g by mouth daily as needed for mild constipation. FOR CONSTIPATION     senna-docusate 8.6-50 MG per tablet  Commonly known as:  Senokot-S  Take 1 tablet by mouth at bedtime.     sulfamethoxazole-trimethoprim 800-160 MG per tablet  Commonly  known as:  BACTRIM DS  Take 1 tablet by mouth 2 (two) times daily.     Travoprost (BAK Free) 0.004 % Soln ophthalmic solution  Commonly known as:  TRAVATAN  Place 1 drop into both eyes at bedtime.     UNABLE TO FIND  Med Name: Oxygen supplement 2 left/min per [place] as needed SaO2 < 92%     vitamin C 500 MG tablet  Commonly known as:  ASCORBIC ACID  Take 500 mg by mouth 2 (two) times daily.        Discharge Instructions: Please refer to Patient Instructions section of EMR  for full details.  Patient was counseled important signs and symptoms that should prompt return to medical care, changes in medications, dietary instructions, activity restrictions, and follow up appointments.   Follow-Up Appointments:   Elberta Leatherwood, MD 01/10/2014, 2:36 PM PGY-1, [place]

## 2014-01-09 NOTE — Plan of Care (Signed)
Problem: Phase I Progression Outcomes Goal: OOB as tolerated unless otherwise ordered Outcome: Adequate for Discharge Pt going back to SNF with Hospice.

## 2014-01-09 NOTE — Progress Notes (Signed)
FMTS Attending Note Patient seen and examined by me, discussed with resident team and I agree with intern progress note for today.  Arisa Congleton, MD 

## 2014-01-10 DIAGNOSIS — A419 Sepsis, unspecified organism: Secondary | ICD-10-CM

## 2014-01-10 MED ORDER — SULFAMETHOXAZOLE-TMP DS 800-160 MG PO TABS: 1.0000 | ORAL_TABLET | Freq: Two times a day (BID) | ORAL | Status: AC

## 2014-01-10 MED ORDER — MORPHINE SULFATE (CONCENTRATE) 10 MG /0.5 ML PO SOLN
5.0000 mg | ORAL | Status: DC | PRN
  Administered 2014-01-10: 5 mg via ORAL
  Filled 2014-01-10: qty 0.5

## 2014-01-10 MED ORDER — MORPHINE SULFATE (CONCENTRATE) 10 MG /0.5 ML PO SOLN: 5.0000 mg | Freq: Three times a day (TID) | ORAL | Status: DC

## 2014-01-10 MED ORDER — MORPHINE SULFATE (CONCENTRATE) 10 MG /0.5 ML PO SOLN: 5.0000 mg | ORAL | Status: DC | PRN

## 2014-01-10 MED ORDER — SENNOSIDES-DOCUSATE SODIUM 8.6-50 MG PO TABS: 1.0000 | ORAL_TABLET | Freq: Every day | ORAL | Status: AC

## 2014-01-10 NOTE — Plan of Care (Signed)
Problem: Phase III Progression Outcomes Goal: Foley discontinued Outcome: Not Progressing Pt has stage 4 wounds on sacrum.

## 2014-01-10 NOTE — Clinical Social Work Note (Signed)
Per MD patient ready to DC back to [facility]. RN, patient/family, and facility notified of patient's DC. RN given number for report. DC packet on patient's chart. Ambulance transport will be requested for patient by RN. CSW signing off at this time.   Liz Beach MSW, [place], [place], [phone]

## 2014-01-10 NOTE — Progress Notes (Signed)
[name] to be D/C'd SNF per MD order.  Discussed with the patient and all questions fully answered.    Medication List    STOP taking these medications       ciprofloxacin 500 MG tablet  Commonly known as:  CIPRO     clindamycin 300 MG capsule  Commonly known as:  CLEOCIN     traMADol 50 MG tablet  Commonly known as:  ULTRAM      TAKE these medications       acetaminophen 325 MG tablet  Commonly known as:  TYLENOL  Take 650 mg by mouth 2 (two) times daily as needed for mild pain.     acetaminophen 325 MG tablet  Commonly known as:  TYLENOL  Take 325 mg by mouth 3 (three) times daily.     calcium citrate 950 MG tablet  Commonly known as:  CALCITRATE - dosed in mg elemental calcium  Take 400 mg of elemental calcium by mouth daily. Take 2 Tablets By Mouth Daily.     cloNIDine 0.1 MG tablet  Commonly known as:  CATAPRES  Take 0.1 mg by mouth 2 (two) times daily.     dipyridamole-aspirin 200-25 MG per 12 hr capsule  Commonly known as:  AGGRENOX  Take 1 capsule by mouth 2 (two) times daily.     ENSURE  Take 237 mLs by mouth 3 (three) times daily.     ergocalciferol 50000 UNITS capsule  Commonly known as:  VITAMIN D2  Take 50,000 Units by mouth every 30 (thirty) days. On the 15th of every month     feeding supplement (PRO-STAT SUGAR FREE 64) Liqd  Take 30 mLs by mouth 3 (three) times daily with meals.     fluticasone 50 MCG/ACT nasal spray  Commonly known as:  FLONASE  Place 1 spray into both nostrils daily.     morphine CONCENTRATE 10 mg / 0.5 ml concentrated solution  Take 0.25 mLs (5 mg total) by mouth 3 (three) times daily.     morphine CONCENTRATE 10 mg / 0.5 ml concentrated solution  Take 0.25 mLs (5 mg total) by mouth every 3 (three) hours as needed for severe pain.     ondansetron 4 MG tablet  Commonly known as:  ZOFRAN  Take 4 mg by mouth every 6 (six) hours as needed for nausea or vomiting.     polyethylene glycol powder powder  Commonly known as:   GLYCOLAX/MIRALAX  Take 17 g by mouth daily as needed for mild constipation. FOR CONSTIPATION     senna-docusate 8.6-50 MG per tablet  Commonly known as:  Senokot-S  Take 1 tablet by mouth at bedtime.     sulfamethoxazole-trimethoprim 800-160 MG per tablet  Commonly known as:  BACTRIM DS  Take 1 tablet by mouth 2 (two) times daily.     Travoprost (BAK Free) 0.004 % Soln ophthalmic solution  Commonly known as:  TRAVATAN  Place 1 drop into both eyes at bedtime.     UNABLE TO FIND  Med Name: Oxygen supplement 2 left/min per [place] as needed SaO2 < 92%     vitamin C 500 MG tablet  Commonly known as:  ASCORBIC ACID  Take 500 mg by mouth 2 (two) times daily.          Patient escorted via stretcher, and D/C SNF via EMS.  Audria Nine F 01/10/2014 6:35 PM

## 2014-01-10 NOTE — Progress Notes (Signed)
Patient was seen  in bed. She was awake and responsive to questions asked of her by nodding her head. She indicated some discomfort. The hospital [facility] consulted with the physician to address this discomfort.  SW and [facility] consulted with hospital SW-Bryant, resident physician-Dr.Phelps and BP Director-Pat Donney Rankins regarding patient's current status to coordinate discharge plans. Patient's sister, [name] has declined sending patient back to [place] or the bed offer from [facility], instead requesting a bed a [facility].  [facility] SW-Bryant completed joint telephone call to patient's sister, [name] to discuss discharge plans with her. [name] was informed that patient currently did not meet the criteria for residential hospice care at [facility], however bed offers were still available at [facility] and [facility]. [name] stated that she preferred [name], although she still has concerns. She felt that [facility] was not an option for placement at this time. She preferred that [name]. [facility] SW advised her that she will schedule a care plan meeting with the facility at the beginning of the week to address her care concerns. [name] stated that it was important that patient have the alternating pump and pressure mattress pad on her  bed. Hospital SW will work to include this on patient's discharge summary.  [place] team will follow patient at the facility post discharge for continued hospice care.   The above information was discussed with Dr. Gerarda Fraction who will follow-up to assure pressure mattress pad and current pain regiment is included on current discharge summary.   Carla Lonon,LCSW ([phone])

## 2014-01-10 NOTE — Progress Notes (Signed)
Inpatient [facility]m [facility] 5W 10 [facility] [place] RN Visit  Related admission to [facility] Dx: Unspecified Cerebrovascular disease; pt DNR Code status GOLD DNR Form accompanied pt to the hospital  Pt seen in room today, no family present; awake tracks with eyes; staff RN Jenny Reichmann at bedside administering morning medications; pt tracking with eyes, able to follow some simple commands, did confirm she was uncomfortable by nodding yes and did receive scheduled Liquid Morphine by staff RN during visit; assisted staff RN with repositioning pt at which time Ms Murchison did have increased RR=20 and some noted heavy breathing, grimacing, with eyes opening wide, however once settled and pt became calm and when asked denied any discomfort by shaking head 'no' -per discussion with [facility] staff sacral dressing continues to be done BID by staff RN and pt is medicated for pain prior to dressing changes; pt being fed and per chart notes taking 50-90% the last 2 days  -pt continues on IV Flagyl and Rocephin; note continued discussions with pt's sister regarding treatment plan and disposition; spoke with CSW Marshell Levan who indicated he requested pt's sister/decision maker, Ms Chana Bode to call him back with decision on possible bed offer -he had not received call back at time of this discussion; writer attempted to call pt's sister Stanton Kidney [phone]) unable to leave message.  [facility] Team aware of above and continue to follow, available as needed. [facility] Medication list, transfer summary in shadow chart; please call [phone] with any questions/concerns.  Danton Sewer, RN MSN [facility] Liaison  [phone]

## 2014-01-10 NOTE — Progress Notes (Signed)
Ambulance called for transport from hospital to [facility]

## 2014-01-10 NOTE — Progress Notes (Signed)
Palliative Medicine Team  Disposition challenges noted. [facility] team monitoring her symptoms and leading the discharge planning - she is requiring scheduled and prn morphine for pain from her wound and osteomyelitis. I support a disposition plan that will get [name] into an environment where she will be well cared for with compassion and dignity and have close attention to her pain and receive gentle wound care-a place where [name] can feel more confident that [name] is getting what she needs. I do not believe IV antibiotics will provide a significant mortality benefit or contribute to wound healing or control her pain and therefore should not be offered and I have shared my recommendation with [name] and the Hospice Team. Will need to have ongoing goals of care discussions with [facility] team and clear expectations set for how the the wound will be handled. PMT will sign off. Please re-consult if we can be of further assistance.  Lane Hacker, DO Palliative Medicine [phone]

## 2014-01-10 NOTE — Progress Notes (Signed)
Report called and given to [place] at [facility].

## 2014-01-10 NOTE — Progress Notes (Signed)
Per [place] MD, pt can leave to SNF with foley in place. PICC Line to be removed per order by iv team.

## 2014-01-10 NOTE — Progress Notes (Signed)
Update: I spoke with Social worker this morning about Ms. Fries. He stated that I should call [name](Sister/POA) to discuss plans. When I called and spoke to [facility] we decided to stop IV antibiotics. I discussed how futile this is for her at this stage in her medical care. She did however insist that if her sister was able to still swallow can we give her PO Abx. This was agreed upon. I reiterated that Ms. Boruff is comfort care and hospice. She also wanted to know if she could be placed in a residential hospice. I let her know that I would try and see if that is a possibility. CSW told me that the only SNF facilities available for the patient at this time were [facility] and [facility]. [name] did not want either facility.   I later spoke to hospice palliative care of [facility]. They said patient did not meet criteria at this time for residential hospice placement. They said that they would however still follow-up with Ms. Gilkey at whatever facility she goes too since she is a hospice patient.  I spoke to [place] and informed him that he should call Stanton Kidney and let her know she only has two options at this point. Patient is medically ready for discharge and we can not keep her inpatient until a facility opens up that is suitable to the families needs.

## 2014-01-10 NOTE — Progress Notes (Addendum)
FMTS ATTENDING  NOTE Darnella Zeiter,MD I  have seen and examined this patient, reviewed their chart. I have discussed this patient with the [facility] residents. I   Patient name: [name] Medical record number: [id number]  Date of birth: [birth year] Age: 79 y.o. Gender: female   Primary Care Provider: Vance Gather, MD  Consultants: General Surgery  Code Status: DNR   Pt Overview and Major Events to Date:  10/21: admitted to hospital under family medicine care; met sepsis criteria.  10/22: General Surgery, Palliative Care, and Wound Care consulted  10/26: waiting for Family to distinguish level and goals of care. Will DC when this has been clearly stated by family.  10/27: patients family has been contacted. Most likely dispo is [facility]. Will likely DC later today or tomorrow.   Assessment and Plan:  [name] is a 79 y.o. female presenting with sepsis . PMH is significant for dementia, CVA, stage 4 decubitus ulcer, Sacral osteomyelitis, hypertension, vitamin D deficiency   # Sepsis 2/2 sacral decubitus ulcer (stage 4)/sacral osteomyelitis. Sister would like treatment with antibiotics  - Continue IV Ceftriaxone and Flagyl - PICC line in place  - Palliative recommended d/c antibiotic although her sister still wants this to be continued. - General Surgery Consult - appreciate the input  - Palliative care consult - appreciate recs - continue Roxanol sched and IV morphine for breakthrough  - Continue wound care follow up.    # Palliative Care - Hospice patient prior to admission. Goals established for comfort.  - Palliative care and Hospice following - appreciate recs  - Pain control as above  - Work towards placement at SNF other than [facility]  - Senna-S QHS and PRN dulcolax supp for constipation  - Minimize lab draws/sticks  - SLP eval - dysphagia 1  - As per Dr Elizabeth Sauer discussion with patient's sister, they will prefer she is discharge to residential hospice care other than in  [facility]. - I contacted hospice nurse and left a message, they will call me back.  # Hypernatremia - sodium 160 > 155 > 151  - D5-1/2NS @ MIVF  - will continue to follow  - Consider reducing fluid.  # Hyperkalemia - Resolved. K+ 6.0 10/23 s/p Kayexalate 15g   # Hypertension - continue clonidine   # CAD - continue aggrenox   # Vitamin D deficiency - continue calcium and vitamin D   FEN/GI: dysphagia 1, continue supplements (after assessment)  Prophylaxis: heparin subq, miralax, zofran for nausea   Disposition: pending SNF/ hospice care placement.   Subjective:  Patient is lying in bed, not communicative.   Objective:  Filed Vitals:   01/09/14 0655 01/09/14 1300 01/09/14 2015 01/10/14 0521  BP: 120/80 140/89 118/86 133/69  Pulse: 83 90 89 83  Temp: 97.9 F (36.6 C) 98.4 F (36.9 C) 98.2 F (36.8 C) 98.4 F (36.9 C)  TempSrc: Oral Oral Oral Oral  Resp: 18 18 18 18   Height:      Weight:      SpO2: 100% 100% 100% 98%    Physical Exam:  General: Laying in bed, nonverbal. NAD  Cardiovascular: RRR, 3/6 crescendo-decrescendo murmur heard best at LUSB and holosystolic blowing murmur best heard at apex  Respiratory: Clear to auscultation bilaterally in anterior lung fields  Abdomen: Soft, non-tender, non-distended  Extremities: B/L trace edema  Skin: Wound not assessed this morning, patient obese and was difficult to turn over Neuro: Awake and alert.  Lab: Reviewed  Results for [place],  [place] A  Ref. Range 01/08/2014 04:44  Sodium Latest Range: 137-147 mmol/L 147  Potassium Latest Range: 3.7-5.3 mEq/L 3.7  Chloride Latest Range: 96-112 mEq/L 113 (H)  CO2 Latest Range: 19-32 mEq/L 23  BUN Latest Range: 4-21 mg/dL 33 (H)  Creatinine Latest Range: 0.50-1.10 mg/dL 0.69  Calcium Latest Range: 8.4-10.5 mg/dL 8.5  GFR calc non Af Amer Latest Range: >90 mL/min 82 (L)  GFR calc Af Amer Latest Range: >90 mL/min >90  Glucose No range found 267 (H)  Anion gap Latest Range: 5-15   11

## 2014-01-10 NOTE — Discharge Instructions (Signed)
Pressure Ulcer A pressure ulcer is a sore that has formed from the breakdown of skin and exposure of deeper layers of tissue. It develops in areas of the body where there is unrelieved pressure. Pressure ulcers are usually found over a bony area, such as the shoulder blades, spine, lower back, hips, knees, ankles, and heels. Pressure ulcers vary in severity. Your health care provider may determine the severity (stage) of your pressure ulcer. The stages include:  Stage I--The skin is red, and when the skin is pressed, it stays red.  Stage II--The top layer of skin is gone, and there is a shallow, pink ulcer.  Stage III--The ulcer becomes deeper, and it is more difficult to see the whole wound. Also, there may be yellow or brown parts, as well as pink and red parts.  Stage IV--The ulcer may be deep and red, pink, brown, white, or yellow. Bone or muscle may be seen.  Unstageable pressure ulcer--The ulcer is covered almost completely with [place], brown, or yellow tissue. It is not known how deep the ulcer is or what stage it is until this covering comes off.  Suspected deep tissue injury--A person's skin can be injured from pressure or pulling on the skin when his or her position is changed. The skin appears purple or maroon. There may not be an opening in the skin, but there could be a blood-filled blister. This deep tissue injury is often difficult to see in people with darker skin tones. The site may open and become deeper in time. However, early interventions will help the area heal and may prevent the area from opening. CAUSES  Pressure ulcers are caused by pressure against the skin that limits the flow of blood to the skin and nearby tissues. There are many risk factors that can lead to pressure sores. RISK FACTORS  Decreased ability to move.  Decreased ability to feel pain or discomfort.  Excessive skin moisture from urine, stool, sweat, or secretions.  Poor  nutrition.  Dehydration.  Tobacco, drug, or alcohol abuse.  Having someone pull on bedsheets that are under you, such as when health care workers are changing your position in a hospital bed.  Obesity.  Increased adult age.  Hospitalization in a critical care unit for longer than 4 days with use of medical devices.  Prolonged use of medical devices.  Critical illness.  Anemia.  Traumatic brain injury.  Spinal cord injury.  Stroke.  Diabetes.  Poor blood glucose control.  Low blood pressure (hypotension).  Low oxygen levels.  Medicines that reduce blood flow.  Infection. DIAGNOSIS  Your health care provider will diagnose your pressure ulcer based on its appearance. The health care provider may determine the stage of your pressure ulcer as well. Tests may be done to check for infection, to assess your circulation, or to check for other diseases, such as diabetes. TREATMENT  Treatment of your pressure ulcer begins with determining what stage the ulcer is in. Your treatment team may include your health care provider, a wound care specialist, a nutritionist, a physical therapist, and a surgeon. Possible treatments may include:   Moving or repositioning every 1-2 hours.  Using beds or mattresses to shift your body weight and pressure points frequently.  Improving your diet.  Cleaning and bandaging (dressing) the open wound.  Giving antibiotic medicines.  Removing damaged tissue.  Surgery and sometimes skin grafts. HOME CARE INSTRUCTIONS  If you were hospitalized, follow the care plan that was started in the hospital.    Avoid staying in the same position for more than 2 hours. Use padding, devices, or mattresses to cushion your pressure points as directed by your health care provider.  Eat a well-balanced diet. Take nutritional supplements and vitamins as directed by your health care provider.  Keep all follow-up appointments.  Only take over-the-counter or  prescription medicines for pain, fever, or discomfort as directed by your health care provider. SEEK MEDICAL CARE IF:   Your pressure ulcer is not improving.  You do not know how to care for your pressure ulcer.  You notice other areas of redness on your skin.  You have a fever. SEEK IMMEDIATE MEDICAL CARE IF:   You have increasing redness, swelling, or pain in your pressure ulcer.  You notice pus coming from your pressure ulcer.  You notice a bad smell coming from the wound or dressing.  Your pressure ulcer opens up again. Document Released: 03/02/2005 Document Revised: 03/07/2013 Document Reviewed: 11/07/2012 ExitCare Patient Information 2015 [employer]. This information is not intended to replace advice given to you by your health care provider. Make sure you discuss any questions you have with your health care provider.  

## 2014-01-10 NOTE — Clinical Social Work Note (Signed)
CSW was able to reach [facility] regarding patient's DC this morning. Stanton Kidney reports that she is very confused about the plan and wants to speak with the MD. CSW explained that the patient does have a bed at [facility]. CSW explained that if the sister wants to look into one of the "considering" facilities such as [facility], then CSW needs to know this now so that this can be set up. Stanton Kidney states that she wants to speak with the MD first. CSW has contact MD to inform that Stanton Kidney is available by phone at this time. MD plans to contact [facility]. MD to contact CSW regarding decisions made and discharge plan. CSW will wait for call from MD.   Liz Beach MSW, [place], [place], [phone]

## 2014-01-10 NOTE — Plan of Care (Signed)
Problem: Phase II Progression Outcomes Goal: Obtain order to discontinue catheter if appropriate Outcome: Adequate for Discharge Per MD, pt to leave to SNF with Foley   Problem: Phase III Progression Outcomes Goal: Foley discontinued Outcome: Not Met (add Reason) Pt to leave with foley      

## 2014-01-11 ENCOUNTER — Non-Acute Institutional Stay: Admitting: Family Medicine

## 2014-01-11 DIAGNOSIS — L89154 Pressure ulcer of sacral region, stage 4: Secondary | ICD-10-CM

## 2014-01-12 ENCOUNTER — Other Ambulatory Visit: Payer: Self-pay | Admitting: Family Medicine

## 2014-01-12 DIAGNOSIS — Z8673 Personal history of transient ischemic attack (TIA), and cerebral infarction without residual deficits: Secondary | ICD-10-CM

## 2014-01-12 MED ORDER — MORPHINE SULFATE (CONCENTRATE) 10 MG /0.5 ML PO SOLN: 5.0000 mg | ORAL | Status: DC | PRN

## 2014-01-12 NOTE — Addendum Note (Signed)
Addended byWendy Poet, Naela Nodal D on: 01/12/2014 05:09 PM   Modules accepted: Level of Service

## 2014-01-12 NOTE — Progress Notes (Signed)
Patient ID: [name], female   DOB: [birth year], 79 y.o.   MRN: [id number]  Nursing Home Admission H&P  HPI: Patient was admitted to the hospital on 01/03/14 and discharged on 01/10/14. She was admitted with sepsis likely secondary to stage 4 sacral decubitus terminal ulcer. She was started on vancomycin and cefepime IV. ID, general surgery, and palliative care teams were consulted. She was discharged on bactrim PO, as well as scheduled and prn roxanol. Of note pt was hypernatremic at 160 on admission which was gradually corrected.  Patient is presently nonverbal and thus unable to attribute any additional history at this time.  Exam: Gen: NAD, lying in bed, eyes open to voice Heart: 3/6 systolic murmur loudest at LUSB, regular rhythm Lungs: NWOB, CTAB via anterior auscultation Abd: NABS, soft, nontender to palpation, obese Ext: 2+ pitting edema bilat shins, no erythema or tenderness Neuro: nonverbal other than "yes" or "no" Sacrum: defer exam to time of wound care assessment to avoid causing patient unnecessary discomfort  Assessment: 79 yo female readmitted to [facility] after hospital admission for sepsis secondary to stage 4 sacral decubitus ulcer. Presently stable but terminally ill, receiving hospice services.  Plan: 1. Stage 4 sacral decub ulcer - terminal wound. Continue SNF wound care services. Continue bactrim PO BID x 5 more days. Roxanol scheduled QID for comfort, also available prn for dressing changes. Continue hospice care, consider transfer to inpatient hospice if necessary for comfort.  2. All other chronic problems are stable and will be managed as per prior to hospital admission.  Family updated at bedside and agree with plan.  Chrisandra Netters, MD Family Medicine PGY-3 Geriatrics Resident

## 2014-01-12 NOTE — Progress Notes (Signed)
Patient ID: [name], female   DOB: May 17, 1934, 79 y.o.   MRN: [id number] I have seen and examined this patient. I have discussed with Dr Ardelia Mems.  I agree with their findings and plans as documented in their admission note.  [name] was present at bedside for admission exam.   [name], sister and HCPOA agent for patient, is comfortable with treatment with oral antibiotics for the sacral osteomyelitis.   Patient on an air loss mattress in NH facility. Hospice helping with pain and symptoms control. Patient appeared comfortable having just received dose of oral morphine 5 mg.

## 2014-01-15 ENCOUNTER — Non-Acute Institutional Stay: Admitting: Family Medicine

## 2014-01-15 DIAGNOSIS — J690 Pneumonitis due to inhalation of food and vomit: Secondary | ICD-10-CM

## 2014-01-16 ENCOUNTER — Non-Acute Institutional Stay (INDEPENDENT_AMBULATORY_CARE_PROVIDER_SITE_OTHER): Payer: Medicare Other | Admitting: Family Medicine

## 2014-01-16 ENCOUNTER — Telehealth: Payer: Self-pay | Admitting: Family Medicine

## 2014-01-16 DIAGNOSIS — R600 Localized edema: Secondary | ICD-10-CM | POA: Insufficient documentation

## 2014-01-16 NOTE — Progress Notes (Signed)
[name] is a 79 y.o. female seen in the nursing home due to lower extremity edema.    Pt is a resident at [facility], asked to be seen by hospice nursing today due to new onset lower extremity edema.  As well, she was slightly tachypneic on exam.  She was recently d/c from hospital due to sarcal decubitus ulcerations stage IV, inoperable, currently completing 5 day course of bactrim.  Pt feeling all right, somewhat SOB but no new CP or abdominal pain, fever, chills, or sweats.    Past Medical History  Diagnosis Date  . Aphasia   . Muscle weakness   . Malaise   . Fatigue   . Breast neoplasm   . Hypertension   . Glaucoma   . UTI (lower urinary tract infection)   . Vitamin D deficiency   . Hiatal hernia   . Constipation   . Diarrhea   . Asthma   . Obesity   . Hyperlipidemia   . Alzheimer's dementia   . Depression   . Allergic rhinitis   . Dementia   . DVT (deep venous thrombosis) 2014    "put her on Xeralto" (01/20/2013)  . Pneumonia 2014  . Shortness of breath     "lately; just laying there" (01/20/2013)  . Type II diabetes mellitus   . Anemia   . History of blood transfusion     "got a unit yesterday" (01/20/2013)  . Stroke 2006; 2007    residual:  "mute, dementia, generalized weakness" (01/20/2013)  . Recurrent UTI     "at least 3 since 2008 when she went into nursing home" (01/20/2013)  . Breast cancer     "right" (01/20/2013)  . Depression   . Hospice care patient     active with [facility] - call [phone] with encounters  . C. difficile colitis 02/23/2013  . Coagulase negative Staphylococcus bacteremia 02/12/2013  . Sepsis due to Klebsiella pneumoniae 03/30/2013  . Stage IV pressure ulcer of sacral region 02/15/2012    Stage 4 10/27/13.  Followed by Wound Care team at [facility].    . H/O: CVA (cerebrovascular accident) 10/27/2013    Brain MRI 01/14/2013: Remote lacunar infarcts of the cerebellum bilaterally, right greater than left. Remote old encephalomalacia of the  left parietal and occipital lobe as well as the anterior right frontal lobe.  Residual right hemiparesis  Persistent expressive aphasia  . 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 Sacral osteomyelitis 01/05/2014    History  Smoking status  . Never Smoker   Smokeless tobacco  . Never Used    No family history on file.  Current Outpatient Prescriptions on File Prior to Visit  Medication Sig Dispense Refill  . acetaminophen (TYLENOL) 325 MG tablet Take 650 mg by mouth 2 (two) times daily as needed for mild pain.    Marland Kitchen acetaminophen (TYLENOL) 325 MG tablet Take 325 mg by mouth 3 (three) times daily.    . Amino Acids-Protein Hydrolys (FEEDING SUPPLEMENT, PRO-STAT SUGAR FREE 64,) LIQD Take 30 mLs by mouth 3 (three) times daily with meals.    . calcium citrate (CALCITRATE - DOSED IN MG ELEMENTAL CALCIUM) 950 MG tablet Take 400 mg of elemental calcium by mouth daily. Take 2 Tablets By Mouth Daily.    . cloNIDine (CATAPRES) 0.1 MG tablet Take 0.1 mg by mouth 2 (two) times daily.    Marland Kitchen  clopidogrel (PLAVIX) 75 MG tablet Take 1 tablet (75 mg total) by mouth daily.    Marland Kitchen ENSURE (ENSURE) Take 237 mLs by mouth 3 (three) times daily.     . ergocalciferol (VITAMIN D2) 50000 UNITS capsule Take 50,000 Units by mouth every 30 (thirty) days. On the 15th of every month    . fluticasone (FLONASE) 50 MCG/ACT nasal spray Place 1 spray into both nostrils daily.    . Morphine Sulfate (MORPHINE CONCENTRATE) 10 mg / 0.5 ml concentrated solution Take 0.25 mLs (5 mg total) by mouth 3 (three) times daily. 2 Bottle 0  . Morphine Sulfate (MORPHINE CONCENTRATE) 10 mg / 0.5 ml concentrated solution Take 0.25 mLs (5 mg total) by mouth every 3 (three) hours as needed for moderate pain or severe pain. 120 mL 0  . ondansetron (ZOFRAN) 4 MG tablet Take 4 mg by mouth every 6 (six) hours as needed for nausea or vomiting.    .  polyethylene glycol powder (GLYCOLAX/MIRALAX) powder Take 17 g by mouth daily as needed for mild constipation. FOR CONSTIPATION    . SANTYL ointment     . senna-docusate (SENOKOT-S) 8.6-50 MG per tablet Take 1 tablet by mouth at bedtime. 30 tablet 0  . sulfamethoxazole-trimethoprim (BACTRIM DS) 800-160 MG per tablet Take 1 tablet by mouth 2 (two) times daily. 10 tablet 0  . Travoprost, BAK Free, (TRAVATAN) 0.004 % SOLN ophthalmic solution Place 1 drop into both eyes at bedtime.    Marland Kitchen UNABLE TO FIND Med Name: Oxygen supplement 2 left/min per [place] as needed SaO2 < 92%    . vitamin C (ASCORBIC ACID) 500 MG tablet Take 500 mg by mouth 2 (two) times daily.      No current facility-administered medications on file prior to visit.    ROS: Per HPI.  All other systems reviewed and are negative.   Physical Exam V: 98% O2 on RA, 99 F = T, R = 22, P = 84 bpm Physical Examination: General appearance - NAD Mouth - mucous membranes moist, pharynx normal without lesions Chest - Bibasilar crackles  Heart - normal rate and regular rhythm, systolic murmur 3/6 at 2nd right intercostal space Extremities - pedal edema 2 + B/L

## 2014-01-16 NOTE — Telephone Encounter (Signed)
Received page from [facility] staff around 2000 regarding, [name], currently under care of hospice. Reported that patient was evaluated by [facility]-Geriatrics team earlier today on 11/3 given concern of increased lower ext edema. Work-up was ordered including BMET, CBC, ProBNP, and CXR, also given Lasix 20mg  x 1 dose and plan to re-evaluate on Geriatrics rounds tomorrow on 11/4. At that time, patient with stable vitals per notes, afebrile, some increased tachypnea otherwise maintaining O2 sat. Nursing reported that work-up results are still pending, however recent concern for potential aspiration pneumonia given reported gagging with some PO and worsening cough, around 1800 reported to have desat to 87% on 3L (from 98% on 3L) with tachypnea (RR 30), and fever to 102F (resolved s/p Tylenol). Earlier orders received from primary attending through hospice to continue increase Morphine to 10mg  q 4 hr and q 2 hr PRN while weaning off supplemental O2 as tolerated (sats >87%). Nursing attempted to contact patient's daughter HCPOA regarding status change, patients wishes and plan of care.  Discussed case with Dr. Andria Frames, in brief summary, agrees with continuing hospice treatment for patient at [facility], as likely hospitalization may not be in line with patient's hospice treatment plan. However, given that this may be caused by possible aspiration pneumonia, as reversible etiology, recommended can go ahead and treat with PO antibiotics empirically.  Called [facility] at 2300 to discuss updates, nursing staff reports that patient seems to be clinically improving, last vitals at 2145 with Temp 99.37F, HR 98, RR 28, BP 147/81, SpO2 96% on 3L , discussed treatment plan with sister HCPOA who agrees with continuing treatment under hospice at [facility] at this time. Verbal order given for Clindamycin PO 450mg  q 6 hr (QID) x 10 days. Plan for Geriatrics team to follow-up with rest of work-up from 11/3, continue to  discuss treatment plan with [facility] and re-evaluation as scheduled on 11/4.  Note forwarded to Geriatric resident and Dr. Wendy Poet.  Nobie Putnam, [place], PGY-2

## 2014-01-16 NOTE — Assessment & Plan Note (Signed)
Remote hx of CHF.  No TTP or concern for possible DVT at this time.   - Obtain BNP, BMET, CBC, and CXR today  - Renal fxn from hospital 0.69 upon d/c about 3 days ago now.   - Lasix 20 mg x 1 dose, could consider LE compression hosiery  - Reevaluate in AM during nursing home rounds  - Discussed case with HCPOA, her sister [name], with no further questions.

## 2014-01-17 ENCOUNTER — Encounter: Payer: Self-pay | Admitting: Family Medicine

## 2014-01-17 ENCOUNTER — Telehealth: Payer: Self-pay | Admitting: Family Medicine

## 2014-01-17 NOTE — Telephone Encounter (Signed)
[name] patients sister called and would like Dr. Awanda Mink or Dr. McDiarmid to call her concerning her sister who is not doing well and really needs to talk to someone. jw

## 2014-01-17 NOTE — Telephone Encounter (Signed)
Called sister back about [name].  She is concerned about not given her enough lasix.  I discussed we need her renal function to come back and her pressure to be a little higher before we just throw medication at her.  Will continue to monitor.  Answered [name]'s questions, no further questions or concerns at this time.   Thanks [employer]. Awanda Mink, DO of [facility] 01/17/2014, 4:04 PM

## 2014-01-17 NOTE — Progress Notes (Signed)
Subjective:     Patient ID: [name], female   DOB: Jul 26, 1934, 79 y.o.   MRN: [id number]   Information for visit obtained from [place], Floor nurse, and Hospice Nurse Amy Lovena Le.  I spoke with patient's HCPOA agent, [name] (sister) after assessing patient.    HPI  Febrile Illness Onset of fever 12/16/13 Pt had been seen before fever that day for increasing leg edema.  Pt with new  Tachypnea with desaturation on pulse ox.  There had been a potential aspiration event with emesis and then coughing the afternoon of 10/3 before onset of fever. Pt had Temp to 102F.   Patient was started on Clindamycin last night for possible aspiration along with titration up of her supplemental oxygen to 3 left/min with SaO2 > 92%.      Review of Systems     Objective:   Physical Exam Patient not speaking.  Eyes open. Moving limbs spontaneously. Occasionaly appears to focus on face of examiner.  PERRL. No behavioral displays of pain.  Moist oral mucosa.  Distinct crackles right posterior lung fields, scant crackles left lower posterior lung fields.  Foley tubing with dark amber fluid color. No tenderness over lower abdomen. (+)1-2 bilateral leg edema to distal thighs.  Warm extremities.  CHEST XRAY (Report): No mention of pulmonary edema. (+) right hilar mass/fullness (new).     Assessment and Plan     Febrile Illness - Working diagnosis is Aspiration Pneumonia given tachypnea and new oxygen need, right-sided pulmonary findings on exam in setting of patient with reported aspiration-like event yesterday.  - Patient at high risk of this febrile illness being her terminal illness.  I discussed this with Nilda Calamity, Hospice RN for patient, who a agreed with this survival prognostic.  - In my phone conversation with patient's HCPOA agent, [name], she said her goal was for her sister "to be taken care of."  Ms Chana Bode was advised of the distinct possibility that Ms Ratcliffe may expire from this new  illness event.  Ms Chana Bode was comfortable with oral antibiotic therapy for aspiration pneumonia and medications for comfort.  - Avelox was added to cover GNR, along with the Clindamycin for GPC (including MRSA) coverage (recent hospitatlization). - Hospice is in contact with the HCPOA discussing how to make patient comfortable.  Patient has Morphine oral for pain and dyspnea.  - If patient displays a deteriorating course, will need to reduce pill burden to essential medications for comfort and infection.  - Continue Care in [place].  - (+) DNR  - HCPOA agent, [name] (sister): [phone] (M)

## 2014-01-18 ENCOUNTER — Other Ambulatory Visit: Payer: Self-pay | Admitting: Family Medicine

## 2014-01-18 MED ORDER — MORPHINE SULFATE (CONCENTRATE) 10 MG /0.5 ML PO SOLN: 5.0000 mg | ORAL | Status: AC | PRN

## 2014-01-19 ENCOUNTER — Encounter: Payer: Self-pay | Admitting: Family Medicine

## 2014-01-19 NOTE — Progress Notes (Signed)
Patient ID: [name], female   DOB: 08/15/1934, 79 y.o.   MRN: [id number]  S: Pt unresponsive to voice and deep sternal rub.        Pt no longer taking per oral fluids, nutrition, medications O: VS stable per nursing      Pupils 1-2 mm bilaterally      Rattling inspirations, RR 12 / min.  No wheezing.       Abdomin soft, ND, NT to palp.      Ext: warm to touch  A/ Febrile illness: Possible Aspiration Pneumonia     Hyperkalemia - increasing even after attempt to lower with furosemide.     Rising Creatine secondary to dehydration     Terminal respiratory rattle      Patient appears comfortable.  Nursing is able to use the liquid morphine effectively for comfort.  Anticipate approaching death in hours to days.       I contacted HCPOA agent, [name] (sister of patient) to give her an update on      patient's condition. She seemed comfortable with the current plan.       P/ Continue comfort care measures, including as needed use of Morphine oral liquid. .      Would not check any further labs      If noisy chest rattle is disturbing to patient or family, may start scopolamine patch 1.5 mg every 72 hours or atropine ophthalmic drops, one drop oral every two hours as needed

## 2014-01-23 ENCOUNTER — Encounter: Payer: Self-pay | Admitting: Family Medicine

## 2014-01-23 ENCOUNTER — Non-Acute Institutional Stay: Admitting: Family Medicine

## 2014-01-23 ENCOUNTER — Telehealth: Payer: Self-pay | Admitting: Family Medicine

## 2014-01-23 DIAGNOSIS — Z515 Encounter for palliative care: Secondary | ICD-10-CM

## 2014-01-23 DIAGNOSIS — R569 Unspecified convulsions: Secondary | ICD-10-CM

## 2014-01-23 LAB — CBG MONITORING, ED: Glucose, fingerstick: 69

## 2014-01-23 NOTE — Progress Notes (Signed)
   Subjective:    Patient ID: [name], female    DOB: 03-02-1935, 79 y.o.   MRN: [id number]  HPI   Called to patient's bedside because of witness of onset of focal facial and left sided arm and leg convulsions at about Noon today by treatment nurse, Mechele Claude.  Event was continuing at my arrival at ~1210 hrs.   Patient without history of seizures. 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 Manifesting as right hemiparesis and expressive aphasia.   Nursing reporting copious white frothy sputum suctioned from oral cavity.  CBG 69 mg/dl.  Patient receiving no medications except SL oral morphine 10 mg as needed for comfort for which patient has been receiving about 6 doses a day for last several days.   Patient has not eaten or taken liquids in at least 5 days.   Medication list reviewed for what patient has actuallly been receiving. .     Review of Systems unable to obtain     Objective:   Physical Exam  VS noted Gen: both eyes open, conjugate fixed gaze to left bilaterally, non-responsive to voice or sternal rub.  Lungs: both inspiratory and expiratory rhonci.  No wheezing.  No acc mm use.  Cor: distant, regular Abdomin: soft, ND, (+) BS Facial twitching about eyes and mouth.   Right arm and right leg with rhytmic extensor muscle twitches but not enough to move limbs against gravity. No increase tone in limbs. No movement of left arm and left leg. Ext: trace edema pretibial bilateral. Warm extremities.      Assessment & Plan:   Acute Seizure, focal - Likely left hemisphere origin where patient has brain parenchymal encephalomalacia - Presumed metabolic from declining renal function and rising concentration of electrolytes. - Possible contribution from hypoglycemia - Possible contribution from build up of neurotoxic metabolites from Morphine.  Seizures controlled within 30 minutes after Ativan 2 mg IM  x2 ten minutes apart.  After consultation with Nilda Calamity, Hospice nurse practioner, plan to continue Ativan 1 mg SL every 4 hours (scheduled) with 1 mg Ativan SL every 15 minutes with active convulsive seizure activity until convulsions controlled.

## 2014-01-23 NOTE — Telephone Encounter (Signed)
Error

## 2014-01-31 ENCOUNTER — Non-Acute Institutional Stay: Admitting: Family Medicine

## 2014-01-31 DIAGNOSIS — Z515 Encounter for palliative care: Secondary | ICD-10-CM | POA: Insufficient documentation

## 2014-01-31 NOTE — Assessment & Plan Note (Signed)
Patient nearing end-of-life. Will continue scheduled Ativan to prevent seizures. Will continue O2 for comfort as well as Oxy IR for air hunger.  Regarding fever, patient was afebrile on exam.  Nursing staff encouraged to continue use of Tylenol suppositories.  Adding aspirin per rectum (600 mg Q6H PRN as ASA 650 mg was unavailable).

## 2014-01-31 NOTE — Progress Notes (Signed)
   Subjective:    Patient ID: [name], female    DOB: [birth year], 79 y.o.   MRN: [id number]  HPI 79 year old female with a complicated past medical history including vascular dementia was seen for follow-up today.  Patient is currently on hospice care and has had no PO intake for nearly 2 weeks.  She is nearing the end of life.  Patient was seen by palliative care yesterday and was started on Oxy IR for pain/air hunger.  Nursing staff reports that she is now febrile.  He states that this is been difficult to control with PRN Tylenol.  She's had no further seizure activity.   No other recent events per nursing staff.  Review of Systems Per HPI; cannot obtain ROS from patient due to advanced dementia and unresponsiveness.    Objective:   Physical Exam Vitals - Temp (oral) - 98.6; RR 40; P 79; SBP 138; Pulse ox 86 (5 L [place]).  Exam: General: resting in bed, warm to touch, increased work of breathing noted w/ accessory muscle use. Cardiovascular: RRR. 2/6 Systolic murmur noted.  Respiratory: Good air movement with transmitted upper airway sounds. Neuro: Does not respond to verbal stimuli. Patient did move her mouth when temperature was taken.      Assessment & Plan:  See Problem List

## 2014-02-01 ENCOUNTER — Telehealth: Payer: Self-pay | Admitting: Family Medicine

## 2014-02-01 NOTE — Telephone Encounter (Signed)
Emergency Line / After Hours Call  Received call from [place] staff that patient is having petit mal seizures, began about 10 minutes ago. Has ativan 1mg  q6h scheduled ordered, last received at 6pm. I gave verbal order for ativan 0.5mg  q15 minutes prn until seizures stop. Asked that nursing staff call me back if she is still seizing in one hour.  Chrisandra Netters, MD Family Medicine PGY-3

## 2014-02-01 NOTE — Telephone Encounter (Signed)
Update: received another call from [facility] staff that pt is still seizing. Has received four 0.5mg  ativan doses. Seizures have calmed some and are now focal, but still occuring. Gave verbal order to repeat another 0.5mg  ativan PO q49min until seizures stop, asked them to call me again in 1 hour if she continues to seize.  Chrisandra Netters, MD Family Medicine PGY-3

## 2014-02-13 NOTE — Telephone Encounter (Signed)
Update: received another call about 2.5 hours ago saying that pt had fever to 105 and pupils were fixed. She was given rectal tylenol.  Then I just received a call at 5:53am to report that [name] died at 5:45am. Her family was apparently not with her at the time but had been contacted and encouraged to come.  Will route to geri team and PCP to make them aware.  Chrisandra Netters, MD Family Medicine PGY-3

## 2014-02-13 NOTE — Addendum Note (Signed)
Addended byWendy Poet, Nezar Buckles D on: 18-Feb-2014 10:33 AM   Modules accepted: Level of Service

## 2014-02-13 NOTE — Progress Notes (Signed)
Patient ID: [name], female   DOB: 1934-11-23, 79 y.o.   MRN: [id number] I have seen and examined this patient with Dr Jene Every. I have discussed with Dr Lacinda Axon.  I agree with their findings and plans as documented in their acute visit note.  Patient is in active process of dying.  Working on comfort care with opiates and benzodiazipines. Control of fever with suppositories of APAP and Aspirin.

## 2014-02-13 DEATH — deceased

## 2014-02-22 ENCOUNTER — Encounter: Payer: Self-pay | Admitting: Internal Medicine

## 2014-03-27 IMAGING — CR DG CHEST 1V PORT
1 series · 1 of 1 positions shown · non-contrast
Comparison: 01/23/2013

CLINICAL DATA: Hypotension, weakness, new right central line

EXAM:
PORTABLE CHEST - 1 VIEW

[AP]
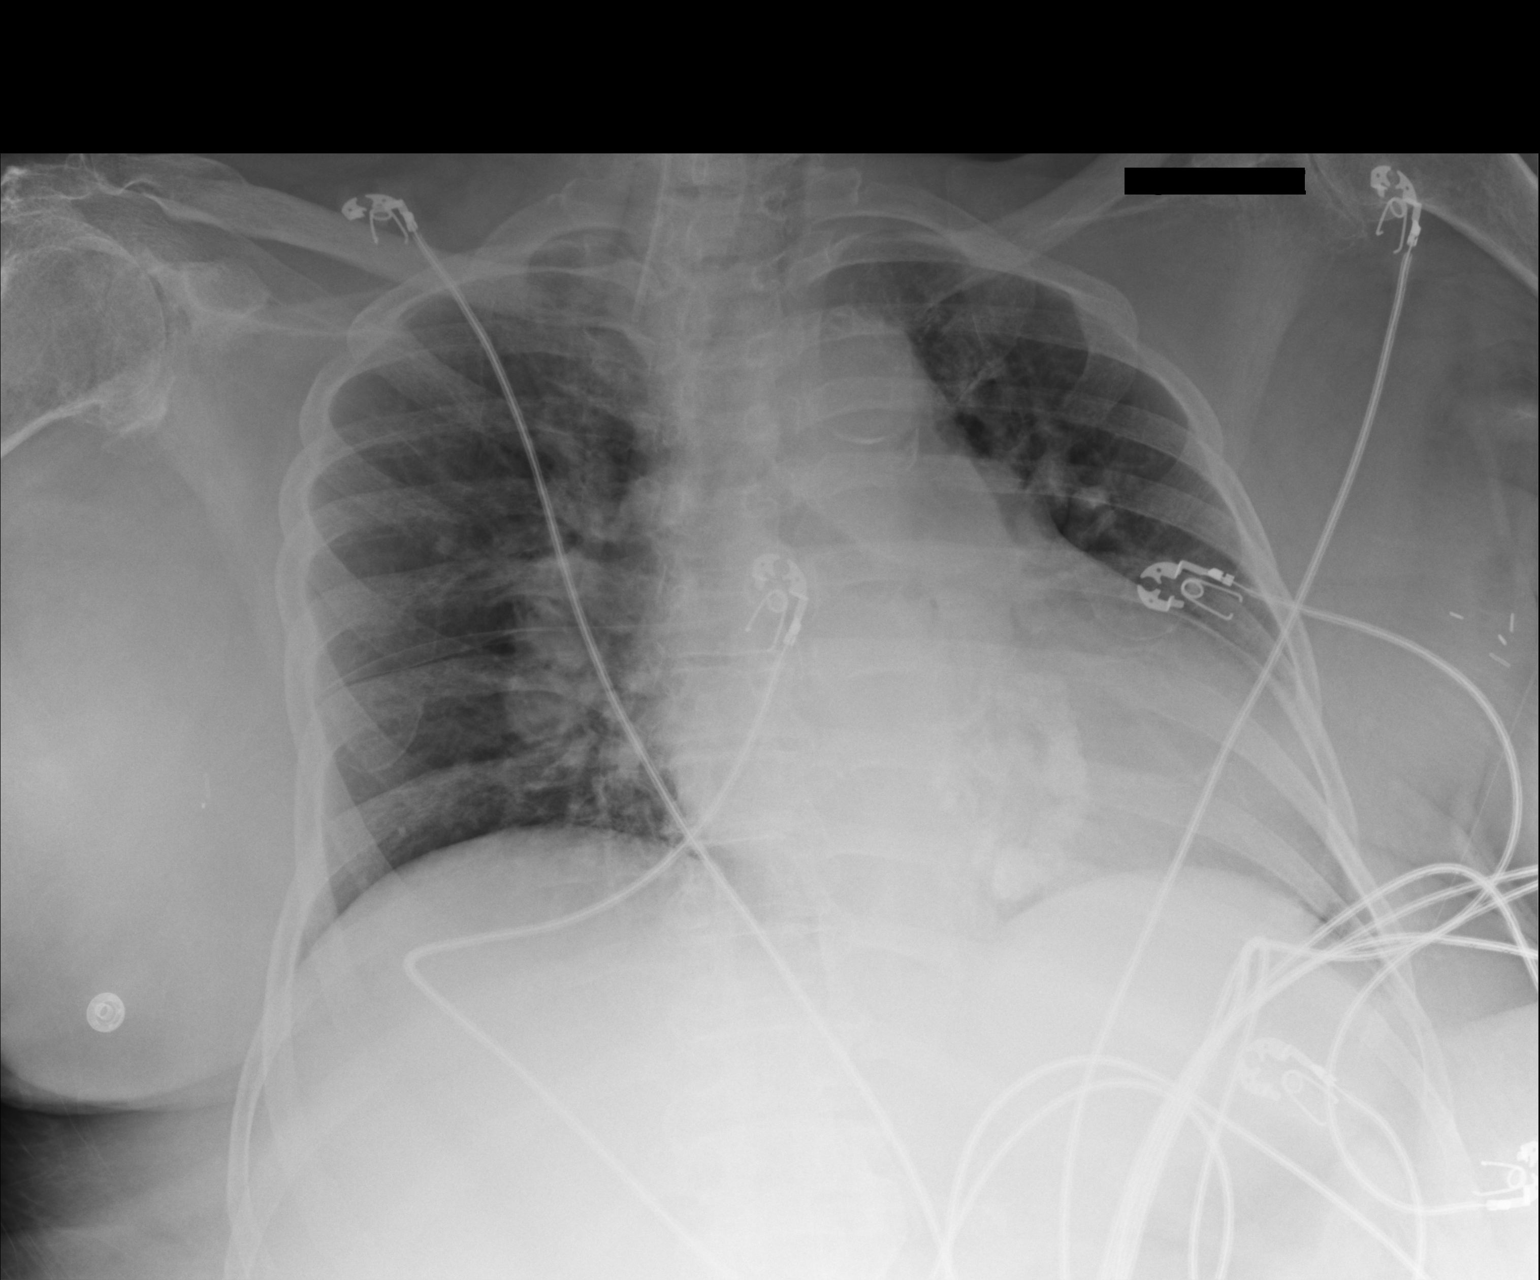

[1 of 1 positions shown; findings below may reference images not displayed]

FINDINGS: Lungs are essentially clear. No focal consolidation. No pleural
effusion or pneumothorax.

Mild cardiomegaly.  Coarse calcifications overlying the heart.

Right IJ venous catheter terminates at the confluence of the right
IJ and right innominate vein.

Degenerative changes of the right shoulder.
IMPRESSION: Right IJ venous catheter terminates at the confluence of the right
IJ and innominate veins.

No pneumothorax is seen.

## 2014-03-28 IMAGING — CR DG CHEST 1V PORT
1 series · 1 of 1 positions shown · non-contrast
Comparison: 02/12/2013

CLINICAL DATA: Respiratory failure.

EXAM:
PORTABLE CHEST - 1 VIEW

[AP]
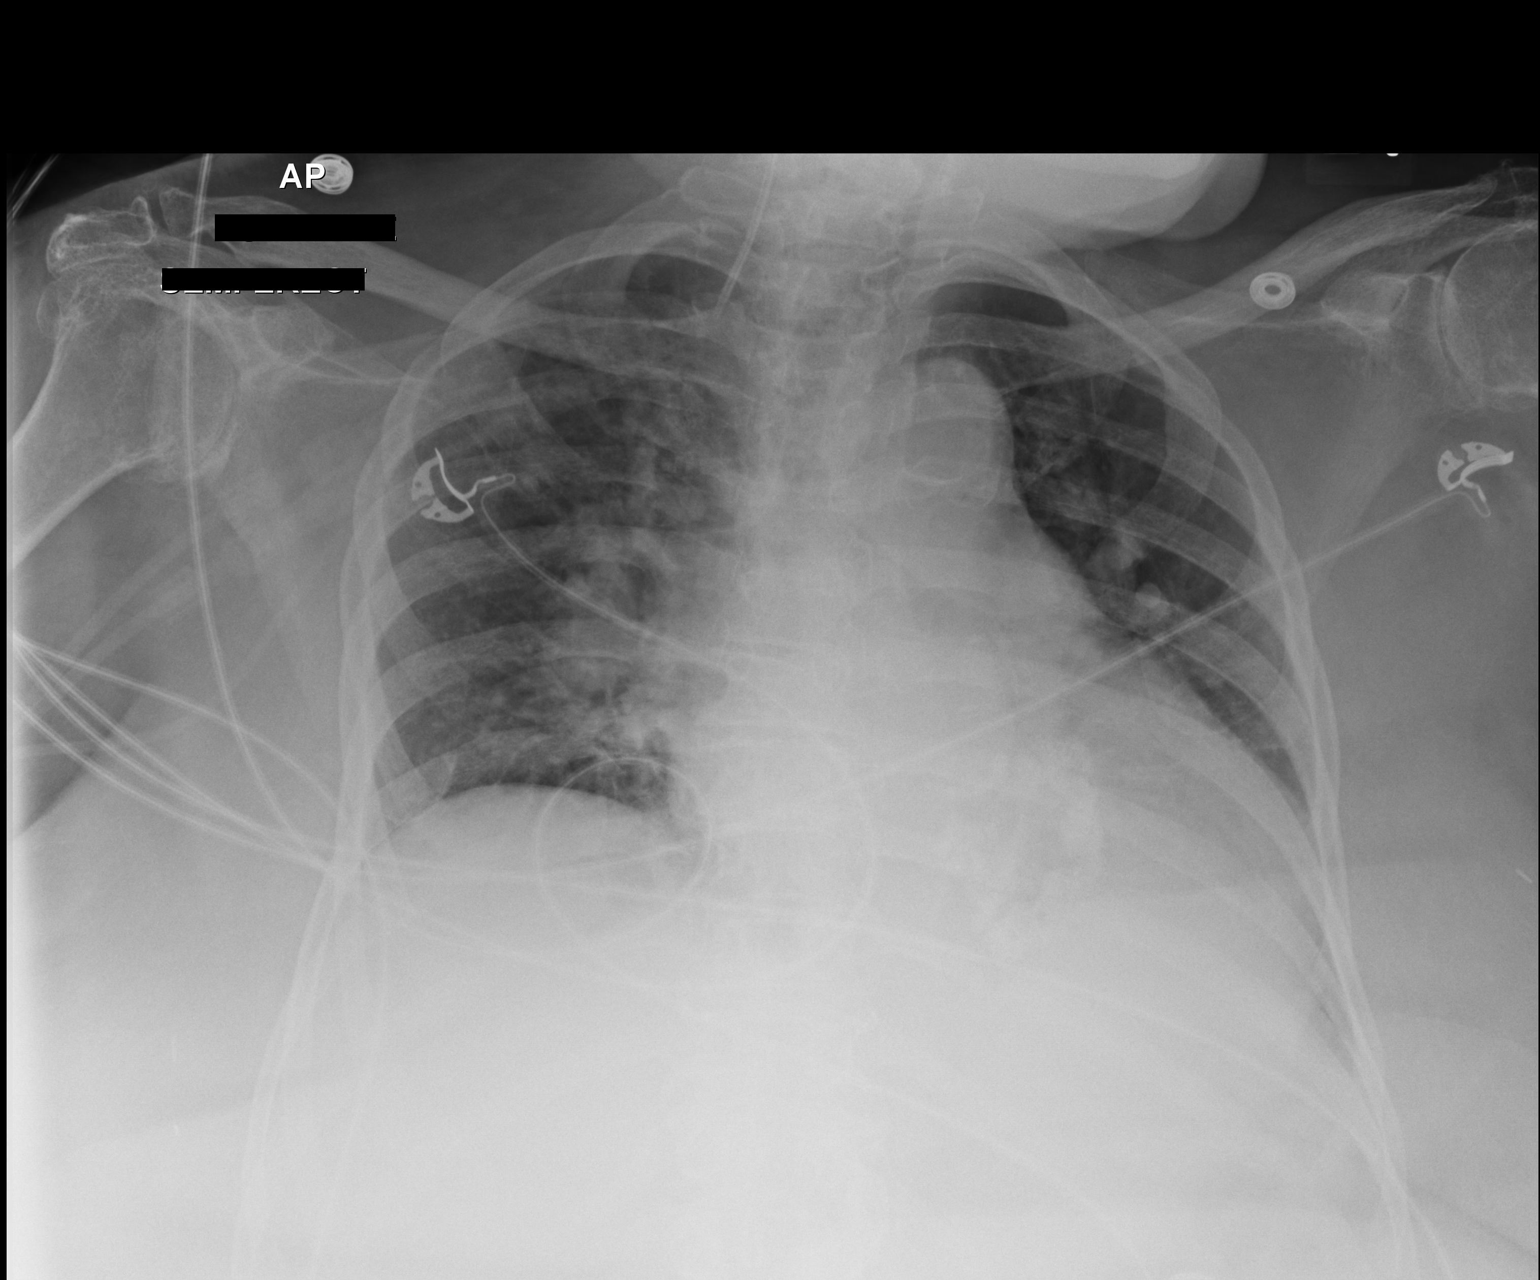

[1 of 1 positions shown; findings below may reference images not displayed]

FINDINGS: There are slightly decreased lung volumes. Jugular central line tip
is probably in the lower jugular vein region. Prominent central
vascular markings may represent vascular congestion or low lung
volumes. Patient is rotated towards the left. Again noted are
prominent mitral annular calcifications.
IMPRESSION: Low lung volumes. Prominent central vascular structures probably
represent vascular crowding but cannot exclude mild vascular
congestion.

Stable position of the right jugular central line.

## 2014-03-28 IMAGING — US US BREAST*R*
1 series · 13 of 17 positions shown · non-contrast
Comparison: 04/18/2012 and 01/29/2012

CLINICAL DATA: Palpable mass right breast. Patient is septic, rule
out abscess. Patient has a history of prior bilateral malignant
lumpectomies an known large complex seroma at the right lumpectomy
site.

EXAM:
ULTRASOUND RIGHT BREAST

[Series 1: us breast*right* · 0.09mm/px · 17 acquisitions, 13 frames shown]
[im 1/17]
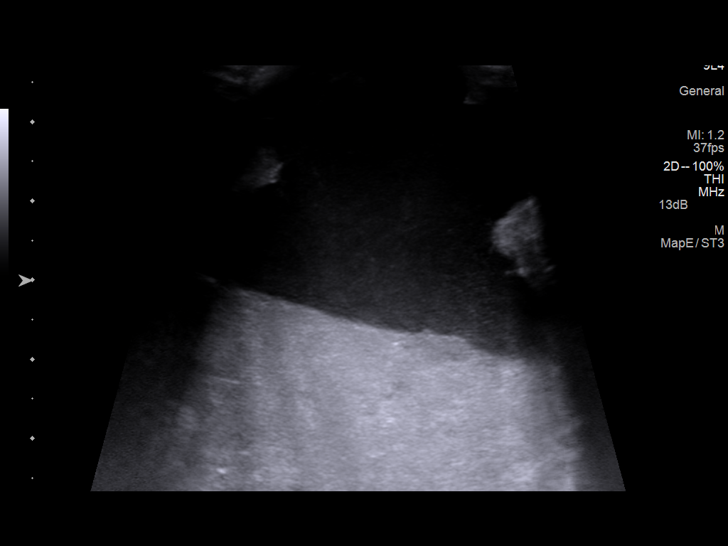
[im 2/17]
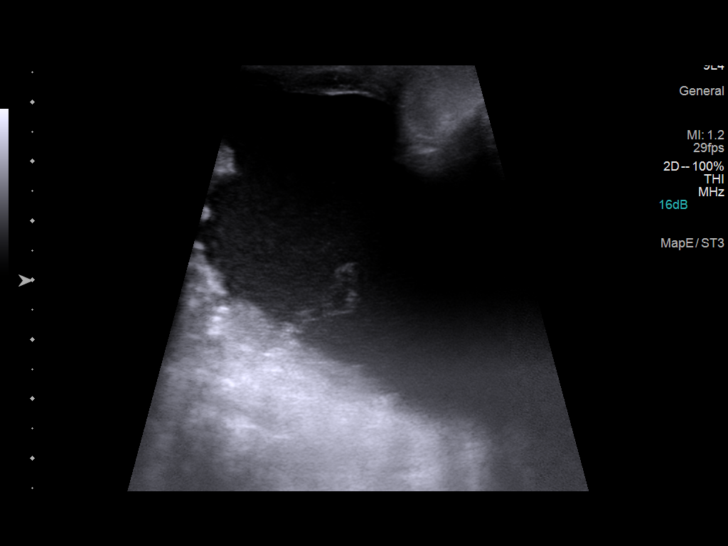
[im 4/17]
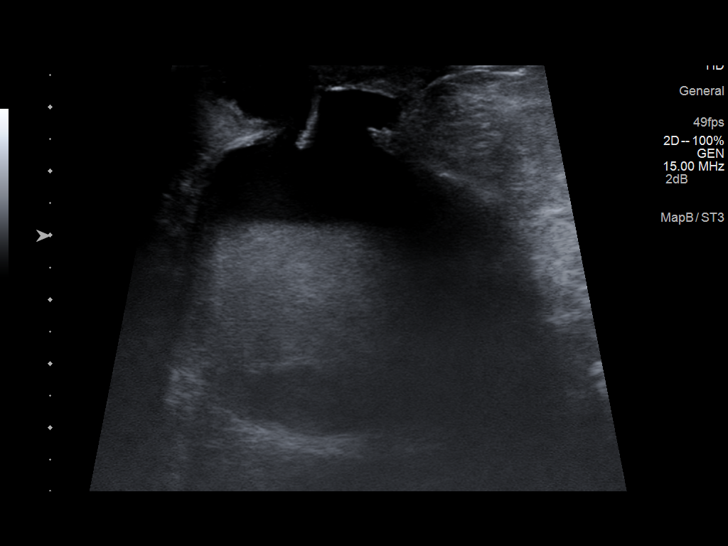
[im 5/17]
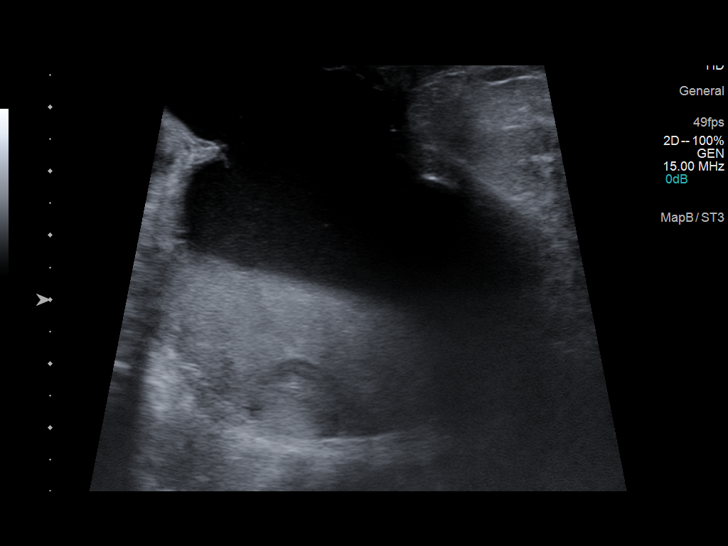
[im 6/17]
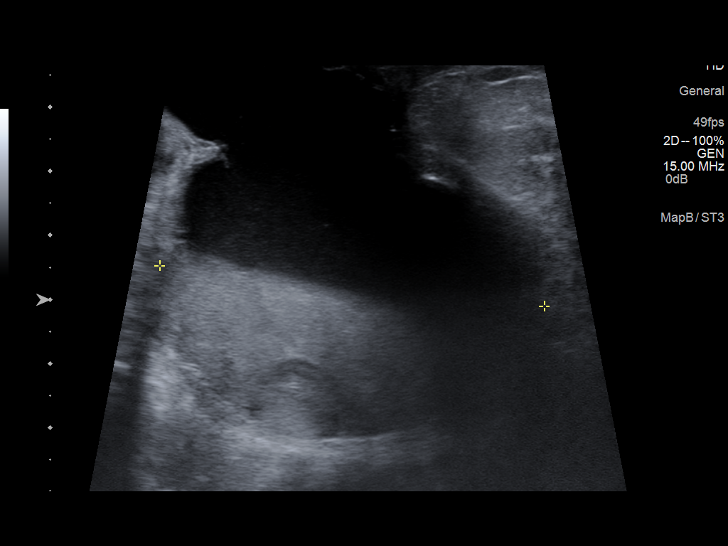
[im 8/17]
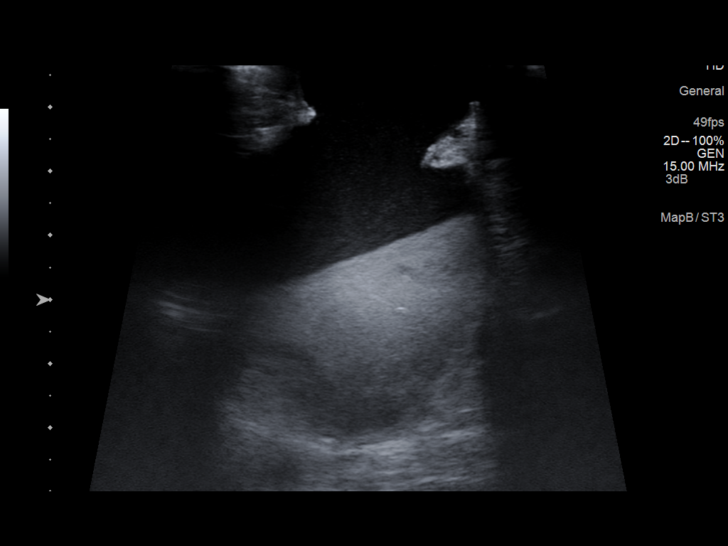
[im 9/17]
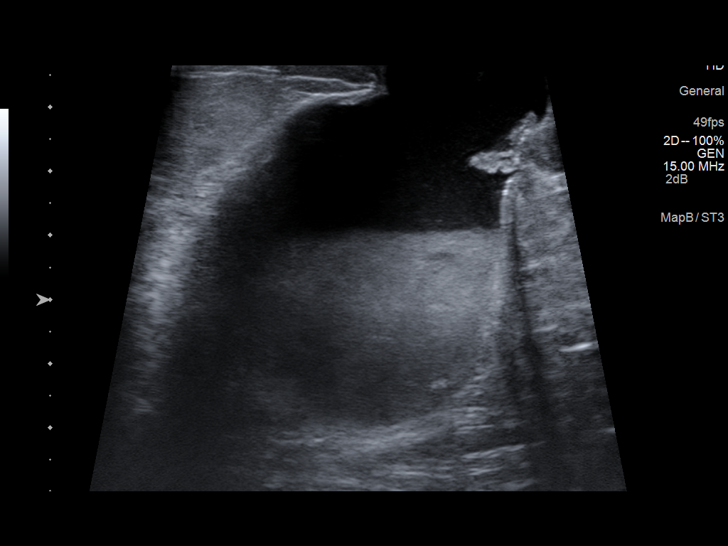
[im 10/17]
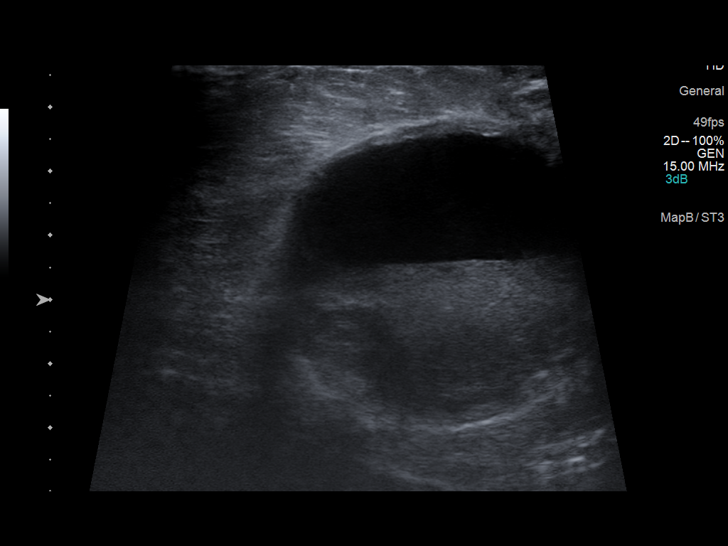
[im 12/17]
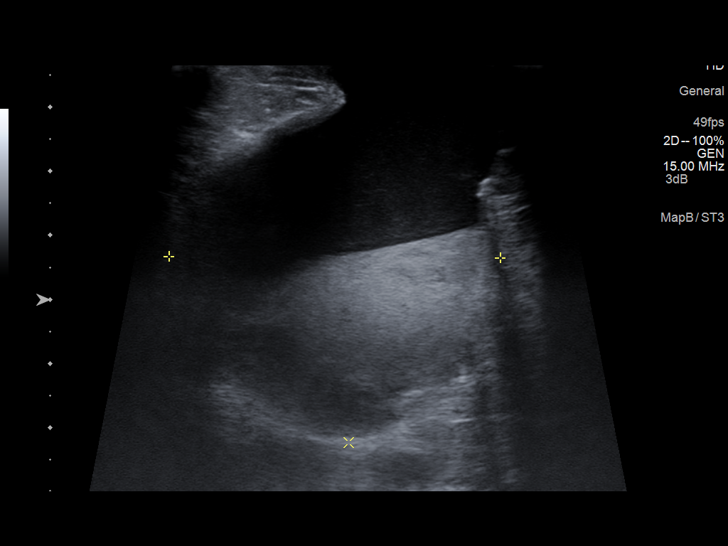
[im 13/17]
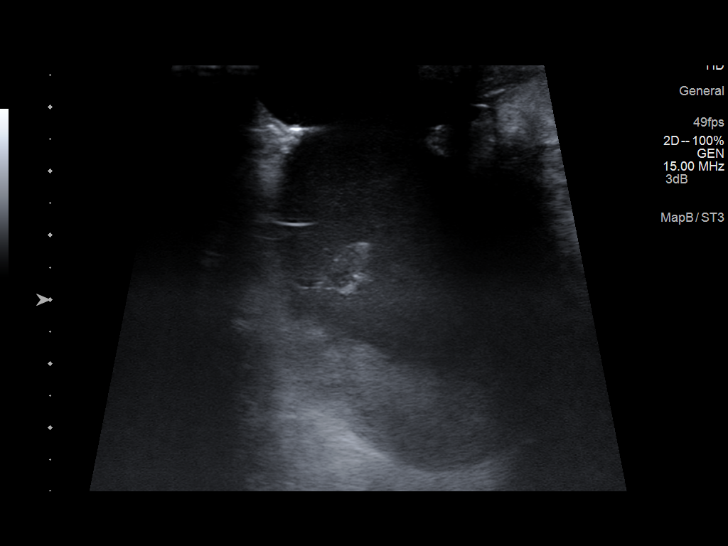
[im 14/17]
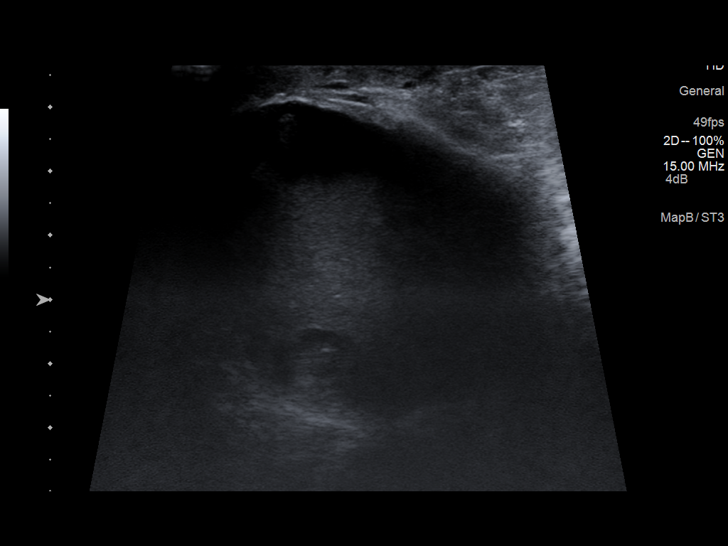
[im 16/17]
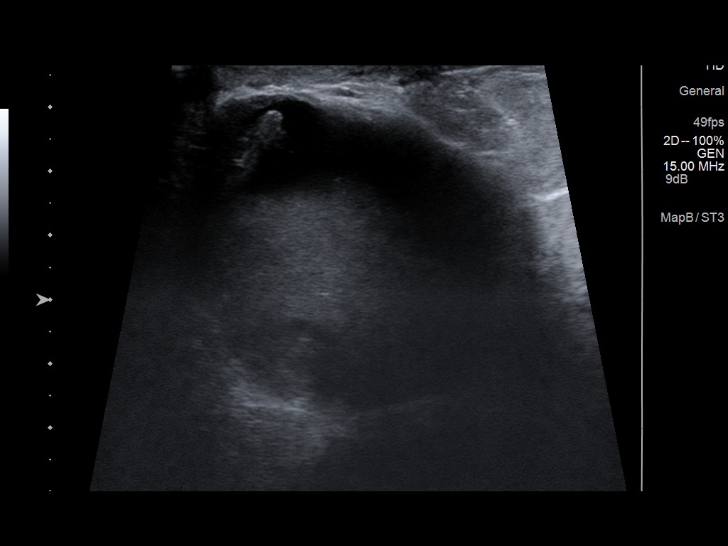
[im 17/17]
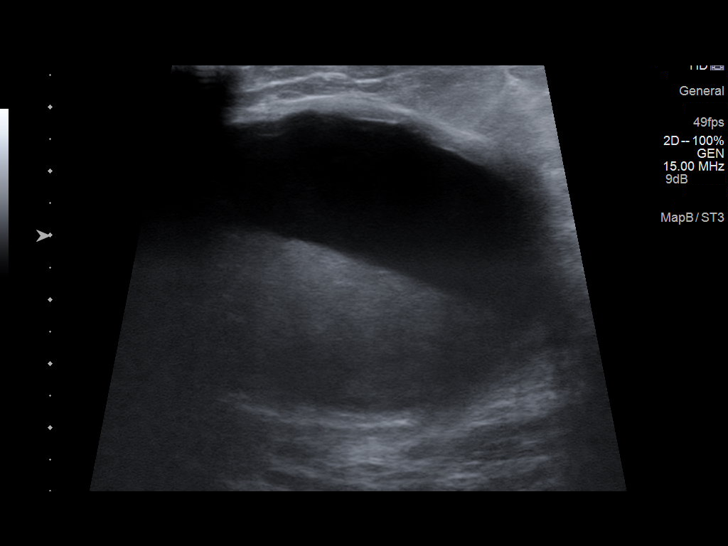

[13 of 17 positions shown; findings below may reference images not displayed]

FINDINGS: Ultrasound is performed over patient's palpable abnormality at the
12 o'clock position right periareolar region, showing evidence of
her known large complex fluid collection measuring approximately
x 6.0 x 6.3 cm and extending to the skin at the lumpectomy site.
This is compatible with patient's known seroma and is not
significantly changed from the prior exams.
IMPRESSION: 5.2 x 6.0 x 6.3 cm complex fluid collection over the 12 o'clock
position of the right periareolar region without significant change
from patient's prior exams compatible with a complex postoperative
seroma at the lumpectomy site. Cannot completely exclude
superimposed infection of this known fluid collection.

RECOMMENDATION:
Recommend followup as clinically indicated. Patient is due for
bilateral screening annual mammographic followup category 2132.

I have discussed the findings and recommendations with the patient.
Results were also provided in writing at the conclusion of the
visit. If applicable, a reminder letter will be sent to the patient
regarding the next appointment.

BI-RADS CATEGORY  Insert category 2

## 2014-03-28 IMAGING — CR DG ABD PORTABLE 1V
1 series · 1 of 1 positions shown · non-contrast
Comparison: None.

CLINICAL DATA: Nasogastric tube placement assessment.

EXAM:
PORTABLE ABDOMEN - 1 VIEW

[AP]
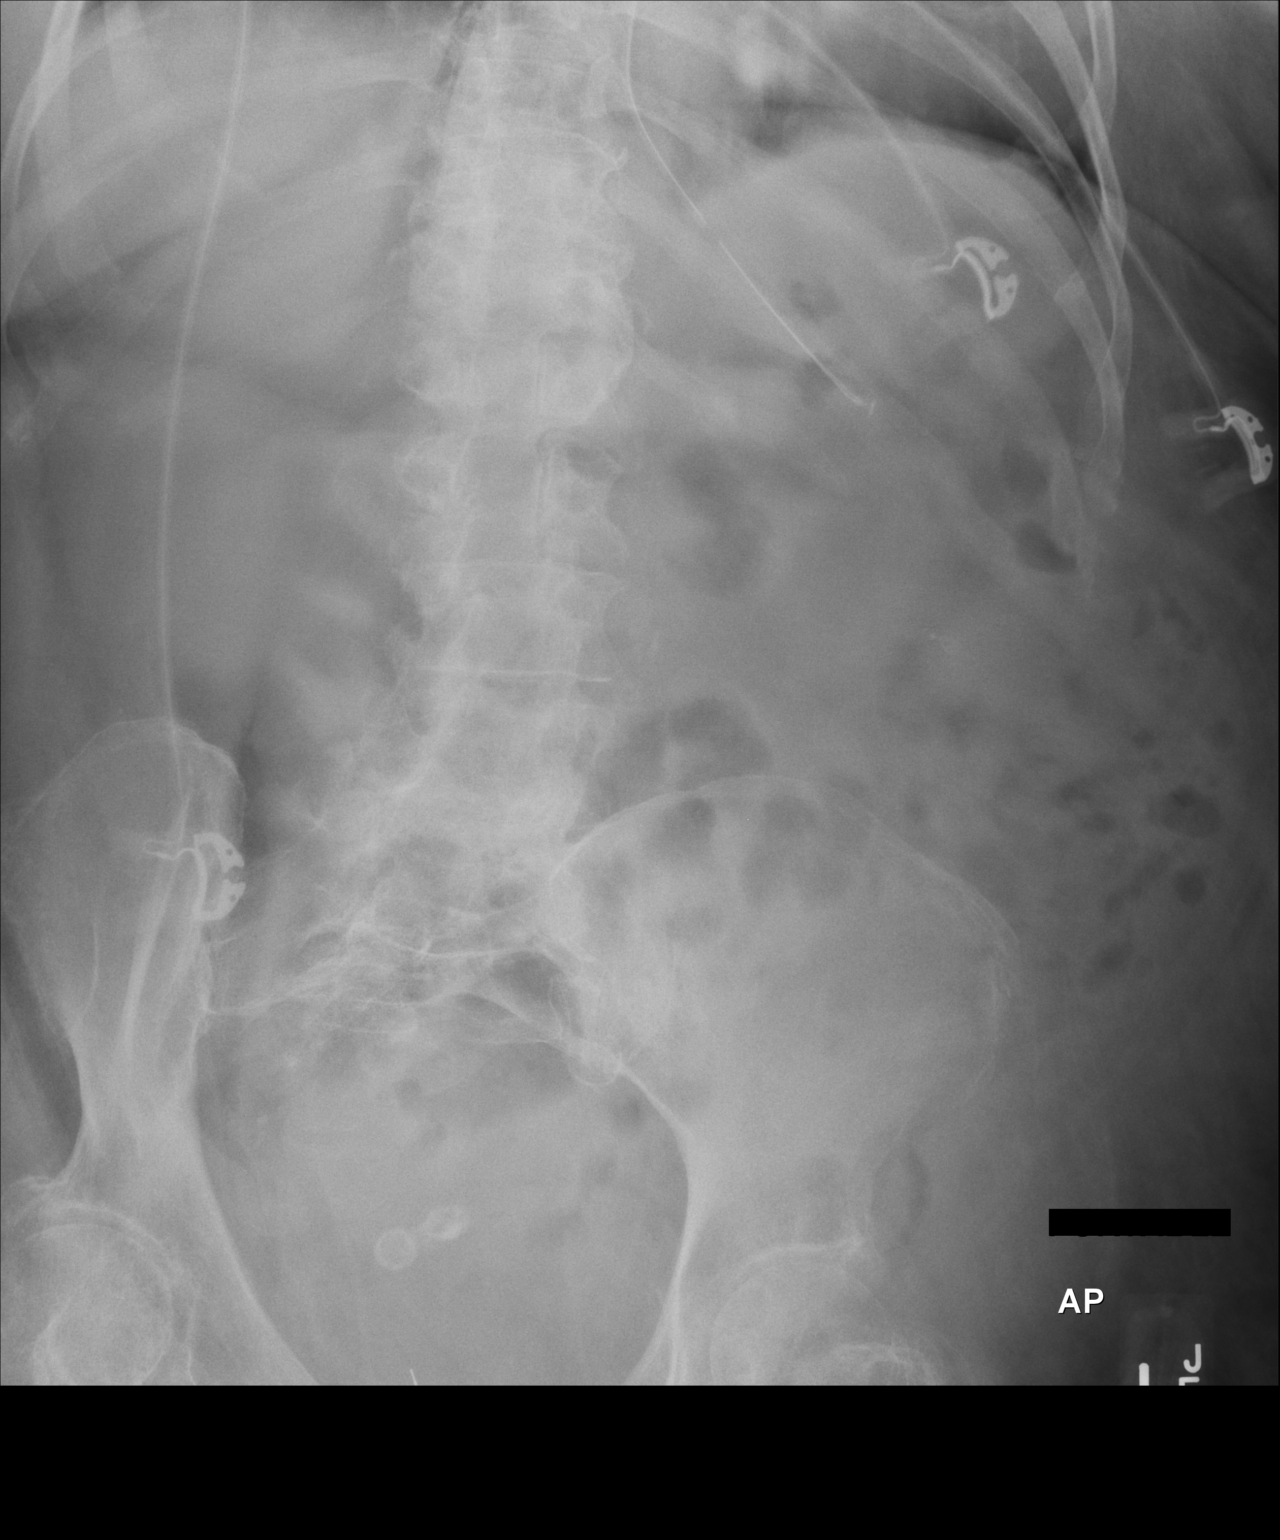

[1 of 1 positions shown; findings below may reference images not displayed]

FINDINGS: The nasogastric tube tip lies in the region of the gastric cardia
but the proximal port is at the level of the GE junction.
Advancement by at least 10 cm is recommended. The bowel gas pattern
is nonspecific. There are likely phleboliths within the pelvis.
IMPRESSION: Advancement of the nasogastric tube by approximately 10 cm is
recommended to assure that the proximal port lies below the GE
junction.

## 2015-02-15 IMAGING — CR DG CHEST 1V PORT
1 series · 1 of 1 positions shown · non-contrast
Comparison: 03/25/2013

CLINICAL DATA: Fever and tachycardia

EXAM:
PORTABLE CHEST - 1 VIEW

[view not recorded]
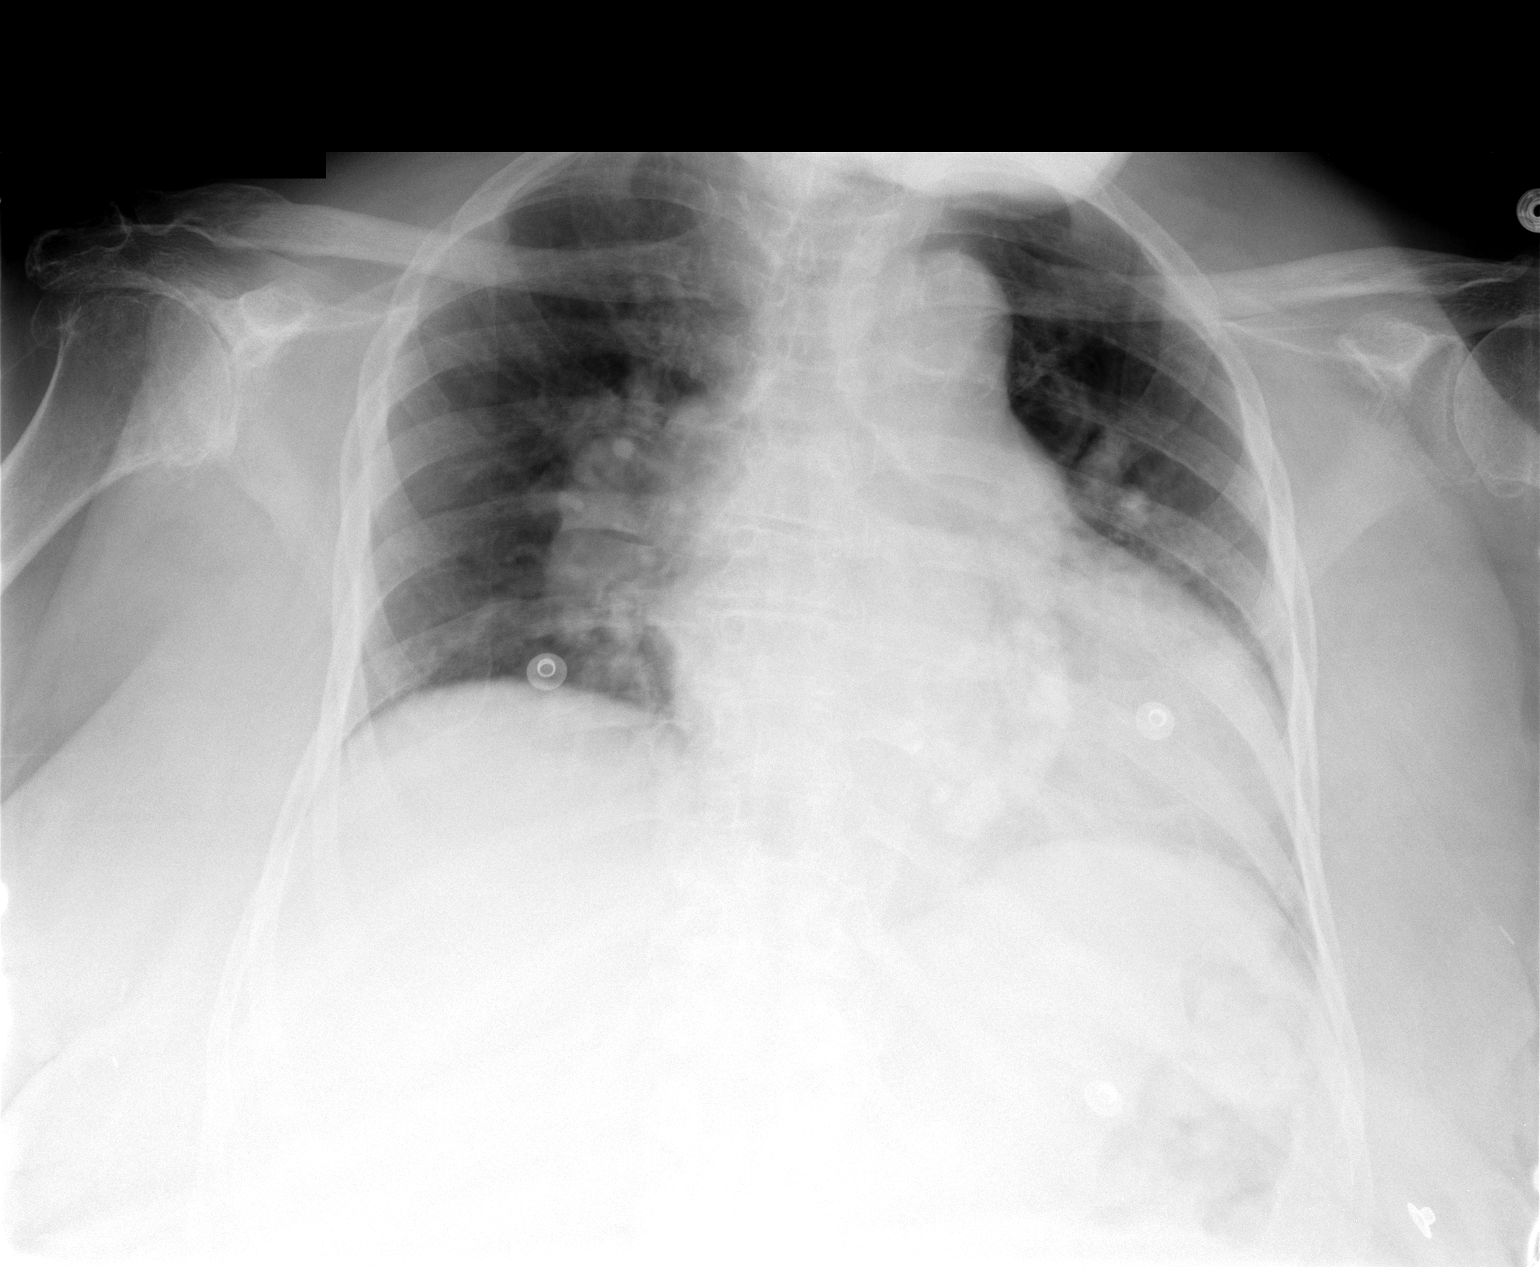

[1 of 1 positions shown; findings below may reference images not displayed]

FINDINGS: Cardiac enlargement. Pulmonary artery enlargement, stable. Negative
for heart failure. Extensive mitral annular calcification.

Negative for pneumonia or effusion.
IMPRESSION: No active disease.
